# Patient Record
Sex: Male | Born: 1960 | Race: White | Hispanic: No | Smoking: Former smoker
Health system: Southern US, Community
[De-identification: ages and names within clinical notes are randomized; demographics above are authoritative.]

## PROBLEM LIST (undated history)

## (undated) DIAGNOSIS — K420 Umbilical hernia with obstruction, without gangrene: Secondary | ICD-10-CM

## (undated) DIAGNOSIS — K219 Gastro-esophageal reflux disease without esophagitis: Secondary | ICD-10-CM

## (undated) DIAGNOSIS — I509 Heart failure, unspecified: Secondary | ICD-10-CM

## (undated) DIAGNOSIS — K5790 Diverticulosis of intestine, part unspecified, without perforation or abscess without bleeding: Secondary | ICD-10-CM

## (undated) DIAGNOSIS — I10 Essential (primary) hypertension: Secondary | ICD-10-CM

## (undated) DIAGNOSIS — K259 Gastric ulcer, unspecified as acute or chronic, without hemorrhage or perforation: Secondary | ICD-10-CM

## (undated) DIAGNOSIS — I5022 Chronic systolic (congestive) heart failure: Secondary | ICD-10-CM

## (undated) DIAGNOSIS — Z8719 Personal history of other diseases of the digestive system: Secondary | ICD-10-CM

## (undated) HISTORY — PX: TONSILLECTOMY: SUR1361

## (undated) HISTORY — PX: BACK SURGERY: SHX140

## (undated) HISTORY — DX: Umbilical hernia with obstruction, without gangrene: K42.0

---

## 2000-05-30 HISTORY — PX: HERNIA REPAIR: SHX51

## 2011-07-07 DIAGNOSIS — K219 Gastro-esophageal reflux disease without esophagitis: Secondary | ICD-10-CM | POA: Insufficient documentation

## 2011-07-07 DIAGNOSIS — M5440 Lumbago with sciatica, unspecified side: Secondary | ICD-10-CM | POA: Insufficient documentation

## 2011-07-07 DIAGNOSIS — K449 Diaphragmatic hernia without obstruction or gangrene: Secondary | ICD-10-CM | POA: Insufficient documentation

## 2011-07-08 DIAGNOSIS — I10 Essential (primary) hypertension: Secondary | ICD-10-CM | POA: Insufficient documentation

## 2014-03-03 DIAGNOSIS — K5730 Diverticulosis of large intestine without perforation or abscess without bleeding: Secondary | ICD-10-CM | POA: Insufficient documentation

## 2014-03-03 DIAGNOSIS — F1010 Alcohol abuse, uncomplicated: Secondary | ICD-10-CM | POA: Insufficient documentation

## 2014-03-03 DIAGNOSIS — G4700 Insomnia, unspecified: Secondary | ICD-10-CM | POA: Insufficient documentation

## 2014-03-31 DIAGNOSIS — N401 Enlarged prostate with lower urinary tract symptoms: Secondary | ICD-10-CM | POA: Insufficient documentation

## 2014-03-31 DIAGNOSIS — N138 Other obstructive and reflux uropathy: Secondary | ICD-10-CM | POA: Insufficient documentation

## 2014-05-06 DIAGNOSIS — E782 Mixed hyperlipidemia: Secondary | ICD-10-CM | POA: Insufficient documentation

## 2015-01-02 ENCOUNTER — Emergency Department
Admission: EM | Admit: 2015-01-02 | Discharge: 2015-01-02 | Disposition: A | Discharge status: Home / Self Care | Payer: Worker's Compensation | Attending: Emergency Medicine | Admitting: Emergency Medicine

## 2015-01-02 DIAGNOSIS — Y9389 Activity, other specified: Secondary | ICD-10-CM | POA: Diagnosis not present

## 2015-01-02 DIAGNOSIS — Y9289 Other specified places as the place of occurrence of the external cause: Secondary | ICD-10-CM | POA: Diagnosis not present

## 2015-01-02 DIAGNOSIS — Y990 Civilian activity done for income or pay: Secondary | ICD-10-CM | POA: Diagnosis not present

## 2015-01-02 DIAGNOSIS — X58XXXA Exposure to other specified factors, initial encounter: Secondary | ICD-10-CM | POA: Insufficient documentation

## 2015-01-02 DIAGNOSIS — S61422A Laceration with foreign body of left hand, initial encounter: Secondary | ICD-10-CM | POA: Insufficient documentation

## 2015-01-02 DIAGNOSIS — S61412A Laceration without foreign body of left hand, initial encounter: Secondary | ICD-10-CM

## 2015-01-02 MED ORDER — CEPHALEXIN 500 MG PO CAPS: 500.0000 mg | ORAL_CAPSULE | Freq: Two times a day (BID) | ORAL | Status: AC

## 2015-01-02 MED ORDER — BACITRACIN 500 UNIT/GM EX OINT
1.0000 "application " | TOPICAL_OINTMENT | Freq: Two times a day (BID) | CUTANEOUS | Status: DC
  Administered 2015-01-02: 1 via TOPICAL

## 2015-01-02 MED ORDER — CEPHALEXIN 500 MG PO CAPS
500.0000 mg | ORAL_CAPSULE | Freq: Once | ORAL | Status: AC
  Administered 2015-01-02: 500 mg via ORAL

## 2015-01-02 MED ORDER — CEPHALEXIN 500 MG PO CAPS
ORAL_CAPSULE | ORAL | Status: AC
  Administered 2015-01-02: 500 mg via ORAL
  Filled 2015-01-02: qty 1

## 2015-01-02 MED ORDER — OXYCODONE-ACETAMINOPHEN 5-325 MG PO TABS
ORAL_TABLET | ORAL | Status: AC
  Administered 2015-01-02: 1 via ORAL
  Filled 2015-01-02: qty 1

## 2015-01-02 MED ORDER — OXYCODONE-ACETAMINOPHEN 5-325 MG PO TABS
1.0000 | ORAL_TABLET | Freq: Once | ORAL | Status: AC
  Administered 2015-01-02: 1 via ORAL

## 2015-01-02 MED ORDER — LIDOCAINE HCL (PF) 1 % IJ SOLN
5.0000 mL | Freq: Once | INTRAMUSCULAR | Status: AC
  Administered 2015-01-02: 5 mL via INTRADERMAL

## 2015-01-02 MED ORDER — LIDOCAINE HCL (PF) 1 % IJ SOLN
INTRAMUSCULAR | Status: AC
  Administered 2015-01-02: 5 mL via INTRADERMAL
  Filled 2015-01-02: qty 5

## 2015-01-02 MED ORDER — BACITRACIN ZINC 500 UNIT/GM EX OINT
TOPICAL_OINTMENT | CUTANEOUS | Status: AC
  Administered 2015-01-02: 1 via TOPICAL
  Filled 2015-01-02: qty 0.9

## 2015-01-02 NOTE — ED Provider Notes (Signed)
[facility] Emergency Department Provider Note  ____________________________________________  Time seen: 2:00 AM  I have reviewed the triage vital signs and the nursing notes.   HISTORY  Chief Complaint Extremity Laceration      HPI Luis Porter is a 54 y.o. male resents with history of accidental laceration to the dorsal aspect of the left hand while at work. Patient states last tetanus shot was approximately 5 years ago    Past medical history None There are no active problems to display for this patient.   Past surgical history None  No current outpatient prescriptions on file.  Allergies No known drug allergies No family history on file.  Social History History  Substance Use Topics  . Smoking status: Not on file  . Smokeless tobacco: Not on file  . Alcohol Use: Not on file    Review of Systems  Constitutional: Negative for fever. Eyes: Negative for visual changes. ENT: Negative for sore throat. Cardiovascular: Negative for chest pain. Respiratory: Negative for shortness of breath. Gastrointestinal: Negative for abdominal pain, vomiting and diarrhea. Genitourinary: Negative for dysuria. Musculoskeletal: Negative for back pain. Skin: Positive for left hand laceration. Neurological: Negative for headaches, focal weakness or numbness.   10-point ROS otherwise negative.  ____________________________________________   PHYSICAL EXAM:  VITAL SIGNS: ED Triage Vitals  Enc Vitals Group     BP 01/02/15 0126 131/93 mmHg     Pulse Rate 01/02/15 0126 83     Resp 01/02/15 0126 18     Temp 01/02/15 0126 98.2 F (36.8 C)     Temp Source 01/02/15 0126 Oral     SpO2 01/02/15 0126 98 %     Weight 01/02/15 0126 150 lb (68.04 kg)     Height 01/02/15 0126 5\' 6"  (1.676 m)     Head Cir --      Peak Flow --      Pain Score 01/02/15 0127 5     Pain Loc --      Pain Edu? --      Excl. in [place]? --     Constitutional: Alert and  oriented. Well appearing and in no distress. Eyes: Conjunctivae are normal. PERRL. Normal extraocular movements. ENT   Head: Normocephalic and atraumatic.   Nose: No congestion/rhinnorhea.   Mouth/Throat: Mucous membranes are moist.   Neck: No stridor. Hematological/Lymphatic/Immunilogical: No cervical lymphadenopathy. Cardiovascular: Normal rate, regular rhythm. Normal and symmetric distal pulses are present in all extremities. No murmurs, rubs, or gallops. Respiratory: Normal respiratory effort without tachypnea nor retractions. Breath sounds are clear and equal bilaterally. No wheezes/rales/rhonchi. Gastrointestinal: Soft and nontender. No distention. There is no CVA tenderness. Genitourinary: deferred Musculoskeletal: Nontender with normal range of motion in all extremities. No joint effusions.  No lower extremity tenderness nor edema. Neurologic:  Normal speech and language. No gross focal neurologic deficits are appreciated. Speech is normal.  Skin:  2.5 cm linear laceration to the dorsal aspect of the left hand with active bleeding Psychiatric: Mood and affect are normal. Speech and behavior are normal. Patient exhibits appropriate insight and judgment.   PROCEDURES  Procedure(s) performed: LACERATION REPAIR Performed by: Gregor Hams Authorized by: Gregor Hams Consent: Verbal consent obtained. Risks and benefits: risks, benefits and alternatives were discussed Consent given by: patient Patient identity confirmed: provided demographic data Prepped and Draped in normal sterile fashion Wound explored  Laceration Location: DORSAL ASPECT OF THE LEFT HAND   Laceration Length: 2.5 cm  No Foreign Bodies seen or palpated  Anesthesia: local infiltration  Local anesthetic: lidocaine 1 %   Anesthetic total: 40ml  Irrigation method: syringe Amount of cleaning: standard  Skin closure: 5-0 nylon   Number of sutures: 4  Technique: Simple interrupted    Patient tolerance: Patient tolerated the procedure well with no immediate complications.    ____________________________________________   INITIAL IMPRESSION / ASSESSMENT AND PLAN / ED COURSE  Pertinent labs & imaging results that were available during my care of the patient were reviewed by me and considered in my medical decision making (see chart for details).    ____________________________________________   FINAL CLINICAL IMPRESSION(S) / ED DIAGNOSES  Final diagnoses:  Hand laceration, left, initial encounter      Gregor Hams, MD 01/02/15 [phone]

## 2015-01-02 NOTE — ED Notes (Signed)
Workman's comp completed by following employer workman's comp profile, urine specimen placed in [facility] basket for pickup.

## 2015-01-02 NOTE — ED Notes (Signed)
Pt to ED from work c/o laceration to dorsal side of L hand. Pt presents with bandage to affected area. Pt is currently applying pressure to prevent further bleeding. Last tetanus was approximately 5 years ago.

## 2015-01-02 NOTE — Discharge Instructions (Signed)
Laceration Care, Adult A laceration is a cut that goes through all layers of the skin. The cut goes into the tissue beneath the skin. HOME CARE For stitches (sutures) or staples:  Keep the cut clean and dry.  If you have a bandage (dressing), change it at least once a day. Change the bandage if it gets wet or dirty, or as told by your doctor.  Wash the cut with soap and water 2 times a day. Rinse the cut with water. Pat it dry with a clean towel.  Put a thin layer of medicated cream on the cut as told by your doctor.  You may shower after the first 24 hours. Do not soak the cut in water until the stitches are removed.  Only take medicines as told by your doctor.  Have your stitches or staples removed as told by your doctor. For skin adhesive strips:  Keep the cut clean and dry.  Do not get the strips wet. You may take a bath, but be careful to keep the cut dry.  If the cut gets wet, pat it dry with a clean towel.  The strips will fall off on their own. Do not remove the strips that are still stuck to the cut. For wound glue:  You may shower or take baths. Do not soak or scrub the cut. Do not swim. Avoid heavy sweating until the glue falls off on its own. After a shower or bath, pat the cut dry with a clean towel.  Do not put medicine on your cut until the glue falls off.  If you have a bandage, do not put tape over the glue.  Avoid lots of sunlight or tanning lamps until the glue falls off. Put sunscreen on the cut for the first year to reduce your scar.  The glue will fall off on its own. Do not pick at the glue. You may need a tetanus shot if:  You cannot remember when you had your last tetanus shot.  You have never had a tetanus shot. If you need a tetanus shot and you choose not to have one, you may get tetanus. Sickness from tetanus can be serious. GET HELP RIGHT AWAY IF:   Your pain does not get better with medicine.  Your arm, hand, leg, or foot loses feeling  (numbness) or changes color.  Your cut is bleeding.  Your joint feels weak, or you cannot use your joint.  You have painful lumps on your body.  Your cut is red, puffy (swollen), or painful.  You have a red line on the skin near the cut.  You have yellowish-white fluid (pus) coming from the cut.  You have a fever.  You have a bad smell coming from the cut or bandage.  Your cut breaks open before or after stitches are removed.  You notice something coming out of the cut, such as wood or glass.  You cannot move a finger or toe. MAKE SURE YOU:   Understand these instructions.  Will watch your condition.  Will get help right away if you are not doing well or get worse. Document Released: 11/02/2007 Document Revised: 08/08/2011 Document Reviewed: 11/09/2010 [facility] Patient Information 2015 [place], [place]. This information is not intended to replace advice given to you by your health care provider. Make sure you discuss any questions you have with your health care provider. Please have sutures removed in 7 days

## 2015-01-02 NOTE — ED Notes (Signed)
Ice pack applied for 39mins then will drsg and apply bacitrican

## 2016-03-06 ENCOUNTER — Emergency Department
Admission: EM | Admit: 2016-03-06 | Discharge: 2016-03-06 | Disposition: A | Discharge status: Home / Self Care | Payer: Managed Care, Other (non HMO) | Attending: Emergency Medicine | Admitting: Emergency Medicine

## 2016-03-06 ENCOUNTER — Encounter: Payer: Self-pay | Admitting: Emergency Medicine

## 2016-03-06 DIAGNOSIS — I10 Essential (primary) hypertension: Secondary | ICD-10-CM | POA: Insufficient documentation

## 2016-03-06 DIAGNOSIS — M5431 Sciatica, right side: Secondary | ICD-10-CM

## 2016-03-06 DIAGNOSIS — M5441 Lumbago with sciatica, right side: Secondary | ICD-10-CM | POA: Insufficient documentation

## 2016-03-06 DIAGNOSIS — M79661 Pain in right lower leg: Secondary | ICD-10-CM | POA: Diagnosis present

## 2016-03-06 HISTORY — DX: Essential (primary) hypertension: I10

## 2016-03-06 MED ORDER — PREDNISONE 10 MG PO TABS: ORAL_TABLET | ORAL | 0 refills | Status: DC

## 2016-03-06 MED ORDER — HYDROCODONE-ACETAMINOPHEN 5-325 MG PO TABS: 1.0000 | ORAL_TABLET | ORAL | 0 refills | Status: DC | PRN

## 2016-03-06 NOTE — ED Provider Notes (Signed)
[facility] Emergency Department Provider Note  ____________________________________________   First MD Initiated Contact with Patient 03/06/16 [phone]     (approximate)  I have reviewed the triage vital signs and the nursing notes.   HISTORY  Chief Complaint Leg Pain   HPI Luis Porter is a 55 y.o. male comes in today with complaint of right leg pain. Patient states that he has pain shooting from his lower back into his right hip and then down his right leg. Patient continues to be ambulatory but states that the pain is getting worse. He states that he has experienced something like this before. He denies any urinary symptoms, incontinence of bowel or bladder, or saddle paresthesias. Currently patient is not taking any over-the-counter medication for his back pain. He rates his pain as a 10 over 10 at this time.   Past Medical History:  Diagnosis Date  . Hypertension     There are no active problems to display for this patient.   Past Surgical History:  Procedure Laterality Date  . HERNIA REPAIR      Prior to Admission medications   Medication Sig Start Date End Date Taking? Authorizing Provider  HYDROcodone-acetaminophen (NORCO/VICODIN) 5-325 MG tablet Take 1 tablet by mouth every 4 (four) hours as needed for moderate pain. 03/06/16   Johnn Hai, PA-C  predniSONE (DELTASONE) 10 MG tablet Take 6 tablets  today, on day 2 take 5 tablets, day 3 take 4 tablets, day 4 take 3 tablets, day 5 take  2 tablets and 1 tablet the last day 03/06/16   Johnn Hai, PA-C    Allergies Review of patient's allergies indicates no known allergies.  No family history on file.  Social History Social History  Substance Use Topics  . Smoking status: Not on file  . Smokeless tobacco: Not on file  . Alcohol use Not on file    Review of Systems Constitutional: No fever/chills Cardiovascular: Denies chest pain. Respiratory: Denies shortness of  breath. Gastrointestinal: No abdominal pain.  No nausea, no vomiting.   Genitourinary: Negative for dysuria. Musculoskeletal: Positive low back pain, right hip pain, right leg discomfort. Skin: Negative for rash. Neurological: Negative for headaches, focal weakness. Positive for radiculopathy right leg.  10-point ROS otherwise negative.  ____________________________________________   PHYSICAL EXAM:  VITAL SIGNS: ED Triage Vitals [03/06/16 0743]  Enc Vitals Group     BP (!) 166/85     Pulse Rate (!) 120     Resp 18     Temp 98 F (36.7 C)     Temp Source Oral     SpO2 100 %     Weight 150 lb (68 kg)     Height 5\' 6"  (1.676 m)     Head Circumference      Peak Flow      Pain Score 10     Pain Loc      Pain Edu?      Excl. in [place]?     Constitutional: Alert and oriented. Well appearing and in no acute distress. Eyes: Conjunctivae are normal. PERRL. EOMI. Head: Atraumatic. Nose: No congestion/rhinnorhea. Neck: No stridor.   Cardiovascular: Normal rate, regular rhythm. Grossly normal heart sounds.  Good peripheral circulation. Respiratory: Normal respiratory effort.  No retractions. Lungs CTAB. Gastrointestinal: Soft and nontender. No distention.  Musculoskeletal: On examination of the back there is no gross deformity. There is no tenderness  of the thorax or lumbosacral spine to palpation. There is no restriction  on range of motion. There is no muscle spasm seen. Straight leg raises were slightly decreased on the right leg secondary to patient's pain. Muscle strength is equal bilaterally. Neurologic:  Normal speech and language. No gross focal neurologic deficits are appreciated. No gait instability. 2+ bilaterally. Skin:  Skin is warm, dry and intact. No rash noted. Psychiatric: Mood and affect are normal. Speech and behavior are normal.  ____________________________________________   LABS (all labs ordered are listed, but only abnormal results are displayed)  Labs  Reviewed - No data to display   PROCEDURES  Procedure(s) performed: None  Procedures  Critical Care performed: No  ____________________________________________   INITIAL IMPRESSION / ASSESSMENT AND PLAN / ED COURSE  Pertinent labs & imaging results that were available during my care of the patient were reviewed by me and considered in my medical decision making (see chart for details).    Clinical Course  Patient was started on prednisone 60 mg for 6 day taper. Patient is follow-up with [facility] primary if any continued problems and further evaluation of his back.   ____________________________________________   FINAL CLINICAL IMPRESSION(S) / ED DIAGNOSES  Final diagnoses:  Sciatica of right side      NEW MEDICATIONS STARTED DURING THIS VISIT:  Discharge Medication List as of 03/06/2016  8:08 AM    START taking these medications   Details  predniSONE (DELTASONE) 10 MG tablet Take 6 tablets  today, on day 2 take 5 tablets, day 3 take 4 tablets, day 4 take 3 tablets, day 5 take  2 tablets and 1 tablet the last day, Print         Note:  This document was prepared using Dragon voice recognition software and may include unintentional dictation errors.    Johnn Hai, PA-C 03/06/16 Dawn Malinda, MD 03/10/16 [phone]

## 2016-03-06 NOTE — ED Notes (Signed)
NAD noted at time of D/C. Pt denies questions or concerns. Pt ambulatory to the lobby at this time. This RN discussed with patient no drinking, or driving while on the narcotic pain medication, pt states understanding at this time.

## 2016-03-06 NOTE — ED Triage Notes (Signed)
Pt presents to ED with reports of right leg pain that starts at the hip and shoots down his leg. Pt ambulated to triage without difficulty.

## 2016-03-06 NOTE — Discharge Instructions (Signed)
Begin taking prednisone daily as directed. You may also take Tylenol with this medication for pain. Follow-up with your primary care doctor or Dr. Rudene Christians if any continued problems.

## 2016-03-29 ENCOUNTER — Other Ambulatory Visit: Payer: Self-pay | Admitting: Orthopedic Surgery

## 2016-03-29 DIAGNOSIS — M5440 Lumbago with sciatica, unspecified side: Secondary | ICD-10-CM

## 2016-04-08 ENCOUNTER — Ambulatory Visit
Admission: RE | Admit: 2016-04-08 | Discharge: 2016-04-08 | Disposition: A | Discharge status: Home / Self Care | Payer: Managed Care, Other (non HMO) | Source: Ambulatory Visit | Attending: Orthopedic Surgery | Admitting: Orthopedic Surgery

## 2016-04-08 DIAGNOSIS — M5440 Lumbago with sciatica, unspecified side: Secondary | ICD-10-CM

## 2016-04-12 ENCOUNTER — Ambulatory Visit
Admission: RE | Admit: 2016-04-12 | Discharge: 2016-04-12 | Disposition: A | Discharge status: Home / Self Care | Payer: Managed Care, Other (non HMO) | Source: Ambulatory Visit | Attending: Orthopedic Surgery | Admitting: Orthopedic Surgery

## 2016-04-12 DIAGNOSIS — M5417 Radiculopathy, lumbosacral region: Secondary | ICD-10-CM | POA: Insufficient documentation

## 2016-04-12 DIAGNOSIS — M5440 Lumbago with sciatica, unspecified side: Secondary | ICD-10-CM | POA: Insufficient documentation

## 2016-04-12 DIAGNOSIS — M48061 Spinal stenosis, lumbar region without neurogenic claudication: Secondary | ICD-10-CM | POA: Diagnosis not present

## 2016-04-12 DIAGNOSIS — M5126 Other intervertebral disc displacement, lumbar region: Secondary | ICD-10-CM | POA: Diagnosis not present

## 2016-04-12 DIAGNOSIS — M2578 Osteophyte, vertebrae: Secondary | ICD-10-CM | POA: Diagnosis not present

## 2016-04-12 DIAGNOSIS — M5136 Other intervertebral disc degeneration, lumbar region: Secondary | ICD-10-CM | POA: Insufficient documentation

## 2016-04-12 IMAGING — MR MR LUMBAR SPINE W/O CM
5 series · 35 of 48 positions shown · non-contrast
Comparison: None.

CLINICAL DATA: 55-year-old male with pain for 2 months radiating to
right leg and great toe. Numbness medial foot. Initial encounter.

EXAM:
MRI LUMBAR SPINE WITHOUT CONTRAST
TECHNIQUE: Multiplanar, multisequence MR imaging of the lumbar spine was
performed. No intravenous contrast was administered.

[Series 2: T2 · sagittal · 4.0mm · 0.81mm/px · 6 of 17 slices shown (1 of 2)]
[im 1/17]
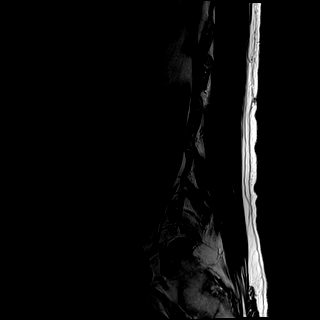
[im 4/17]
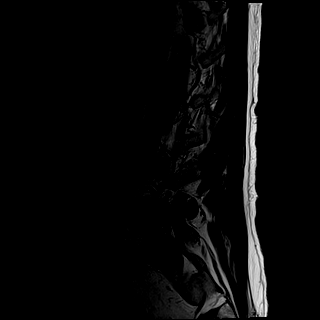
[im 7/17]
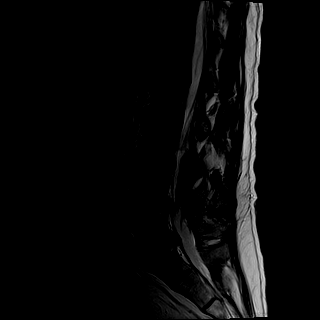
[im 10/17]
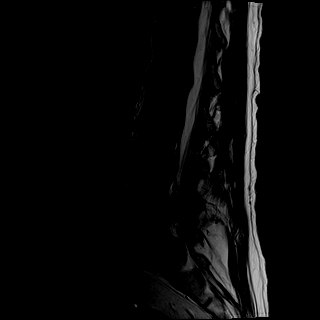
[im 13/17]
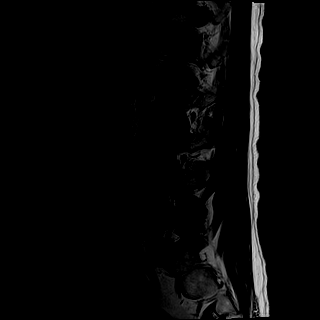
[im 17/17]
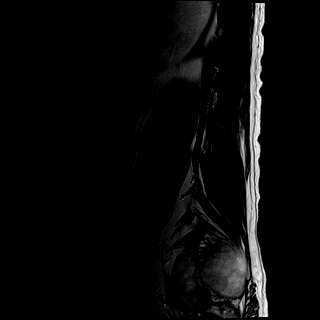

[Series 3: T1 · sagittal · 4.0mm · 0.81mm/px · 6 of 17 slices shown (1 of 2)]
[im 1/17]
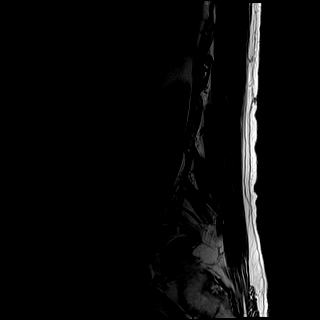
[im 4/17]
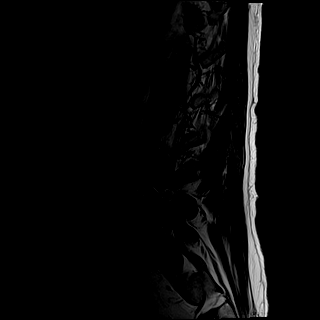
[im 7/17]
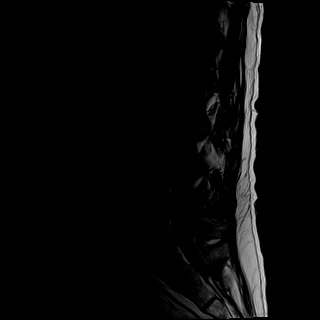
[im 10/17]
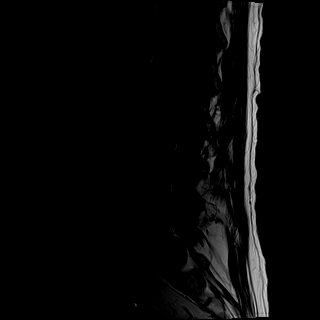
[im 13/17]
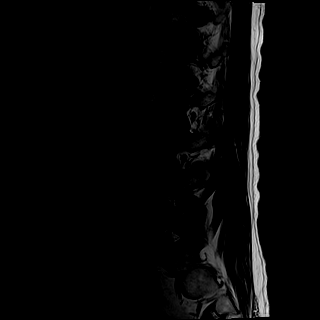
[im 17/17]
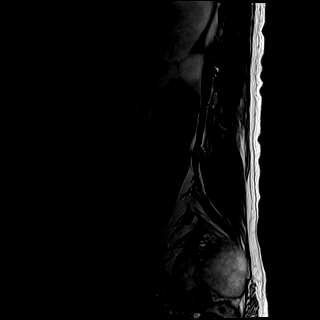

[Series 4: STIR · sagittal · 4.0mm · 1.02mm/px · 5 of 17 slices shown]
[im 1/17]
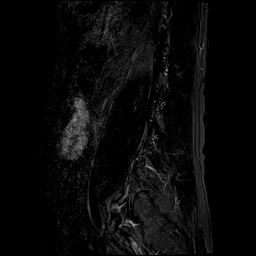
[im 4/17]
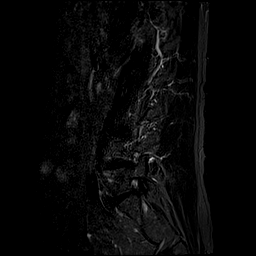
[im 7/17]
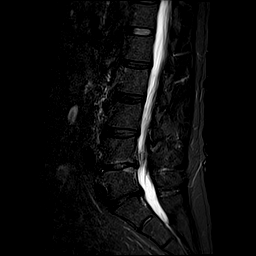
[im 10/17]
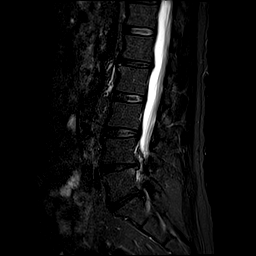
[im 13/17]
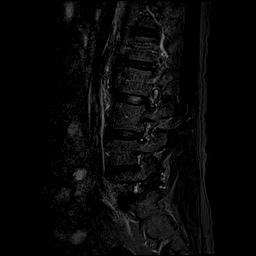

[Series 5: T2 · axial · 4.0mm · 0.78mm/px · z∈[-63,+149]mm · 9 of 39 slices shown (2 of 2)]
[im 1/39]
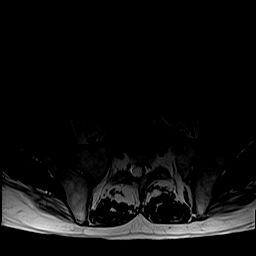
[im 6/39]
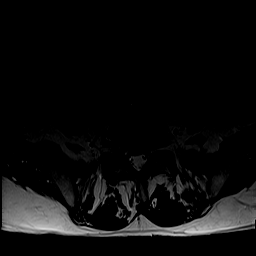
[im 11/39]
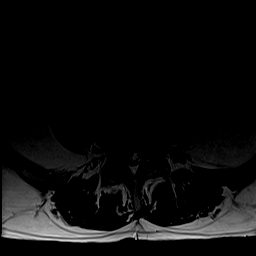
[im 17/39]
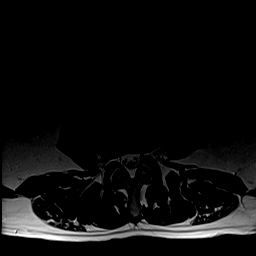
[im 20/39]
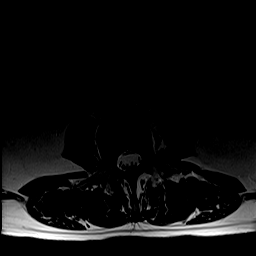
[im 22/39]
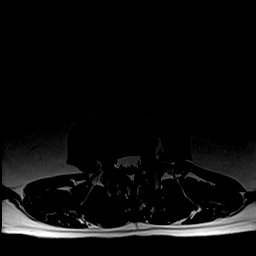
[im 28/39]
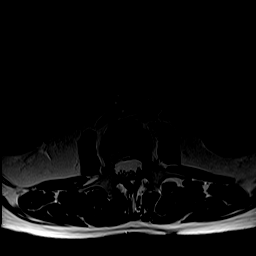
[im 33/39]
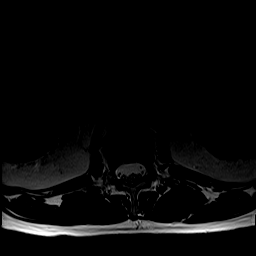
[im 39/39]
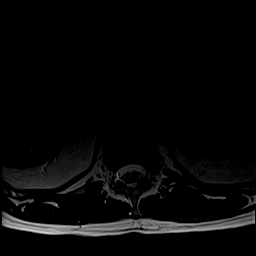

[Series 6: T1 · axial · 4.0mm · 0.39mm/px · z∈[-63,+149]mm · 9 of 39 slices shown (2 of 2)]
[im 1/39]
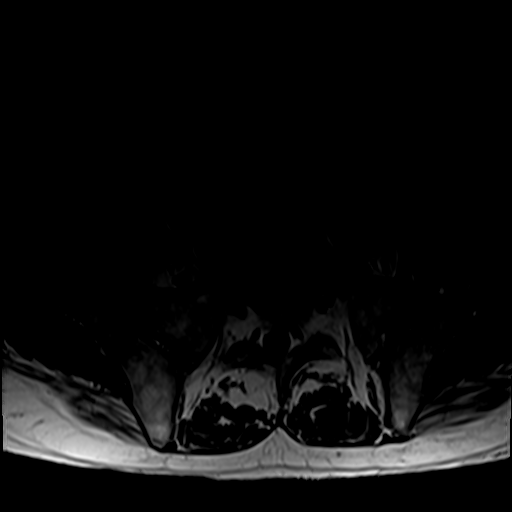
[im 6/39]
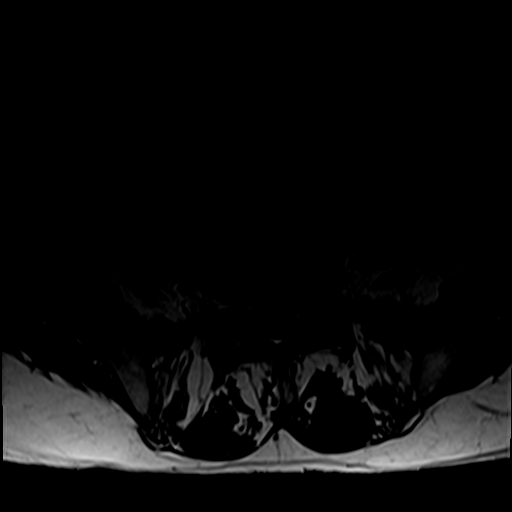
[im 11/39]
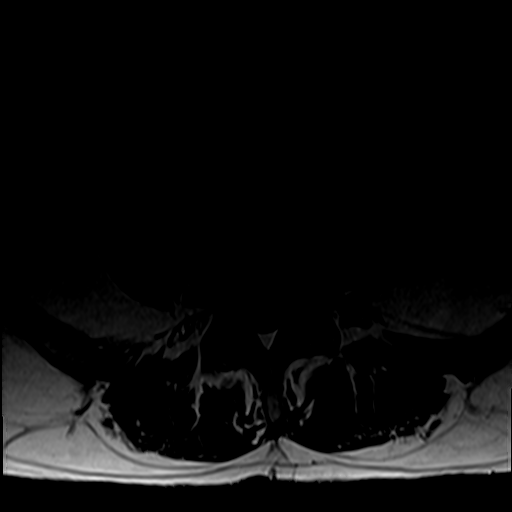
[im 17/39]
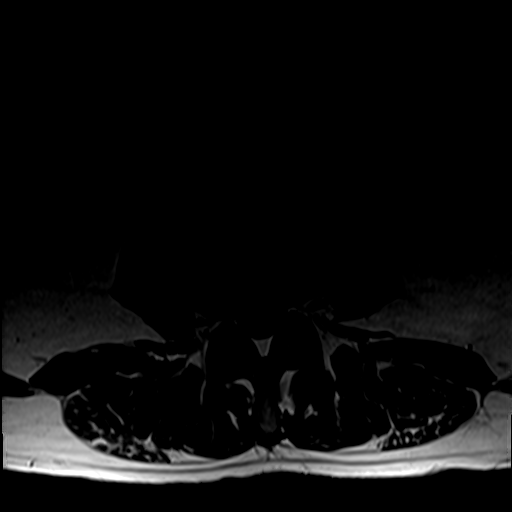
[im 20/39]
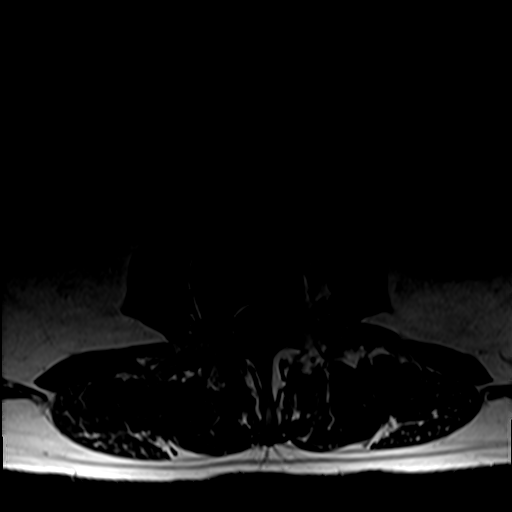
[im 22/39]
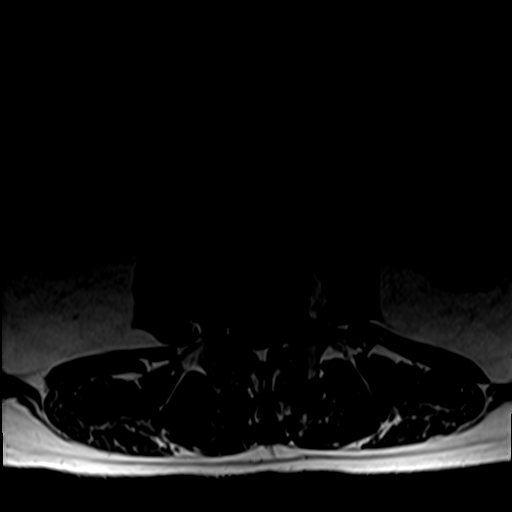
[im 28/39]
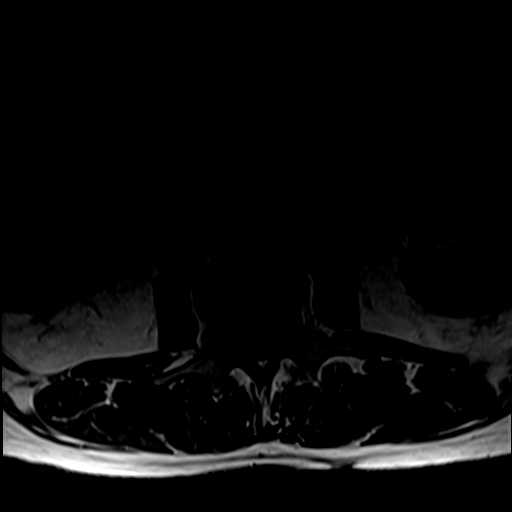
[im 33/39]
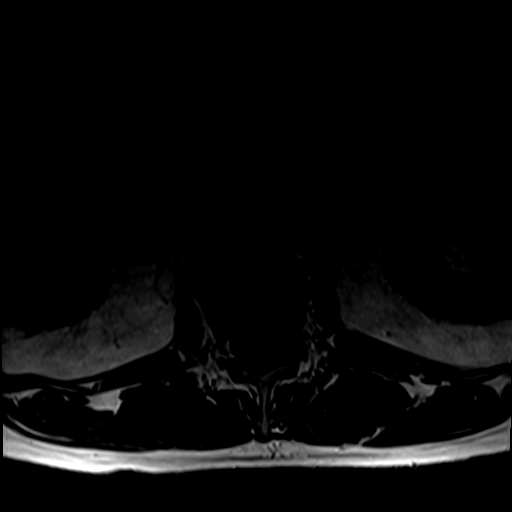
[im 39/39]
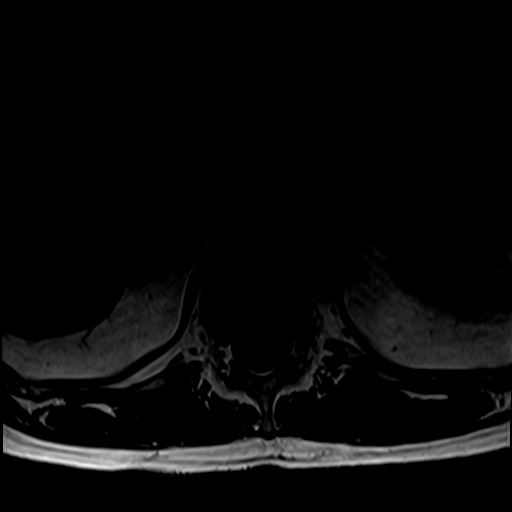

[35 of 48 positions shown; findings below may reference images not displayed]

FINDINGS: Segmentation: Last fully open disk space is labeled L5-S1. Present
examination incorporates from T12 through S3.

Alignment:  Minimal curvature.

Vertebrae:  Endplate reactive changes L4-5 and anterior superior L2.

Conus medullaris: Extends to the L1 level and appears normal.

Paraspinal and other soft tissues: No worrisome abnormality.

Disc levels:

T12-L1:  Negative.

L1-2:  Minimal bulge.  Mild facet degenerative changes.

L2-3: Right lateral shallow protrusion. Mild encroachment upon but
not compression of the exiting right L2 nerve root. Mild facet
degenerative changes.

L3-4:  Mild bulge.  Mild facet degenerative changes.

L4-5: Moderate facet degenerative changes. Disc degeneration with
endplate reactive changes. Dorsal epidural fat. Baseline mild spinal
stenosis. Superimposed large complex right paracentral extruded disc
fragment with predominately caudal extension. Minimal cephalad
extension. Compression of the right lateral aspect of the thecal sac
and contained nerve roots. This reaches the lower L5 pedicle level.

L5-S1: Disc degeneration with broad-based shallow protrusion and
osteophyte having greatest extension right paracentral position with
mild mass effect upon the upper right S1 nerve root and slight
flattening right ventral thecal sac. Minimal contact with the upper
left S1 nerve root. Facet degenerative changes greater on the right.
IMPRESSION: L4-5 baseline multifactorial mild spinal stenosis. Superimposed
large complex right paracentral extruded disc fragment with
predominately caudal extension. Minimal cephalad extension.
Compression of the right lateral aspect of the thecal sac and
contained nerve roots. This reaches the lower L5 pedicle level.

L5-S1 broad-based shallow protrusion and osteophyte having greatest
extension right paracentral position with mild mass effect upon the
upper right S1 nerve root and slight flattening right ventral thecal
sac. Minimal contact with the upper left S1 nerve root.

L2-3 right lateral shallow protrusion. Mild encroachment upon but
not compression of the exiting right L2 nerve root.

These results will be called to the ordering clinician or
representative by the Radiologist Assistant, and communication
documented in the PACS or zVision Dashboard.

## 2016-05-11 ENCOUNTER — Other Ambulatory Visit: Payer: Self-pay

## 2016-05-11 ENCOUNTER — Ambulatory Visit
Admission: RE | Admit: 2016-05-11 | Discharge: 2016-05-11 | Disposition: A | Discharge status: Home / Self Care | Payer: Managed Care, Other (non HMO) | Source: Ambulatory Visit | Attending: Neurological Surgery | Admitting: Neurological Surgery

## 2016-05-11 ENCOUNTER — Encounter
Admission: RE | Admit: 2016-05-11 | Discharge: 2016-05-11 | Disposition: A | Discharge status: Home / Self Care | Payer: Managed Care, Other (non HMO) | Source: Ambulatory Visit | Attending: Neurological Surgery | Admitting: Neurological Surgery

## 2016-05-11 DIAGNOSIS — I10 Essential (primary) hypertension: Secondary | ICD-10-CM

## 2016-05-11 HISTORY — DX: Diverticulosis of intestine, part unspecified, without perforation or abscess without bleeding: K57.90

## 2016-05-11 HISTORY — DX: Gastro-esophageal reflux disease without esophagitis: K21.9

## 2016-05-11 HISTORY — DX: Gastric ulcer, unspecified as acute or chronic, without hemorrhage or perforation: K25.9

## 2016-05-11 HISTORY — DX: Personal history of other diseases of the digestive system: Z87.19

## 2016-05-11 LAB — SURGICAL PCR SCREEN
MRSA, PCR: NEGATIVE
Staphylococcus aureus: NEGATIVE

## 2016-05-11 LAB — BASIC METABOLIC PANEL
Anion gap: 8 (ref 5–15)
BUN: 8 mg/dL (ref 6–20)
CO2: 28 mmol/L (ref 22–32)
Calcium: 9.3 mg/dL (ref 8.9–10.3)
Chloride: 102 mmol/L (ref 101–111)
Creatinine, Ser: 0.7 mg/dL (ref 0.61–1.24)
GFR calc Af Amer: >60 mL/min (ref >60)
GFR calc non Af Amer: >60 mL/min (ref >60)
Glucose, Bld: 90 mg/dL (ref 65–99)
Potassium: 3.2 mmol/L — ABNORMAL LOW (ref 3.5–5.1)
Sodium: 138 mmol/L (ref 135–145)

## 2016-05-11 LAB — URINALYSIS, ROUTINE W REFLEX MICROSCOPIC
Bacteria, UA: NONE SEEN
Bilirubin Urine: NEGATIVE
Glucose, UA: NEGATIVE mg/dL
Ketones, ur: NEGATIVE mg/dL
Leukocytes, UA: NEGATIVE
Nitrite: NEGATIVE
Protein, ur: NEGATIVE mg/dL
Specific Gravity, Urine: 1.015 (ref 1.005–1.030)
Squamous Epithelial / LPF: NONE SEEN
pH: 6 (ref 5.0–8.0)

## 2016-05-11 LAB — CBC
HCT: 47.3 % (ref 40.0–52.0)
Hemoglobin: 16.1 g/dL (ref 13.0–18.0)
MCH: 33 pg (ref 26.0–34.0)
MCHC: 34.1 g/dL (ref 32.0–36.0)
MCV: 96.6 fL (ref 80.0–100.0)
Platelets: 305 10*3/uL (ref 150–440)
RBC: 4.89 MIL/uL (ref 4.40–5.90)
RDW: 14 % (ref 11.5–14.5)
WBC: 12.8 10*3/uL — ABNORMAL HIGH (ref 3.8–10.6)

## 2016-05-11 LAB — PROTIME-INR
INR: 0.87
Prothrombin Time: 11.8 seconds (ref 11.4–15.2)

## 2016-05-11 LAB — APTT: aPTT: 25 seconds (ref 24–36)

## 2016-05-11 IMAGING — CR DG CHEST 2V
1 series · 2 of 2 positions shown · non-contrast
Comparison: None.

CLINICAL DATA: Preop for lumbar spine surgery in 1 week, former
smoking history

EXAM:
CHEST  2 VIEW

[Series 1: w chest pa · 0.14mm/px · 2 of 2 slices shown]
[im 1/2]
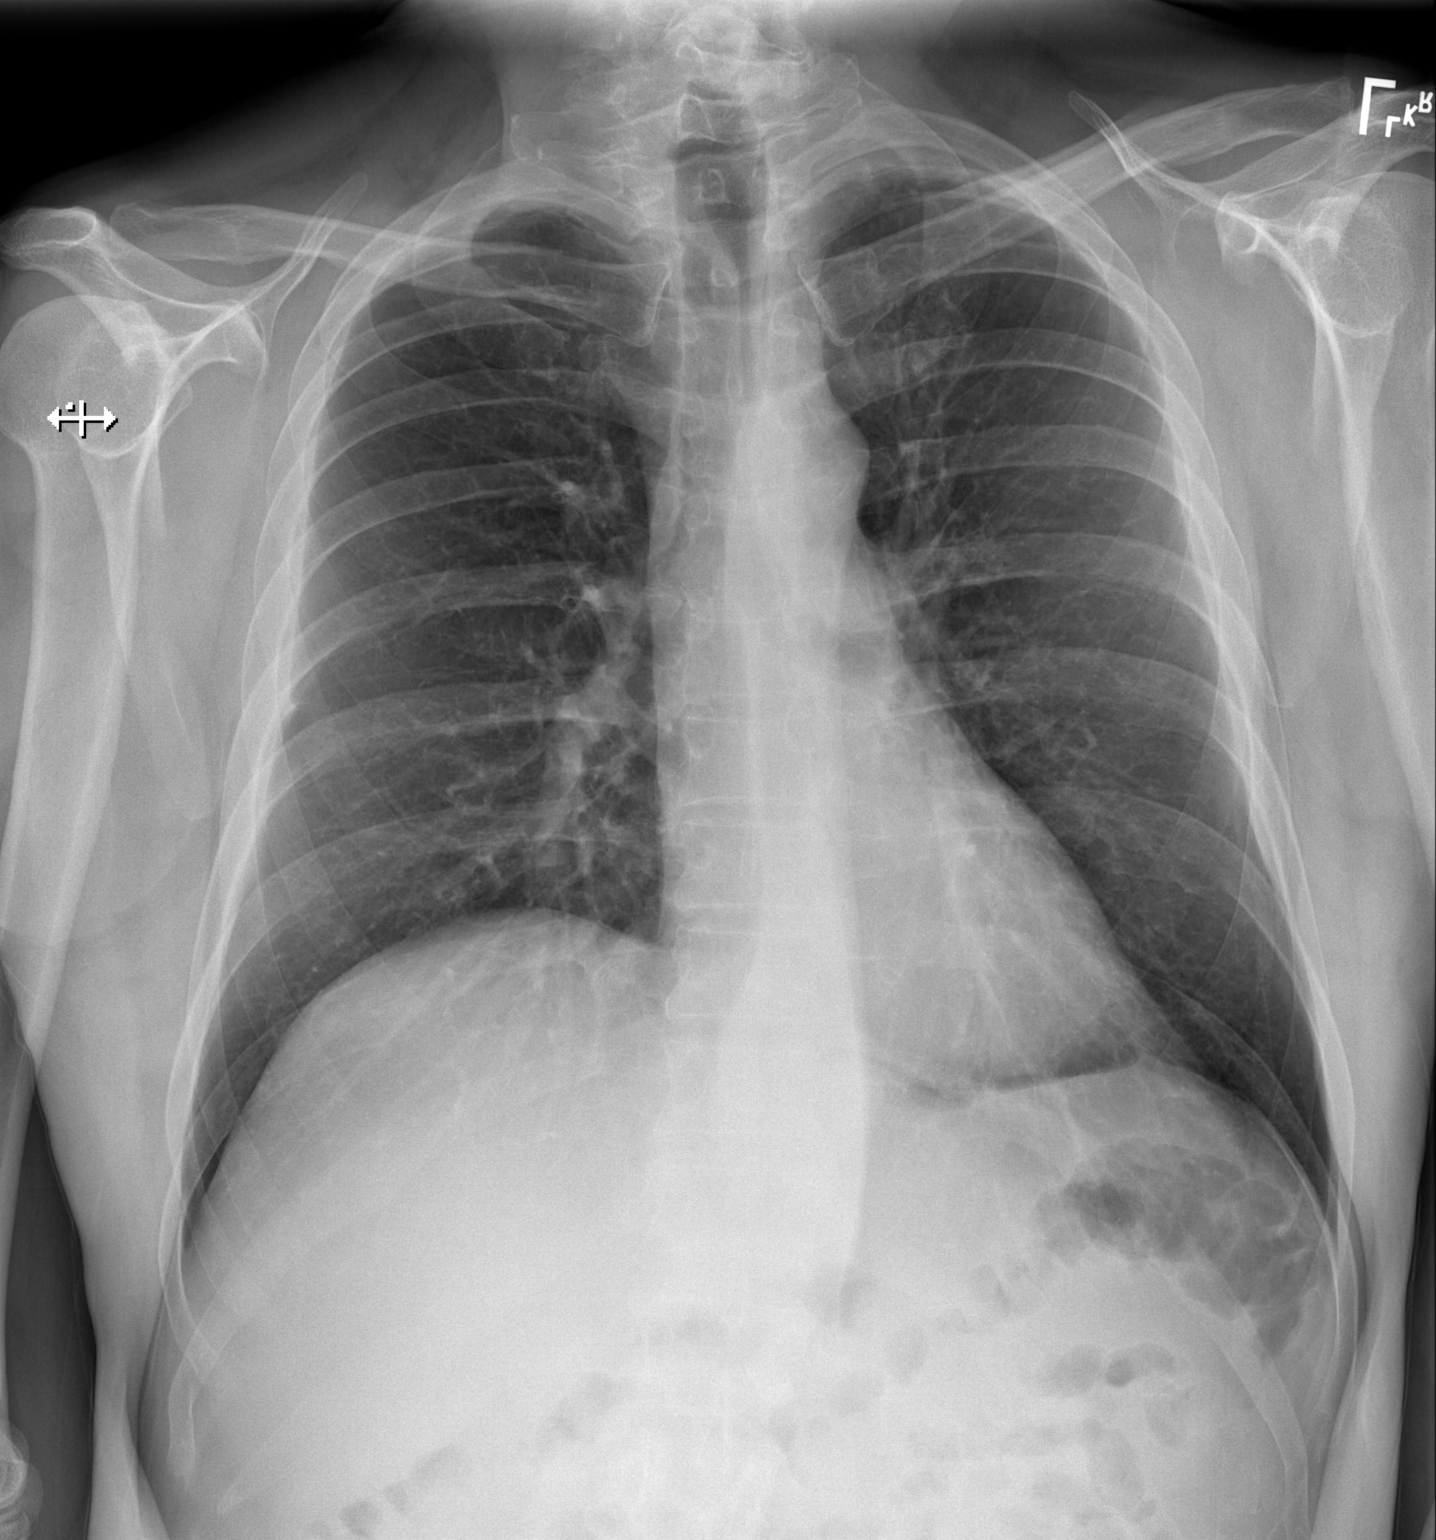
[im 2/2]
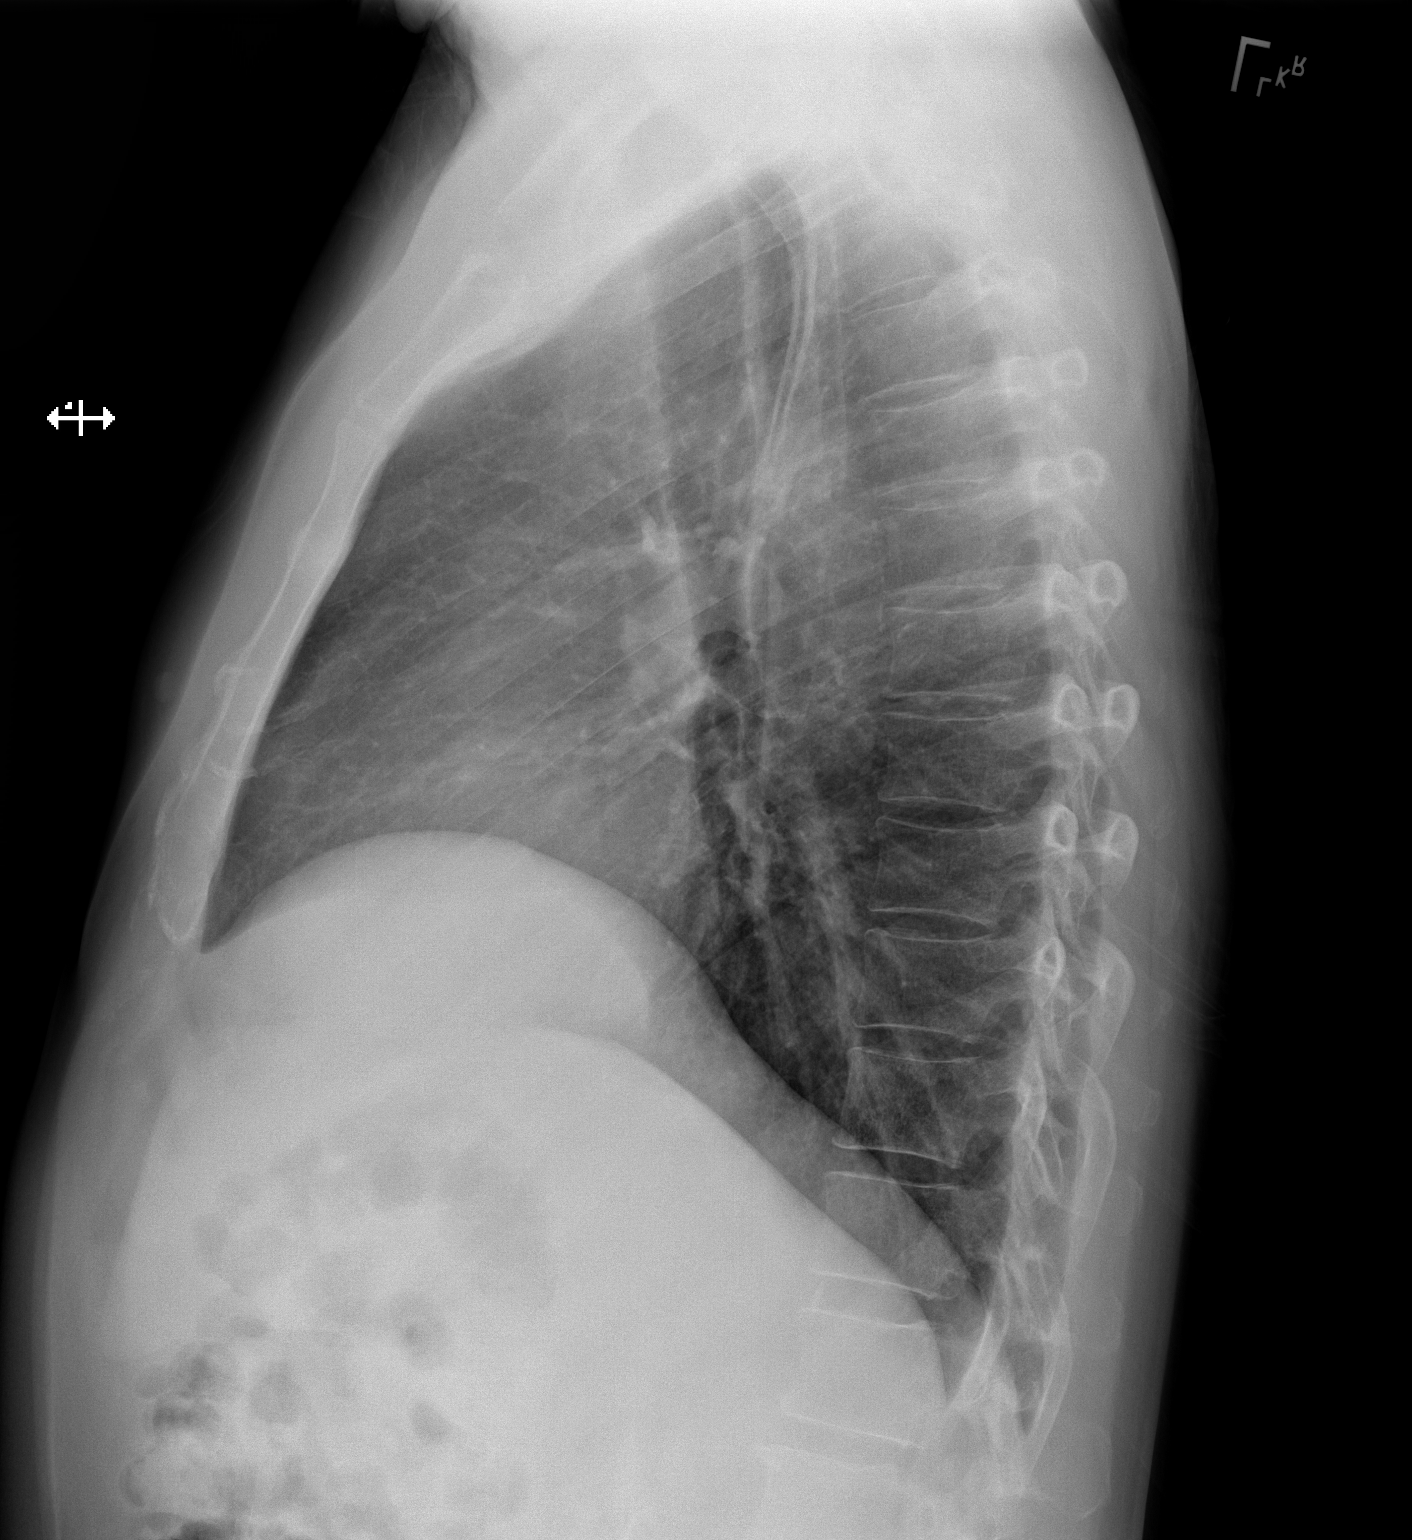

[2 of 2 positions shown; findings below may reference images not displayed]

FINDINGS: No active infiltrate or effusion is seen. There is mild
peribronchial thickening present. Mediastinal and hilar contours are
unremarkable. The heart is within normal limits in size. No bony
abnormality is seen.
IMPRESSION: No active cardiopulmonary disease.

## 2016-05-11 NOTE — Pre-Procedure Instructions (Signed)
Potassium results faxed and called to Dr. Dava Najjar office, left message with Genevie Cheshire

## 2016-05-11 NOTE — Patient Instructions (Signed)
  Your procedure is scheduled on: May 18, 2016 (Wednesday) Report to Same Day Surgery 2nd floor medical mall [facility] Entrance-take elevator on left to 2nd floor.  Check in with surgery information desk.) To find out your arrival time please call [phone] between 1PM - 3PM on  May 17, 2016 (Tuesday)  Remember: Instructions that are not followed completely may result in serious medical risk, up to and including death, or upon the discretion of your surgeon and anesthesiologist your surgery may need to be rescheduled.    _x___ 1. Do not eat food or drink liquids after midnight. No gum chewing or hard candies.     __x__ 2. No Alcohol for 24 hours before or after surgery.   __x__3. No Smoking for 24 prior to surgery.   ____  4. Bring all medications with you on the day of surgery if instructed.    __x__ 5. Notify your doctor if there is any change in your medical condition     (cold, fever, infections).     Do not wear jewelry, make-up, hairpins, clips or nail polish.  Do not wear lotions, powders, or perfumes. You may wear deodorant.  Do not shave 48 hours prior to surgery. Men may shave face and neck.  Do not bring valuables to the hospital.    [facility] is not responsible for any belongings or valuables.               Contacts, dentures or bridgework may not be worn into surgery.  Leave your suitcase in the car. After surgery it may be brought to your room.  For patients admitted to the hospital, discharge time is determined by your treatment team.   Patients discharged the day of surgery will not be allowed to drive home.  You will need someone to drive you home and stay with you the night of your procedure.    Please read over the following fact sheets that you were given:   [facility] Preparing for Surgery and or MRSA Information   _x___ Take these medicines the morning of surgery with A SIP OF WATER:    1.  Amlodipine-Benazepril  2.   Omeprazole  3.  4.  5.  6.  ____Fleets enema or Magnesium Citrate as directed.   _x___ Use CHG Soap or sage wipes as directed on instruction sheet   ____ Use inhalers on the day of surgery and bring to hospital day of surgery  ____ Stop metformin 2 days prior to surgery    ____ Take 1/2 of usual insulin dose the night before surgery and none on the morning of           surgery.   _x__ Stop Aspirin, Coumadin, Pllavix ,Eliquis, Effient, or Pradaxa (NO ASPIRIN)  x__ Stop Anti-inflammatories such as Advil, Aleve, Ibuprofen, Motrin, Naproxen,          Naprosyn, Goodies powders or aspirin products. Ok to take Tylenol.   ____ Stop supplements until after surgery.    ____ Bring C-Pap to the hospital.

## 2016-05-17 NOTE — Pre-Procedure Instructions (Signed)
Faxed request for H&P. 

## 2016-05-18 ENCOUNTER — Ambulatory Visit: Payer: Managed Care, Other (non HMO) | Admitting: Anesthesiology

## 2016-05-18 ENCOUNTER — Ambulatory Visit: Payer: Managed Care, Other (non HMO)

## 2016-05-18 ENCOUNTER — Encounter
Admission: RE | Disposition: A | Discharge status: Home Health | Payer: Self-pay | Source: Ambulatory Visit | Attending: Neurological Surgery

## 2016-05-18 ENCOUNTER — Encounter: Payer: Self-pay | Admitting: *Deleted

## 2016-05-18 ENCOUNTER — Observation Stay
Admission: RE | Admit: 2016-05-18 | Discharge: 2016-05-19 | Disposition: A | Discharge status: Home Health | Payer: Managed Care, Other (non HMO) | Source: Ambulatory Visit | Attending: Neurological Surgery | Admitting: Neurological Surgery

## 2016-05-18 DIAGNOSIS — M48061 Spinal stenosis, lumbar region without neurogenic claudication: Secondary | ICD-10-CM | POA: Diagnosis present

## 2016-05-18 DIAGNOSIS — N400 Enlarged prostate without lower urinary tract symptoms: Secondary | ICD-10-CM | POA: Insufficient documentation

## 2016-05-18 DIAGNOSIS — E785 Hyperlipidemia, unspecified: Secondary | ICD-10-CM | POA: Insufficient documentation

## 2016-05-18 DIAGNOSIS — M6281 Muscle weakness (generalized): Secondary | ICD-10-CM

## 2016-05-18 DIAGNOSIS — R2681 Unsteadiness on feet: Secondary | ICD-10-CM

## 2016-05-18 DIAGNOSIS — M5126 Other intervertebral disc displacement, lumbar region: Secondary | ICD-10-CM | POA: Diagnosis present

## 2016-05-18 DIAGNOSIS — Z419 Encounter for procedure for purposes other than remedying health state, unspecified: Secondary | ICD-10-CM

## 2016-05-18 HISTORY — PX: LUMBAR LAMINECTOMY/DECOMPRESSION MICRODISCECTOMY: SHX5026

## 2016-05-18 LAB — POCT I-STAT 4, (NA,K, GLUC, HGB,HCT)
Glucose, Bld: 86 mg/dL (ref 65–99)
HCT: 43 % (ref 39.0–52.0)
Hemoglobin: 14.6 g/dL (ref 13.0–17.0)
Potassium: 3.8 mmol/L (ref 3.5–5.1)
Sodium: 139 mmol/L (ref 135–145)

## 2016-05-18 LAB — CBC
HCT: 41.1 % (ref 40.0–52.0)
Hemoglobin: 14.5 g/dL (ref 13.0–18.0)
MCH: 33.8 pg (ref 26.0–34.0)
MCHC: 35.2 g/dL (ref 32.0–36.0)
MCV: 96.1 fL (ref 80.0–100.0)
Platelets: 286 10*3/uL (ref 150–440)
RBC: 4.28 MIL/uL — ABNORMAL LOW (ref 4.40–5.90)
RDW: 14.1 % (ref 11.5–14.5)
WBC: 13.8 10*3/uL — ABNORMAL HIGH (ref 3.8–10.6)

## 2016-05-18 LAB — CREATININE, SERUM
Creatinine, Ser: 0.7 mg/dL (ref 0.61–1.24)
GFR calc Af Amer: >60 mL/min (ref >60)
GFR calc non Af Amer: >60 mL/min (ref >60)

## 2016-05-18 IMAGING — XA DG C-ARM 61-120 MIN
1 series · 4 of 4 positions shown · non-contrast
Comparison: Preoperative MRI 04/12/2016

CLINICAL DATA: Spine surgery

EXAM:
LUMBAR SPINE - 2-3 VIEW; DG C-ARM 61-120 MIN

[Series 3: ortho standard · 4 of 9 frames shown]
[frame 2/9]
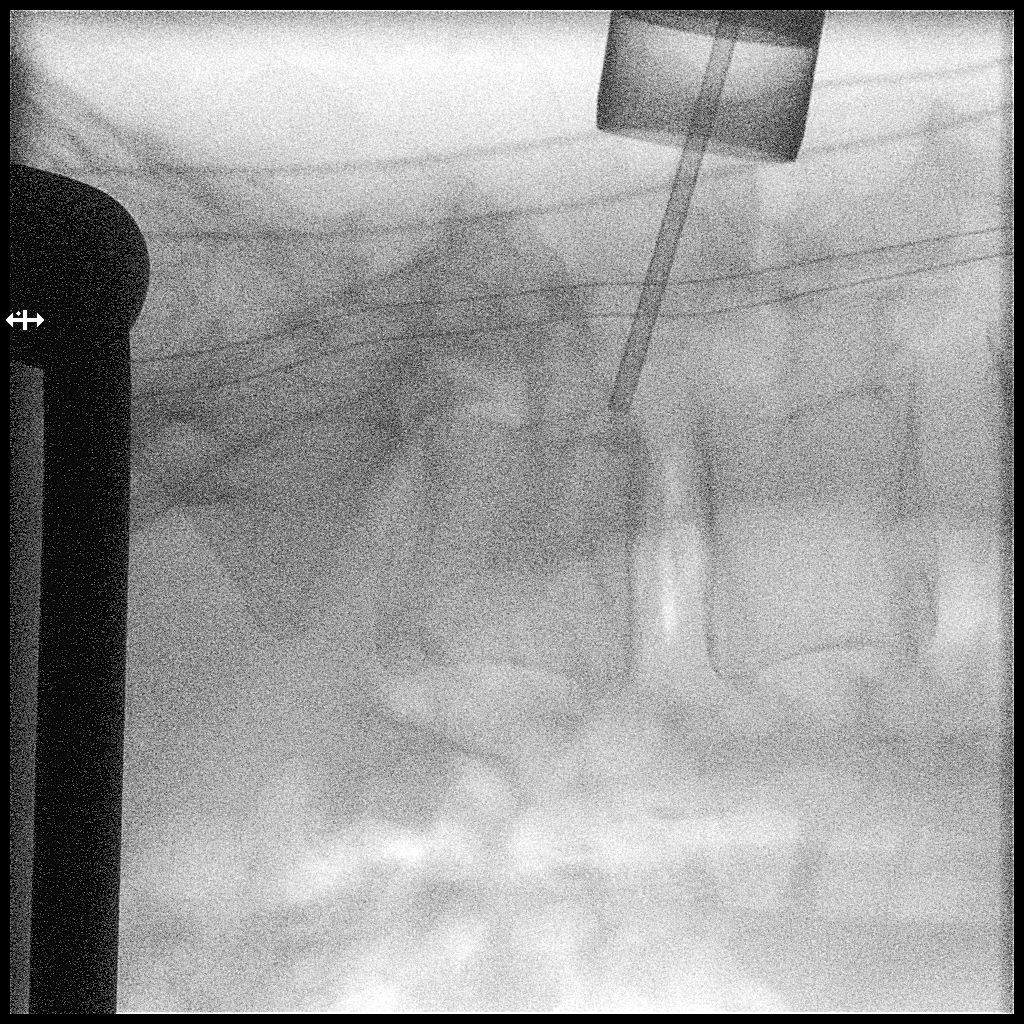
[frame 5/9]
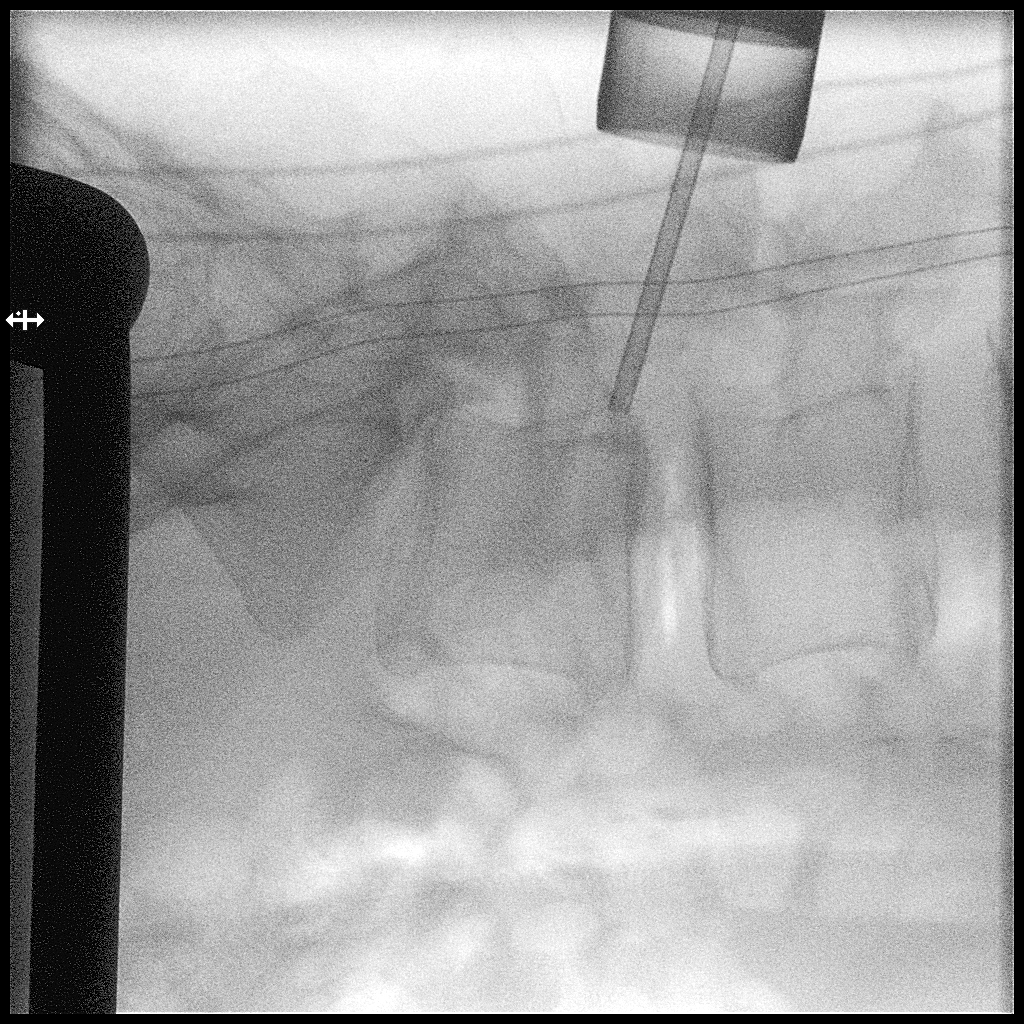
[frame 8/9]
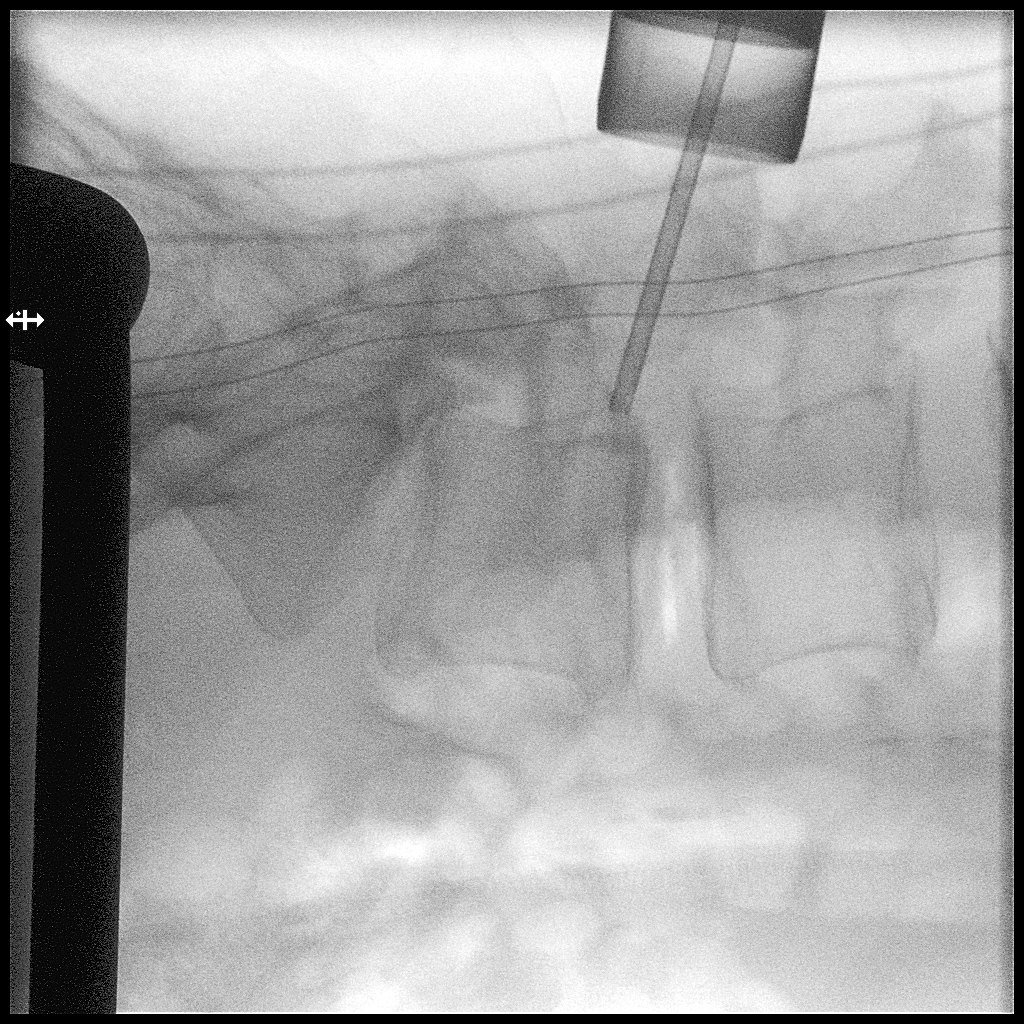
[frame 9/9]
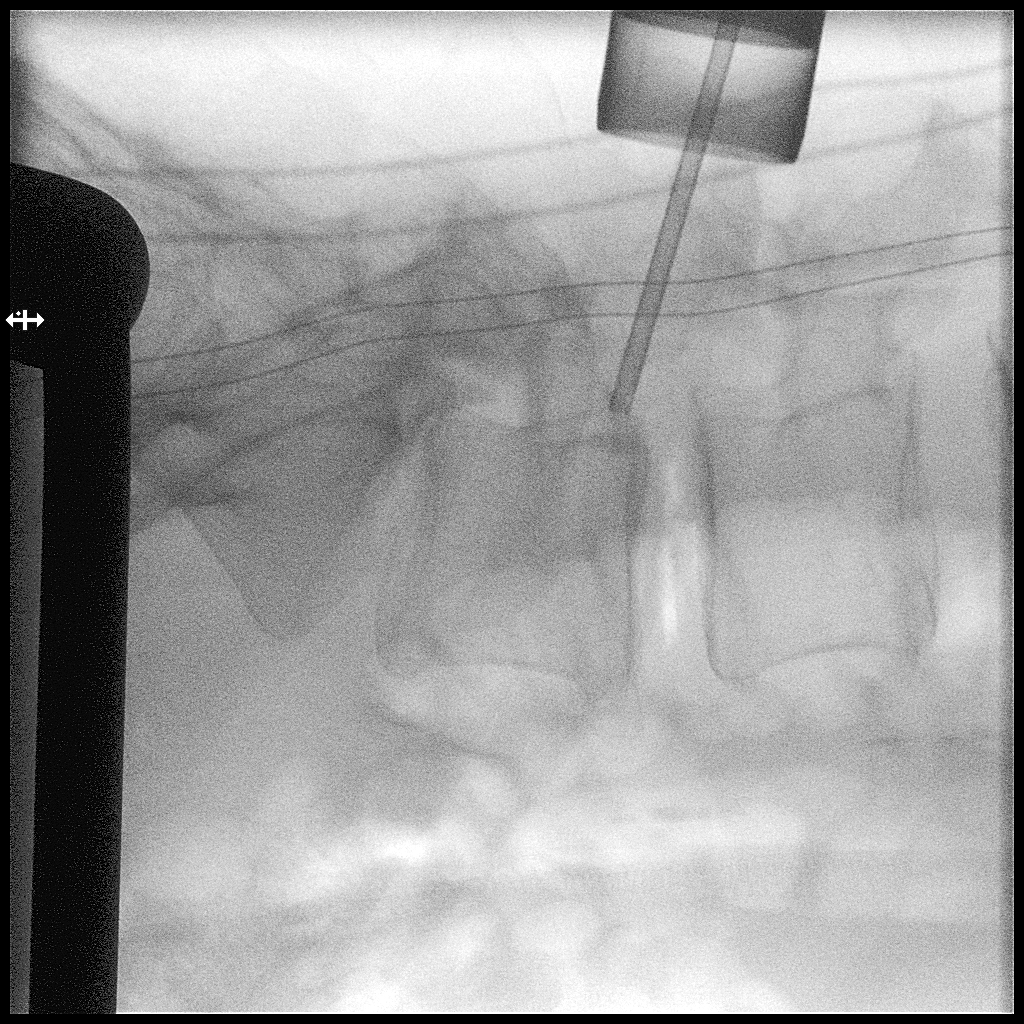

[4 of 4 positions shown; findings below may reference images not displayed]

FINDINGS: Intraoperative localization with probe tip overlapping at the
posterosuperior corner of the L5 vertebra.
IMPRESSION: Fluoroscopy for intraoperative localization.

## 2016-05-18 SURGERY — LUMBAR LAMINECTOMY/DECOMPRESSION MICRODISCECTOMY 1 LEVEL
Anesthesia: General | Site: Spine Lumbar | Laterality: Right | Wound class: Clean

## 2016-05-18 MED ORDER — MENTHOL 3 MG MT LOZG
1.0000 | LOZENGE | OROMUCOSAL | Status: DC | PRN
  Filled 2016-05-18: qty 9

## 2016-05-18 MED ORDER — POTASSIUM CHLORIDE IN NACL 20-0.9 MEQ/L-% IV SOLN
INTRAVENOUS | Status: DC
  Administered 2016-05-18 – 2016-05-19 (×2): via INTRAVENOUS
  Filled 2016-05-18 (×3): qty 1000

## 2016-05-18 MED ORDER — FENTANYL CITRATE (PF) 100 MCG/2ML IJ SOLN
INTRAMUSCULAR | Status: AC
  Administered 2016-05-18: 25 ug via INTRAVENOUS
  Filled 2016-05-18: qty 2

## 2016-05-18 MED ORDER — BUPIVACAINE HCL (PF) 0.5 % IJ SOLN
INTRAMUSCULAR | Status: AC
Start: 2016-05-18 — End: 2016-05-18
  Filled 2016-05-18: qty 30

## 2016-05-18 MED ORDER — OXYCODONE-ACETAMINOPHEN 5-325 MG PO TABS: 1.0000 | ORAL_TABLET | ORAL | Status: DC | PRN

## 2016-05-18 MED ORDER — DIAZEPAM 5 MG PO TABS
5.0000 mg | ORAL_TABLET | Freq: Four times a day (QID) | ORAL | Status: DC | PRN
  Administered 2016-05-19: 5 mg via ORAL
  Filled 2016-05-18: qty 1

## 2016-05-18 MED ORDER — THROMBIN 5000 UNITS EX SOLR
CUTANEOUS | Status: DC | PRN
  Administered 2016-05-18: 5000 [IU] via TOPICAL

## 2016-05-18 MED ORDER — DEXMEDETOMIDINE HCL IN NACL 200 MCG/50ML IV SOLN
INTRAVENOUS | Status: AC
  Filled 2016-05-18: qty 50

## 2016-05-18 MED ORDER — GELATIN ABSORBABLE 12-7 MM EX MISC
CUTANEOUS | Status: AC
  Filled 2016-05-18: qty 1

## 2016-05-18 MED ORDER — LIDOCAINE 2% (20 MG/ML) 5 ML SYRINGE
INTRAMUSCULAR | Status: AC
  Filled 2016-05-18: qty 5

## 2016-05-18 MED ORDER — PROPOFOL 10 MG/ML IV BOLUS
INTRAVENOUS | Status: AC
  Filled 2016-05-18: qty 20

## 2016-05-18 MED ORDER — CEFAZOLIN IN D5W 1 GM/50ML IV SOLN
1.0000 g | Freq: Once | INTRAVENOUS | Status: AC
  Administered 2016-05-18: 1 g via INTRAVENOUS

## 2016-05-18 MED ORDER — GELATIN ABSORBABLE 12-7 MM EX MISC
CUTANEOUS | Status: DC | PRN
  Administered 2016-05-18: 1

## 2016-05-18 MED ORDER — SEVOFLURANE IN SOLN
RESPIRATORY_TRACT | Status: AC
  Filled 2016-05-18: qty 250

## 2016-05-18 MED ORDER — THROMBIN 5000 UNITS EX SOLR
CUTANEOUS | Status: AC
  Filled 2016-05-18: qty 5000

## 2016-05-18 MED ORDER — ROCURONIUM BROMIDE 100 MG/10ML IV SOLN
INTRAVENOUS | Status: DC | PRN
  Administered 2016-05-18: 35 mg via INTRAVENOUS
  Administered 2016-05-18: 5 mg via INTRAVENOUS

## 2016-05-18 MED ORDER — SODIUM CHLORIDE 0.9 % IV SOLN
INTRAVENOUS | Status: DC | PRN
  Administered 2016-05-18: 50 ug via INTRAVENOUS

## 2016-05-18 MED ORDER — BUPIVACAINE LIPOSOME 1.3 % IJ SUSP
INTRAMUSCULAR | Status: AC
  Filled 2016-05-18: qty 20

## 2016-05-18 MED ORDER — SUCCINYLCHOLINE CHLORIDE 200 MG/10ML IV SOSY
PREFILLED_SYRINGE | INTRAVENOUS | Status: AC
  Filled 2016-05-18: qty 10

## 2016-05-18 MED ORDER — EPINEPHRINE PF 1 MG/ML IJ SOLN
INTRAMUSCULAR | Status: AC
  Filled 2016-05-18: qty 1

## 2016-05-18 MED ORDER — LACTATED RINGERS IV SOLN
INTRAVENOUS | Status: DC | PRN
  Administered 2016-05-18: 10:00:00 via INTRAVENOUS

## 2016-05-18 MED ORDER — SENNOSIDES-DOCUSATE SODIUM 8.6-50 MG PO TABS: 1.0000 | ORAL_TABLET | Freq: Every evening | ORAL | Status: DC | PRN

## 2016-05-18 MED ORDER — SUGAMMADEX SODIUM 200 MG/2ML IV SOLN
INTRAVENOUS | Status: DC | PRN
  Administered 2016-05-18: 140 mg via INTRAVENOUS

## 2016-05-18 MED ORDER — ROCURONIUM BROMIDE 10 MG/ML (PF) SYRINGE
PREFILLED_SYRINGE | INTRAVENOUS | Status: AC
  Filled 2016-05-18: qty 10

## 2016-05-18 MED ORDER — PHENOL 1.4 % MT LIQD
1.0000 | OROMUCOSAL | Status: DC | PRN
  Filled 2016-05-18: qty 177

## 2016-05-18 MED ORDER — SODIUM CHLORIDE 0.9 % IV SOLN: 250.0000 mL | INTRAVENOUS | Status: DC

## 2016-05-18 MED ORDER — SODIUM CHLORIDE 0.9% FLUSH
3.0000 mL | Freq: Two times a day (BID) | INTRAVENOUS | Status: DC
  Administered 2016-05-19: 3 mL via INTRAVENOUS

## 2016-05-18 MED ORDER — PROPOFOL 10 MG/ML IV BOLUS
INTRAVENOUS | Status: DC | PRN
  Administered 2016-05-18: 170 mg via INTRAVENOUS
  Administered 2016-05-18: 30 mg via INTRAVENOUS

## 2016-05-18 MED ORDER — BACITRACIN 50000 UNITS IM SOLR
INTRAMUSCULAR | Status: DC | PRN
  Administered 2016-05-18: 1000 mL

## 2016-05-18 MED ORDER — ONDANSETRON HCL 4 MG/2ML IJ SOLN
INTRAMUSCULAR | Status: AC
  Filled 2016-05-18: qty 2

## 2016-05-18 MED ORDER — DEXAMETHASONE SODIUM PHOSPHATE 10 MG/ML IJ SOLN
INTRAMUSCULAR | Status: DC | PRN
  Administered 2016-05-18: 5 mg via INTRAVENOUS

## 2016-05-18 MED ORDER — DEXAMETHASONE SODIUM PHOSPHATE 10 MG/ML IJ SOLN
INTRAMUSCULAR | Status: AC
  Filled 2016-05-18: qty 1

## 2016-05-18 MED ORDER — ACETAMINOPHEN 325 MG PO TABS: 650.0000 mg | ORAL_TABLET | ORAL | Status: DC | PRN

## 2016-05-18 MED ORDER — BUPIVACAINE-EPINEPHRINE 0.5% -1:200000 IJ SOLN
INTRAMUSCULAR | Status: DC | PRN
  Administered 2016-05-18: 10 mL

## 2016-05-18 MED ORDER — SODIUM CHLORIDE 0.9 % IJ SOLN
INTRAMUSCULAR | Status: AC
  Filled 2016-05-18: qty 10

## 2016-05-18 MED ORDER — FENTANYL CITRATE (PF) 100 MCG/2ML IJ SOLN
INTRAMUSCULAR | Status: DC | PRN
  Administered 2016-05-18 (×3): 50 ug via INTRAVENOUS
  Administered 2016-05-18: 100 ug via INTRAVENOUS

## 2016-05-18 MED ORDER — SODIUM CHLORIDE 0.9% FLUSH: 3.0000 mL | INTRAVENOUS | Status: DC | PRN

## 2016-05-18 MED ORDER — ONDANSETRON HCL 4 MG/2ML IJ SOLN: 4.0000 mg | INTRAMUSCULAR | Status: DC | PRN

## 2016-05-18 MED ORDER — LACTATED RINGERS IV SOLN
INTRAVENOUS | Status: DC
  Administered 2016-05-18 (×2): 50 mL/h via INTRAVENOUS

## 2016-05-18 MED ORDER — PHENYLEPHRINE 40 MCG/ML (10ML) SYRINGE FOR IV PUSH (FOR BLOOD PRESSURE SUPPORT)
PREFILLED_SYRINGE | INTRAVENOUS | Status: AC
  Filled 2016-05-18: qty 10

## 2016-05-18 MED ORDER — ONDANSETRON HCL 4 MG/2ML IJ SOLN
INTRAMUSCULAR | Status: DC | PRN
  Administered 2016-05-18: 4 mg via INTRAVENOUS

## 2016-05-18 MED ORDER — ONDANSETRON HCL 4 MG/2ML IJ SOLN: 4.0000 mg | Freq: Once | INTRAMUSCULAR | Status: DC | PRN

## 2016-05-18 MED ORDER — FENTANYL CITRATE (PF) 100 MCG/2ML IJ SOLN
25.0000 ug | INTRAMUSCULAR | Status: DC | PRN
  Administered 2016-05-18 (×4): 25 ug via INTRAVENOUS

## 2016-05-18 MED ORDER — SODIUM CHLORIDE 0.9 % IV SOLN
INTRAVENOUS | Status: DC | PRN
  Administered 2016-05-18: 75 ug/min via INTRAVENOUS

## 2016-05-18 MED ORDER — SUCCINYLCHOLINE CHLORIDE 20 MG/ML IJ SOLN
INTRAMUSCULAR | Status: DC | PRN
  Administered 2016-05-18: 100 mg via INTRAVENOUS

## 2016-05-18 MED ORDER — CEFAZOLIN IN D5W 1 GM/50ML IV SOLN
INTRAVENOUS | Status: AC
  Filled 2016-05-18: qty 50

## 2016-05-18 MED ORDER — MIDAZOLAM HCL 5 MG/5ML IJ SOLN
INTRAMUSCULAR | Status: DC | PRN
  Administered 2016-05-18: 2 mg via INTRAVENOUS

## 2016-05-18 MED ORDER — HYDROMORPHONE HCL 1 MG/ML IJ SOLN
0.5000 mg | INTRAMUSCULAR | Status: DC | PRN
  Administered 2016-05-18: 0.5 mg via INTRAVENOUS
  Administered 2016-05-18: 1 mg via INTRAVENOUS
  Administered 2016-05-18: 0.5 mg via INTRAVENOUS
  Filled 2016-05-18 (×2): qty 1

## 2016-05-18 MED ORDER — BACITRACIN 50000 UNITS IM SOLR
INTRAMUSCULAR | Status: AC
  Filled 2016-05-18: qty 1

## 2016-05-18 MED ORDER — FENTANYL CITRATE (PF) 250 MCG/5ML IJ SOLN
INTRAMUSCULAR | Status: AC
  Filled 2016-05-18: qty 5

## 2016-05-18 MED ORDER — PHENYLEPHRINE HCL 10 MG/ML IJ SOLN
INTRAMUSCULAR | Status: DC | PRN
  Administered 2016-05-18: 80 ug via INTRAVENOUS
  Administered 2016-05-18 (×2): 120 ug via INTRAVENOUS
  Administered 2016-05-18: 40 ug via INTRAVENOUS
  Administered 2016-05-18: 100 ug via INTRAVENOUS

## 2016-05-18 MED ORDER — SUGAMMADEX SODIUM 200 MG/2ML IV SOLN
INTRAVENOUS | Status: AC
  Filled 2016-05-18: qty 2

## 2016-05-18 MED ORDER — CEFAZOLIN IN D5W 1 GM/50ML IV SOLN
1.0000 g | Freq: Three times a day (TID) | INTRAVENOUS | Status: AC
  Administered 2016-05-19: 1 g via INTRAVENOUS
  Filled 2016-05-18 (×2): qty 50

## 2016-05-18 MED ORDER — LIDOCAINE HCL (CARDIAC) 20 MG/ML IV SOLN
INTRAVENOUS | Status: DC | PRN
  Administered 2016-05-18: 60 mg via INTRAVENOUS

## 2016-05-18 MED ORDER — MIDAZOLAM HCL 2 MG/2ML IJ SOLN
INTRAMUSCULAR | Status: AC
  Filled 2016-05-18: qty 2

## 2016-05-18 MED ORDER — DOCUSATE SODIUM 100 MG PO CAPS
100.0000 mg | ORAL_CAPSULE | Freq: Two times a day (BID) | ORAL | Status: DC
  Administered 2016-05-18 – 2016-05-19 (×2): 100 mg via ORAL
  Filled 2016-05-18 (×2): qty 1

## 2016-05-18 MED ORDER — HYDROCODONE-ACETAMINOPHEN 5-325 MG PO TABS
1.0000 | ORAL_TABLET | ORAL | Status: DC | PRN
  Administered 2016-05-18 (×2): 1 via ORAL
  Administered 2016-05-18 – 2016-05-19 (×3): 2 via ORAL
  Filled 2016-05-18 (×2): qty 2
  Filled 2016-05-18 (×2): qty 1
  Filled 2016-05-18: qty 2

## 2016-05-18 MED ORDER — HEPARIN SODIUM (PORCINE) 5000 UNIT/ML IJ SOLN
5000.0000 [IU] | Freq: Three times a day (TID) | INTRAMUSCULAR | Status: DC
  Administered 2016-05-18 – 2016-05-19 (×2): 5000 [IU] via SUBCUTANEOUS
  Filled 2016-05-18 (×2): qty 1

## 2016-05-18 MED ORDER — DEXMEDETOMIDINE HCL 200 MCG/2ML IV SOLN
INTRAVENOUS | Status: DC | PRN
  Administered 2016-05-18: 16 ug via INTRAVENOUS

## 2016-05-18 MED ORDER — ACETAMINOPHEN 650 MG RE SUPP: 650.0000 mg | RECTAL | Status: DC | PRN

## 2016-05-18 SURGICAL SUPPLY — 62 items
BAND RUBBER 3X1/6 TAN STRL (MISCELLANEOUS) ×2 IMPLANT
BLADE BOVIE TIP EXT 4 (BLADE) ×2 IMPLANT
BUR NEURO DRILL SOFT 3.0X3.8M (BURR) ×2 IMPLANT
CANISTER SUCT 1200ML W/VALVE (MISCELLANEOUS) ×2 IMPLANT
CHLORAPREP W/TINT 26ML (MISCELLANEOUS) ×4 IMPLANT
CNTNR SPEC 2.5X3XGRAD LEK (MISCELLANEOUS) ×1
CONT SPEC 4OZ STER OR WHT (MISCELLANEOUS) ×1
CONTAINER SPEC 2.5X3XGRAD LEK (MISCELLANEOUS) ×1 IMPLANT
COUNTER NEEDLE 20/40 LG (NEEDLE) ×2 IMPLANT
COVER LIGHT HANDLE STERIS (MISCELLANEOUS) ×4 IMPLANT
CUP MEDICINE 2OZ PLAST GRAD ST (MISCELLANEOUS) ×2 IMPLANT
DRAPE C-ARM XRAY 36X54 (DRAPES) ×4 IMPLANT
DRAPE LAPAROTOMY 100X77 ABD (DRAPES) ×2 IMPLANT
DRAPE MICROSCOPE LEICA (MISCELLANEOUS) ×2 IMPLANT
DRAPE POUCH INSTRU U-SHP 10X18 (DRAPES) ×2 IMPLANT
DRAPE SURG 17X11 SM STRL (DRAPES) ×2 IMPLANT
DRAPE TABLE BACK 80X90 (DRAPES) ×2 IMPLANT
DRESSING TELFA 4X3 1S ST N-ADH (GAUZE/BANDAGES/DRESSINGS) IMPLANT
DRSG TEGADERM 4X4.75 (GAUZE/BANDAGES/DRESSINGS) IMPLANT
DURASEAL APPLICATOR TIP (TIP) ×2 IMPLANT
DURASEAL SPINE SEALANT 3ML (MISCELLANEOUS) ×2 IMPLANT
ELECT CAUTERY BLADE TIP 2.5 (TIP) ×2
ELECT EZSTD 165MM 6.5IN (MISCELLANEOUS) ×2
ELECT REM PT RETURN 9FT ADLT (ELECTROSURGICAL) ×2
ELECTRODE CAUTERY BLDE TIP 2.5 (TIP) ×1 IMPLANT
ELECTRODE EZSTD 165MM 6.5IN (MISCELLANEOUS) ×1 IMPLANT
ELECTRODE REM PT RTRN 9FT ADLT (ELECTROSURGICAL) ×1 IMPLANT
GLOVE BIO SURGEON STRL SZ8 (GLOVE) ×4 IMPLANT
GLOVE BIOGEL PI IND STRL 8 (GLOVE) ×1 IMPLANT
GLOVE BIOGEL PI INDICATOR 8 (GLOVE) ×1
GOWN STRL REUS W/ TWL LRG LVL3 (GOWN DISPOSABLE) ×1 IMPLANT
GOWN STRL REUS W/ TWL XL LVL3 (GOWN DISPOSABLE) ×1 IMPLANT
GOWN STRL REUS W/TWL LRG LVL3 (GOWN DISPOSABLE) ×1
GOWN STRL REUS W/TWL XL LVL3 (GOWN DISPOSABLE) ×1
GRADUATE 1200CC STRL 31836 (MISCELLANEOUS) ×2 IMPLANT
GRAFT DURAGEN MATRIX 1WX1L (Tissue) ×2 IMPLANT
IV CATH ANGIO 12GX3 LT BLUE (NEEDLE) ×2 IMPLANT
KIT RM TURNOVER STRD PROC AR (KITS) ×2 IMPLANT
KIT WILSON FRAME (KITS) ×2 IMPLANT
MARKER SKIN DUAL TIP RULER LAB (MISCELLANEOUS) ×6 IMPLANT
NDL SAFETY ECLIPSE 18X1.5 (NEEDLE) ×1 IMPLANT
NEEDLE HYPO 18GX1.5 SHARP (NEEDLE) ×1
NEEDLE HYPO 22GX1.5 SAFETY (NEEDLE) ×2 IMPLANT
NS IRRIG 1000ML POUR BTL (IV SOLUTION) ×2 IMPLANT
PACK LAMINECTOMY NEURO (CUSTOM PROCEDURE TRAY) ×2 IMPLANT
PAD ARMBOARD 7.5X6 YLW CONV (MISCELLANEOUS) ×2 IMPLANT
PATTIES SURGICAL .5 X.5 (GAUZE/BANDAGES/DRESSINGS) ×2 IMPLANT
SPOGE SURGIFLO 8M (HEMOSTASIS) ×1
SPONGE SURGIFLO 8M (HEMOSTASIS) ×1 IMPLANT
STAPLER SKIN PROX 35W (STAPLE) IMPLANT
STRIP CLOSURE SKIN 1/2X4 (GAUZE/BANDAGES/DRESSINGS) IMPLANT
SUT NURALON 4 0 TR CR/8 (SUTURE) IMPLANT
SUT VIC AB 0 CT1 18XCR BRD 8 (SUTURE) ×2 IMPLANT
SUT VIC AB 0 CT1 8-18 (SUTURE) ×2
SUT VIC AB 3-0 SH 8-18 (SUTURE) ×4 IMPLANT
SYR 20CC LL (SYRINGE) ×2 IMPLANT
SYR 30ML LL (SYRINGE) ×4 IMPLANT
SYRINGE 10CC LL (SYRINGE) ×4 IMPLANT
TOWEL OR 17X26 4PK STRL BLUE (TOWEL DISPOSABLE) ×4 IMPLANT
TRAY FOLEY CATH SILVER 16FR LF (SET/KITS/TRAYS/PACK) ×2 IMPLANT
TRAY FOLEY W/METER SILVER 16FR (SET/KITS/TRAYS/PACK) IMPLANT
TUBING CONNECTING 10 (TUBING) ×2 IMPLANT

## 2016-05-18 NOTE — NC FL2 (Signed)
[place] LEVEL OF CARE SCREENING TOOL     IDENTIFICATION  Patient Name: Pryce Gair Birthdate: 1961/02/16 Sex: male Admission Date (Current Location): 05/18/2016  [place] and [place] Number:  Engineering geologist and Address:  [facility], [address] [place], [place] [address]      Provider Number: [id number]  Attending Physician Name and Address:  Blanche East, MD  Relative Name and Phone Number:       Current Level of Care: Hospital Recommended Level of Care: [place] Prior Approval Number:    Date Approved/Denied:   PASRR Number:  ([id number])  Discharge Plan: SNF    Current Diagnoses: Patient Active Problem List   Diagnosis Date Noted  . Herniated lumbar intervertebral disc 05/18/2016    Orientation RESPIRATION BLADDER Height & Weight     Self, Time, Situation, Place  Normal Continent Weight: 154 lb (69.9 kg) Height:  5\' 6"  (167.6 cm)  BEHAVIORAL SYMPTOMS/MOOD NEUROLOGICAL BOWEL NUTRITION STATUS   (none)  (none) Continent Diet (Regular Diet )  AMBULATORY STATUS COMMUNICATION OF NEEDS Skin   Extensive Assist Verbally Surgical wounds (Incision: Back )                       Personal Care Assistance Level of Assistance  Bathing, Feeding, Dressing Bathing Assistance: Limited assistance Feeding assistance: Independent Dressing Assistance: Limited assistance     Functional Limitations Info  Sight, Hearing, Speech Sight Info: Adequate Hearing Info: Adequate Speech Info: Adequate    SPECIAL CARE FACTORS FREQUENCY  PT (By licensed PT), OT (By licensed OT)     PT Frequency:  (5) OT Frequency:  (5)            Contractures      Additional Factors Info  Code Status, Allergies Code Status Info:  (Full Code. ) Allergies Info:  (Nsaids)           Current Medications (05/18/2016):  This is the current hospital active medication list Current Facility-Administered  Medications  Medication Dose Route Frequency Provider Last Rate Last Dose  . 0.9 %  sodium chloride infusion  250 mL Intravenous Continuous Muhammad Abd-El-Barr, MD      . 0.9 % NaCl with KCl 20 mEq/ L  infusion   Intravenous Continuous Blanche East, MD 75 mL/hr at 05/18/16 1431    . acetaminophen (TYLENOL) tablet 650 mg  650 mg Oral Q4H PRN Blanche East, MD       Or  . acetaminophen (TYLENOL) suppository 650 mg  650 mg Rectal Q4H PRN Blanche East, MD      . ceFAZolin (ANCEF) IVPB 1 g/50 mL premix  1 g Intravenous Q8H Muhammad Abd-El-Barr, MD      . diazepam (VALIUM) tablet 5 mg  5 mg Oral Q6H PRN Blanche East, MD      . docusate sodium (COLACE) capsule 100 mg  100 mg Oral BID Blanche East, MD      . heparin injection 5,000 Units  5,000 Units Subcutaneous Q8H Muhammad Abd-El-Barr, MD      . HYDROcodone-acetaminophen (NORCO/VICODIN) 5-325 MG per tablet 1-2 tablet  1-2 tablet Oral Q4H PRN Blanche East, MD   1 tablet at 05/18/16 1515  . HYDROmorphone (DILAUDID) injection 0.5-1 mg  0.5-1 mg Intravenous Q2H PRN Blanche East, MD      . menthol-cetylpyridinium (CEPACOL) lozenge 3 mg  1 lozenge Oral PRN Blanche East, MD       Or  . phenol (CHLORASEPTIC)  mouth spray 1 spray  1 spray Mouth/Throat PRN Blanche East, MD      . ondansetron (ZOFRAN) injection 4 mg  4 mg Intravenous Q4H PRN Blanche East, MD      . oxyCODONE-acetaminophen (PERCOCET/ROXICET) 5-325 MG per tablet 1-2 tablet  1-2 tablet Oral Q4H PRN Blanche East, MD      . senna-docusate (Senokot-S) tablet 1 tablet  1 tablet Oral QHS PRN Blanche East, MD      . sodium chloride flush (NS) 0.9 % injection 3 mL  3 mL Intravenous Q12H Muhammad Abd-El-Barr, MD      . sodium chloride flush (NS) 0.9 % injection 3 mL  3 mL Intravenous PRN Blanche East, MD         Discharge Medications: Please see discharge summary for a list of discharge  medications.  Relevant Imaging Results:  Relevant Lab Results:   Additional Information  (SSN: [id number])  Shubham Thackston, Veronia Beets, LCSW

## 2016-05-18 NOTE — Brief Op Note (Signed)
05/18/2016  12:33 PM  PATIENT:  Luis Porter  55 y.o. male  PRE-OPERATIVE DIAGNOSIS:  LUMBAR STENOSIS  POST-OPERATIVE DIAGNOSIS:  LUMBAR STENOSIS  PROCEDURE:  Procedure(s): right L4-5 microdiscectomy (Right)  SURGEON:  Surgeon(s) and Role:    * Blanche East, MD - Primary  PHYSICIAN ASSISTANT:  Liliane Bade PA-C  ANESTHESIA:   general  EBL:  Total I/O In: 1400 [I.V.:1400] Out: 50 [Blood:50]  BLOOD ADMINISTERED:none  DRAINS: none   LOCAL MEDICATIONS USED:  BUPIVICAINE   SPECIMEN:  No Specimen  DISPOSITION OF SPECIMEN:  N/A  COUNTS:  YES  TOURNIQUET:  * No tourniquets in log *  DICTATION: .Dragon Dictation  PLAN OF CARE: Admit for overnight observation  PATIENT DISPOSITION:  PACU - hemodynamically stable.   Delay start of Pharmacological VTE agent (>24hrs) due to surgical blood loss or risk of bleeding: no

## 2016-05-18 NOTE — Op Note (Signed)
INDICATIONS: Patient is a 55 year old male with history of right lower extremity pain and numbness and subjective weakness.  He tried physical therapy with this did not help much. MRI showed a large herniated disc L4-L5 on the right.  We've discussed with him possible interventions including watchful waiting and surgery. I discussed with him the risks of surgery, which include, but are not limited to bleeding, infection, CSF leak, need for further surgery and neurological injury. He elected to pursue surgery. OPERATIVE CASE: I met the patient in the preoperative area and confirm his identity and the procedure. I marked the right side of his back.  Patient was rolled into the operative theater. General anesthesia was administered and the endotracheal tube was placed. Patient was flipped on Wilson frame. Pressure points were properly padded. C-arm was brought into place through one incision. Patient was prepped and draped in a sterile fashion. A timeout was done through the patient and the procedure. Local anesthesia was infiltrated for skin. A #10 blade was used to make an incision. Bovie cautery was used through this incision. An incision to the right of the spinous process was done with the Bovie cautery. The smallest Depuy Spotlight tube was placed.  C-arm was used to confirm the level. Subsequent dilators were used. A 21 mm tube was used and connected to the bed.  The microscope was brought into the field. The muscle over the spinous process and lamina was dissected. The power drill was used to make a small laminotomy on the right. The ligamentum flavum was encountered. An upgoing currette was used to get through the ligamentum.  Dura was seen.  Upgoing currettes and Kerrison was used to dissect lateral to the theca.  A large disc herniation was felt.  #15 blade was made to make an annulotomy.  A large disc fragment was removed.  Further work was done to take out more disc.  During the dissection, a small  amount of CSF was encountered but no obvious durotomy was found.  The theca was found to be unhindered by any disc.  Antibiotic irrigation was used.  Surgiseal and Duragen was used over the theca.  Attention was placed to closure.  0 Vicryls were used to close the fascia.  Another layer of 0 vicryls were used.  3-0 vicryls were used to close the subcuticular tissue in an inverted fashion.  A sterile dressing was placed.  The patient was flipped supine on a regular OR table.  A foley catheter was placed.  COMPLICATIONS:  None  EBL:  25 cc  I was present for the entire case.

## 2016-05-18 NOTE — Anesthesia Procedure Notes (Signed)
Procedure Name: Intubation Date/Time: 05/18/2016 10:15 AM Performed by: Dionne Bucy Pre-anesthesia Checklist: Patient identified, Patient being monitored, Timeout performed, Emergency Drugs available and Suction available Patient Re-evaluated:Patient Re-evaluated prior to inductionOxygen Delivery Method: Circle system utilized Preoxygenation: Pre-oxygenation with 100% oxygen Intubation Type: IV induction Ventilation: Mask ventilation without difficulty Laryngoscope Size: Mac and 4 Grade View: Grade II Tube type: Oral Tube size: 7.5 mm Number of attempts: 1 Airway Equipment and Method: Stylet Placement Confirmation: ETT inserted through vocal cords under direct vision,  positive ETCO2 and breath sounds checked- equal and bilateral Secured at: 21 cm Tube secured with: Tape Dental Injury: Teeth and Oropharynx as per pre-operative assessment

## 2016-05-18 NOTE — H&P (Signed)
Referring Physician:  Juliane Lack, PA [address] [place], [place] [address]  Primary Physician:  Ambrose Pancoast, MD  Chief Complaint: New onset right lower extremity weakness and back pain for 2 months.  History of Present Illness: Luis Porter is a 55 y.o. male who presents with the chief complaint of right lower extremity weakness and back pain for 2 months.  Patient has a history of right sided lower extremity radiculopathy which has been managed with physical ex exercise and therapy.  However 2 months ago he started to notice that he had new right-sided weakness and problems with walking.  He also had pain going down his posterior lateral thigh into the medial side of his foot.  He tried some physical therapy but this was not helpful.  No bowel or bladder problems.  This weakness in his foot has made him leave his job.   The symptoms are causing a significant impact on the patient's life.   Review of Systems:  A 10 point review of systems is negative, except for the pertinent positives and negatives detailed in the HPI.  Past Medical History:     Past Medical History:  Diagnosis Date  . Alcohol abuse, daily use 03/03/2014  . Benign non-nodular prostatic hyperplasia with lower urinary tract symptoms 03/31/2014  . Diverticulosis of large intestine without hemorrhage 03/03/2014  . Essential hypertension 07/08/2011  . Gastritis 08.05.2011  . GERD (gastroesophageal reflux disease)   . Hiatal hernia with gastroesophageal reflux 07/07/2011  . Hyperlipidemia, unspecified 05/06/2014   ASCVD 10 year risk: 10.2%: Consider Statin therapy.   . Insomnia 03/03/2014  . Lateral epicondylitis of right elbow 01/03/2013  . Lumbago with sciatica 07/07/2011  . Perforated diverticulitis 10/25/2013  . Tobacco use    Quit 1997  . Umbilical hernia [id number]  . Umbilical hernia [id number]    Past Surgical History:      Past Surgical History:  Procedure Laterality  Date  . HERNIA REPAIR     umbilical hernia x 2 with mesh  . SURVEILLANCE COLONOSCOPY N/A 04/08/2014   Procedure: SURVEILLANCE COLONOSCOPY;  Surgeon: Lula Olszewski, MD;  Location:  [facility];  Service: Gastroenterology;  Laterality: N/A;    Problem List:    Patient Active Problem List  Diagnosis  . Hiatal hernia with gastroesophageal reflux  . Lumbago with sciatica  . Essential hypertension  . Alcohol abuse, daily use  . Insomnia  . Diverticulosis of large intestine without hemorrhage  . Benign non-nodular prostatic hyperplasia with lower urinary tract symptoms  . Hyperlipidemia, unspecified    Allergies:    Allergies as of 04/28/2016  . (No Known Allergies)    Medications: Encounter Medications        Outpatient Encounter Prescriptions as of 04/28/2016  Medication Sig Dispense Refill  . amLODIPine-benazepril (LOTREL) 10-20 mg capsule Take 1 capsule by mouth once daily. 90 capsule 3  . gabapentin (NEURONTIN) 300 MG capsule Take 1 capsule (300 mg total) by mouth 2 (two) times daily. (Patient not taking: Reported on 04/07/2016 ) 60 capsule 1  . HYDROcodone-acetaminophen (NORCO) 5-325 mg tablet Take by mouth.    Marland Kitchen omeprazole (PRILOSEC) 40 MG DR capsule Take 1 capsule (40 mg total) by mouth 2 (two) times daily. 180 capsule 3  . oxyCODONE (ROXICODONE) 5 MG immediate release tablet Take 1 tablet (5 mg total) by mouth every 4 (four) hours as needed for Pain for up to 40 doses. 40 tablet 0  . sildenafil (REVATIO) 20  mg tablet May use 1-5 tabs daily 1/2 hr before intercourse 20 tablet 3  . traMADol (ULTRAM) 50 mg tablet Take 1 tablet (50 mg total) by mouth every 6 (six) hours as needed for Pain for up to 60 doses. 40 tablet 1  . traZODone (DESYREL) 50 MG tablet TAKE 1 TABLET (50 MG TOTAL) BY MOUTH NIGHTLY. 30 tablet 4  . zolpidem (AMBIEN) 5 MG tablet Take 1 tablet (5 mg total) by mouth nightly as needed for Sleep. 30 tablet 3   No  facility-administered encounter medications on file as of 04/28/2016.       Social History:        Social History  Substance Use Topics  . Smoking status: Former Smoker    Packs/day: 2.00    Years: 10.00    Types: Cigarettes    Quit date: 10/24/1988  . Smokeless tobacco: Never Used     Comment: patient quit about 20 years ago  . Alcohol use 6.0 oz/week     10 Cans of beer per week     Family Medical History:       Family History  Problem Relation Age of Onset  . Alcohol abuse Mother   . High blood pressre (Hypertension) Mother   . Cancer Mother 22    ovarian cancer  . Cancer Father     ESOPHAGUS  . High blood pressre (Hypertension) Father   . Gout Father   . High blood pressre (Hypertension) Brother   . Colon cancer Neg Hx     Physical Examination:    Vitals:   04/28/16 1431  BP: 132/89  Pulse: (!) 122  Resp: 18  Temp: 36.9 C (98.4 F)  TempSrc: Oral  SpO2: 98%  Weight: 69.4 kg (153 lb)  Height: 167.6 cm (5\' 6" )  PainSc:   5  PainLoc: Leg    General:             Patient is well developed, well nourished, calm, collected, and in no apparent distress.  Psychiatric:        Patient is non-anxious.  Head:                 Pupils equal, round, and reactive to light.  ENT:                  Oral mucosa appears well hydrated.  Neck:                 Supple.  Full range of motion.  Respiratory:       Patient is breathing without any difficulty.  Extremities:        No edema.  Vascular:           Palpable pulses.  Skin:                  On exposed skin, there are no abnormal skin lesions.  NEUROLOGICAL:  General: In no acute distress.   Awake, alert, oriented to person, place, and time.  Pupils equal round and reactive to light.  Facial tone is symmetric.  Tongue protrusion is midline.  There is no pronator drift.    Strength: Side Biceps Triceps Deltoid Interossei Grip Wrist Ext. Wrist Flex.  R 5 5 5 5 5 5 5    L 5 5 5 5 5 5 5    Side Iliopsoas Quads Hamstring PF DF EHL  R 5 5 5 5 5 3   L 5 5 5 5 5  5  Reflexes are 2+ and symmetric at the biceps, triceps, brachioradialis, patella and achilles.   Bilateral upper and lower extremity sensation is intact to light touch and pin prick.  Clonus is not present.  Toes are down-going.  Gait is normal.  No difficulty with tandem gait.  Hoffman's is absent.  Imaging: MRI from April 12, 2016 shows a large right sided herniated disc at L4-L5 causing compression of the traversing L5 roots.  There is also a moderate right sided L5-S1 disc herniation as well.  I have personally reviewed the images and agree with the above interpretation.  Assessment and Plan: Luis Porter is a pleasant 55 y.o. male with right-sided lower extremity pain and weakness.  I have discussed the condition with the patient, including showing the radiographs and discussing treatment options in layman's terms.  Thus, I have recommended the following:   1)  Given the patient's weakness and failure of conservative therapy, I think he would be a good candidate for a right L4-5 microdiscectomy.  I believe that his L5-S1 disc herniation is chronic and since he was able to manage with it, don't feel that it needs to be dealt with at this time.  I spoke at length with the patient about the risks of surgery, which include, but are not limited to infection, bleeding, need for further surgery, CSF leak, neurological injury.  He would like to proceed with surgery.  We have planned this to be on December 20 at [facility].  Thank you for involving me in the care of this patient. I will keep you apprised of the patient's progr Referring Physician:  Juliane Lack, [place] [address] [place], [place] [address]  Primary Physician:  Ambrose Pancoast, MD  Chief Complaint: New onset right lower extremity weakness and back pain for 2 months.  History of Present Illness: Luis  Porter is a 55 y.o. male who presents with the chief complaint of right lower extremity weakness and back pain for 2 months.  Patient has a history of right sided lower extremity radiculopathy which has been managed with physical ex exercise and therapy.  However 2 months ago he started to notice that he had new right-sided weakness and problems with walking.  He also had pain going down his posterior lateral thigh into the medial side of his foot.  He tried some physical therapy but this was not helpful.  No bowel or bladder problems.  This weakness in his foot has made him leave his job.   The symptoms are causing a significant impact on the patient's life.   Review of Systems:  A 10 point review of systems is negative, except for the pertinent positives and negatives detailed in the HPI.  Past Medical History:     Past Medical History:  Diagnosis Date  . Alcohol abuse, daily use 03/03/2014  . Benign non-nodular prostatic hyperplasia with lower urinary tract symptoms 03/31/2014  . Diverticulosis of large intestine without hemorrhage 03/03/2014  . Essential hypertension 07/08/2011  . Gastritis 08.05.2011  . GERD (gastroesophageal reflux disease)   . Hiatal hernia with gastroesophageal reflux 07/07/2011  . Hyperlipidemia, unspecified 05/06/2014   ASCVD 10 year risk: 10.2%: Consider Statin therapy.   . Insomnia 03/03/2014  . Lateral epicondylitis of right elbow 01/03/2013  . Lumbago with sciatica 07/07/2011  . Perforated diverticulitis 10/25/2013  . Tobacco use    Quit 1997  . Umbilical hernia [id number]  . Umbilical hernia [id number]    Past Surgical History:  Past Surgical History:  Procedure Laterality Date  . HERNIA REPAIR     umbilical hernia x 2 with mesh  . SURVEILLANCE COLONOSCOPY N/A 04/08/2014   Procedure: SURVEILLANCE COLONOSCOPY;  Surgeon: Lula Olszewski, MD;  Location: [facility];  Service: Gastroenterology;  Laterality: N/A;    Problem  List:    Patient Active Problem List  Diagnosis  . Hiatal hernia with gastroesophageal reflux  . Lumbago with sciatica  . Essential hypertension  . Alcohol abuse, daily use  . Insomnia  . Diverticulosis of large intestine without hemorrhage  . Benign non-nodular prostatic hyperplasia with lower urinary tract symptoms  . Hyperlipidemia, unspecified    Allergies:    Allergies as of 04/28/2016  . (No Known Allergies)    Medications: Encounter Medications        Outpatient Encounter Prescriptions as of 04/28/2016  Medication Sig Dispense Refill  . amLODIPine-benazepril (LOTREL) 10-20 mg capsule Take 1 capsule by mouth once daily. 90 capsule 3  . gabapentin (NEURONTIN) 300 MG capsule Take 1 capsule (300 mg total) by mouth 2 (two) times daily. (Patient not taking: Reported on 04/07/2016 ) 60 capsule 1  . HYDROcodone-acetaminophen (NORCO) 5-325 mg tablet Take by mouth.    Marland Kitchen omeprazole (PRILOSEC) 40 MG DR capsule Take 1 capsule (40 mg total) by mouth 2 (two) times daily. 180 capsule 3  . oxyCODONE (ROXICODONE) 5 MG immediate release tablet Take 1 tablet (5 mg total) by mouth every 4 (four) hours as needed for Pain for up to 40 doses. 40 tablet 0  . sildenafil (REVATIO) 20 mg tablet May use 1-5 tabs daily 1/2 hr before intercourse 20 tablet 3  . traMADol (ULTRAM) 50 mg tablet Take 1 tablet (50 mg total) by mouth every 6 (six) hours as needed for Pain for up to 60 doses. 40 tablet 1  . traZODone (DESYREL) 50 MG tablet TAKE 1 TABLET (50 MG TOTAL) BY MOUTH NIGHTLY. 30 tablet 4  . zolpidem (AMBIEN) 5 MG tablet Take 1 tablet (5 mg total) by mouth nightly as needed for Sleep. 30 tablet 3   No facility-administered encounter medications on file as of 04/28/2016.       Social History:        Social History  Substance Use Topics  . Smoking status: Former Smoker    Packs/day: 2.00    Years: 10.00    Types: Cigarettes    Quit date: 10/24/1988  . Smokeless tobacco: Never  Used     Comment: patient quit about 20 years ago  . Alcohol use 6.0 oz/week     10 Cans of beer per week     Family Medical History:       Family History  Problem Relation Age of Onset  . Alcohol abuse Mother   . High blood pressre (Hypertension) Mother   . Cancer Mother 75    ovarian cancer  . Cancer Father     ESOPHAGUS  . High blood pressre (Hypertension) Father   . Gout Father   . High blood pressre (Hypertension) Brother   . Colon cancer Neg Hx     Physical Examination:    Vitals:   04/28/16 1431  BP: 132/89  Pulse: (!) 122  Resp: 18  Temp: 36.9 C (98.4 F)  TempSrc: Oral  SpO2: 98%  Weight: 69.4 kg (153 lb)  Height: 167.6 cm (5\' 6" )  PainSc:   5  PainLoc: Leg    General:  Patient is well developed, well nourished, calm, collected, and in no apparent distress.  Psychiatric:        Patient is non-anxious.  Head:                 Pupils equal, round, and reactive to light.  ENT:                  Oral mucosa appears well hydrated.  Neck:                 Supple.  Full range of motion.  Respiratory:       Patient is breathing without any difficulty.  Extremities:        No edema.  Vascular:           Palpable pulses.  Skin:                  On exposed skin, there are no abnormal skin lesions.  NEUROLOGICAL:  General: In no acute distress.   Awake, alert, oriented to person, place, and time.  Pupils equal round and reactive to light.  Facial tone is symmetric.  Tongue protrusion is midline.  There is no pronator drift.    Strength: Side Biceps Triceps Deltoid Interossei Grip Wrist Ext. Wrist Flex.  R 5 5 5 5 5 5 5   L 5 5 5 5 5 5 5    Side Iliopsoas Quads Hamstring PF DF EHL  R 5 5 5 5 5 3   L 5 5 5 5 5 5    Reflexes are 2+ and symmetric at the biceps, triceps, brachioradialis, patella and achilles.   Bilateral upper and lower extremity sensation is intact to light touch and pin prick.  Clonus is not  present.  Toes are down-going.  Gait is normal.  No difficulty with tandem gait.  Hoffman's is absent.  Imaging: MRI from April 12, 2016 shows a large right sided herniated disc at L4-L5 causing compression of the traversing L5 roots.  There is also a moderate right sided L5-S1 disc herniation as well.  I have personally reviewed the images and agree with the above interpretation.  Assessment and Plan: Luis Porter is a pleasant 55 y.o. male with right-sided lower extremity pain and weakness.  I have discussed the condition with the patient, including showing the radiographs and discussing treatment options in layman's terms.  Thus, I have recommended the following:   1)  Given the patient's weakness and failure of conservative therapy, I think he would be a good candidate for a right L4-5 microdiscectomy.  I believe that his L5-S1 disc herniation is chronic and since he was able to manage with it, don't feel that it needs to be dealt with at this time.  I spoke at length with the patient about the risks of surgery, which include, but are not limited to infection, bleeding, need for further surgery, CSF leak, neurological injury.  He would like to proceed with surgery.  We have planned this to be on December 20 at [facility].  Thank you for involving me in the care of this patient. I will keep you apprised of the patient's progress.   This note was partially dictated using voice recognition software, so please excuse any errors that were not corrected.   Felipa Furnace, MD      Electronically signed by Nolon Nations Abd-El-Barr, MD at 04/28/2016 4:26 PM    ess.   This note was partially dictated using voice recognition software, so  please excuse any errors that were not corrected.   Felipa Furnace, MD      Electronically signed by Nolon Nations Abd-El-Barr, MD at 04/28/2016 4:26 PM

## 2016-05-18 NOTE — Transfer of Care (Signed)
Immediate Anesthesia Transfer of Care Note  Patient: Luis Porter  Procedure(s) Performed: Procedure(s): right L4-5 microdiscectomy (Right)  Patient Location: PACU  Anesthesia Type:General  Level of Consciousness: awake, alert  and patient cooperative  Airway & Oxygen Therapy: Patient Spontanous Breathing and Patient connected to face mask oxygen  Post-op Assessment: Report given to RN and Post -op Vital signs reviewed and stable  Post vital signs: Reviewed and stable  Last Vitals:  Vitals:   05/18/16 0827 05/18/16 1257  BP: 124/76 (!) 136/111  Pulse: 94 (!) 102  Resp: 15 14  Temp: (!) 35.9 C 36.5 C    Last Pain:  Vitals:   05/18/16 0834  TempSrc:   PainSc: 5          Complications: No apparent anesthesia complications

## 2016-05-18 NOTE — Anesthesia Preprocedure Evaluation (Addendum)
Anesthesia Evaluation  Patient identified by MRN, date of birth, ID band Patient awake    Reviewed: Allergy & Precautions, NPO status , Patient's Chart, lab work & pertinent test results  Airway Mallampati: II       Dental  (+) Teeth Intact   Pulmonary neg pulmonary ROS, former smoker,    breath sounds clear to auscultation       Cardiovascular Exercise Tolerance: Good hypertension, Pt. on medications  Rhythm:Regular     Neuro/Psych negative neurological ROS  negative psych ROS   GI/Hepatic Neg liver ROS, hiatal hernia, GERD  Medicated,  Endo/Other  negative endocrine ROS  Renal/GU negative Renal ROS     Musculoskeletal negative musculoskeletal ROS (+)   Abdominal Normal abdominal exam  (+)   Peds negative pediatric ROS (+)  Hematology negative hematology ROS (+)   Anesthesia Other Findings   Reproductive/Obstetrics negative OB ROS                            Anesthesia Physical Anesthesia Plan  ASA: II  Anesthesia Plan: General   Post-op Pain Management:    Induction: Intravenous  Airway Management Planned: Oral ETT  Additional Equipment:   Intra-op Plan:   Post-operative Plan:   Informed Consent: I have reviewed the patients History and Physical, chart, labs and discussed the procedure including the risks, benefits and alternatives for the proposed anesthesia with the patient or authorized representative who has indicated his/her understanding and acceptance.     Plan Discussed with: CRNA  Anesthesia Plan Comments:         Anesthesia Quick Evaluation

## 2016-05-18 NOTE — Interval H&P Note (Signed)
History and Physical Interval Note:  Patient with continued right lower extremity radiculopathy.  Plan for right L4-5 microdiscectomy   05/18/2016 9:28 AM  Luis Porter  has presented today for surgery, with the diagnosis of LUMBAR STENOSIS  The various methods of treatment have been discussed with the patient and family. After consideration of risks, benefits and other options for treatment, the patient has consented to  Procedure(s): LUMBAR LAMINECTOMY/DECOMPRESSION MICRODISCECTOMY 1 LEVEL (Right) as a surgical intervention .  The patient's history has been reviewed, patient examined, no change in status, stable for surgery.  I have reviewed the patient's chart and labs.  Questions were answered to the patient's satisfaction.     Luis Porter

## 2016-05-18 NOTE — Anesthesia Postprocedure Evaluation (Signed)
Anesthesia Post Note  Patient: Luis Porter  Procedure(s) Performed: Procedure(s) (LRB): right L4-5 microdiscectomy (Right)  Patient location during evaluation: PACU Anesthesia Type: General Level of consciousness: awake Pain management: pain level controlled Vital Signs Assessment: post-procedure vital signs reviewed and stable Respiratory status: spontaneous breathing Cardiovascular status: stable Anesthetic complications: no     Last Vitals:  Vitals:   05/18/16 1341 05/18/16 1343  BP: 105/66 105/66  Pulse: 84 82  Resp: 16 (!) 22  Temp: 37.4 C     Last Pain:  Vitals:   05/18/16 1341  TempSrc:   PainSc: 2                  VAN STAVEREN,Auna Mikkelsen

## 2016-05-19 ENCOUNTER — Encounter: Payer: Self-pay | Admitting: Neurological Surgery

## 2016-05-19 DIAGNOSIS — M48061 Spinal stenosis, lumbar region without neurogenic claudication: Secondary | ICD-10-CM | POA: Diagnosis not present

## 2016-05-19 MED ORDER — HYDROCODONE-ACETAMINOPHEN 5-325 MG PO TABS: 1.0000 | ORAL_TABLET | ORAL | 0 refills | Status: DC | PRN

## 2016-05-19 MED ORDER — PANTOPRAZOLE SODIUM 40 MG PO TBEC
40.0000 mg | DELAYED_RELEASE_TABLET | Freq: Two times a day (BID) | ORAL | Status: DC
  Administered 2016-05-19: 40 mg via ORAL
  Filled 2016-05-19: qty 1

## 2016-05-19 MED ORDER — DIAZEPAM 5 MG PO TABS: 5.0000 mg | ORAL_TABLET | Freq: Four times a day (QID) | ORAL | 0 refills | Status: DC | PRN

## 2016-05-19 MED ORDER — AMLODIPINE BESYLATE 10 MG PO TABS
10.0000 mg | ORAL_TABLET | Freq: Every day | ORAL | Status: DC
  Administered 2016-05-19: 10 mg via ORAL
  Filled 2016-05-19: qty 1

## 2016-05-19 NOTE — Discharge Summary (Signed)
Patient underwent a right L4-5 microdiscectomy on 05/18/2016 for right lower extremity weakness. There was an incidental durotomy at the time of surgery, and as such, patient was kept flat overnight.  Postoperatively, patient no longer complained of right lower extremity radiculopathy.  Examination: 5/5 in bilateral lower extremities, except slight 4+ weakness in right EHL. Incision:  C/d/i  Disposition:  Home with home health.  Admit Date: 05/18/2016 Discharge Date: 05/19/2016 Admitting Physician: Provider Not In System  Discharge Physician: No att. providers found  Primary Care Provider: [place]  Admission Diagnoses:  LUMBAR STENOSIS  Discharge Diagnoses:  @HPROBLRESOLVED @  Consult Orders: CONSULT TO CARE MANAGEMENT CONSULT TO SOCIAL WORK    Surgeries Performed: Procedure(s): right L4-5 microdiscectomy  Brief History of Present Illness: Patient underwent a right L4-5 microdiscectomy on 05/18/2016 for right lower extremity weakness. There was an incidental durotomy at the time of surgery, and as such, patient was kept flat overnight.  Postoperatively, patient no longer complained of right lower extremity radiculopathy.   Hospital Course: Uncomplicated  Discharge Exam:  5/5 5/5 in bilateral lower extremities, except slight 4+ weakness in right EHL. Incision:  C/d/i Discharge Disposition: Home with homehealth Allergies: Allergies  Allergen Reactions  . Nsaids Other (See Comments)    Due to history of ulcers    Patient Instructions:  Discharge Medication List as of 05/19/2016  1:43 PM    START taking these medications   Details  diazepam (VALIUM) 5 MG tablet Take 1 tablet (5 mg total) by mouth every 6 (six) hours as needed for muscle spasms., Starting Thu 05/19/2016, Print      CONTINUE these medications which have CHANGED   Details  HYDROcodone-acetaminophen (NORCO/VICODIN) 5-325 MG tablet Take 1-2 tablets by mouth every 4 (four) hours as  needed (mild pain)., Starting Thu 05/19/2016, Print      CONTINUE these medications which have NOT CHANGED   Details  amLODipine-benazepril (LOTREL) 10-20 MG capsule Take 1 capsule by mouth daily., Starting Tue 03/08/2016, Historical Med    Multiple Vitamin (MULTIVITAMIN WITH MINERALS) TABS tablet Take 1 tablet by mouth daily., Historical Med    omeprazole (PRILOSEC) 40 MG capsule Take 40 mg by mouth 2 (two) times daily., Starting Fri 03/18/2016, Historical Med    sildenafil (REVATIO) 20 MG tablet Take 20-100 mg by mouth as directed. Take 1-5 tablets (20mg -100mg ) daily as needed 30 minutes prior to sexual intercourse., Starting Thu 10/15/2015, Historical Med    traZODone (DESYREL) 50 MG tablet Take 50 mg by mouth at bedtime as needed for sleep., Starting Thu 04/07/2016, Historical Med      STOP taking these medications     oxyCODONE (OXY IR/ROXICODONE) 5 MG immediate release tablet      predniSONE (DELTASONE) 10 MG tablet         Code Status:  Full Code  Activity:  activity as tolerated  Diet: @DIETSUPPORDS @  d  Results Pending at Discharge:  none  Follow-up Tests Recommended:  none  No future appointments.   Time Spent:  15 minutes  Blanche East, MD 05/19/2016

## 2016-05-19 NOTE — Progress Notes (Signed)
Clinical Social Worker (CSW) received SNF consult. PT is recommending home health. RN case manager aware of above. Please reconsult if future social work needs arise. CSW signing off.   Cathey Fredenburg, LCSW [phone] 

## 2016-05-19 NOTE — Care Management Note (Signed)
Case Management Note  Patient Details  Name: Luis Porter MRN: [id number] Date of Birth: 1960/10/26  Subjective/Objective:  Met with patient at bedside. He is ready for discharge. He refuses home health or a walker. Independent with adls. Lives alone.                  Action/Plan: No needs identified.   Expected Discharge Date:  05/19/16               Expected Discharge Plan:  Home/Self Care  In-House Referral:     Discharge planning Services     Post Acute Care Choice:  Home Health Choice offered to:  Patient  DME Arranged:    DME Agency:     HH Arranged:  Patient Refused [place] Agency:     Status of Service:  Completed, signed off  If discussed at [employer], dates discussed:    Additional Comments:  Jolly Mango, RN 05/19/2016, 1:05 PM

## 2016-05-19 NOTE — Evaluation (Addendum)
Occupational Therapy Evaluation Patient Details Name: Luis Porter MRN: [id number] DOB: [birth year] Today's Date: 05/19/2016    History of Present Illness Pt. is a 55 y.o. male who was admitted to [facility] for an L4-5 Microdiscectomy.   Clinical Impression   Pt. Is a 55 y.o. Male who was admitted to [facility] for an L4-5 Microdiscectomy. Pt. Was provided with a visual handout, and education about body mechanics with ADLs, and IADLs following back surgery. Pt. was provided with education about A/E use for LE ADLs. Pt. Reports having a LH sponge. Pt. Plans to return home alone upon discharge. Pt. Reports having things in place for meals. No further OT services are warranted at this time. Pt. Is in agreement.    Follow Up Recommendations  No OT follow up    Equipment Recommendations       Recommendations for Other Services       Precautions / Restrictions Restrictions Weight Bearing Restrictions: Yes                                                     ADL                                  Functional Mobility for ADLs: Indpendent       General ADL Comments: Pt. is aware of ADL functioning using proper body mechanics following back surgery. Pt. education was provided about A/E use for LE ADLs. A visual handout was provided.     Vision     Perception     Praxis      Pertinent Vitals/Pain Pain Assessment: 0-10 Pain Score: 4  Pain Intervention(s): Monitored during session     Hand Dominance Right   Extremity/Trunk Assessment Upper Extremity Assessment Upper Extremity Assessment: Overall WFL for tasks assessed           Communication     Cognition Arousal/Alertness: Awake/alert Behavior During Therapy: WFL for tasks assessed/performed Overall Cognitive Status: Within Functional Limits for tasks assessed                     General Comments       Exercises       Shoulder Instructions      Home Living  Family/patient expects to be discharged to:: Private residence Living Arrangements: Alone Available Help at Discharge: Family Type of Home: House Home Access: Stairs to enter Technical brewer of Steps: 5 Entrance Stairs-Rails: Right;Left Home Layout: One level     Bathroom Shower/Tub: Tub/shower unit;Curtain Shower/tub characteristics: Sales executive: Yes   Home Equipment: None          Prior Functioning/Environment Level of Independence: Independent        Comments: Pt. was independent with ADLs/ IADLs, and driving. Pt. 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        OT Problem List:     OT Treatment/Interventions:      OT Goals(Current goals can be found in the care plan section) Acute Rehab OT Goals Patient Stated Goal: To return home OT Goal Formulation: With patient Potential to Achieve Goals: Good  OT Frequency:     Barriers to D/C:            Co-evaluation  End of Session    Activity Tolerance: Patient tolerated treatment well Patient left: in chair;with call bell/phone within reach;with chair alarm set   Time: [phone] OT Time Calculation (min): 18 min Charges:  OT General Charges $OT Visit: 1 Procedure OT Evaluation $OT Eval Low Complexity: 1 Procedure G-Codes: OT G-codes **NOT FOR INPATIENT CLASS** Functional Assessment Tool Used: clinical judgement based on pt. current functional status. Functional Limitation: Self care Self Care Current Status [phone]): At least 1 percent but less than 20 percent impaired, limited or restricted Self Care Goal Status [id number]): At least 1 percent but less than 20 percent impaired, limited or restricted  Harrel Carina, MS,OTR/L 05/19/2016, 10:28 AM

## 2016-05-19 NOTE — Evaluation (Signed)
Physical Therapy Evaluation Patient Details Name: Luis Porter MRN: [id number] DOB: [birth year] Today's Date: 05/19/2016   History of Present Illness  Pt. is a 55 y.o. male who was admitted to [facility] for an L4-5 Microdiscectomy.  Clinical Impression  Pt presents with deficits in strength, gait, balance, and activity tolerance.  Pt able to amb 200' without AD with SBA with some mild instability including occasional drifting L/R with scissoring 1-2x to correct.  Pt refused use of RW at this time and instability is [place] enough that overall fall risk appears low.  Pt educated on back precautions and proper log roll technique with pt able to demonstrate proper sequencing after education without assistance.  Pt will benefit from PT services to address above deficits for decreased caregiver assistance upon discharge.      Follow Up Recommendations Home health PT    Equipment Recommendations  None recommended by PT (Pt refuses use of AD at this time)    Recommendations for Other Services       Precautions / Restrictions Precautions Precautions: Back Precaution Comments: Standard back precautions Restrictions Weight Bearing Restrictions: No      Mobility  Bed Mobility Overal bed mobility: Independent             General bed mobility comments: Min verbal cues for proper log roll technique with good carry over and pt able to demonstrate proper technique after education provided.  Transfers Overall transfer level: Independent                  Ambulation/Gait Ambulation/Gait assistance: Supervision Ambulation Distance (Feet): 200 Feet Assistive device: None Gait Pattern/deviations: Step-through pattern   Gait velocity interpretation: Below normal speed for age/gender General Gait Details: Min instablity during amb secondary to RLE weakness with some mild drifting L/R but pt able to self-correct without LOB.   Pt refused offer for RW trial.   Stairs Stairs: Yes Stairs  assistance: Supervision Stair Management: Step to pattern;One rail Left;One rail Right Number of Stairs: 3 General stair comments: Min verbal cues for proper sequencing secondary to RLE weakness  Wheelchair Mobility    Modified Rankin (Stroke Patients Only)       Balance Overall balance assessment: Needs assistance Sitting-balance support: No upper extremity supported Sitting balance-Leahy Scale: Normal     Standing balance support: No upper extremity supported Standing balance-Leahy Scale: Good   Single Leg Stance - Right Leg: 1 Single Leg Stance - Left Leg: 10                         Pertinent Vitals/Pain Pain Assessment: 0-10 Pain Score: 2  Pain Descriptors / Indicators: Constant Pain Intervention(s): Monitored during session    Home Living Family/patient expects to be discharged to:: Private residence Living Arrangements: Alone Available Help at Discharge:  (Pt reports no family in the area but could get intermittent help if needed) Type of Home: House Home Access: Stairs to enter Entrance Stairs-Rails: Psychiatric nurse of Steps: 5 Home Layout: One level Home Equipment: None      Prior Function Level of Independence: Independent         Comments: Pt was independent with Amb limited community distances without AD, Ind with ADLs/ IADLs, and driving. 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     Hand Dominance   Dominant Hand: Right    Extremity/Trunk Assessment   Upper Extremity Assessment Upper Extremity Assessment: Overall WFL for tasks assessed  Lower Extremity Assessment Lower Extremity Assessment: RLE deficits/detail RLE Deficits / Details: General weakness to RLE vs LLE but unable to fully test secondary to MMT eliciting back pain. RLE: Unable to fully assess due to pain RLE Sensation:  (Light touch and proprioception grossly intact to BLEs)       Communication   Communication: No  difficulties  Cognition Arousal/Alertness: Awake/alert Behavior During Therapy: WFL for tasks assessed/performed Overall Cognitive Status: Within Functional Limits for tasks assessed                      General Comments      Exercises Total Joint Exercises Ankle Circles/Pumps: Strengthening;Both;10 reps Long Arc Quad: Strengthening;Both;Other reps (comment) (2x10) Knee Flexion: Strengthening;Both;Other reps (comment) (2x10) Other Exercises Other Exercises: Standing heel raises 2 x 10 Other Exercises: Standing mini squats x 10   Assessment/Plan    PT Assessment Patient needs continued PT services  PT Problem List Decreased strength;Decreased balance          PT Treatment Interventions Gait training;Stair training;Functional mobility training;Therapeutic activities;Therapeutic exercise;Balance training;Neuromuscular re-education;Patient/family education    PT Goals (Current goals can be found in the Care Plan section)  Acute Rehab PT Goals Patient Stated Goal: To get back to work PT Goal Formulation: With patient Time For Goal Achievement: 06/01/16 Potential to Achieve Goals: Good    Frequency Min 2X/week   Barriers to discharge        Co-evaluation               End of Session Equipment Utilized During Treatment: Gait belt Activity Tolerance: Patient tolerated treatment well Patient left: in chair;with call bell/phone within reach      Functional Assessment Tool Used: Clinical reasoning Functional Limitation: Mobility: Walking and moving around Mobility: Walking and Moving Around Current Status [phone]): At least 1 percent but less than 20 percent impaired, limited or restricted Mobility: Walking and Moving Around Goal Status [phone]): 0 percent impaired, limited or restricted    Time: [id number] PT Time Calculation (min) (ACUTE ONLY): 32 min   Charges:   PT Evaluation $PT Eval Low Complexity: 1 Procedure PT Treatments $Therapeutic Exercise:  8-22 mins   PT G Codes:   PT G-Codes **NOT FOR INPATIENT CLASS** Functional Assessment Tool Used: Clinical reasoning Functional Limitation: Mobility: Walking and moving around Mobility: Walking and Moving Around Current Status [id number]): At least 1 percent but less than 20 percent impaired, limited or restricted Mobility: Walking and Moving Around Goal Status [phone]): 0 percent impaired, limited or restricted    D. Scott Travis Mastel PT, DPT 05/19/16, 12:30 PM

## 2016-05-19 NOTE — Progress Notes (Signed)
Pt being discharged home today. PIV removed. Discharge instructions reviewed with pt, all questions answered. He was given a handout on home care following his procedure. He was given his prescriptions to have filled at pharmacy. His follow up has been scheduled by office. He is leaving with all his belongings, he will transport himself home.

## 2016-10-07 ENCOUNTER — Ambulatory Visit: Payer: Self-pay | Admitting: Nurse Practitioner

## 2016-10-07 ENCOUNTER — Encounter: Payer: Self-pay | Admitting: Nurse Practitioner

## 2016-10-07 ENCOUNTER — Ambulatory Visit (INDEPENDENT_AMBULATORY_CARE_PROVIDER_SITE_OTHER): Payer: Commercial Managed Care - PPO | Admitting: Nurse Practitioner

## 2016-10-07 VITALS — BP 119/76 | HR 87 | Temp 98.4°F | Resp 16 | Ht 65.0 in | Wt 161.0 lb

## 2016-10-07 DIAGNOSIS — I10 Essential (primary) hypertension: Secondary | ICD-10-CM

## 2016-10-07 DIAGNOSIS — Z7689 Persons encountering health services in other specified circumstances: Secondary | ICD-10-CM | POA: Diagnosis not present

## 2016-10-07 DIAGNOSIS — L237 Allergic contact dermatitis due to plants, except food: Secondary | ICD-10-CM

## 2016-10-07 MED ORDER — METHYLPREDNISOLONE ACETATE 40 MG/ML IJ SUSP
40.0000 mg | Freq: Once | INTRAMUSCULAR | Status: AC
  Administered 2016-10-07: 40 mg via INTRAMUSCULAR

## 2016-10-07 MED ORDER — HYDRALAZINE HCL 10 MG PO TABS: ORAL_TABLET | ORAL | 0 refills | Status: DC

## 2016-10-07 MED ORDER — PREDNISONE 10 MG PO TABS
ORAL_TABLET | ORAL | 0 refills | Status: DC
Start: 2016-10-07 — End: 2016-11-08

## 2016-10-07 NOTE — Progress Notes (Signed)
Subjective:    Patient ID: Luis Porter, male    DOB: 1960-12-26, 56 y.o.   MRN: [id number]  Luis Porter is a 56 y.o. male presenting on 10/07/2016 for New Patient (Initial Visit) (Patient here today to establish care. Patient transferring care from [facility].) and Rash (Patient C/O of rash all over his body, pt concerned it may be from poison oak. )   HPI  Rash: Contact Dermatitis r/t Poison Oak ID'd poison oak leaf Thursday.  Contact and exposure occurred Wednesday and rash started yesterday on his left arm/elbow.  There are new rashes appearing around eye, penis, legs, ankles, arms.  The lesionsare in various stages of healing and are itching.  Out exploring new property and was [place] and had poison oak exposure (Didn't see it).  He has had poison ivy exposures before and used prednisone.    He has used calamine lotion and cortisone ointment with moderate relief of itching.  Hypertension Usually 120s/70-80 He has taken amlodipine-benazepril 10-20 mg once daily and tolerates it well without side effects.  Over the last couple of years he has noticed an increase at night to 130s - 140/80-90.  Pt denies headache, lightheadedness, dizziness, changes in vision, chest tightness/pressure, palpitations, sudden loss of speech or loss of consiousness.   Past Medical History:  Diagnosis Date  . Diverticulosis   . Gastric ulcer   . GERD (gastroesophageal reflux disease)   . History of hiatal hernia   . Hypertension    Past Surgical History:  Procedure Laterality Date  . HERNIA REPAIR  9326   Umbilical Hernia Repair  . LUMBAR LAMINECTOMY/DECOMPRESSION MICRODISCECTOMY Right 05/18/2016   Procedure: right L4-5 microdiscectomy;  Surgeon: Blanche East, MD;  Location: [facility] ORS;  Service: Neurosurgery;  Laterality: Right;  . TONSILLECTOMY     Social History   Social History  . Marital status: Divorced    Spouse name: N/A  . Number of children: N/A    . Years of education: N/A   Occupational History  . Not on file.   Social History Main Topics  . Smoking status: Former Smoker    Packs/day: 2.00    Types: Cigarettes    Quit date: 05/31/1995  . Smokeless tobacco: Never Used  . Alcohol use Yes     Comment: 2 cans of beer daily  . Drug use: No  . Sexual activity: Not on file   Other Topics Concern  . Not on file   Social History Narrative  . No narrative on file   Family History  Problem Relation Age of Onset  . Hypertension Mother   . Hypertension Father    Current Outpatient Prescriptions on File Prior to Visit  Medication Sig  . amLODipine-benazepril (LOTREL) 10-20 MG capsule Take 1 capsule by mouth daily.   No current facility-administered medications on file prior to visit.     Review of Systems  Constitutional: Negative.   HENT: Negative.   Eyes: Negative.   Respiratory: Negative.   Cardiovascular: Negative.   Gastrointestinal: Negative.   Endocrine: Negative.   Genitourinary: Negative.   Musculoskeletal: Negative.   Neurological: Negative.   Hematological: Negative.   Psychiatric/Behavioral: Negative.    Per HPI unless specifically indicated above      Objective:    BP 119/76 (BP Location: Right Arm, Patient Position: Sitting, Cuff Size: Large)   Pulse 87   Temp 98.4 F (36.9 C) (Oral)   Resp 16   Ht 5\' 5"  (1.651 m)  Wt 161 lb (73 kg)   SpO2 96%   BMI 26.79 kg/m   Wt Readings from Last 3 Encounters:  10/07/16 161 lb (73 kg)  05/18/16 154 lb (69.9 kg)  05/11/16 155 lb (70.3 kg)    Physical Exam  Constitutional: He is oriented to person, place, and time. He appears well-developed and well-nourished. No distress.  HENT:  Head: Normocephalic and atraumatic.  Eyes: Pupils are equal, round, and reactive to light.  Neck: Normal range of motion. Neck supple. No JVD present. No tracheal deviation present.  Cardiovascular: Normal rate, regular rhythm, normal heart sounds and intact distal  pulses.   Pulmonary/Chest: Effort normal and breath sounds normal.  Abdominal: Soft. Bowel sounds are normal. He exhibits no distension and no mass. There is no tenderness.  Musculoskeletal: Normal range of motion.  Lymphadenopathy:    He has no cervical adenopathy.  Neurological: He is alert and oriented to person, place, and time.  Skin: Skin is warm and dry. Rash noted.  Pustular rash at various stages with some lesions open and scabbing (rash over arms, ankle, eye, penis)  Psychiatric: He has a normal mood and affect. His behavior is normal. Judgment and thought content normal.   Results for orders placed or performed during the hospital encounter of 05/18/16  CBC  Result Value Ref Range   WBC 13.8 (H) 3.8 - 10.6 K/uL   RBC 4.28 (L) 4.40 - 5.90 MIL/uL   Hemoglobin 14.5 13.0 - 18.0 g/dL   HCT 41.1 40.0 - 52.0 %   MCV 96.1 80.0 - 100.0 fL   MCH 33.8 26.0 - 34.0 pg   MCHC 35.2 32.0 - 36.0 g/dL   RDW 14.1 11.5 - 14.5 %   Platelets 286 150 - 440 K/uL  Creatinine, serum  Result Value Ref Range   Creatinine, Ser 0.70 0.61 - 1.24 mg/dL   GFR calc non Af Amer >60 >60 mL/min   GFR calc Af Amer >60 >60 mL/min  I-STAT 4, (NA,K, GLUC, HGB,HCT)  Result Value Ref Range   Sodium 139 135 - 145 mmol/L   Potassium 3.8 3.5 - 5.1 mmol/L   Glucose, Bld 86 65 - 99 mg/dL   HCT 43.0 39.0 - 52.0 %   Hemoglobin 14.6 13.0 - 17.0 g/dL      Assessment & Plan:   Problem List Items Addressed This Visit      Cardiovascular and Mediastinum   Essential hypertension - Primary Stable on amlodipine-benazepril 10-20 mg once daily.  He notes increasing BP and poor control in evenings.  Plan: 1. START hydralazine 10 mg once daily in evenings as needed for BP >140/90   2. Continue amlodipine-benazepril 10-20 mg one daily.    Relevant Medications   hydrALAZINE (APRESOLINE) 10 MG tablet    Other Visit Diagnoses    Poison oak dermatitis     Pt with known poison oak exposure.  Active rash with new  lesions appearing each day since exposure.  Now involving eye and genitals  Plan: 1. Administer methylprednisolone acetate 40 mg IM in clinic today. 2. Start 7 day prednisone taper. 3. Continue calamine and hydrocortisone cream. 4. Call clinic if not resolving or returns in 1-2 weeks.   Relevant Medications   predniSONE (DELTASONE) 10 MG tablet   methylPREDNISolone acetate (DEPO-MEDROL) injection 40 mg (Completed)      Meds ordered this encounter  Medications  . pantoprazole (PROTONIX) 40 MG tablet    Sig: Take 40 mg by mouth 2 (two) times  daily.  . traMADol (ULTRAM) 50 MG tablet    Sig: Take 50 mg by mouth every 6 (six) hours as needed.  . zolpidem (AMBIEN) 5 MG tablet    Sig: Take 5 mg by mouth at bedtime as needed for sleep.  . traZODone (DESYREL) 50 MG tablet    Sig: Take 50 mg by mouth at bedtime.      Follow up plan: Return in about 1 month (around 11/07/2016) for (2-4 weeks) annual physical.  Cassell Smiles, DNP, AGPCNP-BC Adult Gerontology Primary Care Nurse Practitioner [facility] 10/07/2016, 8:23 AM

## 2016-10-07 NOTE — Patient Instructions (Addendum)
Luis Porter, Thank you for coming in to clinic today.  1. You do have poision oak dermatitis (skin rash). - Take prednisone taper 10 mg tablets Day 1 (Today): Take 6 pills at one time Day 2: Take 5 pills Day 3: Take 4 pills Day 5: Take 3 pills Day 6: Take 2 pills Day 7: Take 1 pill, then stop. - You received an injection of solumedrol today in clinic.  This is also a steroid. - continue your calamine and cortisone cream as needed.  2. For your BP, Check your bp in evenings.  Take one tablet hydralazine as needed for BP >140 / 90 for either value.   Please schedule a follow-up appointment with Cassell Smiles, AGNP to Return in about 1 month (around 11/07/2016) for (2-4 weeks) annual physical.  If you have any other questions or concerns, please feel free to call the clinic or send a message through [facility]. You may also schedule an earlier appointment if necessary.  Cassell Smiles, DNP, AGNP-BC Adult Gerontology Nurse Practitioner [facility] [place], [place] Dermatitis Poison oak dermatitis is inflammation of the skin that is caused by contact with the allergens on the leaves of the poison oak (toxicodendron) plant. The skin reaction often includes redness, swelling, blisters, and extreme itching. What are the causes? This condition is caused by a specific chemical (urushiol) that is found in the sap of the poison oak plant. This chemical is sticky and it can be easily spread to people, animals, and objects. You can get poison oak dermatitis by:  Having direct contact with a poison oak plant.  Touching animals, other people, or objects that have come in contact with poison oak and have the chemical on them. What increases the risk? This condition is more likely to develop in people who:  Are outdoors often.  Go outdoors without wearing protective clothing, such as closed shoes, long pants, and a long-sleeved shirt. What are the signs or symptoms? Symptoms of  this condition include:  Redness of the skin.  A rash that may develop blisters.  Extreme itching.  Swelling. This may occur if the reaction is more severe. Symptoms usually last for 1-2 weeks. However, the first time you develop this condition, symptoms may last 3-4 weeks. How is this diagnosed? This condition may be diagnosed based on your symptoms and a physical exam. Your health care provider may also ask you about any recent outdoor activity. How is this treated? Treatment for this condition will vary depending on how severe it is. Treatment may include:  Hydrocortisone creams or calamine lotions to relieve itching.  Oatmeal baths to soothe the skin.  Over-the-counter antihistamine tablets.  Oral steroid medicine for more severe outbreaks. Follow these instructions at home:  Take or apply over-the-counter and prescription medicines only as told by your health care provider.  Wash exposed skin as soon as possible with soap and cold water.  Use hydrocortisone creams or calamine lotion as needed to soothe the skin and relieve itching.  Take oatmeal baths as needed. Use colloidal oatmeal. You can get this at your local pharmacy or grocery store. Follow the instructions on the packaging.  Do not scratch or rub your skin.  While you have the rash, wash clothes right after you wear them. How is this prevented?  Learn to identify the poison oak plant and avoid contact with the plant. This plant can be recognized by the number of leaves. Generally, poison oak has three leaves with  flowering branches on a single stem. The leaves are often a bit fuzzy and have a toothlike edge.  If you have been exposed to poison oak, thoroughly wash with soap and water right away. You have about 30 minutes to remove the plant resin before it will cause the rash. Be sure to wash under your fingernails because any plant resin there will continue to spread the rash.  When hiking or camping, wear  clothes that will help you avoid exposure on the skin. This includes long pants, a long-sleeved shirt, tall socks, and hiking boots. You can also apply preventive lotion to your skin to help limit exposure.  If you suspect that your clothes or outdoor gear came in contact with poison oak, rinse them off outside with a garden hose before bringing them inside your house. Contact a health care provider if:  You have open sores in the rash area.  You have more redness, swelling, or pain in the affected area.  You have redness that spreads beyond the rash area.  You have fluid, blood, or pus coming from the affected area.  You have a fever.  You have a rash over a large area of your body.  You have a rash on your eyes, mouth, or genitals.  Your rash does not improve after a few days. Get help right away if:  Your face swells or your eyes swell shut.  You have trouble breathing.  You have trouble swallowing. This information is not intended to replace advice given to you by your health care provider. Make sure you discuss any questions you have with your health care provider. Document Released: 11/20/2002 Document Revised: 10/22/2015 Document Reviewed: 10/22/2014 Elsevier Interactive Patient Education  2017 [employer].

## 2016-10-10 NOTE — Progress Notes (Signed)
I have reviewed this encounter including the documentation in this note and/or discussed this patient with the provider, Cassell Smiles, AGPCNP-BC. I am certifying that I agree with the content of this note as supervising physician.  Nobie Putnam, [facility] 10/10/2016, 5:43 PM

## 2016-11-08 ENCOUNTER — Encounter: Payer: Self-pay | Admitting: Nurse Practitioner

## 2016-11-08 ENCOUNTER — Ambulatory Visit (INDEPENDENT_AMBULATORY_CARE_PROVIDER_SITE_OTHER): Payer: Commercial Managed Care - PPO | Admitting: Nurse Practitioner

## 2016-11-08 VITALS — BP 137/80 | HR 81 | Temp 98.5°F | Ht 66.0 in | Wt 161.6 lb

## 2016-11-08 DIAGNOSIS — Z0000 Encounter for general adult medical examination without abnormal findings: Secondary | ICD-10-CM | POA: Diagnosis not present

## 2016-11-08 DIAGNOSIS — I10 Essential (primary) hypertension: Secondary | ICD-10-CM

## 2016-11-08 DIAGNOSIS — K420 Umbilical hernia with obstruction, without gangrene: Secondary | ICD-10-CM | POA: Insufficient documentation

## 2016-11-08 DIAGNOSIS — Z1211 Encounter for screening for malignant neoplasm of colon: Secondary | ICD-10-CM

## 2016-11-08 DIAGNOSIS — K429 Umbilical hernia without obstruction or gangrene: Secondary | ICD-10-CM

## 2016-11-08 HISTORY — DX: Umbilical hernia with obstruction, without gangrene: K42.0

## 2016-11-08 MED ORDER — AMLODIPINE BESY-BENAZEPRIL HCL 10-20 MG PO CAPS: 1.0000 | ORAL_CAPSULE | Freq: Every day | ORAL | 1 refills | Status: DC
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
## 2016-11-08 NOTE — Patient Instructions (Addendum)
Luis Porter, Thank you for coming in to clinic today.  1. For your colonoscopy: - They will call you with an appointment.  2. Annual exam is normal without new findings. - You will be due for FASTING BLOOD WORK (no food or drink after midnight before, only water or coffee without cream/sugar on the morning of)  For Lab Results, once available within 2-3 days of blood draw, you can can log in to MyChart online to view your results and a brief explanation. Also, we can discuss results at next follow-up visit.   Please schedule a follow-up appointment with Cassell Smiles, AGNP to Return in about 2 months (around 01/08/2017) for BP.  If you have any other questions or concerns, please feel free to call the clinic or send a message through [facility]. You may also schedule an earlier appointment if necessary.  Cassell Smiles, DNP, AGNP-BC Adult Gerontology Nurse Practitioner [place]

## 2016-11-08 NOTE — Progress Notes (Signed)
I have reviewed this encounter including the documentation in this note and/or discussed this patient with the provider, Cassell Smiles, AGPCNP-BC. I am certifying that I agree with the content of this note as supervising physician.  Nobie Putnam, [facility] 11/08/2016, 5:18 PM

## 2016-11-14 ENCOUNTER — Other Ambulatory Visit: Payer: Commercial Managed Care - PPO

## 2016-11-14 LAB — CBC WITH DIFFERENTIAL/PLATELET
Basophils Absolute: 64 cells/uL (ref 0–200)
Basophils Relative: 1 %
Eosinophils Absolute: 128 cells/uL (ref 15–500)
Eosinophils Relative: 2 %
HCT: 47.3 % (ref 38.5–50.0)
Hemoglobin: 16.1 g/dL (ref 13.2–17.1)
Lymphocytes Relative: 34 %
Lymphs Abs: 2176 cells/uL (ref 850–3900)
MCH: 32.5 pg (ref 27.0–33.0)
MCHC: 34 g/dL (ref 32.0–36.0)
MCV: 95.6 fL (ref 80.0–100.0)
MPV: 9.1 fL (ref 7.5–12.5)
Monocytes Absolute: 576 cells/uL (ref 200–950)
Monocytes Relative: 9 %
Neutro Abs: 3456 cells/uL (ref 1500–7800)
Neutrophils Relative %: 54 %
Platelets: 288 10*3/uL (ref 140–400)
RBC: 4.95 MIL/uL (ref 4.20–5.80)
RDW: 14.4 % (ref 11.0–15.0)
WBC: 6.4 10*3/uL (ref 3.8–10.8)

## 2016-11-15 LAB — LIPID PANEL
Cholesterol: 201 mg/dL — ABNORMAL HIGH (ref <200)
HDL: 60 mg/dL (ref >40)
LDL Cholesterol: 101 mg/dL — ABNORMAL HIGH (ref <100)
Total CHOL/HDL Ratio: 3.4 Ratio (ref <5.0)
Triglycerides: 201 mg/dL — ABNORMAL HIGH (ref <150)
VLDL: 40 mg/dL — ABNORMAL HIGH (ref <30)

## 2016-11-15 LAB — COMPREHENSIVE METABOLIC PANEL
ALT: 22 U/L (ref 9–46)
AST: 23 U/L (ref 10–35)
Albumin: 4.2 g/dL (ref 3.6–5.1)
Alkaline Phosphatase: 63 U/L (ref 40–115)
BUN: 7 mg/dL (ref 7–25)
CO2: 23 mmol/L (ref 20–31)
Calcium: 8.8 mg/dL (ref 8.6–10.3)
Chloride: 102 mmol/L (ref 98–110)
Creat: 0.61 mg/dL — ABNORMAL LOW (ref 0.70–1.33)
Glucose, Bld: 89 mg/dL (ref 65–99)
Potassium: 3.7 mmol/L (ref 3.5–5.3)
Sodium: 139 mmol/L (ref 135–146)
Total Bilirubin: 0.5 mg/dL (ref 0.2–1.2)
Total Protein: 7 g/dL (ref 6.1–8.1)

## 2016-11-15 LAB — HIV ANTIBODY (ROUTINE TESTING W REFLEX): HIV 1&2 Ab, 4th Generation: NONREACTIVE

## 2016-11-15 LAB — HEMOGLOBIN A1C
Hgb A1c MFr Bld: 5.2 % (ref <5.7)
Mean Plasma Glucose: 103 mg/dL

## 2016-11-15 LAB — HEPATITIS C ANTIBODY: HCV Ab: NEGATIVE

## 2016-11-15 LAB — TSH: TSH: 1.56 mIU/L (ref 0.40–4.50)

## 2016-11-21 ENCOUNTER — Other Ambulatory Visit: Payer: Self-pay

## 2016-11-21 ENCOUNTER — Telehealth: Payer: Self-pay

## 2016-11-21 DIAGNOSIS — K449 Diaphragmatic hernia without obstruction or gangrene: Secondary | ICD-10-CM

## 2016-11-21 DIAGNOSIS — Z1211 Encounter for screening for malignant neoplasm of colon: Secondary | ICD-10-CM

## 2016-11-21 DIAGNOSIS — Z86010 Personal history of colonic polyps: Secondary | ICD-10-CM

## 2016-11-21 DIAGNOSIS — K219 Gastro-esophageal reflux disease without esophagitis: Secondary | ICD-10-CM

## 2016-11-21 NOTE — Telephone Encounter (Signed)
Gastroenterology Pre-Procedure Review  Request Date: 12/30/16 Requesting Physician: Dr. Vicente Males  PATIENT REVIEW QUESTIONS: The patient responded to the following health history questions as indicated:    1. Are you having any GI issues? no 2. Do you have a personal history of Polyps? yes (5 yrs ago) 3. Do you have a family history of Colon Cancer or Polyps? no 4. Diabetes Mellitus? no 5. Joint replacements in the past 12 months?no 6. Major health problems in the past 3 months?yes (Pt had back surgery 05/21/16 Dr. Ledon Snare) 7. Any artificial heart valves, MVP, or defibrillator?no    MEDICATIONS & ALLERGIES:    Patient reports the following regarding taking any anticoagulation/antiplatelet therapy:   Plavix, Coumadin, Eliquis, Xarelto, Lovenox, Pradaxa, Brilinta, or Effient? no Aspirin? no  Patient confirms/reports the following medications:  Current Outpatient Prescriptions  Medication Sig Dispense Refill  . amLODipine-benazepril (LOTREL) 10-20 MG capsule Take 1 capsule by mouth daily. 90 capsule 1  . hydrALAZINE (APRESOLINE) 10 MG tablet Take one tablet in the evening as needed BP >140/90 30 tablet 0  . pantoprazole (PROTONIX) 40 MG tablet Take 40 mg by mouth 2 (two) times daily.    . traZODone (DESYREL) 50 MG tablet Take 50 mg by mouth at bedtime as needed for sleep.      No current facility-administered medications for this visit.     Patient confirms/reports the following allergies:  Allergies  Allergen Reactions  . Nsaids Other (See Comments)    Due to history of ulcers Due to history of ulcers    No orders of the defined types were placed in this encounter.   AUTHORIZATION INFORMATION Primary Insurance: 1D#: Group #:  Secondary Insurance: 1D#: Group #:  SCHEDULE INFORMATION: Date: 12/30/16 Time: Location:[facility]

## 2016-12-30 ENCOUNTER — Encounter: Payer: Self-pay | Admitting: *Deleted

## 2016-12-30 ENCOUNTER — Encounter
Admission: RE | Disposition: A | Discharge status: Home / Self Care | Payer: Self-pay | Source: Ambulatory Visit | Attending: Gastroenterology

## 2016-12-30 ENCOUNTER — Ambulatory Visit
Admission: RE | Admit: 2016-12-30 | Discharge: 2016-12-30 | Disposition: A | Discharge status: Home / Self Care | Payer: Commercial Managed Care - PPO | Source: Ambulatory Visit | Attending: Gastroenterology | Admitting: Gastroenterology

## 2016-12-30 ENCOUNTER — Ambulatory Visit: Payer: Commercial Managed Care - PPO | Admitting: Anesthesiology

## 2016-12-30 DIAGNOSIS — K210 Gastro-esophageal reflux disease with esophagitis: Secondary | ICD-10-CM | POA: Diagnosis not present

## 2016-12-30 DIAGNOSIS — Z86010 Personal history of colonic polyps: Secondary | ICD-10-CM | POA: Insufficient documentation

## 2016-12-30 DIAGNOSIS — K317 Polyp of stomach and duodenum: Secondary | ICD-10-CM | POA: Insufficient documentation

## 2016-12-30 DIAGNOSIS — D124 Benign neoplasm of descending colon: Secondary | ICD-10-CM

## 2016-12-30 DIAGNOSIS — D120 Benign neoplasm of cecum: Secondary | ICD-10-CM | POA: Diagnosis not present

## 2016-12-30 DIAGNOSIS — K219 Gastro-esophageal reflux disease without esophagitis: Secondary | ICD-10-CM | POA: Diagnosis not present

## 2016-12-30 DIAGNOSIS — K5730 Diverticulosis of large intestine without perforation or abscess without bleeding: Secondary | ICD-10-CM | POA: Diagnosis not present

## 2016-12-30 DIAGNOSIS — K449 Diaphragmatic hernia without obstruction or gangrene: Secondary | ICD-10-CM | POA: Diagnosis not present

## 2016-12-30 DIAGNOSIS — D125 Benign neoplasm of sigmoid colon: Secondary | ICD-10-CM | POA: Diagnosis not present

## 2016-12-30 DIAGNOSIS — D123 Benign neoplasm of transverse colon: Secondary | ICD-10-CM

## 2016-12-30 DIAGNOSIS — I10 Essential (primary) hypertension: Secondary | ICD-10-CM | POA: Diagnosis not present

## 2016-12-30 DIAGNOSIS — Z1211 Encounter for screening for malignant neoplasm of colon: Secondary | ICD-10-CM | POA: Diagnosis present

## 2016-12-30 DIAGNOSIS — Z87891 Personal history of nicotine dependence: Secondary | ICD-10-CM | POA: Insufficient documentation

## 2016-12-30 DIAGNOSIS — Z8719 Personal history of other diseases of the digestive system: Secondary | ICD-10-CM | POA: Diagnosis not present

## 2016-12-30 DIAGNOSIS — Z79899 Other long term (current) drug therapy: Secondary | ICD-10-CM | POA: Insufficient documentation

## 2016-12-30 HISTORY — PX: COLONOSCOPY WITH PROPOFOL: SHX5780

## 2016-12-30 HISTORY — PX: ESOPHAGOGASTRODUODENOSCOPY (EGD) WITH PROPOFOL: SHX5813

## 2016-12-30 SURGERY — COLONOSCOPY WITH PROPOFOL
Anesthesia: General

## 2016-12-30 MED ORDER — MIDAZOLAM HCL 2 MG/2ML IJ SOLN
INTRAMUSCULAR | Status: DC | PRN
  Administered 2016-12-30: 2 mg via INTRAVENOUS

## 2016-12-30 MED ORDER — GLYCOPYRROLATE 0.2 MG/ML IJ SOLN
INTRAMUSCULAR | Status: AC
  Filled 2016-12-30: qty 1

## 2016-12-30 MED ORDER — PROPOFOL 500 MG/50ML IV EMUL
INTRAVENOUS | Status: AC
  Filled 2016-12-30: qty 50

## 2016-12-30 MED ORDER — GLYCOPYRROLATE 0.2 MG/ML IJ SOLN
INTRAMUSCULAR | Status: DC | PRN
  Administered 2016-12-30: 0.2 mg via INTRAVENOUS

## 2016-12-30 MED ORDER — LIDOCAINE HCL (CARDIAC) 20 MG/ML IV SOLN
INTRAVENOUS | Status: DC | PRN
  Administered 2016-12-30: 40 mg via INTRAVENOUS
  Administered 2016-12-30: 60 mg via INTRAVENOUS

## 2016-12-30 MED ORDER — MIDAZOLAM HCL 2 MG/2ML IJ SOLN
INTRAMUSCULAR | Status: AC
  Filled 2016-12-30: qty 2

## 2016-12-30 MED ORDER — PROPOFOL 10 MG/ML IV BOLUS
INTRAVENOUS | Status: DC | PRN
  Administered 2016-12-30: 100 mg via INTRAVENOUS

## 2016-12-30 MED ORDER — LIDOCAINE HCL (PF) 2 % IJ SOLN
INTRAMUSCULAR | Status: AC
  Filled 2016-12-30: qty 2

## 2016-12-30 MED ORDER — SODIUM CHLORIDE 0.9 % IV SOLN
INTRAVENOUS | Status: DC
  Administered 2016-12-30: 08:00:00 via INTRAVENOUS

## 2016-12-30 MED ORDER — PROPOFOL 500 MG/50ML IV EMUL
INTRAVENOUS | Status: DC | PRN
  Administered 2016-12-30: 160 ug/kg/min via INTRAVENOUS

## 2016-12-30 NOTE — H&P (Signed)
[name] MD [address], [place], [place] [address] Phone: [phone] Fax : [phone]  Primary Care Physician:  Mikey College, NP Primary Gastroenterologist:  Dr. [name]   Pre-Procedure History & Physical: HPI:  Luis Porter is a 56 y.o. male is here for an endoscopy and colonoscopy.   Past Medical History:  Diagnosis Date  . Diverticulosis   . Gastric ulcer   . GERD (gastroesophageal reflux disease)   . History of hiatal hernia   . Hypertension     Past Surgical History:  Procedure Laterality Date  . HERNIA REPAIR  5035   Umbilical Hernia Repair  . LUMBAR LAMINECTOMY/DECOMPRESSION MICRODISCECTOMY Right 05/18/2016   Procedure: right L4-5 microdiscectomy;  Surgeon: Blanche East, MD;  Location: [facility] ORS;  Service: Neurosurgery;  Laterality: Right;  . TONSILLECTOMY      Prior to Admission medications   Medication Sig Start Date End Date Taking? Authorizing Provider  amLODipine-benazepril (LOTREL) 10-20 MG capsule Take 1 capsule by mouth daily. 11/08/16  Yes Mikey College, NP  pantoprazole (PROTONIX) 40 MG tablet Take 40 mg by mouth 2 (two) times daily.   Yes [provider]  traZODone (DESYREL) 50 MG tablet Take 50 mg by mouth at bedtime as needed for sleep.    Yes [provider]  hydrALAZINE (APRESOLINE) 10 MG tablet Take one tablet in the evening as needed BP >140/90 Patient not taking: Reported on 12/30/2016 10/07/16   Mikey College, NP    Allergies as of 11/21/2016 - Review Complete 11/08/2016  Allergen Reaction Noted  . Nsaids Other (See Comments) 05/09/2016    Family History  Problem Relation Age of Onset  . Hypertension Mother   . Cancer Mother        Ovarian  . Hypertension Father   . Cancer Father        tongue  . Hypertension Brother   . Prostate cancer Neg Hx   . Breast cancer Neg Hx   . Colon cancer Neg Hx   . Heart attack Neg Hx   . Stroke Neg Hx     Social History   Social  History  . Marital status: Divorced    Spouse name: N/A  . Number of children: N/A  . Years of education: N/A   Occupational History  . Not on file.   Social History Main Topics  . Smoking status: Former Smoker    Packs/day: 2.00    Years: 25.00    Types: Cigarettes    Quit date: 05/31/1987  . Smokeless tobacco: Never Used  . Alcohol use Yes     Comment: 2 cans of beer daily  . Drug use: No  . Sexual activity: Yes     Comment: parners birth control   Other Topics Concern  . Not on file   Social History Narrative  . No narrative on file    Review of Systems: See HPI, otherwise negative ROS  Physical Exam: BP (!) 155/95   Pulse 97   Temp 98.5 F (36.9 C) (Tympanic)   Resp 18   Ht 5\' 6"  (1.676 m)   Wt 160 lb (72.6 kg)   SpO2 98%   BMI 25.82 kg/m  General:   Alert,  pleasant and cooperative in NAD Head:  Normocephalic and atraumatic. Neck:  Supple; no masses or thyromegaly. Lungs:  Clear throughout to auscultation.    Heart:  Regular rate and rhythm. Abdomen:  Soft, nontender and nondistended. Normal bowel sounds, without guarding, and  without rebound.   Neurologic:  Alert and  oriented x4;  grossly normal neurologically.  Impression/Plan: Luis Porter is here for an endoscopy and colonoscopy to be performed for surveillance due to prior history of colon polyps, GERD  Risks, benefits, limitations, and alternatives regarding  endoscopy and colonoscopy have been reviewed with the patient.  Questions have been answered.  All parties agreeable.   [name], MD  12/30/2016, 8:01 AM

## 2016-12-30 NOTE — Op Note (Signed)
[facility] Gastroenterology Patient Name: Luis Porter Procedure Date: 12/30/2016 8:09 AM MRN: [id number] Account #: [id number] Date of Birth: 11/01/1960 Admit Type: Outpatient Age: 56 Room: [facility] ENDO ROOM 4 Gender: Male Note Status: Finalized Procedure:            Upper GI endoscopy Indications:          Follow-up of gastro-esophageal reflux disease Providers:            [name] MD, MD Referring MD:         No Local Md, MD (Referring MD) Medicines:            Monitored Anesthesia Care Complications:        No immediate complications. Procedure:            Pre-Anesthesia Assessment:                       - Prior to the procedure, a History and Physical was                        performed, and patient medications, allergies and                        sensitivities were reviewed. The patient's tolerance of                        previous anesthesia was reviewed.                       - The risks and benefits of the procedure and the                        sedation options and risks were discussed with the                        patient. All questions were answered and informed                        consent was obtained.                       - ASA Grade Assessment: III - A patient with severe                        systemic disease.                       After obtaining informed consent, the endoscope was                        passed under direct vision. Throughout the procedure,                        the patient's blood pressure, pulse, and oxygen                        saturations were monitored continuously. The Endoscope                        was introduced through the mouth, and advanced to the  third part of duodenum. The upper GI endoscopy was                        accomplished with ease. The patient tolerated the                        procedure well. Findings:      The examined duodenum was normal.      A 5 cm hiatal  hernia was present.      A single 6 mm sessile polyp with no bleeding and no stigmata of recent       bleeding was found on the greater curvature of the stomach. The polyp       was removed with a cold biopsy forceps. Resection and retrieval were       complete.      The Z-line was irregular and was found 36 cm from the incisors. Biopsies       were taken with a cold forceps for histology.      The exam was otherwise without abnormality. Impression:           - Normal examined duodenum.                       - 5 cm hiatal hernia.                       - A single gastric polyp. Resected and retrieved.                       - Z-line irregular, 36 cm from the incisors. Biopsied.                       - The examination was otherwise normal. Recommendation:       - Await pathology results. Procedure Code(s):    --- Professional ---                       [phone], Esophagogastroduodenoscopy, flexible, transoral;                        with biopsy, single or multiple Diagnosis Code(s):    --- Professional ---                       K44.9, Diaphragmatic hernia without obstruction or                        gangrene                       K31.7, Polyp of stomach and duodenum                       K22.8, Other specified diseases of esophagus                       K21.9, Gastro-esophageal reflux disease without                        esophagitis CPT copyright 2016 American Medical Association. All rights reserved. The codes documented in this report are preliminary and upon coder review may  be revised to meet current compliance requirements. [name], MD [name] MD, MD  12/30/2016 8:22:30 AM This report has been signed electronically. Number of Addenda: 0 Note Initiated On: 12/30/2016 8:09 AM      [facility]

## 2016-12-30 NOTE — Anesthesia Postprocedure Evaluation (Signed)
Anesthesia Post Note  Patient: Luis Porter  Procedure(s) Performed: Procedure(s) (LRB): COLONOSCOPY WITH PROPOFOL (N/A) ESOPHAGOGASTRODUODENOSCOPY (EGD) WITH PROPOFOL (N/A)  Patient location during evaluation: Endoscopy Anesthesia Type: General Level of consciousness: awake and alert Pain management: pain level controlled Vital Signs Assessment: post-procedure vital signs reviewed and stable Respiratory status: spontaneous breathing, nonlabored ventilation, respiratory function stable and patient connected to nasal cannula oxygen Cardiovascular status: blood pressure returned to baseline and stable Postop Assessment: no signs of nausea or vomiting Anesthetic complications: no     Last Vitals:  Vitals:   12/30/16 0910 12/30/16 0920  BP: 107/83 118/65  Pulse: 76 71  Resp: 14 (!) 23  Temp:      Last Pain:  Vitals:   12/30/16 0851  TempSrc: Tympanic                 Gicela Schwarting S

## 2016-12-30 NOTE — Anesthesia Procedure Notes (Signed)
Date/Time: 12/30/2016 8:10 AM Performed by: Doreen Salvage Pre-anesthesia Checklist: Patient identified, Emergency Drugs available, Suction available and Patient being monitored Patient Re-evaluated:Patient Re-evaluated prior to induction Oxygen Delivery Method: Nasal cannula Induction Type: IV induction Dental Injury: Teeth and Oropharynx as per pre-operative assessment  Comments: Nasal cannula with etCO2 monitoring

## 2016-12-30 NOTE — Op Note (Signed)
[facility] Gastroenterology Patient Name: Luis Porter Procedure Date: 12/30/2016 8:08 AM MRN: [id number] Account #: [id number] Date of Birth: [birth year] Admit Type: Outpatient Age: 56 Room: [facility] ENDO ROOM 4 Gender: Male Note Status: Finalized Procedure:            Colonoscopy Indications:          High risk colon cancer surveillance: Personal history                        of colonic polyps Providers:            [name] MD, MD Referring MD:         No Local Md, MD (Referring MD) Medicines:            Monitored Anesthesia Care Complications:        No immediate complications. Procedure:            Pre-Anesthesia Assessment:                       - Prior to the procedure, a History and Physical was                        performed, and patient medications, allergies and                        sensitivities were reviewed. The patient's tolerance of                        previous anesthesia was reviewed.                       - The risks and benefits of the procedure and the                        sedation options and risks were discussed with the                        patient. All questions were answered and informed                        consent was obtained.                       - ASA Grade Assessment: II - A patient with mild                        systemic disease.                       After obtaining informed consent, the colonoscope was                        passed under direct vision. Throughout the procedure,                        the patient's blood pressure, pulse, and oxygen                        saturations were monitored continuously. The  Colonoscope was introduced through the anus and                        advanced to the the cecum, identified by the                        appendiceal orifice, IC valve and transillumination.                        The colonoscopy was performed with ease. The patient          tolerated the procedure well. The quality of the bowel                        preparation was good. Findings:      Multiple small-mouthed diverticula were found in the entire colon.      Three sessile polyps were found in the descending colon, transverse       colon and cecum. The polyps were 3 to 6 mm in size. These polyps were       removed with a cold snare. Resection and retrieval were complete.       Descending colon polyp was not retrieved.      Three sessile polyps were found in the sigmoid colon. The polyps were 3       to 5 mm in size. These polyps were removed with a cold snare. Resection       and retrieval were complete.      The exam was otherwise without abnormality. Impression:           - Diverticulosis in the entire examined colon.                       - Three 3 to 6 mm polyps in the descending colon, in                        the transverse colon and in the cecum, removed with a                        cold snare. Resected and retrieved.                       - Three 3 to 5 mm polyps in the sigmoid colon, removed                        with a cold snare. Resected and retrieved.                       - The examination was otherwise normal. Recommendation:       - Discharge patient to home (with escort).                       - Resume previous diet.                       - Continue present medications.                       - Await pathology results.                       -  Repeat colonoscopy in 3 years for surveillance. Procedure Code(s):    --- Professional ---                       [phone], Colonoscopy, flexible; with removal of tumor(s),                        polyp(s), or other lesion(s) by snare technique Diagnosis Code(s):    --- Professional ---                       Z86.010, Personal history of colonic polyps                       D12.4, Benign neoplasm of descending colon                       D12.3, Benign neoplasm of transverse colon (hepatic                         flexure or splenic flexure)                       D12.0, Benign neoplasm of cecum                       D12.5, Benign neoplasm of sigmoid colon                       K57.30, Diverticulosis of large intestine without                        perforation or abscess without bleeding CPT copyright 2016 American Medical Association. All rights reserved. The codes documented in this report are preliminary and upon coder review may  be revised to meet current compliance requirements. [name], MD [name] MD, MD 12/30/2016 8:44:28 AM This report has been signed electronically. Number of Addenda: 0 Note Initiated On: 12/30/2016 8:08 AM Scope Withdrawal Time: 0 hours 15 minutes 1 second  Total Procedure Duration: 0 hours 18 minutes 36 seconds       [facility]

## 2016-12-30 NOTE — Transfer of Care (Signed)
Immediate Anesthesia Transfer of Care Note  Patient: Luis Porter  Procedure(s) Performed: Procedure(s): COLONOSCOPY WITH PROPOFOL (N/A) ESOPHAGOGASTRODUODENOSCOPY (EGD) WITH PROPOFOL (N/A)  Patient Location: PACU and Endoscopy Unit  Anesthesia Type:General  Level of Consciousness: sedated  Airway & Oxygen Therapy: Patient Spontanous Breathing and Patient connected to nasal cannula oxygen  Post-op Assessment: Report given to RN and Post -op Vital signs reviewed and stable  Post vital signs: Reviewed and stable  Last Vitals:  Vitals:   12/30/16 0717 12/30/16 0851  BP: (!) 155/95 (!) 129/92  Pulse: 97 84  Resp: 18 18  Temp: 30.0 C     Complications: No apparent anesthesia complications

## 2016-12-30 NOTE — Anesthesia Preprocedure Evaluation (Signed)
Anesthesia Evaluation  Patient identified by MRN, date of birth, ID band Patient awake    Reviewed: Allergy & Precautions, NPO status , Patient's Chart, lab work & pertinent test results, reviewed documented beta blocker date and time   Airway Mallampati: II  TM Distance: >3 FB     Dental  (+) Chipped   Pulmonary former smoker,           Cardiovascular hypertension, Pt. on medications      Neuro/Psych    GI/Hepatic hiatal hernia, PUD, GERD  ,  Endo/Other    Renal/GU      Musculoskeletal   Abdominal   Peds  Hematology   Anesthesia Other Findings   Reproductive/Obstetrics                             Anesthesia Physical Anesthesia Plan  ASA: II  Anesthesia Plan: General   Post-op Pain Management:    Induction: Intravenous  PONV Risk Score and Plan:   Airway Management Planned:   Additional Equipment:   Intra-op Plan:   Post-operative Plan:   Informed Consent: I have reviewed the patients History and Physical, chart, labs and discussed the procedure including the risks, benefits and alternatives for the proposed anesthesia with the patient or authorized representative who has indicated his/her understanding and acceptance.     Plan Discussed with: CRNA  Anesthesia Plan Comments:         Anesthesia Quick Evaluation

## 2016-12-30 NOTE — Anesthesia Post-op Follow-up Note (Cosign Needed)
Anesthesia QCDR form completed.        

## 2017-01-02 ENCOUNTER — Encounter: Payer: Self-pay | Admitting: Gastroenterology

## 2017-01-02 LAB — SURGICAL PATHOLOGY

## 2017-01-05 ENCOUNTER — Encounter: Payer: Self-pay | Admitting: Gastroenterology

## 2017-01-06 ENCOUNTER — Encounter: Payer: Self-pay | Admitting: Nurse Practitioner

## 2017-01-06 DIAGNOSIS — Z86010 Personal history of colonic polyps: Secondary | ICD-10-CM | POA: Insufficient documentation

## 2017-01-12 ENCOUNTER — Ambulatory Visit: Payer: Commercial Managed Care - PPO | Admitting: Nurse Practitioner

## 2017-01-17 ENCOUNTER — Encounter: Payer: Self-pay | Admitting: Nurse Practitioner

## 2017-01-17 ENCOUNTER — Ambulatory Visit (INDEPENDENT_AMBULATORY_CARE_PROVIDER_SITE_OTHER): Payer: Commercial Managed Care - PPO | Admitting: Nurse Practitioner

## 2017-01-17 VITALS — BP 134/72 | HR 90 | Temp 98.7°F | Ht 66.0 in | Wt 165.8 lb

## 2017-01-17 DIAGNOSIS — G4700 Insomnia, unspecified: Secondary | ICD-10-CM | POA: Diagnosis not present

## 2017-01-17 DIAGNOSIS — K219 Gastro-esophageal reflux disease without esophagitis: Secondary | ICD-10-CM | POA: Diagnosis not present

## 2017-01-17 DIAGNOSIS — K449 Diaphragmatic hernia without obstruction or gangrene: Secondary | ICD-10-CM

## 2017-01-17 DIAGNOSIS — I10 Essential (primary) hypertension: Secondary | ICD-10-CM

## 2017-01-17 MED ORDER — AMLODIPINE BESY-BENAZEPRIL HCL 10-40 MG PO CAPS: 1.0000 | ORAL_CAPSULE | Freq: Every day | ORAL | 6 refills | Status: DC

## 2017-01-17 NOTE — Progress Notes (Signed)
Subjective:    Patient ID: Luis Porter, male    DOB: Jul 22, 1960, 56 y.o.   MRN: [id number]  Luis Porter is a 56 y.o. male presenting on 01/17/2017 for Hypertension   HPI  Hypertension - He is not checking BP at home or outside of clinic.    - Current medications: amlodipine-benazepril 10-20 mg once daily, tolerating well without side effects - Pt denies headache, lightheadedness, dizziness, changes in vision, chest tightness/pressure, palpitations, leg swelling, sudden loss of speech or loss of consciousness. - He  reports an exercise routine that includes situps/pushups for 5 minutes HR elevation, 3 days per week.  Active at work 15 mins 2-3 times per day w/ HR elevation. Otherwise sedentary. - His diet is low in salt, moderate in fat, and moderate in carbohydrates.  GERD Esophageal inflammation on EGD w/ stomach polyp negative for cancer.  EGD performed 12/30/2016.  Pt is taking Protonix 40 mg bid and tolerating well w/o breakthrough acid reflux.  Insomnia Uses trazodone about 2x per month for sleep onset.  Has no significant complaints at this time.  Social History  Substance Use Topics  . Smoking status: Former Smoker    Packs/day: 2.00    Years: 25.00    Types: Cigarettes    Quit date: 05/31/1987  . Smokeless tobacco: Never Used  . Alcohol use Yes     Comment: 2 cans of beer daily    Review of Systems Per HPI unless specifically indicated above     Objective:    BP 134/72 (BP Location: Right Arm, Patient Position: Sitting, Cuff Size: Normal)   Pulse 90   Temp 98.7 F (37.1 C) (Oral)   Ht 5\' 6"  (1.676 m)   Wt 165 lb 12.8 oz (75.2 kg)   BMI 26.76 kg/m    Wt Readings from Last 3 Encounters:  01/17/17 165 lb 12.8 oz (75.2 kg)  12/30/16 160 lb (72.6 kg)  11/08/16 161 lb 9.6 oz (73.3 kg)    Physical Exam  General - healthy, well-appearing, NAD HEENT - Normocephalic, atraumatic Heart - RRR, no murmurs heard Lungs - Clear throughout all lobes, no wheezing,  crackles, or rhonchi. Normal work of breathing. Extremeties - non-tender, no edema, cap refill < 2 seconds, peripheral pulses intact +2 bilaterally Skin - warm, dry Neuro - awake, alert, oriented x3, normal gait Psych - Normal mood and affect, normal behavior   Results for orders placed or performed during the hospital encounter of 12/30/16  Surgical pathology  Result Value Ref Range   SURGICAL PATHOLOGY      Surgical Pathology CASE: [id number] PATIENT: Gwyndolyn Saxon Bazar Surgical Pathology Report     SPECIMEN SUBMITTED: A. Stomach polyp, removal; cbx B. GEJ; cbx C. Colon polyp, cecum; cold snare D. Colon polyp, transverse; cold snare E. Colon polyp x3, sigmoid; cold snare  CLINICAL HISTORY: None provided  PRE-OPERATIVE DIAGNOSIS: History of polyps, GERD and hiatal hernia  POST-OPERATIVE DIAGNOSIS: Gastric polyp, irregular GEJ, colon polyp, diverticulosis     DIAGNOSIS: A. STOMACH POLYP; COLD BIOPSY: - HYPERPLASTIC GASTRIC POLYP. - NEGATIVE FOR H. PYLORI, DYSPLASIA, AND MALIGNANCY.  B. GEJ; COLD BIOPSY: - REFLUX GASTROESOPHAGITIS. - NEGATIVE FOR DYSPLASIA AND MALIGNANCY.  C. COLON POLYP, CECUM; COLD SNARE: - TUBULAR ADENOMA. - NEGATIVE FOR HIGH GRADE DYSPLASIA AND MALIGNANCY.  D. COLON POLYP, TRANSVERSE; COLD SNARE: - TUBULAR ADENOMA. - NEGATIVE FOR HIGH GRADE DYSPLASIA AND MALIGNANCY.  E. COLON POLYP 3, SIGMOID; COLD SNARE: - TUBULAR ADENOMA (4 F RAGMENTS). - NEGATIVE FOR HIGH GRADE  DYSPLASIA AND MALIGNANCY.   GROSS DESCRIPTION:  A. Labeled: C BX/removal gastric polyp  Tissue fragment(s): 2  Size: 0.1 and 0.4 cm  Description: tan fragments  Entirely submitted in 1 cassette(s).   B. Labeled: C BX GEJ  Tissue fragment(s): 2  Size: 0.1 and 0.2 cm  Description: tan fragments  Entirely submitted in one cassette(s).  C. Labeled: C snare cecal polyp  Tissue fragment(s): multiple  Size: aggregate, 0.4 x 0.2 x 0.1 cm  Description: tan to  green fragments of tissue/material  Entirely submitted in 1 cassette(s).  D. Labeled: C snare transverse colon polyp  Tissue fragment(s): multiple  Size: aggregate, 0.3 x 0.2 x 0.1 cm  Description: tan-green fragments of tissue/material  Entirely submitted in 1 cassette(s).  E. Labeled: C snare sigmoid colon polyp 2  Tissue fragment(s): multiple  Size: aggregate, 1.0 x 0.2 x 0.1 cm  Description: pink fragments and green fecal material  Entirely sub mitted in one cassette(s).    Final Diagnosis performed by Quay Burow, MD.  Electronically signed 01/02/2017 9:06:47AM    The electronic signature indicates that the named Attending Pathologist has evaluated the specimen  Technical component performed at [facility], [address] [place], [place] [address] Lab: [phone] Dir: Keymari Sato. Evette Doffing, MD  Professional component performed at [facility], [facility], [place], [place], [place] [address] Lab: [phone] Dir: Dellia Nims. Reuel Derby, MD        Assessment & Plan:   Problem List Items Addressed This Visit      Cardiovascular and Mediastinum   Essential hypertension    Controlled.  Pt taking only amlodipine-benazepril 10-20 mg once daily.  He  is not checking BP at home.  Has not taken hydralazine any since last visit. - No complications noted w/ last labs at annual physical.  Plan: 1. Stop hydralazine.  Start checking BP at home and notify if > 130/80. 2. Continue amlodipine-benazepril 10-20 mg one daily. 3. Follow up 6 months.      Relevant Medications   amLODipine-benazepril (LOTREL) 10-40 MG capsule     Digestive   Hiatal hernia with gastroesophageal reflux - Primary    Esophagitis present.  Currently well controlled w/ Protonix 40 mg bid.  Plan: 1. Continue protonix.  Consider reducing dose at next visit. 2. Maintain regular follow up w/ GI if indicated. 3. Follow up in 6 months.        Other   Insomnia    Stable and  well controlled.  Takes Trazodone 2 x per month.  Plan: 1. Continue trazodone as needed.  Promote good sleep hygiene. 2. Follow up as needed.         Meds ordered this encounter  Medications  . amLODipine-benazepril (LOTREL) 10-40 MG capsule    Sig: Take 1 capsule by mouth daily.    Dispense:  30 capsule    Refill:  6    Order Specific Question:   Supervising Provider    Answer:   Olin Hauser [[id number]]      Follow up plan: Return in about 6 months (around 07/20/2017) for blood pressure.    Cassell Smiles, DNP, AGPCNP-BC Adult Gerontology Primary Care Nurse Practitioner [facility] 01/19/2017, 1:32 PM

## 2017-01-17 NOTE — Patient Instructions (Addendum)
Bill, Thank you for coming in to clinic today.  1. CHECK BP 1-2 x per week for next 2-4 weeks then send a message or call clinic w/ these readings. GOAL is less than 130/80 and higher than 100/60. - Change amlodipine-benazepril to 10-40 mg once daily.  Please schedule a follow-up appointment with Cassell Smiles, AGNP. Return in about 6 months (around 07/20/2017) for blood pressure.  If you have any other questions or concerns, please feel free to call the clinic or send a message through [place]. You may also schedule an earlier appointment if necessary.  You will receive a survey after today's visit either digitally by e-mail or paper by [employer]. Your experiences and feedback matter to [place].  Please respond so we know how we are doing as we provide care for you.   Cassell Smiles, DNP, AGNP-BC Adult Gerontology Nurse Practitioner [place]

## 2017-01-19 NOTE — Assessment & Plan Note (Signed)
Stable and well controlled.  Takes Trazodone 2 x per month.  Plan: 1. Continue trazodone as needed.  Promote good sleep hygiene. 2. Follow up as needed.

## 2017-01-19 NOTE — Assessment & Plan Note (Addendum)
Controlled.  Pt taking only amlodipine-benazepril 10-20 mg once daily.  He is not checking BP at home.  Has not taken hydralazine any since last visit. - No complications noted w/ last labs at annual physical.  Plan: 1. Stop hydralazine.  Start checking BP at home and notify if > 130/80. 2. Continue amlodipine-benazepril 10-20 mg one daily. 3. Follow up 6 months.

## 2017-01-19 NOTE — Progress Notes (Signed)
I have reviewed this encounter including the documentation in this note and/or discussed this patient with the provider, Cassell Smiles, AGPCNP-BC. I am certifying that I agree with the content of this note as supervising physician.  Nobie Putnam, [facility] 01/19/2017, 6:06 PM

## 2017-01-19 NOTE — Assessment & Plan Note (Addendum)
Esophagitis present.  Currently well controlled w/ Protonix 40 mg bid.  Plan: 1. Continue protonix.  Consider reducing dose at next visit. 2. Maintain regular follow up w/ GI if indicated. 3. Follow up in 6 months.

## 2017-03-22 ENCOUNTER — Encounter: Payer: Self-pay | Admitting: Nurse Practitioner

## 2017-04-04 ENCOUNTER — Encounter: Payer: Self-pay | Admitting: Nurse Practitioner

## 2017-04-22 ENCOUNTER — Other Ambulatory Visit: Payer: Self-pay | Admitting: Nurse Practitioner

## 2017-06-21 ENCOUNTER — Other Ambulatory Visit: Payer: Self-pay | Admitting: Nurse Practitioner

## 2017-06-21 DIAGNOSIS — I10 Essential (primary) hypertension: Secondary | ICD-10-CM

## 2017-06-26 ENCOUNTER — Other Ambulatory Visit: Payer: Self-pay

## 2017-06-26 MED ORDER — OMEPRAZOLE 40 MG PO CPDR: 40.0000 mg | DELAYED_RELEASE_CAPSULE | Freq: Two times a day (BID) | ORAL | 0 refills | Status: DC

## 2017-06-27 ENCOUNTER — Other Ambulatory Visit: Payer: Self-pay

## 2017-06-27 MED ORDER — OMEPRAZOLE 40 MG PO CPDR: 40.0000 mg | DELAYED_RELEASE_CAPSULE | Freq: Two times a day (BID) | ORAL | 1 refills | Status: DC

## 2017-08-02 ENCOUNTER — Other Ambulatory Visit: Payer: Self-pay

## 2017-08-02 MED ORDER — OMEPRAZOLE 40 MG PO CPDR: 40.0000 mg | DELAYED_RELEASE_CAPSULE | Freq: Two times a day (BID) | ORAL | 2 refills | Status: DC

## 2017-09-11 ENCOUNTER — Other Ambulatory Visit: Payer: Self-pay | Admitting: Family Medicine

## 2017-09-11 DIAGNOSIS — I10 Essential (primary) hypertension: Secondary | ICD-10-CM

## 2017-09-15 ENCOUNTER — Other Ambulatory Visit: Payer: Self-pay | Admitting: Nurse Practitioner

## 2017-09-15 DIAGNOSIS — I10 Essential (primary) hypertension: Secondary | ICD-10-CM

## 2017-10-05 ENCOUNTER — Ambulatory Visit (INDEPENDENT_AMBULATORY_CARE_PROVIDER_SITE_OTHER): Payer: Commercial Managed Care - PPO | Admitting: Nurse Practitioner

## 2017-10-05 ENCOUNTER — Other Ambulatory Visit: Payer: Self-pay

## 2017-10-05 ENCOUNTER — Encounter: Payer: Self-pay | Admitting: Nurse Practitioner

## 2017-10-05 VITALS — BP 113/60 | HR 88 | Temp 98.4°F | Ht 66.0 in | Wt 156.2 lb

## 2017-10-05 DIAGNOSIS — L989 Disorder of the skin and subcutaneous tissue, unspecified: Secondary | ICD-10-CM

## 2017-10-05 MED ORDER — CLOTRIMAZOLE 1 % EX CREA: 1.0000 "application " | TOPICAL_CREAM | Freq: Two times a day (BID) | CUTANEOUS | 0 refills | Status: AC

## 2017-10-05 NOTE — Progress Notes (Signed)
Subjective:    Patient ID: Luis Porter, male    DOB: Jul 12, 1960, 56 y.o.   MRN: [id number]  Luis Porter is a 57 y.o. male presenting on 10/05/2017 for skin fungal (on the top of the right foot x 104mths. No itching or pain, but pt reports that his right foot is numb, due to nerve damage.)  HPI Skin Lesion Pt notes red skin lesion on dorsal foot x 6 months with no noticeable change over that time.  No change in size, color, or texture.  He notes no pain, but also notes he has nerve damage and has difficulty with sensation of feet. He has not applied anything other than lotion to his feet during this period of time.   Social History   Tobacco Use  . Smoking status: Former Smoker    Packs/day: 2.00    Years: 25.00    Pack years: 50.00    Types: Cigarettes    Last attempt to quit: 05/31/1987    Years since quitting: 30.3  . Smokeless tobacco: Never Used  Substance Use Topics  . Alcohol use: Yes    Comment: 2 cans of beer daily  . Drug use: No    Review of Systems Per HPI unless specifically indicated above     Objective:    BP 113/60 (BP Location: Right Arm, Patient Position: Sitting, Cuff Size: Normal)   Pulse 88   Temp 98.4 F (36.9 C) (Oral)   Ht 5\' 6"  (1.676 m)   Wt 156 lb 3.2 oz (70.9 kg)   BMI 25.21 kg/m   Wt Readings from Last 3 Encounters:  10/05/17 156 lb 3.2 oz (70.9 kg)  01/17/17 165 lb 12.8 oz (75.2 kg)  12/30/16 160 lb (72.6 kg)    Physical Exam  Constitutional: He is oriented to person, place, and time. He appears well-developed and well-nourished. No distress.  HENT:  Head: Normocephalic and atraumatic.  Cardiovascular: Normal rate and regular rhythm.  Neurological: He is alert and oriented to person, place, and time.  Skin: Skin is warm and dry.  Lesion as pictured on dorsal surface of Right foot at level of tarsal for 2nd digit measuring 10 mm x 6 mm   Psychiatric: He has a normal mood and affect. His behavior is normal.  Vitals reviewed.           Assessment & Plan:   Problem List Items Addressed This Visit    None    Visit Diagnoses    Skin lesion of foot    -  Primary   Relevant Medications   clotrimazole ([employer] CLOTRIMAZOLE) 1 % cream   Other Relevant Orders   Ambulatory referral to Dermatology    Skin lesion of foot consistent with possible basal cell carcinoma vs fungal infection x last 6 months.  Less suspicious of fungal infection given no worsening or spread.   Plan: 1.  START clotrimazole 1% cream twice daily x 14 days. 2. Referral dermatology for evaluation and removal as needed. 3. 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 4. Followup as needed.  Meds ordered this encounter  Medications  . clotrimazole ([employer] CLOTRIMAZOLE) 1 % cream    Sig: Apply 1 application topically 2 (two) times daily for 14 days.    Dispense:  30 g    Refill:  0    Order Specific Question:   Supervising Provider  Answer:   Olin Hauser [[id number]]    Follow up plan: Return if symptoms worsen or fail to improve.  Cassell Smiles, DNP, AGPCNP-BC Adult Gerontology Primary Care Nurse Practitioner [facility] 10/05/2017, 10:19 AM

## 2017-10-05 NOTE — Patient Instructions (Addendum)
Petr Mounsey,   Thank you for coming in to clinic today.  1. Try two week course of Lotrimin or clotrimazole ointment for antifungal.  Place on skin twice daily for 14 days.  2. You likely have a basal cell carcinoma or actinic keratosis.  I am sending a referral to dermatology with Dr. Aubery Lapping.  DERMATOLOGY [facility] - Dr. Ree Edman   [address] [place], [place] [address] Phone: [phone]  Please schedule a follow-up appointment with Cassell Smiles, AGNP. Return if symptoms worsen or fail to improve.  If you have any other questions or concerns, please feel free to call the clinic or send a message through [place]. You may also schedule an earlier appointment if necessary.  You will receive a survey after today's visit either digitally by e-mail or paper by [employer]. Your experiences and feedback matter to [place].  Please respond so we know how we are doing as we provide care for you.   Cassell Smiles, DNP, AGNP-BC Adult Gerontology Nurse Practitioner [place]

## 2017-10-07 ENCOUNTER — Other Ambulatory Visit: Payer: Self-pay | Admitting: Nurse Practitioner

## 2017-10-07 DIAGNOSIS — I10 Essential (primary) hypertension: Secondary | ICD-10-CM

## 2017-10-11 ENCOUNTER — Encounter: Payer: Self-pay | Admitting: Nurse Practitioner

## 2018-03-31 ENCOUNTER — Other Ambulatory Visit: Payer: Self-pay | Admitting: Nurse Practitioner

## 2018-03-31 DIAGNOSIS — I10 Essential (primary) hypertension: Secondary | ICD-10-CM

## 2018-05-14 ENCOUNTER — Other Ambulatory Visit: Payer: Self-pay

## 2018-05-15 ENCOUNTER — Other Ambulatory Visit: Payer: Self-pay

## 2018-05-22 ENCOUNTER — Other Ambulatory Visit: Payer: Self-pay | Admitting: Nurse Practitioner

## 2018-05-22 DIAGNOSIS — I10 Essential (primary) hypertension: Secondary | ICD-10-CM

## 2018-05-25 ENCOUNTER — Other Ambulatory Visit: Payer: Self-pay

## 2018-05-30 DIAGNOSIS — E119 Type 2 diabetes mellitus without complications: Secondary | ICD-10-CM | POA: Diagnosis not present

## 2018-06-04 ENCOUNTER — Other Ambulatory Visit: Payer: Self-pay

## 2018-06-04 ENCOUNTER — Ambulatory Visit (INDEPENDENT_AMBULATORY_CARE_PROVIDER_SITE_OTHER): Payer: Commercial Managed Care - PPO | Admitting: Nurse Practitioner

## 2018-06-04 ENCOUNTER — Encounter: Payer: Self-pay | Admitting: Nurse Practitioner

## 2018-06-04 VITALS — BP 125/80 | HR 114 | Temp 98.3°F | Resp 17 | Ht 66.0 in | Wt 167.0 lb

## 2018-06-04 DIAGNOSIS — E782 Mixed hyperlipidemia: Secondary | ICD-10-CM | POA: Diagnosis not present

## 2018-06-04 DIAGNOSIS — K219 Gastro-esophageal reflux disease without esophagitis: Secondary | ICD-10-CM

## 2018-06-04 DIAGNOSIS — L989 Disorder of the skin and subcutaneous tissue, unspecified: Secondary | ICD-10-CM

## 2018-06-04 DIAGNOSIS — K449 Diaphragmatic hernia without obstruction or gangrene: Secondary | ICD-10-CM

## 2018-06-04 DIAGNOSIS — I10 Essential (primary) hypertension: Secondary | ICD-10-CM | POA: Diagnosis not present

## 2018-06-04 DIAGNOSIS — K429 Umbilical hernia without obstruction or gangrene: Secondary | ICD-10-CM

## 2018-06-04 MED ORDER — AMLODIPINE BESY-BENAZEPRIL HCL 10-40 MG PO CAPS: ORAL_CAPSULE | ORAL | 4 refills | Status: DC

## 2018-06-04 MED ORDER — OMEPRAZOLE 40 MG PO CPDR
40.0000 mg | DELAYED_RELEASE_CAPSULE | Freq: Two times a day (BID) | ORAL | 4 refills | Status: DC
Start: 2018-06-04 — End: 2019-06-07

## 2018-06-04 NOTE — Patient Instructions (Addendum)
Luis Porter,   Thank you for coming in to clinic today.  1. DERMATOLOGY [facility]  [address] [place], [place] [address] Phone: [phone]  2. GENERAL SURGERY [facility] [address] [place],  [place]  [address] Phone: [phone]   3. Labs today.  Results to MyChart in about 2 days.  4. Continue all current meds without changes.  Please schedule a follow-up appointment with Cassell Smiles, AGNP. Return in about 1 year (around 06/05/2019).  If you have any other questions or concerns, please feel free to call the clinic or send a message through [place]. You may also schedule an earlier appointment if necessary.  You will receive a survey after today's visit either digitally by e-mail or paper by [employer]. Your experiences and feedback matter to [place].  Please respond so we know how we are doing as we provide care for you.   Cassell Smiles, DNP, AGNP-BC Adult Gerontology Nurse Practitioner [place]

## 2018-06-04 NOTE — Progress Notes (Signed)
Subjective:    Patient ID: Luis Porter, male    DOB: 1961/02/20, 58 y.o.   MRN: [id number]  Luis Porter is a 58 y.o. male presenting on 06/04/2018 for Hypertension   HPI Hypertension - He is checking BP at home or outside of clinic.  Readings rarely > 130 and is usually at work.  Otherwise is less than 130/80 - Current medications: amlodipine-benazepril 10-40 mg once daily, tolerating well without side effects - He is not currently symptomatic. - Pt denies headache, lightheadedness, dizziness, changes in vision, chest tightness/pressure, palpitations, leg swelling, sudden loss of speech or loss of consciousness. - He  reports an exercise routine that includes walking 5-6 miles at work, 5 days per week. - His diet is moderate in salt, moderate in fat, and moderate in carbohydrates.   Hyperlipidemia Mild elevations last labs, more significant triglyceride elevation.  Patient is not currently on medications. Did not get last labs drawn.  Is fasting today.   GERD Patient is taking omeprazole.  Is controlling heartburn with omeprazole 40 mg bid.  with hiatal hernia. And history of esophageal tear  Umbilical hernia Continues getting larger, never has been painful, red, hard. - would be repeat repair as he has had it repaired in past (likely > 10 years ago).  Hand skin lesion Precancerous or Cancerous skin lesion on foot started similarly to this.  Requests dermatology repeat visit.  Last visit < 1 year ago.  Skin lesion now with more tenderness, raised/rough and flaky.  Social History   Tobacco Use  . Smoking status: Former Smoker    Packs/day: 2.00    Years: 25.00    Pack years: 50.00    Types: Cigarettes    Last attempt to quit: 05/31/1987    Years since quitting: 31.0  . Smokeless tobacco: Never Used  Substance Use Topics  . Alcohol use: Yes    Comment: 2 cans of beer daily  . Drug use: No    Review of Systems Per HPI unless specifically indicated above       Objective:    BP 125/80 (BP Location: Right Arm, Patient Position: Sitting, Cuff Size: Normal)   Pulse (!) 114   Temp 98.3 F (36.8 C) (Oral)   Resp 17   Ht 5\' 6"  (1.676 m)   Wt 167 lb (75.8 kg)   SpO2 98%   BMI 26.95 kg/m   Wt Readings from Last 3 Encounters:  06/04/18 167 lb (75.8 kg)  10/05/17 156 lb 3.2 oz (70.9 kg)  01/17/17 165 lb 12.8 oz (75.2 kg)    Physical Exam Vitals signs reviewed.  Constitutional:      General: He is awake. He is not in acute distress.    Appearance: He is well-developed.  HENT:     Head: Normocephalic and atraumatic.  Neck:     Musculoskeletal: Normal range of motion and neck supple.     Vascular: No carotid bruit.  Cardiovascular:     Rate and Rhythm: Normal rate and regular rhythm.     Pulses:          Radial pulses are 2+ on the right side and 2+ on the left side.       Posterior tibial pulses are 2+ on the right side and 2+ on the left side.     Heart sounds: Normal heart sounds, S1 normal and S2 normal.  Pulmonary:     Effort: Pulmonary effort is normal. No respiratory distress.     Breath  sounds: Normal breath sounds and air entry.  Abdominal:     Hernia: A hernia is present. Hernia is present in the umbilical area (not reducible causes mild tenderness with pressure).  Musculoskeletal:     Right lower leg: No edema.     Left lower leg: No edema.  Skin:    General: Skin is warm and dry.     Capillary Refill: Capillary refill takes less than 2 seconds.     Comments: Left hand skin lesion with raised borders, redness, flaking.  Neurological:     Mental Status: He is alert and oriented to person, place, and time.  Psychiatric:        Attention and Perception: Attention normal.        Mood and Affect: Mood and affect normal.        Behavior: Behavior normal. Behavior is cooperative.         Assessment & Plan:   Problem List Items Addressed This Visit      Cardiovascular and Mediastinum   Essential hypertension Controlled  hypertension.  BP goal < 130/80.  Pt is working on lifestyle modifications to eat low salt diet, stay active.  Taking medications tolerating well without side effects. No currently identified complications.  Plan: 1. Continue taking amlodipine-benazepril daily without changes. 2. Obtain labs today  3. Encouraged heart healthy diet and increasing exercise to 30 minutes most days of the week. 4. Check BP 1-2 x per week at home, keep log, and bring to clinic at next appointment. 5. Follow up 12 months and for annual physical in about 6 months is recommended.   Relevant Medications   amLODipine-benazepril (LOTREL) 10-40 MG capsule   Other Relevant Orders   COMPLETE METABOLIC PANEL WITH GFR     Respiratory   Hiatal hernia with gastroesophageal reflux - Primary Currently well controlled on omeprazole 40 mg once daily. This is high dose for chronic use.  Patient has had esophageal erosion and perforation in past. Continues to have hiatal hernia and does not wish to currently reduce medication  Plan: 1. Continue omeprazole 40 mg once daily. Side effects discussed. Pt wants to continue med.  Agree with this for risk-benefit analysis would be worse to have repeat esophageal tear. 2. Avoid diet triggers. Reviewed need to seek care if globus sensation, difficulty swallowing, s/sx of GI bleed. 3. Follow up as needed and in 1 year.    Relevant Medications   omeprazole (PRILOSEC) 40 MG capsule   Other Relevant Orders   CBC with Differential/Platelet     Other   Hyperlipidemia, mixed Status unknown.  Recheck labs.  Continue without medications.  Followup prn after labs and 1 year.   Relevant Medications   amLODipine-benazepril (LOTREL) 10-40 MG capsule   Other Relevant Orders   COMPLETE METABOLIC PANEL WITH GFR   Lipid panel   Umbilical hernia Recurrent, previously repaired.  Not currently with complication or incarceration.  Hernia is not currently reducible and has tenderness with moderately  firm palpation.  Plan: 1. Referral general surgery for non-emergent consult.  Patient unsure he wants repair due to recovery process. - Reviewed signs and symptoms of emergency and when to seek urgent repair. - Encouraged patient to continue with consult to discuss concerns about recovery.  Lack of reducibility is slightly more concerning for possible future complication if not repaired. 2. Follow-up prn.   Relevant Orders   Ambulatory referral to General Surgery    Other Visit Diagnoses    Skin lesion  Cannot exclude skin cancer.  Patient with previous precancerous/cancerous lesion on foot with similar symptoms.  Patient requests repeat referral to dermatology.  Placed.  Also encouraged patient to call as he is likely existing patient.   Relevant Orders   Ambulatory referral to Dermatology      Meds ordered this encounter  Medications  . amLODipine-benazepril (LOTREL) 10-40 MG capsule    Sig: TAKE 1 CAPSULE BY MOUTH EVERY DAY    Dispense:  90 capsule    Refill:  4    Order Specific Question:   Supervising Provider    Answer:   Olin Hauser [[id number]]  . omeprazole (PRILOSEC) 40 MG capsule    Sig: Take 1 capsule (40 mg total) by mouth 2 (two) times daily.    Dispense:  180 capsule    Refill:  4    Order Specific Question:   Supervising Provider    Answer:   Olin Hauser [[id number]]    Follow up plan: Return in about 1 year (around 06/05/2019).  Cassell Smiles, DNP, AGPCNP-BC Adult Gerontology Primary Care Nurse Practitioner [facility] 06/04/2018, 9:24 AM

## 2018-06-05 LAB — COMPLETE METABOLIC PANEL WITH GFR
AG Ratio: 1.3 (calc) (ref 1.0–2.5)
ALT: 29 U/L (ref 9–46)
AST: 40 U/L — ABNORMAL HIGH (ref 10–35)
Albumin: 4.4 g/dL (ref 3.6–5.1)
Alkaline phosphatase (APISO): 74 U/L (ref 40–115)
BUN: 8 mg/dL (ref 7–25)
CO2: 22 mmol/L (ref 20–32)
Calcium: 9.5 mg/dL (ref 8.6–10.3)
Chloride: 100 mmol/L (ref 98–110)
Creat: 0.71 mg/dL (ref 0.70–1.33)
GFR, Est African American: 122 mL/min/{1.73_m2} (ref >=60)
GFR, Est Non African American: 105 mL/min/{1.73_m2} (ref >=60)
Globulin: 3.5 g/dL (calc) (ref 1.9–3.7)
Glucose, Bld: 90 mg/dL (ref 65–99)
Potassium: 4 mmol/L (ref 3.5–5.3)
Sodium: 136 mmol/L (ref 135–146)
Total Bilirubin: 0.5 mg/dL (ref 0.2–1.2)
Total Protein: 7.9 g/dL (ref 6.1–8.1)

## 2018-06-05 LAB — CBC WITH DIFFERENTIAL/PLATELET
Absolute Monocytes: 1061 cells/uL — ABNORMAL HIGH (ref 200–950)
Basophils Absolute: 189 cells/uL (ref 0–200)
Basophils Relative: 1.8 %
Eosinophils Absolute: 105 cells/uL (ref 15–500)
Eosinophils Relative: 1 %
HCT: 47.2 % (ref 38.5–50.0)
Hemoglobin: 16.8 g/dL (ref 13.2–17.1)
Lymphs Abs: 2027 cells/uL (ref 850–3900)
MCH: 33 pg (ref 27.0–33.0)
MCHC: 35.6 g/dL (ref 32.0–36.0)
MCV: 92.7 fL (ref 80.0–100.0)
MPV: 9.2 fL (ref 7.5–12.5)
Monocytes Relative: 10.1 %
Neutro Abs: 7119 cells/uL (ref 1500–7800)
Neutrophils Relative %: 67.8 %
Platelets: 357 10*3/uL (ref 140–400)
RBC: 5.09 10*6/uL (ref 4.20–5.80)
RDW: 13.3 % (ref 11.0–15.0)
Total Lymphocyte: 19.3 %
WBC: 10.5 10*3/uL (ref 3.8–10.8)

## 2018-06-05 LAB — LIPID PANEL
Cholesterol: 230 mg/dL — ABNORMAL HIGH (ref <200)
HDL: 69 mg/dL (ref >40)
LDL Cholesterol (Calc): 131 mg/dL (calc) — ABNORMAL HIGH
Non-HDL Cholesterol (Calc): 161 mg/dL (calc) — ABNORMAL HIGH (ref <130)
Total CHOL/HDL Ratio: 3.3 (calc) (ref <5.0)
Triglycerides: 165 mg/dL — ABNORMAL HIGH (ref <150)

## 2018-06-14 ENCOUNTER — Other Ambulatory Visit: Payer: Self-pay

## 2018-06-14 ENCOUNTER — Ambulatory Visit (INDEPENDENT_AMBULATORY_CARE_PROVIDER_SITE_OTHER): Payer: Commercial Managed Care - PPO | Admitting: Surgery

## 2018-06-14 ENCOUNTER — Encounter: Payer: Self-pay | Admitting: Surgery

## 2018-06-14 VITALS — BP 150/80 | HR 88 | Temp 97.9°F | Resp 12 | Ht 66.0 in | Wt 167.0 lb

## 2018-06-14 DIAGNOSIS — K420 Umbilical hernia with obstruction, without gangrene: Secondary | ICD-10-CM | POA: Diagnosis not present

## 2018-06-14 NOTE — Patient Instructions (Signed)
You have requested for your Umbilical Hernia be repaired. This has been scheduled with  Dr.Jason Rosana Hoes at [facility] will need to arrange to be off work for 1-2 weeks but will have to have a lifting restriction of no more than 15 lbs for 6 weeks following your surgery.   Umbilical Hernia, Adult A hernia is a bulge of tissue that pushes through an opening between muscles. An umbilical hernia happens in the abdomen, near the belly button (umbilicus). The hernia may contain tissues from the small intestine, large intestine, or fatty tissue covering the intestines (omentum). Umbilical hernias in adults tend to get worse over time, and they require surgical treatment. There are several types of umbilical hernias. You may have:  A hernia located just above or below the umbilicus (indirect hernia). This is the most common type of umbilical hernia in adults.  A hernia that forms through an opening formed by the umbilicus (direct hernia).  A hernia that comes and goes (reducible hernia). A reducible hernia may be visible only when you strain, lift something heavy, or cough. This type of hernia can be pushed back into the abdomen (reduced).  A hernia that traps abdominal tissue inside the hernia (incarcerated hernia). This type of hernia cannot be reduced.  A hernia that cuts off blood flow to the tissues inside the hernia (strangulated hernia). The tissues can start to die if this happens. This type of hernia requires emergency treatment.  What are the causes? An umbilical hernia happens when tissue inside the abdomen presses on a weak area of the abdominal muscles. What increases the risk? You may have a greater risk of this condition if you:  Are obese.  Have had several pregnancies.  Have a buildup of fluid inside your abdomen (ascites).  Have had surgery that weakens the abdominal muscles.  What are the signs or symptoms? The main symptom of this condition is a painless  bulge at or near the belly button. A reducible hernia may be visible only when you strain, lift something heavy, or cough. Other symptoms may include:  Dull pain.  A feeling of pressure.  Symptoms of a strangulated hernia may include:  Pain that gets increasingly worse.  Nausea and vomiting.  Pain when pressing on the hernia.  Skin over the hernia becoming red or purple.  Constipation.  Blood in the stool.  How is this diagnosed? This condition may be diagnosed based on:  A physical exam. You may be asked to cough or strain while standing. These actions increase the pressure inside your abdomen and force the hernia through the opening in your muscles. Your health care provider may try to reduce the hernia by pressing on it.  Your symptoms and medical history.  How is this treated? Surgery is the only treatment for an umbilical hernia. Surgery for a strangulated hernia is done as soon as possible. If you have a small hernia that is not incarcerated, you may need to lose weight before having surgery. Follow these instructions at home:  Lose weight, if told by your health care provider.  Do not try to push the hernia back in.  Watch your hernia for any changes in color or size. Tell your health care provider if any changes occur.  You may need to avoid activities that increase pressure on your hernia.  Do not lift anything that is heavier than 10 lb (4.5 kg) until your health care provider says that this is safe.  Take over-the-counter and prescription medicines only as told by your health care provider.  Keep all follow-up visits as told by your health care provider. This is important. Contact a health care provider if:  Your hernia gets larger.  Your hernia becomes painful. Get help right away if:  You develop sudden, severe pain near the area of your hernia.  You have pain as well as nausea or vomiting.  You have pain and the skin over your hernia changes  color.  You develop a fever. This information is not intended to replace advice given to you by your health care provider. Make sure you discuss any questions you have with your health care provider. Document Released: 10/16/2015 Document Revised: 01/17/2016 Document Reviewed: 10/16/2015 Elsevier Interactive Patient Education  [employer].

## 2018-06-14 NOTE — Progress Notes (Signed)
Surgical Clinic History and Physical  Referring provider:  Mikey College, NP [place], [place] [address]  HISTORY OF PRESENT ILLNESS (HPI):  58 y.o. male presents for evaluation of his recurrent and increasingly painful umbilical hernia. Patient reports he previously underwent open repair of his umbilical hernia with mesh in [place], [place] 30 years ago, 5 years after which his hernia recurred following some heavy lifting. Over the past 5 years or so, it has become increasingly painful and more difficult to reduce, having more recently become non-reducible. Patient denies any history of constipation, urinary straining, or frequent coughing, states that he is very active and frequently walks or runs several miles each week. His most recent colonoscopy was 12/2016. While he previously smoked up to 2 packs per day, he says he quit smoking in 1989 and denies any CP or SOB.  PAST MEDICAL HISTORY (PMH):  Past Medical History:  Diagnosis Date  . Diverticulosis   . Gastric ulcer   . GERD (gastroesophageal reflux disease)   . History of hiatal hernia   . Hypertension     PAST SURGICAL HISTORY ([place]):  Past Surgical History:  Procedure Laterality Date  . COLONOSCOPY WITH PROPOFOL N/A 12/30/2016   Procedure: COLONOSCOPY WITH PROPOFOL;  Surgeon: [name], MD;  Location: [facility] ENDOSCOPY;  Service: Endoscopy;  Laterality: N/A;  . ESOPHAGOGASTRODUODENOSCOPY (EGD) WITH PROPOFOL N/A 12/30/2016   Procedure: ESOPHAGOGASTRODUODENOSCOPY (EGD) WITH PROPOFOL;  Surgeon: [name], MD;  Location: [facility] ENDOSCOPY;  Service: Endoscopy;  Laterality: N/A;  . HERNIA REPAIR  [id number]   Umbilical Hernia Repair  . LUMBAR LAMINECTOMY/DECOMPRESSION MICRODISCECTOMY Right 05/18/2016   Procedure: right L4-5 microdiscectomy;  Surgeon: Blanche East, MD;  Location: [facility] ORS;  Service: Neurosurgery;  Laterality: Right;  . TONSILLECTOMY      MEDICATIONS:  Prior to Admission medications   Medication Sig Start  Date End Date Taking? Authorizing Provider  amLODipine-benazepril (LOTREL) 10-40 MG capsule TAKE 1 CAPSULE BY MOUTH EVERY DAY 06/04/18  Yes Mikey College, NP  omeprazole (PRILOSEC) 40 MG capsule Take 1 capsule (40 mg total) by mouth 2 (two) times daily. 06/04/18  Yes Mikey College, NP    ALLERGIES:  Allergies  Allergen Reactions  . Nsaids Other (See Comments)    Due to history of ulcers Due to history of ulcers    SOCIAL HISTORY:  Social History   Socioeconomic History  . Marital status: Divorced    Spouse name: Not on file  . Number of children: Not on file  . Years of education: Not on file  . Highest education level: Not on file  Occupational History  . Not on file  Social Needs  . Financial resource strain: Not on file  . Food insecurity:    Worry: Not on file    Inability: Not on file  . Transportation needs:    Medical: Not on file    Non-medical: Not on file  Tobacco Use  . Smoking status: Former Smoker    Packs/day: 2.00    Years: 25.00    Pack years: 50.00    Types: Cigarettes    Last attempt to quit: 05/31/1987    Years since quitting: 31.0  . Smokeless tobacco: Never Used  Substance and Sexual Activity  . Alcohol use: Yes    Comment: 2 cans of beer daily  . Drug use: No  . Sexual activity: Yes    Comment: parners birth control  Lifestyle  . Physical activity:    Days per  week: Not on file    Minutes per session: Not on file  . Stress: Not on file  Relationships  . Social connections:    Talks on phone: Not on file    Gets together: Not on file    Attends religious service: Not on file    Active member of club or organization: Not on file    Attends meetings of clubs or organizations: Not on file    Relationship status: Not on file  . Intimate partner violence:    Fear of current or ex partner: Not on file    Emotionally abused: Not on file    Physically abused: Not on file    Forced sexual activity: Not on file  Other Topics Concern   . Not on file  Social History Narrative  . Not on file    The patient currently resides (home / rehab facility / nursing home): Home The patient normally is (ambulatory / bedbound): Ambulatory  FAMILY HISTORY:  Family History  Problem Relation Age of Onset  . Hypertension Mother   . Cancer Mother        Ovarian  . Hypertension Father   . Cancer Father        tongue  . Hypertension Brother   . Prostate cancer Neg Hx   . Breast cancer Neg Hx   . Colon cancer Neg Hx   . Heart attack Neg Hx   . Stroke Neg Hx     Otherwise negative/non-contributory.  REVIEW OF SYSTEMS:  Constitutional: denies any other weight loss, fever, chills, or sweats  Eyes: denies any other vision changes, history of eye injury  ENT: denies sore throat, hearing problems  Respiratory: denies shortness of breath, wheezing  Cardiovascular: denies chest pain, palpitations  Gastrointestinal: abdominal pain, N/V, and bowel function as per HPI Musculoskeletal: denies any other joint pains or cramps  Skin: Denies any other rashes or skin discolorations Neurological: denies any other headache, dizziness, weakness  Psychiatric: Denies any other depression, anxiety   All other review of systems were otherwise negative   VITAL SIGNS:  BP (!) 150/80   Pulse 88   Temp 97.9 F (36.6 C) (Skin)   Resp 12   Ht 5\' 6"  (1.676 m)   Wt 167 lb (75.8 kg)   SpO2 95%   BMI 26.95 kg/m   PHYSICAL EXAM:  Constitutional:  -- Normal body habitus  -- Awake, alert, and oriented x3  Eyes:  -- Pupils equally round and reactive to light  -- No scleral icterus  Ear, nose, throat:  -- No jugular venous distension -- No nasal drainage, bleeding Pulmonary:  -- No crackles  -- Equal breath sounds bilaterally -- Breathing non-labored at rest Cardiovascular:  -- S1, S2 present  -- No pericardial rubs  Gastrointestinal:  -- Abdomen soft and non-distended with focal peri-umbilical tenderness to palpation only with  unsuccessful attempted reduction of non-reducible umbilical hernia, no guarding/rebound tenderness -- No other abdominal masses appreciated, pulsatile or otherwise  Musculoskeletal and Integumentary:  -- Wounds or skin discoloration: None appreciated -- Extremities: B/L UE and LE FROM, hands and feet warm, no edema  Neurologic:  -- Motor function: Intact and symmetric -- Sensation: Intact and symmetric  Labs:  CBC Latest Ref Rng & Units 06/04/2018 11/14/2016 05/18/2016  WBC 3.8 - 10.8 Thousand/uL 10.5 6.4 13.8(H)  Hemoglobin 13.2 - 17.1 g/dL 16.8 16.1 14.5  Hematocrit 38.5 - 50.0 % 47.2 47.3 41.1  Platelets 140 - 400 Thousand/uL 357 288  286   CMP Latest Ref Rng & Units 06/04/2018 11/14/2016 05/18/2016  Glucose 65 - 99 mg/dL 90 89 86  BUN 7 - 25 mg/dL 8 7 -  Creatinine 0.70 - 1.33 mg/dL 0.71 0.61(L) 0.70  Sodium 135 - 146 mmol/L 136 139 139  Potassium 3.5 - 5.3 mmol/L 4.0 3.7 3.8  Chloride 98 - 110 mmol/L 100 102 -  CO2 20 - 32 mmol/L 22 23 -  Calcium 8.6 - 10.3 mg/dL 9.5 8.8 -  Total Protein 6.1 - 8.1 g/dL 7.9 7.0 -  Total Bilirubin 0.2 - 1.2 mg/dL 0.5 0.5 -  Alkaline Phos 40 - 115 U/L - 63 -  AST 10 - 35 U/L 40(H) 23 -  ALT 9 - 46 U/L 29 22 -   Imaging studies: No recent pertinent imaging studies available for review at this time  Assessment/Plan: 58 y.o. male with increasingly symptomatic recurrent non-reducible/incarcerated umbilical hernia, complicated by pertinent comorbidities including HTN, hiatal hernia with well-controlled GERD and history of PUD, former tobacco abuse (smoking).    - maintain hydration with high fiber heart healthy diet to reduce/minimize constipation +/- daily stool softener as needed             - all risks, benefits, and alternatives to laparoscopic repair of increasingly symptomatic recurrent non-reducible non-obstructing umbilical hernia with mesh were discussed with the patient, all of his questions were answered to patient's expressed satisfaction,  patient expresses he wishes to proceed, and informed consent was obtained.             - will plan for laparoscopic repair of patient's increasingly symptomatic (painful) recurrent non-reducible non-obstructing umbilical hernia with mesh per patient request pending anesthesia and OR availability             - anticipate return to clinic 2 weeks after above planned surgery             - instructed to call if any questions or concerns  All of the above recommendations were discussed with the patient, and all of patient's questions were answered to his expressed satisfaction.  Thank you for the opportunity to participate in this patient's care.  -- Marilynne Drivers Rosana Hoes, MD, [place]: [facility] - Partnering for exceptional care. Office: [phone]

## 2018-06-14 NOTE — H&P (View-Only) (Signed)
Surgical Clinic History and Physical  Referring provider:  Mikey College, NP [place], [place] [address]  HISTORY OF PRESENT ILLNESS (HPI):  58 y.o. male presents for evaluation of his recurrent and increasingly painful umbilical hernia. Patient reports he previously underwent open repair of his umbilical hernia with mesh in [place], [place] 30 years ago, 5 years after which his hernia recurred following some heavy lifting. Over the past 5 years or so, it has become increasingly painful and more difficult to reduce, having more recently become non-reducible. Patient denies any history of constipation, urinary straining, or frequent coughing, states that he is very active and frequently walks or runs several miles each week. His most recent colonoscopy was 12/2016. While he previously smoked up to 2 packs per day, he says he quit smoking in 1989 and denies any CP or SOB.  PAST MEDICAL HISTORY (PMH):  Past Medical History:  Diagnosis Date  . Diverticulosis   . Gastric ulcer   . GERD (gastroesophageal reflux disease)   . History of hiatal hernia   . Hypertension     PAST SURGICAL HISTORY ([place]):  Past Surgical History:  Procedure Laterality Date  . COLONOSCOPY WITH PROPOFOL N/A 12/30/2016   Procedure: COLONOSCOPY WITH PROPOFOL;  Surgeon: [name], MD;  Location: [facility] ENDOSCOPY;  Service: Endoscopy;  Laterality: N/A;  . ESOPHAGOGASTRODUODENOSCOPY (EGD) WITH PROPOFOL N/A 12/30/2016   Procedure: ESOPHAGOGASTRODUODENOSCOPY (EGD) WITH PROPOFOL;  Surgeon: [name], MD;  Location: [facility] ENDOSCOPY;  Service: Endoscopy;  Laterality: N/A;  . HERNIA REPAIR  [id number]   Umbilical Hernia Repair  . LUMBAR LAMINECTOMY/DECOMPRESSION MICRODISCECTOMY Right 05/18/2016   Procedure: right L4-5 microdiscectomy;  Surgeon: Blanche East, MD;  Location: [facility] ORS;  Service: Neurosurgery;  Laterality: Right;  . TONSILLECTOMY      MEDICATIONS:  Prior to Admission medications   Medication Sig Start  Date End Date Taking? Authorizing Provider  amLODipine-benazepril (LOTREL) 10-40 MG capsule TAKE 1 CAPSULE BY MOUTH EVERY DAY 06/04/18  Yes Mikey College, NP  omeprazole (PRILOSEC) 40 MG capsule Take 1 capsule (40 mg total) by mouth 2 (two) times daily. 06/04/18  Yes Mikey College, NP    ALLERGIES:  Allergies  Allergen Reactions  . Nsaids Other (See Comments)    Due to history of ulcers Due to history of ulcers    SOCIAL HISTORY:  Social History   Socioeconomic History  . Marital status: Divorced    Spouse name: Not on file  . Number of children: Not on file  . Years of education: Not on file  . Highest education level: Not on file  Occupational History  . Not on file  Social Needs  . Financial resource strain: Not on file  . Food insecurity:    Worry: Not on file    Inability: Not on file  . Transportation needs:    Medical: Not on file    Non-medical: Not on file  Tobacco Use  . Smoking status: Former Smoker    Packs/day: 2.00    Years: 25.00    Pack years: 50.00    Types: Cigarettes    Last attempt to quit: 05/31/1987    Years since quitting: 31.0  . Smokeless tobacco: Never Used  Substance and Sexual Activity  . Alcohol use: Yes    Comment: 2 cans of beer daily  . Drug use: No  . Sexual activity: Yes    Comment: parners birth control  Lifestyle  . Physical activity:    Days per  week: Not on file    Minutes per session: Not on file  . Stress: Not on file  Relationships  . Social connections:    Talks on phone: Not on file    Gets together: Not on file    Attends religious service: Not on file    Active member of club or organization: Not on file    Attends meetings of clubs or organizations: Not on file    Relationship status: Not on file  . Intimate partner violence:    Fear of current or ex partner: Not on file    Emotionally abused: Not on file    Physically abused: Not on file    Forced sexual activity: Not on file  Other Topics Concern   . Not on file  Social History Narrative  . Not on file    The patient currently resides (home / rehab facility / nursing home): Home The patient normally is (ambulatory / bedbound): Ambulatory  FAMILY HISTORY:  Family History  Problem Relation Age of Onset  . Hypertension Mother   . Cancer Mother        Ovarian  . Hypertension Father   . Cancer Father        tongue  . Hypertension Brother   . Prostate cancer Neg Hx   . Breast cancer Neg Hx   . Colon cancer Neg Hx   . Heart attack Neg Hx   . Stroke Neg Hx     Otherwise negative/non-contributory.  REVIEW OF SYSTEMS:  Constitutional: denies any other weight loss, fever, chills, or sweats  Eyes: denies any other vision changes, history of eye injury  ENT: denies sore throat, hearing problems  Respiratory: denies shortness of breath, wheezing  Cardiovascular: denies chest pain, palpitations  Gastrointestinal: abdominal pain, N/V, and bowel function as per HPI Musculoskeletal: denies any other joint pains or cramps  Skin: Denies any other rashes or skin discolorations Neurological: denies any other headache, dizziness, weakness  Psychiatric: Denies any other depression, anxiety   All other review of systems were otherwise negative   VITAL SIGNS:  BP (!) 150/80   Pulse 88   Temp 97.9 F (36.6 C) (Skin)   Resp 12   Ht 5\' 6"  (1.676 m)   Wt 167 lb (75.8 kg)   SpO2 95%   BMI 26.95 kg/m   PHYSICAL EXAM:  Constitutional:  -- Normal body habitus  -- Awake, alert, and oriented x3  Eyes:  -- Pupils equally round and reactive to light  -- No scleral icterus  Ear, nose, throat:  -- No jugular venous distension -- No nasal drainage, bleeding Pulmonary:  -- No crackles  -- Equal breath sounds bilaterally -- Breathing non-labored at rest Cardiovascular:  -- S1, S2 present  -- No pericardial rubs  Gastrointestinal:  -- Abdomen soft and non-distended with focal peri-umbilical tenderness to palpation only with  unsuccessful attempted reduction of non-reducible umbilical hernia, no guarding/rebound tenderness -- No other abdominal masses appreciated, pulsatile or otherwise  Musculoskeletal and Integumentary:  -- Wounds or skin discoloration: None appreciated -- Extremities: B/L UE and LE FROM, hands and feet warm, no edema  Neurologic:  -- Motor function: Intact and symmetric -- Sensation: Intact and symmetric  Labs:  CBC Latest Ref Rng & Units 06/04/2018 11/14/2016 05/18/2016  WBC 3.8 - 10.8 Thousand/uL 10.5 6.4 13.8(H)  Hemoglobin 13.2 - 17.1 g/dL 16.8 16.1 14.5  Hematocrit 38.5 - 50.0 % 47.2 47.3 41.1  Platelets 140 - 400 Thousand/uL 357 288  286   CMP Latest Ref Rng & Units 06/04/2018 11/14/2016 05/18/2016  Glucose 65 - 99 mg/dL 90 89 86  BUN 7 - 25 mg/dL 8 7 -  Creatinine 0.70 - 1.33 mg/dL 0.71 0.61(L) 0.70  Sodium 135 - 146 mmol/L 136 139 139  Potassium 3.5 - 5.3 mmol/L 4.0 3.7 3.8  Chloride 98 - 110 mmol/L 100 102 -  CO2 20 - 32 mmol/L 22 23 -  Calcium 8.6 - 10.3 mg/dL 9.5 8.8 -  Total Protein 6.1 - 8.1 g/dL 7.9 7.0 -  Total Bilirubin 0.2 - 1.2 mg/dL 0.5 0.5 -  Alkaline Phos 40 - 115 U/L - 63 -  AST 10 - 35 U/L 40(H) 23 -  ALT 9 - 46 U/L 29 22 -   Imaging studies: No recent pertinent imaging studies available for review at this time  Assessment/Plan: 58 y.o. male with increasingly symptomatic recurrent non-reducible/incarcerated umbilical hernia, complicated by pertinent comorbidities including HTN, hiatal hernia with well-controlled GERD and history of PUD, former tobacco abuse (smoking).    - maintain hydration with high fiber heart healthy diet to reduce/minimize constipation +/- daily stool softener as needed             - all risks, benefits, and alternatives to laparoscopic repair of increasingly symptomatic recurrent non-reducible non-obstructing umbilical hernia with mesh were discussed with the patient, all of his questions were answered to patient's expressed satisfaction,  patient expresses he wishes to proceed, and informed consent was obtained.             - will plan for laparoscopic repair of patient's increasingly symptomatic (painful) recurrent non-reducible non-obstructing umbilical hernia with mesh per patient request pending anesthesia and OR availability             - anticipate return to clinic 2 weeks after above planned surgery             - instructed to call if any questions or concerns  All of the above recommendations were discussed with the patient, and all of patient's questions were answered to his expressed satisfaction.  Thank you for the opportunity to participate in this patient's care.  -- Marilynne Drivers Rosana Hoes, MD, [place]: [facility] - Partnering for exceptional care. Office: [phone]

## 2018-06-18 ENCOUNTER — Encounter: Payer: Self-pay | Admitting: *Deleted

## 2018-06-18 NOTE — Progress Notes (Signed)
Patient's surgery has been scheduled for 06-29-18 at [facility] with Dr. Rosana Hoes.  The patient is aware he will be contacted by the [facility] to complete a phone interview sometime in the near future.  The patient is aware to call the office should he have further questions.

## 2018-06-22 ENCOUNTER — Inpatient Hospital Stay: Admission: RE | Admit: 2018-06-22 | Payer: Self-pay | Source: Ambulatory Visit

## 2018-06-25 ENCOUNTER — Other Ambulatory Visit: Payer: Self-pay

## 2018-06-25 ENCOUNTER — Encounter
Admission: RE | Admit: 2018-06-25 | Discharge: 2018-06-25 | Disposition: A | Discharge status: Home / Self Care | Payer: Commercial Managed Care - PPO | Source: Ambulatory Visit | Attending: Surgery | Admitting: Surgery

## 2018-06-25 NOTE — Patient Instructions (Signed)
Your procedure is scheduled on: 06-29-18 FRIDAY Report to Same Day Surgery 2nd floor medical mall [facility] Entrance-take elevator on left to 2nd floor.  Check in with surgery information desk.) To find out your arrival time please call [phone] between 1PM - 3PM on 06-28-18 THURSDAY  Remember: Instructions that are not followed completely may result in serious medical risk, up to and including death, or upon the discretion of your surgeon and anesthesiologist your surgery may need to be rescheduled.    _x___ 1. Do not eat food after midnight the night before your procedure. NO GUM OR CANDY AFTER MIDNIGHT.  You may drink clear liquids up to 2 hours before you are scheduled to arrive at the hospital for your procedure.  Do not drink clear liquids within 2 hours of your scheduled arrival to the hospital.  Clear liquids include  --Water or Apple juice without pulp  --Clear carbohydrate beverage such as ClearFast or Gatorade  --Black Coffee or Clear Tea (No milk, no creamers, do not add anything to  the coffee or Tea   ____Ensure clear carbohydrate drink on the way to the hospital for bariatric patients  ____Ensure clear carbohydrate drink 3 hours before surgery for Dr Dwyane Luo patients if physician instructed.    __x__ 2. No Alcohol for 24 hours before or after surgery.   __x__3. No Smoking or e-cigarettes for 24 prior to surgery.  Do not use any chewable tobacco products for at least 6 hour prior to surgery   ____  4. Bring all medications with you on the day of surgery if instructed.    __x__ 5. Notify your doctor if there is any change in your medical condition     (cold, fever, infections).    x___6. On the morning of surgery brush your teeth with toothpaste and water.  You may rinse your mouth with mouth wash if you wish.  Do not swallow any toothpaste or mouthwash.   Do not wear jewelry, make-up, hairpins, clips or nail polish.  Do not wear lotions, powders, or perfumes.  You may wear deodorant.  Do not shave 48 hours prior to surgery. Men may shave face and neck.  Do not bring valuables to the hospital.    [facility] is not responsible for any belongings or valuables.               Contacts, dentures or bridgework may not be worn into surgery.  Leave your suitcase in the car. After surgery it may be brought to your room.  For patients admitted to the hospital, discharge time is determined by your treatment team.  _  Patients discharged the day of surgery will not be allowed to drive home.  You will need someone to drive you home and stay with you the night of your procedure.    Please read over the following fact sheets that you were given:   [facility] Preparing for Surgery   _x___ TAKE THE FOLLOWING MEDICATION THE MORNING OF SURGERY WITH A SMALL SIP OF WATER. These include:  1. PRILOSEC (OMEPRAZOLE)  2.  3.  4.  5.  6.  ____Fleets enema or Magnesium Citrate as directed.   _x___ Use CHG Soap or sage wipes as directed on instruction sheet   ____ Use inhalers on the day of surgery and bring to hospital day of surgery  ____ Stop Metformin and Janumet 2 days prior to surgery.    ____ Take 1/2 of usual insulin dose the night  before surgery and none on the morning surgery.   ____ Follow recommendations from Cardiologist, Pulmonologist or PCP regarding stopping Aspirin, Coumadin, Plavix ,Eliquis, Effient, or Pradaxa, and Pletal.  X____Stop Anti-inflammatories such as Advil, Aleve, Ibuprofen, Motrin, Naproxen, Naprosyn, Goodies powders or aspirin products NOW-OK to take Tylenol    ____ Stop supplements until after surgery.     ____ Bring C-Pap to the hospital.

## 2018-06-28 ENCOUNTER — Other Ambulatory Visit: Payer: Self-pay

## 2018-06-28 ENCOUNTER — Encounter
Admission: RE | Admit: 2018-06-28 | Discharge: 2018-06-28 | Disposition: A | Discharge status: Home / Self Care | Payer: Commercial Managed Care - PPO | Source: Ambulatory Visit | Attending: Surgery | Admitting: Surgery

## 2018-06-28 DIAGNOSIS — I10 Essential (primary) hypertension: Secondary | ICD-10-CM

## 2018-06-28 DIAGNOSIS — Z01810 Encounter for preprocedural cardiovascular examination: Secondary | ICD-10-CM

## 2018-06-28 DIAGNOSIS — L280 Lichen simplex chronicus: Secondary | ICD-10-CM | POA: Diagnosis not present

## 2018-06-28 DIAGNOSIS — Z8711 Personal history of peptic ulcer disease: Secondary | ICD-10-CM | POA: Diagnosis not present

## 2018-06-28 DIAGNOSIS — Z886 Allergy status to analgesic agent status: Secondary | ICD-10-CM | POA: Diagnosis not present

## 2018-06-28 DIAGNOSIS — Z79899 Other long term (current) drug therapy: Secondary | ICD-10-CM | POA: Diagnosis not present

## 2018-06-28 DIAGNOSIS — K219 Gastro-esophageal reflux disease without esophagitis: Secondary | ICD-10-CM | POA: Diagnosis not present

## 2018-06-28 DIAGNOSIS — Z87891 Personal history of nicotine dependence: Secondary | ICD-10-CM | POA: Diagnosis not present

## 2018-06-28 DIAGNOSIS — K429 Umbilical hernia without obstruction or gangrene: Secondary | ICD-10-CM | POA: Diagnosis present

## 2018-06-28 DIAGNOSIS — K420 Umbilical hernia with obstruction, without gangrene: Secondary | ICD-10-CM | POA: Diagnosis not present

## 2018-06-28 MED ORDER — CEFAZOLIN SODIUM-DEXTROSE 2-4 GM/100ML-% IV SOLN
2.0000 g | INTRAVENOUS | Status: AC
  Administered 2018-06-29: 2 g via INTRAVENOUS

## 2018-06-29 ENCOUNTER — Encounter: Payer: Self-pay | Admitting: *Deleted

## 2018-06-29 ENCOUNTER — Other Ambulatory Visit: Payer: Self-pay

## 2018-06-29 ENCOUNTER — Ambulatory Visit
Admission: RE | Admit: 2018-06-29 | Discharge: 2018-06-29 | Disposition: A | Discharge status: Home / Self Care | Payer: Commercial Managed Care - PPO | Attending: Surgery | Admitting: Surgery

## 2018-06-29 ENCOUNTER — Ambulatory Visit: Payer: Commercial Managed Care - PPO | Admitting: Certified Registered"

## 2018-06-29 ENCOUNTER — Encounter
Admission: RE | Disposition: A | Discharge status: Home / Self Care | Payer: Self-pay | Source: Home / Self Care | Attending: Surgery

## 2018-06-29 DIAGNOSIS — Z886 Allergy status to analgesic agent status: Secondary | ICD-10-CM | POA: Insufficient documentation

## 2018-06-29 DIAGNOSIS — I10 Essential (primary) hypertension: Secondary | ICD-10-CM | POA: Diagnosis not present

## 2018-06-29 DIAGNOSIS — K429 Umbilical hernia without obstruction or gangrene: Secondary | ICD-10-CM | POA: Diagnosis not present

## 2018-06-29 DIAGNOSIS — K219 Gastro-esophageal reflux disease without esophagitis: Secondary | ICD-10-CM | POA: Diagnosis not present

## 2018-06-29 DIAGNOSIS — K420 Umbilical hernia with obstruction, without gangrene: Secondary | ICD-10-CM

## 2018-06-29 DIAGNOSIS — Z79899 Other long term (current) drug therapy: Secondary | ICD-10-CM | POA: Insufficient documentation

## 2018-06-29 DIAGNOSIS — Z8711 Personal history of peptic ulcer disease: Secondary | ICD-10-CM | POA: Insufficient documentation

## 2018-06-29 DIAGNOSIS — Z87891 Personal history of nicotine dependence: Secondary | ICD-10-CM | POA: Insufficient documentation

## 2018-06-29 HISTORY — PX: UMBILICAL HERNIA REPAIR: SHX196

## 2018-06-29 SURGERY — REPAIR, HERNIA, UMBILICAL, LAPAROSCOPIC
Anesthesia: General

## 2018-06-29 MED ORDER — FENTANYL CITRATE (PF) 250 MCG/5ML IJ SOLN
INTRAMUSCULAR | Status: AC
  Filled 2018-06-29: qty 5

## 2018-06-29 MED ORDER — LIDOCAINE HCL (PF) 2 % IJ SOLN
INTRAMUSCULAR | Status: AC
  Filled 2018-06-29: qty 10

## 2018-06-29 MED ORDER — ACETAMINOPHEN 500 MG PO TABS
1000.0000 mg | ORAL_TABLET | ORAL | Status: AC
  Administered 2018-06-29: 1000 mg via ORAL

## 2018-06-29 MED ORDER — PROPOFOL 10 MG/ML IV BOLUS
INTRAVENOUS | Status: DC | PRN
  Administered 2018-06-29: 130 mg via INTRAVENOUS
  Administered 2018-06-29: 40 mg via INTRAVENOUS

## 2018-06-29 MED ORDER — PROPOFOL 10 MG/ML IV BOLUS
INTRAVENOUS | Status: AC
  Filled 2018-06-29: qty 20

## 2018-06-29 MED ORDER — FENTANYL CITRATE (PF) 100 MCG/2ML IJ SOLN
25.0000 ug | INTRAMUSCULAR | Status: DC | PRN
  Administered 2018-06-29: 25 ug via INTRAVENOUS
  Administered 2018-06-29 (×2): 50 ug via INTRAVENOUS
  Administered 2018-06-29: 25 ug via INTRAVENOUS

## 2018-06-29 MED ORDER — ROCURONIUM BROMIDE 100 MG/10ML IV SOLN
INTRAVENOUS | Status: DC | PRN
  Administered 2018-06-29: 10 mg via INTRAVENOUS
  Administered 2018-06-29: 50 mg via INTRAVENOUS

## 2018-06-29 MED ORDER — ONDANSETRON HCL 4 MG/2ML IJ SOLN
INTRAMUSCULAR | Status: AC
  Filled 2018-06-29: qty 2

## 2018-06-29 MED ORDER — ROCURONIUM BROMIDE 50 MG/5ML IV SOLN
INTRAVENOUS | Status: AC
  Filled 2018-06-29: qty 1

## 2018-06-29 MED ORDER — LIDOCAINE HCL (CARDIAC) PF 100 MG/5ML IV SOSY
PREFILLED_SYRINGE | INTRAVENOUS | Status: DC | PRN
  Administered 2018-06-29: 20 mg via INTRAVENOUS
  Administered 2018-06-29: 80 mg via INTRAVENOUS

## 2018-06-29 MED ORDER — ONDANSETRON HCL 4 MG/2ML IJ SOLN
INTRAMUSCULAR | Status: DC | PRN
  Administered 2018-06-29: 4 mg via INTRAVENOUS

## 2018-06-29 MED ORDER — OXYCODONE-ACETAMINOPHEN 5-325 MG PO TABS: 1.0000 | ORAL_TABLET | ORAL | 0 refills | Status: DC | PRN

## 2018-06-29 MED ORDER — ACETAMINOPHEN 500 MG PO TABS
ORAL_TABLET | ORAL | Status: AC
  Filled 2018-06-29: qty 2

## 2018-06-29 MED ORDER — SUGAMMADEX SODIUM 200 MG/2ML IV SOLN
INTRAVENOUS | Status: DC | PRN
  Administered 2018-06-29: 200 mg via INTRAVENOUS

## 2018-06-29 MED ORDER — MIDAZOLAM HCL 2 MG/2ML IJ SOLN
INTRAMUSCULAR | Status: DC | PRN
  Administered 2018-06-29: 2 mg via INTRAVENOUS

## 2018-06-29 MED ORDER — MEPERIDINE HCL 50 MG/ML IJ SOLN: 6.2500 mg | INTRAMUSCULAR | Status: DC | PRN

## 2018-06-29 MED ORDER — DEXAMETHASONE SODIUM PHOSPHATE 10 MG/ML IJ SOLN
INTRAMUSCULAR | Status: DC | PRN
  Administered 2018-06-29: 10 mg via INTRAVENOUS

## 2018-06-29 MED ORDER — LIDOCAINE HCL 1 % IJ SOLN
INTRAMUSCULAR | Status: DC | PRN
  Administered 2018-06-29: 20 mL

## 2018-06-29 MED ORDER — CHLORHEXIDINE GLUCONATE CLOTH 2 % EX PADS: 6.0000 | MEDICATED_PAD | Freq: Once | CUTANEOUS | Status: DC

## 2018-06-29 MED ORDER — GABAPENTIN 300 MG PO CAPS
300.0000 mg | ORAL_CAPSULE | ORAL | Status: AC
  Administered 2018-06-29: 300 mg via ORAL

## 2018-06-29 MED ORDER — BUPIVACAINE HCL (PF) 0.5 % IJ SOLN
INTRAMUSCULAR | Status: AC
  Filled 2018-06-29: qty 30

## 2018-06-29 MED ORDER — OXYCODONE HCL 5 MG/5ML PO SOLN: 5.0000 mg | Freq: Once | ORAL | Status: DC | PRN

## 2018-06-29 MED ORDER — OXYCODONE HCL 5 MG PO TABS: 5.0000 mg | ORAL_TABLET | Freq: Once | ORAL | Status: DC | PRN

## 2018-06-29 MED ORDER — DEXMEDETOMIDINE HCL IN NACL 200 MCG/50ML IV SOLN
INTRAVENOUS | Status: AC
  Filled 2018-06-29: qty 50

## 2018-06-29 MED ORDER — FENTANYL CITRATE (PF) 100 MCG/2ML IJ SOLN
INTRAMUSCULAR | Status: AC
  Administered 2018-06-29: 25 ug via INTRAVENOUS
  Filled 2018-06-29: qty 2

## 2018-06-29 MED ORDER — GABAPENTIN 300 MG PO CAPS
ORAL_CAPSULE | ORAL | Status: AC
  Filled 2018-06-29: qty 1

## 2018-06-29 MED ORDER — LACTATED RINGERS IV SOLN
INTRAVENOUS | Status: DC
  Administered 2018-06-29 (×2): via INTRAVENOUS

## 2018-06-29 MED ORDER — DEXAMETHASONE SODIUM PHOSPHATE 10 MG/ML IJ SOLN
INTRAMUSCULAR | Status: AC
  Filled 2018-06-29: qty 1

## 2018-06-29 MED ORDER — ALBUTEROL SULFATE HFA 108 (90 BASE) MCG/ACT IN AERS
INHALATION_SPRAY | RESPIRATORY_TRACT | Status: DC | PRN
  Administered 2018-06-29: 3 via RESPIRATORY_TRACT

## 2018-06-29 MED ORDER — SEVOFLURANE IN SOLN
RESPIRATORY_TRACT | Status: AC
  Filled 2018-06-29: qty 250

## 2018-06-29 MED ORDER — LIDOCAINE HCL (PF) 1 % IJ SOLN
INTRAMUSCULAR | Status: AC
  Filled 2018-06-29: qty 30

## 2018-06-29 MED ORDER — FENTANYL CITRATE (PF) 100 MCG/2ML IJ SOLN
INTRAMUSCULAR | Status: AC
  Administered 2018-06-29: 50 ug via INTRAVENOUS
  Filled 2018-06-29: qty 2

## 2018-06-29 MED ORDER — ESMOLOL HCL 100 MG/10ML IV SOLN
INTRAVENOUS | Status: DC | PRN
  Administered 2018-06-29: 30 mg via INTRAVENOUS
  Administered 2018-06-29: 10 mg via INTRAVENOUS

## 2018-06-29 MED ORDER — PROMETHAZINE HCL 25 MG/ML IJ SOLN: 6.2500 mg | INTRAMUSCULAR | Status: DC | PRN

## 2018-06-29 MED ORDER — FENTANYL CITRATE (PF) 100 MCG/2ML IJ SOLN
INTRAMUSCULAR | Status: DC | PRN
  Administered 2018-06-29 (×2): 50 ug via INTRAVENOUS
  Administered 2018-06-29: 25 ug via INTRAVENOUS
  Administered 2018-06-29 (×2): 50 ug via INTRAVENOUS
  Administered 2018-06-29: 25 ug via INTRAVENOUS

## 2018-06-29 MED ORDER — MIDAZOLAM HCL 2 MG/2ML IJ SOLN
INTRAMUSCULAR | Status: AC
  Filled 2018-06-29: qty 2

## 2018-06-29 SURGICAL SUPPLY — 41 items
BLADE SURG SZ11 CARB STEEL (BLADE) ×2 IMPLANT
CANISTER SUCT 1200ML W/VALVE (MISCELLANEOUS) ×2 IMPLANT
CHLORAPREP W/TINT 26ML (MISCELLANEOUS) ×2 IMPLANT
COVER WAND RF STERILE (DRAPES) ×2 IMPLANT
DERMABOND ADVANCED (GAUZE/BANDAGES/DRESSINGS) ×1
DERMABOND ADVANCED .7 DNX12 (GAUZE/BANDAGES/DRESSINGS) ×1 IMPLANT
DEVICE SECURE STRAP 25 ABSORB (INSTRUMENTS) ×4 IMPLANT
ELECT REM PT RETURN 9FT ADLT (ELECTROSURGICAL) ×2
ELECTRODE REM PT RTRN 9FT ADLT (ELECTROSURGICAL) ×1 IMPLANT
ETHIBOND 2 0 GREEN CT 2 30IN (SUTURE) IMPLANT
GLOVE BIO SURGEON STRL SZ7 (GLOVE) ×2 IMPLANT
GLOVE INDICATOR 7.5 STRL GRN (GLOVE) ×2 IMPLANT
GOWN STRL REUS W/ TWL LRG LVL3 (GOWN DISPOSABLE) ×1 IMPLANT
GOWN STRL REUS W/ TWL XL LVL3 (GOWN DISPOSABLE) ×1 IMPLANT
GOWN STRL REUS W/TWL LRG LVL3 (GOWN DISPOSABLE) ×1
GOWN STRL REUS W/TWL XL LVL3 (GOWN DISPOSABLE) ×1
GRASPER SUT TROCAR 14GX15 (MISCELLANEOUS) ×2 IMPLANT
IRRIGATION STRYKERFLOW (MISCELLANEOUS) ×1 IMPLANT
IRRIGATOR STRYKERFLOW (MISCELLANEOUS) ×2
IV NS 1000ML (IV SOLUTION) ×1
IV NS 1000ML BAXH (IV SOLUTION) ×1 IMPLANT
KIT TURNOVER KIT A (KITS) ×2 IMPLANT
LABEL OR SOLS (LABEL) ×2 IMPLANT
MESH VENTRALIGHT ST 4X6IN (Mesh General) ×2 IMPLANT
NEEDLE HYPO 22GX1.5 SAFETY (NEEDLE) ×2 IMPLANT
NEEDLE VERESS 14GA 120MM (NEEDLE) ×2 IMPLANT
PACK LAP CHOLECYSTECTOMY (MISCELLANEOUS) ×2 IMPLANT
SCISSORS METZENBAUM CVD 33 (INSTRUMENTS) IMPLANT
SET TUBE SMOKE EVAC HIGH FLOW (TUBING) ×2 IMPLANT
SHEARS HARMONIC ACE PLUS 36CM (ENDOMECHANICALS) IMPLANT
SLEEVE ENDOPATH XCEL 5M (ENDOMECHANICALS) ×2 IMPLANT
SUT MNCRL 4-0 (SUTURE) ×1
SUT MNCRL 4-0 27XMFL (SUTURE) ×1
SUT VIC AB 3-0 SH 27 (SUTURE) ×1
SUT VIC AB 3-0 SH 27X BRD (SUTURE) ×1 IMPLANT
SUT VICRYL 0 AB UR-6 (SUTURE) ×2 IMPLANT
SUTURE MNCRL 4-0 27XMF (SUTURE) ×1 IMPLANT
SYR 10ML LL (SYRINGE) ×4 IMPLANT
TRAY FOLEY MTR SLVR 16FR STAT (SET/KITS/TRAYS/PACK) IMPLANT
TROCAR XCEL 12X100 BLDLESS (ENDOMECHANICALS) ×2 IMPLANT
TROCAR XCEL NON-BLD 5MMX100MML (ENDOMECHANICALS) ×6 IMPLANT

## 2018-06-29 NOTE — Transfer of Care (Signed)
Immediate Anesthesia Transfer of Care Note  Patient: Luis Porter  Procedure(s) Performed: LAPAROSCOPIC REPAIR OF RECURRENT UMBILICAL HERNIA WITH MESH (N/A )  Patient Location: PACU  Anesthesia Type:General  Level of Consciousness: awake, alert  and oriented  Airway & Oxygen Therapy: Patient connected to face mask oxygen  Post-op Assessment: Post -op Vital signs reviewed and stable  Post vital signs: stable  Last Vitals:  Vitals Value Taken Time  BP 137/66 06/29/2018  3:39 PM  Temp 37.3 C 06/29/2018  3:38 PM  Pulse 107 06/29/2018  3:39 PM  Resp 17 06/29/2018  3:39 PM  SpO2 100 % 06/29/2018  3:39 PM    Last Pain:  Vitals:   06/29/18 1132  TempSrc: Oral  PainSc:          Complications: No apparent anesthesia complications

## 2018-06-29 NOTE — Anesthesia Postprocedure Evaluation (Signed)
Anesthesia Post Note  Patient: Luis Porter  Procedure(s) Performed: LAPAROSCOPIC REPAIR OF RECURRENT UMBILICAL HERNIA WITH MESH (N/A )  Patient location during evaluation: PACU Anesthesia Type: General Level of consciousness: awake and alert Pain management: pain level controlled Vital Signs Assessment: post-procedure vital signs reviewed and stable Respiratory status: spontaneous breathing, nonlabored ventilation, respiratory function stable and patient connected to nasal cannula oxygen Cardiovascular status: blood pressure returned to baseline and stable Postop Assessment: no apparent nausea or vomiting Anesthetic complications: no     Last Vitals:  Vitals:   06/29/18 1618 06/29/18 1626  BP: (!) 141/92 (!) 146/77  Pulse: 98 99  Resp: (!) 22 18  Temp:  37.2 C  SpO2: 93% 94%    Last Pain:  Vitals:   06/29/18 1626  TempSrc: Temporal  PainSc: 7                  Broadus John K Piscitello

## 2018-06-29 NOTE — Discharge Instructions (Signed)
In addition to included general post-operative instructions for Laparoscopic Repair of Recurrent Umbilical Hernia with mesh,  Diet: Resume home heart healthy diet.   Activity: No heavy lifting >15 - 20 pounds (children, pets, laundry, garbage) or strenuous activity until follow-up, but light activity and walking are encouraged. Do not drive or drink alcohol if taking narcotic pain medications.  Wound care: 2 days after surgery (Sunday afternoon, 2/2), you may shower/get incision wet with soapy water and pat dry (do not rub incisions), but no baths or submerging incision underwater until follow-up.   Medications: Resume all home medications. For mild to moderate pain: acetaminophen (Tylenol) or ibuprofen/naproxen (if no kidney disease). Combining Tylenol with alcohol can substantially increase your risk of causing liver disease. Narcotic pain medications, if prescribed, can be used for severe pain, though may cause nausea, constipation, and drowsiness. Do not combine Tylenol and Percocet (or similar) within a 6 hour period as Percocet (and similar) contain(s) Tylenol. If you do not need the narcotic pain medication, you do not need to fill the prescription.  Call office [phone]) at any time if any questions, worsening pain, fevers/chills, bleeding, drainage from incision site, or other concerns.  AMBULATORY SURGERY  DISCHARGE INSTRUCTIONS   1) The drugs that you were given will stay in your system until tomorrow so for the next 24 hours you should not:  A) Drive an automobile B) Make any legal decisions C) Drink any alcoholic beverage   2) You may resume regular meals tomorrow.  Today it is better to start with liquids and gradually work up to solid foods.  You may eat anything you prefer, but it is better to start with liquids, then soup and crackers, and gradually work up to solid foods.   3) Please notify your doctor immediately if you have any unusual bleeding, trouble breathing,  redness and pain at the surgery site, drainage, fever, or pain not relieved by medication.    4) Additional Instructions:        Please contact your physician with any problems or Same Day Surgery at [phone], Monday through Friday 6 am to 4 pm, or [place] at [facility] number at [phone].

## 2018-06-29 NOTE — Op Note (Signed)
SURGICAL OPERATIVE REPORT   DATE OF PROCEDURE: 06/29/2018  ATTENDING Surgeon(s): Vickie Epley, MD  ASSISTANT(S): Floyce Stakes, RNFA   ANESTHESIA: GETA  PRE-OPERATIVE DIAGNOSIS: Reducible recurrent 2 cm x 2 cm umbilical hernia (FIE-33'I: K42.9)   POST-OPERATIVE DIAGNOSIS: Incarcerated recurrent 2 cm x 2 cm umbilical hernia (RJJ-88'C: K42.9)   PROCEDURE(S): (cpt: 16606) 1.) Laparoscopic repair of umbilical hernia with 4 cm x 6 cm Bard Ventralight ST single-side coated mesh   INTRAOPERATIVE FINDINGS: Incarcerated recurrent 2 cm x 2 cm umbilical hernia containing omentum and moderate peritoneal hernia sac at the time of laparoscopic surgery (not reduced by insufflation)   INTRAVENOUS FLUIDS: 1000 mL crystalloid    ESTIMATED BLOOD LOSS: Minimal (<20 mL)   URINE OUTPUT: No Foley   SPECIMENS: No Specimen   IMPLANTS: Bard 4 cm x 6 cm Ventralight ST coated mesh   DRAINS: none   COMPLICATIONS: None apparent   CONDITION AT END OF PROCEDURE: Hemodynamically stable and extubated   DISPOSITION OF PATIENT: PACU   INDICATIONS FOR PROCEDURE:  Patient is a 58 y.o. male who recently presented to outpatient surgical office for evaluation and management of his increasingly painful recurrent umbilical, which was repaired initially 30 years ago and recurred 25 years ago. More recently, it has become more difficult to reduce and increasingly painful. All risks, benefits, and alternatives to above procedure were discussed with the patient, all of patient's questions were answered to his expressed satisfaction, and informed consent was obtained and documented.   DETAILS OF PROCEDURE: Patient was brought to the operating suite and appropriately identified. General anesthesia was administered along with appropriate pre-operative antibiotics, and endotracheal intubation was performed by anesthetist. In supine position, operative site was prepped and draped in the usual sterile fashion, and  following a brief time out, local anesthetic was injected subcutaneously over Palmer's point 3 cm inferior to the subcostal margin along the mid-clavicular line, at which point a 5 mm incision was made using a #11 blade scalpel. Fascia was elevated, and a Verress needle was inserted, and its proper position was confirmed using aspiration and saline meniscus test.   Upon insufflation of the abdominal cavity with carbon dioxide to a well-tolerated pressure of 12-15 mmHg, a 5 mm LUQ port followed by a 5 mm 30-degree laparoscope were inserted and used to inspect the abdominal cavity and its contents with no injuries from insertion of the first trochar noted and confirmation of pre-operative diagnosis. Two additional trocars were inserted along the Left side of the abdomen, the middle one being a 12 mm port (through which to insert the hernia repair mesh) and the lower one a second 5 mm port. The hernia contents were then ultimately laparoscopically reduced without resection of incarcerated omentum or hernia sac. Spinal needle was then used to percutaneously identify superior, inferior, and two lateral sites which were each marked for placement of lateral anchoring sutures with at least 2 - 3 cm mesh extension beyond the the margins of the hernia defect. Appropriately sized mesh was selected, the inward-facing coated side of the mesh was marked to designate 4 quadrants, and a 0-0 Ethibond braided non-absorbable suture was secured at least 1 cm from each of 4 corners to serve as trans-fascial anchoring sutures. The mesh was wet briefly, rolled, and inserted through the 12 mm port into the abdominal cavity, where it was unrolled, re-oriented, and kept with coated side down.   PMI laparoscopic fascial closure device was advanced through 2 mm incisions made at each of  4 markings made to signify the hernia repair mesh edges (at least 1 cm away from Ethibond sutures placed at least 1 cm from the edge of the mesh) and used  to externalize the anchoring sutures, which were each tied at one time after all had been externalized and upon confirmation of enough tension on the mesh in a trampoline-like fashion.  laparoscopic tacking device was then used to circumferentially secure the edges of the mesh. One additional 5 mm Right-sided port was inserted under direct laparoscopic visualization to facilitate circumferential tacking. Hemostasis and secure placement of hernia repair mesh were confirmed, and intra-peritoneal cavity was inspected with no additional findings.   PMI laparoscopic fascial closure device was then used to re-approximate fascia at the 12 mm port site. All ports were then removed under direct visualization, and the abdominal cavity was desuflated. All port sites were irrigated/cleaned, additional local anesthetic was injected at each incision, 3-0 Vicryl was used to re-approximate dermis at 12 mm port site, and subcuticular 4-0 Monocryl suture was used to re-approximate skin. Skin was then cleaned, dried, and sterile skin glue was applied. Patient was then safely able to be extubated, awakened, and transferred to PACU for post-operative monitoring and care.   I was present for all aspects of the above procedure, and no operative complications were apparent.

## 2018-06-29 NOTE — Anesthesia Procedure Notes (Signed)
Procedure Name: Intubation Date/Time: 06/29/2018 1:55 PM Performed by: Jackalyn Lombard, RN Pre-anesthesia Checklist: Patient identified, Emergency Drugs available, Suction available and Patient being monitored Patient Re-evaluated:Patient Re-evaluated prior to induction Oxygen Delivery Method: Circle system utilized Preoxygenation: Pre-oxygenation with 100% oxygen Induction Type: IV induction Ventilation: Two handed mask ventilation required and Oral airway inserted - appropriate to patient size Laryngoscope Size: Sabra Heck and 2 Grade View: Grade I Tube type: Oral Tube size: 7.5 mm Number of attempts: 1 Airway Equipment and Method: Stylet Placement Confirmation: ETT inserted through vocal cords under direct vision,  positive ETCO2 and breath sounds checked- equal and bilateral Secured at: 22 (teeth) cm Tube secured with: Tape Dental Injury: Teeth and Oropharynx as per pre-operative assessment

## 2018-06-29 NOTE — Anesthesia Post-op Follow-up Note (Signed)
Anesthesia QCDR form completed.        

## 2018-06-29 NOTE — Anesthesia Preprocedure Evaluation (Signed)
Anesthesia Evaluation  Patient identified by MRN, date of birth, ID band Patient awake    Reviewed: Allergy & Precautions, NPO status , Patient's Chart, lab work & pertinent test results  History of Anesthesia Complications Negative for: history of anesthetic complications  Airway Mallampati: II  TM Distance: >3 FB Neck ROM: Full    Dental no notable dental hx.    Pulmonary neg sleep apnea, neg COPD, former smoker,    breath sounds clear to auscultation- rhonchi (-) wheezing      Cardiovascular Exercise Tolerance: Good hypertension, Pt. on medications (-) CAD, (-) Past MI, (-) Cardiac Stents and (-) CABG  Rhythm:Regular Rate:Normal - Systolic murmurs and - Diastolic murmurs    Neuro/Psych neg Seizures negative neurological ROS  negative psych ROS   GI/Hepatic Neg liver ROS, hiatal hernia, PUD, GERD  ,  Endo/Other  negative endocrine ROSneg diabetes  Renal/GU negative Renal ROS     Musculoskeletal negative musculoskeletal ROS (+)   Abdominal (+) - obese,   Peds  Hematology negative hematology ROS (+)   Anesthesia Other Findings Past Medical History: No date: Diverticulosis No date: Gastric ulcer No date: GERD (gastroesophageal reflux disease) No date: History of hiatal hernia No date: Hypertension   Reproductive/Obstetrics                             Anesthesia Physical Anesthesia Plan  ASA: II  Anesthesia Plan: General   Post-op Pain Management:    Induction: Intravenous  PONV Risk Score and Plan: 1 and Ondansetron and Midazolam  Airway Management Planned: Oral ETT  Additional Equipment:   Intra-op Plan:   Post-operative Plan: Extubation in OR  Informed Consent: I have reviewed the patients History and Physical, chart, labs and discussed the procedure including the risks, benefits and alternatives for the proposed anesthesia with the patient or authorized representative  who has indicated his/her understanding and acceptance.     Dental advisory given  Plan Discussed with: CRNA and Anesthesiologist  Anesthesia Plan Comments:         Anesthesia Quick Evaluation

## 2018-06-29 NOTE — Interval H&P Note (Signed)
History and Physical Interval Note:  06/29/2018 1:28 PM  Luis Porter  has presented today for surgery, with the diagnosis of UMBILICAL HERNIA  The various methods of treatment have been discussed with the patient and family. After consideration of risks, benefits and other options for treatment, the patient has consented to  Procedure(s): [place] (N/A) as a surgical intervention .  The patient's history has been reviewed, patient examined, no change in status, stable for surgery.  I have reviewed the patient's chart and labs.  Questions were answered to the patient's satisfaction.     Vickie Epley

## 2018-06-30 DIAGNOSIS — E119 Type 2 diabetes mellitus without complications: Secondary | ICD-10-CM | POA: Diagnosis not present

## 2018-07-02 ENCOUNTER — Encounter: Payer: Self-pay | Admitting: Surgery

## 2018-07-12 ENCOUNTER — Encounter: Payer: Self-pay | Admitting: Surgery

## 2018-07-12 ENCOUNTER — Other Ambulatory Visit: Payer: Self-pay

## 2018-07-12 ENCOUNTER — Ambulatory Visit (INDEPENDENT_AMBULATORY_CARE_PROVIDER_SITE_OTHER): Payer: Commercial Managed Care - PPO | Admitting: Surgery

## 2018-07-12 VITALS — BP 150/77 | HR 118 | Temp 98.1°F | Resp 16 | Ht 66.0 in | Wt 166.4 lb

## 2018-07-12 DIAGNOSIS — K420 Umbilical hernia with obstruction, without gangrene: Secondary | ICD-10-CM

## 2018-07-12 DIAGNOSIS — Z4889 Encounter for other specified surgical aftercare: Secondary | ICD-10-CM

## 2018-07-12 NOTE — Progress Notes (Signed)
Surgical Clinic Progress/Follow-up Note   HPI:  58 y.o. Male presents to clinic for post-op follow-up 2 weeks s/p laparoscopic repair of his incarcerated recurrent and increasingly symptomatic umbilical hernia with mesh Rosana Hoes, 06/29/2018). Patient reports complete resolution of pre-operative pain following 3 - 4 days of peri-operative abdominal pain, since which he's discontinued any pain medications and has been tolerating regular diet with +flatus and normal BM's, denies N/V, fever/chills, CP, or SOB.  Review of Systems:  Constitutional: denies fever/chills  Respiratory: denies shortness of breath, wheezing  Cardiovascular: denies chest pain, palpitations  Gastrointestinal: abdominal pain, N/V, and bowel function as per interval history Skin: Denies any other rashes or skin discolorations except post-surgical wounds as per interval history  Vital Signs:  BP (!) 150/77   Pulse (!) 118   Temp 98.1 F (36.7 C) (Temporal)   Resp 16   Ht 5\' 6"  (1.676 m)   Wt 166 lb 6.4 oz (75.5 kg)   SpO2 95%   BMI 26.86 kg/m    Physical Exam:  Constitutional:  -- Normal body habitus  -- Awake, alert, and oriented x3  Pulmonary:  -- No crackles -- Equal breath sounds bilaterally -- Breathing non-labored at rest Cardiovascular:  -- S1, S2 present  -- No pericardial rubs  Gastrointestinal:  -- Soft and non-distended, non-tender to palpation, no guarding/rebound tenderness -- Post-surgical incisions all well-approximated without any peri-incisional erythema or drainage -- small firm non-tender to palpation supra-umbilical subcutaneous fluid without surrounding erythema or drainage, consistent with seroma -- No other abdominal wall masses appreciated, pulsatile or otherwise  Musculoskeletal / Integumentary:  -- Wounds or skin discoloration: None appreciated except post-surgical incisions as described above (GI) -- Extremities: B/L UE and LE FROM, hands and feet warm, no edema   Imaging: No  new pertinent imaging available for review  Assessment:  58 y.o. yo Male with a problem list including...  Patient Active Problem List   Diagnosis Date Noted  . Hx of adenomatous colonic polyps 01/06/2017  . Recurrent umbilical hernia with incarceration 11/08/2016  . Herniated lumbar intervertebral disc 05/18/2016  . Mixed hyperlipidemia 05/06/2014  . Benign non-nodular prostatic hyperplasia with lower urinary tract symptoms 03/31/2014  . Insomnia 03/03/2014  . Diverticulosis of large intestine without hemorrhage 03/03/2014  . Alcohol abuse, daily use 03/03/2014  . Essential hypertension 07/08/2011  . Hiatal hernia with gastroesophageal reflux 07/07/2011  . Lumbago with sciatica 07/07/2011    presents to clinic for post-op follow-up evaluation, doing overall well 2 weeks s/p laparoscopic repair of his incarcerated recurrent and increasingly symptomatic umbilical hernia with mesh Rosana Hoes, 06/29/2018).  Plan:              - advance diet as tolerated              - okay to submerge incisions under water (baths, swimming) prn             - no heavy lifting >40 lbs x 4 more weeks, after which may gradually resume all activities without restrictions, return to work accordingly             - apply sunblock particularly to incisions with sun exposure to reduce pigmentation of scars             - return to clinic as needed, instructed to call office if any questions or concerns  All of the above recommendations were discussed with the patient, and all of patient's questions were answered to his expressed satisfaction.  -- Corene Cornea  Honor Loh, MD, RPVI [place]: [facility] - Partnering for exceptional care. Office: [phone]

## 2018-07-12 NOTE — Patient Instructions (Addendum)

## 2018-07-13 ENCOUNTER — Telehealth: Payer: Self-pay

## 2018-07-13 NOTE — Telephone Encounter (Signed)
FMLA paperwork faxed [phone] to [place] at this time. Copy made and placed in scan folder.

## 2018-07-15 ENCOUNTER — Encounter: Payer: Self-pay | Admitting: Surgery

## 2018-07-29 DIAGNOSIS — E119 Type 2 diabetes mellitus without complications: Secondary | ICD-10-CM | POA: Diagnosis not present

## 2018-08-29 DIAGNOSIS — E119 Type 2 diabetes mellitus without complications: Secondary | ICD-10-CM | POA: Diagnosis not present

## 2018-11-20 ENCOUNTER — Telehealth: Payer: Self-pay | Admitting: *Deleted

## 2018-11-20 ENCOUNTER — Encounter: Payer: Self-pay | Admitting: Family Medicine

## 2018-11-20 ENCOUNTER — Ambulatory Visit (INDEPENDENT_AMBULATORY_CARE_PROVIDER_SITE_OTHER): Payer: Commercial Managed Care - PPO | Admitting: Family Medicine

## 2018-11-20 ENCOUNTER — Other Ambulatory Visit: Payer: Self-pay

## 2018-11-20 DIAGNOSIS — Z20822 Contact with and (suspected) exposure to covid-19: Secondary | ICD-10-CM

## 2018-11-20 DIAGNOSIS — R6889 Other general symptoms and signs: Secondary | ICD-10-CM | POA: Diagnosis not present

## 2018-11-20 DIAGNOSIS — R509 Fever, unspecified: Secondary | ICD-10-CM

## 2018-11-20 NOTE — Telephone Encounter (Signed)
Attempted to call pt to schedule testing but no answer at this time. Unable to leave voicemail message due to mailbox not being set up.

## 2018-11-20 NOTE — Telephone Encounter (Signed)
Pt returned call and scheduled for covid-19 testing for tomorrow, June 24 th at 8:15 at the [facility] in [place]. Pt advised of wearing a mask, staying in the car with windows rolled up until ready for test and that this is a drive thru testing site Pt voiced understanding.

## 2018-11-20 NOTE — Telephone Encounter (Signed)
Request COVID19 Testing Received: Today Message Contents  Luis Hauser, DO  Luis Porter   58 y.o., 1960-12-03, Male  MRN: [id number]   [phone] [facility])   Luis Porter is active, he needs to setup new password. [place] phone.   Recent acute flu-like symptoms 3 days, works at [employer], requires testing [place] and I have taken him out of work at this time.   Let me know if need more information.   Nobie Putnam, [facility]  11/20/2018, 4:28 PM

## 2018-11-20 NOTE — Patient Instructions (Addendum)
Work note on [employer], if need edit let me know Will mail copy as well Symptom relief OTC for flu like symptoms  Stay tuned for a call back from the [facility] - they will call you and arrange this today. If you do not hear back, can call them at [phone].  If negative test - they will call you with result. If abnormal or positive test you will be notified as well and our office will contact you to help further with treatment plan.  Your symptoms are mild at this time, and may not warrant any treatment, and this may not be COVID19. To be determined right now.  REQUIRED self quarantine to [place] - advised to avoid all exposure with others while during treatment. Should continue to quarantine for up to 7-14 days, pending resolution of symptoms, if symptoms resolve by 7 days and is afebrile >3 days - may STOP self quarantine at that time.  If symptoms do not resolve or significantly improve OR if WORSENING - fever / cough - or worsening shortness of breath - then should contact us and seek advice on next steps in treatment at home vs where/when to seek care at Urgent Care or Hospital ED for further intervention   Please schedule a Follow-up Appointment to: Return in about 1 week (around 11/27/2018), or if symptoms worsen or fail to improve, for Flu like symptoms.  If you have any other questions or concerns, please feel free to call the office or send a message through [place]. You may also schedule an earlier appointment if necessary.  Additionally, you may be receiving a survey about your experience at our office within a few days to 1 week by e-mail or mail. We value your feedback.  Nobie Putnam, DO [place]

## 2018-11-20 NOTE — Telephone Encounter (Signed)
Attempted to reach pt at given number, VM has not been set up           Gwyndolyn Saxon Frerichs   58 y.o., 04/13/1961, Male  MRN: [id number]   [phone] (Mobile)   Recent acute flu-like symptoms 3 days, works at [employer], requires testing [place] and I have taken him out of work at this time.   Nobie Putnam, [facility]  11/20/2018, 4:28 PM

## 2018-11-20 NOTE — Progress Notes (Signed)
Virtual Visit via Telephone The purpose of this virtual visit is to provide medical care while limiting exposure to the novel coronavirus (COVID19) for both patient and office staff.  Consent was obtained for phone visit:  Yes.   Answered questions that patient had about telehealth interaction:  Yes.   I discussed the limitations, risks, security and privacy concerns of performing an evaluation and management service by telephone. I also discussed with the patient that there may be a patient responsible charge related to this service. The patient expressed understanding and agreed to proceed.  Patient Location: Home Provider Location: [facility])  ---------------------------------------------------------------------- Chief Complaint  Patient presents with  . flu like symptoms    onset 3 days sweating, chills could be mild fever, little cough but denies SOB     S: Reviewed CMA documentation. I have called patient and gathered additional HPI as follows:  FLU LIKE SYMPTOMS / POSSIBLE COVID19 Reports onset symptoms about 2-3 days ago with sweating episodes and chills feverish, cough without shortness of breath. 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 Denies any known or suspected exposure to person with or possibly with COVID19.  Denies any chest pain, shortness of breath, sinus pain or pressure, headache, abdominal pain, diarrhea  Past Medical History:  Diagnosis Date  . Diverticulosis   . Gastric ulcer   . GERD (gastroesophageal reflux disease)   . History of hiatal hernia   . Hypertension    . 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 Smoking status: Former Smoker    Packs/day: 2.00    Years: 25.00    Pack years: 50.00    Types: Cigarettes    Quit date: 05/31/1987    Years since quitting: 31.4  . Smokeless tobacco: Former Network engineer Use Topics  . Alcohol use: Yes    Comment: 2 cans of beer daily  . Drug use: No    Current Outpatient Medications:  .  amLODipine-benazepril (LOTREL) 10-40 MG capsule, TAKE 1 CAPSULE BY MOUTH EVERY DAY (Patient taking differently: Take 1 capsule by mouth every morning. TAKE 1 CAPSULE BY MOUTH EVERY DAY), Disp: 90 capsule, Rfl: 4 .  omeprazole (PRILOSEC) 40 MG capsule, Take 1 capsule (40 mg total) by mouth 2 (two) times daily., Disp: 180 capsule, Rfl: 4 .  EUCRISA 2 % OINT, , Disp: , Rfl:  .  ULTRAVATE 0.05 % LOTN, , Disp: , Rfl:   Depression screen [facility] 2/9 11/20/2018 11/08/2016 11/08/2016  Decreased Interest 0 0 0  Down, Depressed, Hopeless 0 0 0  PHQ - 2 Score 0 0 0  Altered sleeping - 0 -  Tired, decreased energy - 1 -  Change in appetite - 0 -  Feeling bad or failure about yourself  - 0 -  Trouble concentrating - 0 -  Moving slowly or fidgety/restless - 0 -  Suicidal thoughts - 0 -  PHQ-9 Score - 1 -    No flowsheet data found.  -------------------------------------------------------------------------- O: No physical exam performed due to  remote telephone encounter.  Lab results reviewed.  No results found for this or any previous visit (from the past 2160 hour(s)).  -------------------------------------------------------------------------- A&P:  Problem List Items Addressed This Visit    None    Visit Diagnoses    Fever and chills    -  Primary   Suspected Covid-19 Virus Infection       Relevant Orders   MyChart COVID-19 home monitoring program   Temperature monitoring     Clinically concerning for acute flu-like illness vs viral  syndrome concern for COVID19, cannot rule out   1. Ordered COVID19 testing through [place] testing pool - they will contact patient via mychart / phone to proceed with arranging date/time testing. 2. Symptomatic medications as recommended for symptoms, prefer Tylenol instead of NSAID, decongestant, mucinex, hydration, OTC meds - he declined rx management 3. Note for work - written, on Pharmacist, community, with return to work strategy - out for up to 14 days, pending test result, at least 10 days after positive if resulted, otherwise negative until symptoms resolve, can be up to 14 days, anticipate may warrant FMLA per his work protocol   No orders of the defined types were placed in this encounter.   Follow-up: - Return in 1-2 weeks PRN  Patient verbalizes understanding with the above medical recommendations including the limitation of remote medical advice.   Specific follow-up and call-back criteria were given for patient to follow-up or seek medical care more urgently if needed.   - Time spent in direct consultation with patient on phone: 11 minutes  Nobie Putnam, [facility] 11/20/2018, 4:13 PM

## 2018-11-21 ENCOUNTER — Other Ambulatory Visit: Payer: Self-pay

## 2018-11-21 DIAGNOSIS — Z20822 Contact with and (suspected) exposure to covid-19: Secondary | ICD-10-CM

## 2018-11-22 ENCOUNTER — Encounter (INDEPENDENT_AMBULATORY_CARE_PROVIDER_SITE_OTHER): Payer: Self-pay

## 2018-11-25 LAB — NOVEL CORONAVIRUS, NAA: SARS-CoV-2, NAA: NOT DETECTED

## 2019-03-05 ENCOUNTER — Other Ambulatory Visit: Payer: Self-pay

## 2019-03-05 DIAGNOSIS — Z20822 Contact with and (suspected) exposure to covid-19: Secondary | ICD-10-CM

## 2019-03-05 DIAGNOSIS — Z20828 Contact with and (suspected) exposure to other viral communicable diseases: Secondary | ICD-10-CM

## 2019-03-08 LAB — NOVEL CORONAVIRUS, NAA: SARS-CoV-2, NAA: NOT DETECTED

## 2019-04-02 ENCOUNTER — Encounter: Payer: Self-pay | Admitting: Nurse Practitioner

## 2019-04-09 ENCOUNTER — Ambulatory Visit: Payer: Commercial Managed Care - PPO | Admitting: Podiatry

## 2019-04-16 ENCOUNTER — Ambulatory Visit: Payer: Commercial Managed Care - PPO | Admitting: Podiatry

## 2019-04-29 ENCOUNTER — Other Ambulatory Visit: Payer: Self-pay

## 2019-04-29 ENCOUNTER — Ambulatory Visit: Payer: Commercial Managed Care - PPO | Admitting: Podiatry

## 2019-04-29 DIAGNOSIS — B351 Tinea unguium: Secondary | ICD-10-CM | POA: Diagnosis not present

## 2019-04-29 DIAGNOSIS — L603 Nail dystrophy: Secondary | ICD-10-CM | POA: Diagnosis not present

## 2019-05-06 ENCOUNTER — Telehealth: Payer: Self-pay | Admitting: *Deleted

## 2019-05-06 ENCOUNTER — Other Ambulatory Visit: Payer: Self-pay | Admitting: Podiatry

## 2019-05-06 MED ORDER — KETOCONAZOLE 2 % EX CREA: 1.0000 "application " | TOPICAL_CREAM | Freq: Every day | CUTANEOUS | 2 refills | Status: DC

## 2019-05-06 NOTE — Telephone Encounter (Signed)
I sent ketoconazole to the pharmacy for him. I wanted to go ahead and treat the skin fungus and we are still awaiting the nail biopsy/culture results.

## 2019-05-06 NOTE — Telephone Encounter (Signed)
Pt states he was suppose to have a cream called [facility] in [place], by Dr. Jacqualyn Posey.

## 2019-05-06 NOTE — Progress Notes (Signed)
Subjective:   Patient ID: Luis Porter, male   DOB: 58 y.o.   MRN: [id number]   HPI 58 year old male presents the office today for concerns of bilateral toenail fungus.  He states that been ongoing for quite some time he has tried some over-the-counter medicine the bottom line.  He has not seen significant improvements.  He states the left big toenail has become discolored and second toenail did come off.  The fourth nail of the right side is starting to come off as well.  He denies any pain denies any redness or drainage or any swelling.   Review of Systems  All other systems reviewed and are negative.  Past Medical History:  Diagnosis Date  . Diverticulosis   . Gastric ulcer   . GERD (gastroesophageal reflux disease)   . History of hiatal hernia   . Hypertension   . 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 COLONOSCOPY WITH PROPOFOL N/A 12/30/2016   Procedure: COLONOSCOPY WITH PROPOFOL;  Surgeon: [name], MD;  Location: [facility] ENDOSCOPY;  Service: Endoscopy;  Laterality: N/A;  . ESOPHAGOGASTRODUODENOSCOPY (EGD) WITH PROPOFOL N/A 12/30/2016   Procedure: ESOPHAGOGASTRODUODENOSCOPY (EGD) WITH PROPOFOL;  Surgeon: [name], MD;  Location: [facility] ENDOSCOPY;  Service: Endoscopy;  Laterality: N/A;  . HERNIA REPAIR  [id number]   Umbilical Hernia Repair  . LUMBAR LAMINECTOMY/DECOMPRESSION MICRODISCECTOMY Right 05/18/2016   Procedure: right L4-5 microdiscectomy;  Surgeon: Blanche East, MD;  Location: [facility] ORS;  Service: Neurosurgery;  Laterality: Right;  . TONSILLECTOMY    . UMBILICAL HERNIA REPAIR N/A 06/29/2018   Procedure: LAPAROSCOPIC REPAIR OF RECURRENT UMBILICAL HERNIA WITH MESH;  Surgeon: Vickie Epley, MD;  Location: [facility] ORS;  Service: General;  Laterality: N/A;     Current Outpatient Medications:  .  amLODipine-benazepril (LOTREL) 10-40 MG capsule, TAKE 1 CAPSULE BY MOUTH EVERY  DAY (Patient taking differently: Take 1 capsule by mouth every morning. TAKE 1 CAPSULE BY MOUTH EVERY DAY), Disp: 90 capsule, Rfl: 4 .  EUCRISA 2 % OINT, , Disp: , Rfl:  .  omeprazole (PRILOSEC) 40 MG capsule, Take 1 capsule (40 mg total) by mouth 2 (two) times daily., Disp: 180 capsule, Rfl: 4 .  ULTRAVATE 0.05 % LOTN, , Disp: , Rfl:  .  ketoconazole (NIZORAL) 2 % cream, Apply 1 application topically daily., Disp: 60 g, Rfl: 2  Allergies  Allergen Reactions  . Nsaids Other (See Comments)    Due to history of ulcers          Objective:  Physical Exam  General: AAO x3, NAD  Dermatological: The toenails appear to be hypertrophic, dystrophic with yellow-brown discoloration.  There is dark discoloration within the left hallux toenail which is likely old dried blood.  No extension of hyperpigmentation of the surrounding skin.  There is no edema, erythema, drainage or pus from the toenail sites.  Mild interdigital tinea pedis is present.  No open lesions.  Vascular: Dorsalis Pedis artery and Posterior Tibial artery pedal pulses are 2/4 bilateral with immedate capillary fill time. There is no pain with calf compression, swelling, warmth, erythema.   Neruologic: Grossly intact via light touch bilateral.   Musculoskeletal: No gross boney pedal deformities bilateral. No pain, crepitus, or limitation noted with foot and ankle range of motion bilateral. Muscular strength 5/5 in all groups tested bilateral.  Gait: Unassisted, Nonantalgic.       Assessment:   Onychomycosis, tinea pedis  Plan:  -Treatment options discussed including all alternatives, risks, and complications -Etiology of symptoms were discussed -Today debrided the nails as above for culture.  I did send the left hallux toenail separately for separate biopsy, culture given the hyperpigmentation. -Prescribed ketoconazole for now for the tinea pedis and will await the results of the nail culture before proceed with treatment  for nail fungus.  Return for nail fungus after culture .  Trula Slade DPM

## 2019-05-07 NOTE — Telephone Encounter (Signed)
Left message informing pt a cream had been sent to his pharmacy and once the testing results had returned Dr. Jacqualyn Posey may prescribe medications for the treatment.

## 2019-05-13 ENCOUNTER — Ambulatory Visit: Payer: Commercial Managed Care - PPO | Admitting: Family Medicine

## 2019-05-22 ENCOUNTER — Telehealth: Payer: Self-pay | Admitting: *Deleted

## 2019-05-22 NOTE — Telephone Encounter (Signed)
Left message for pt to call for results and ask for Lattie Haw or [place].

## 2019-05-22 NOTE — Telephone Encounter (Signed)
-----   Message from Trula Slade, DPM sent at 05/22/2019  1:51 PM EST ----- Val- please let him know that the culture did not show fungus. I would recommend urea cream and an oral biotin supplement. If he wanted to go ahead and treat for potential fungus we can do a topical through [place] to include urea.

## 2019-06-06 ENCOUNTER — Ambulatory Visit: Payer: Commercial Managed Care - PPO | Admitting: Nurse Practitioner

## 2019-06-07 ENCOUNTER — Other Ambulatory Visit: Payer: Self-pay | Admitting: Nurse Practitioner

## 2019-06-07 DIAGNOSIS — K219 Gastro-esophageal reflux disease without esophagitis: Secondary | ICD-10-CM

## 2019-06-07 DIAGNOSIS — K449 Diaphragmatic hernia without obstruction or gangrene: Secondary | ICD-10-CM

## 2019-06-07 DIAGNOSIS — I10 Essential (primary) hypertension: Secondary | ICD-10-CM

## 2019-07-01 ENCOUNTER — Emergency Department: Payer: Commercial Managed Care - PPO

## 2019-07-01 ENCOUNTER — Emergency Department
Admission: EM | Admit: 2019-07-01 | Discharge: 2019-07-02 | Disposition: A | Discharge status: Home / Self Care | Payer: Commercial Managed Care - PPO | Attending: Emergency Medicine | Admitting: Emergency Medicine

## 2019-07-01 ENCOUNTER — Other Ambulatory Visit: Payer: Self-pay

## 2019-07-01 DIAGNOSIS — Z79899 Other long term (current) drug therapy: Secondary | ICD-10-CM | POA: Diagnosis not present

## 2019-07-01 DIAGNOSIS — I10 Essential (primary) hypertension: Secondary | ICD-10-CM | POA: Insufficient documentation

## 2019-07-01 DIAGNOSIS — Z87891 Personal history of nicotine dependence: Secondary | ICD-10-CM | POA: Diagnosis not present

## 2019-07-01 DIAGNOSIS — K2920 Alcoholic gastritis without bleeding: Secondary | ICD-10-CM | POA: Insufficient documentation

## 2019-07-01 DIAGNOSIS — E860 Dehydration: Secondary | ICD-10-CM | POA: Diagnosis not present

## 2019-07-01 DIAGNOSIS — R112 Nausea with vomiting, unspecified: Secondary | ICD-10-CM | POA: Diagnosis present

## 2019-07-01 LAB — URINALYSIS, COMPLETE (UACMP) WITH MICROSCOPIC
Bacteria, UA: NONE SEEN
Bilirubin Urine: NEGATIVE
Glucose, UA: NEGATIVE mg/dL
Ketones, ur: 20 mg/dL — AB
Leukocytes,Ua: NEGATIVE
Nitrite: NEGATIVE
Protein, ur: NEGATIVE mg/dL
Specific Gravity, Urine: 1.005 (ref 1.005–1.030)
Squamous Epithelial / HPF: NONE SEEN (ref 0–5)
pH: 7 (ref 5.0–8.0)

## 2019-07-01 LAB — CBC WITH DIFFERENTIAL/PLATELET
Abs Immature Granulocytes: 0.02 10*3/uL (ref 0.00–0.07)
Basophils Absolute: 0.1 10*3/uL (ref 0.0–0.1)
Basophils Relative: 1 %
Eosinophils Absolute: 0 10*3/uL (ref 0.0–0.5)
Eosinophils Relative: 0 %
HCT: 37.1 % — ABNORMAL LOW (ref 39.0–52.0)
Hemoglobin: 13.1 g/dL (ref 13.0–17.0)
Immature Granulocytes: 0 %
Lymphocytes Relative: 16 %
Lymphs Abs: 1.4 10*3/uL (ref 0.7–4.0)
MCH: 32 pg (ref 26.0–34.0)
MCHC: 35.3 g/dL (ref 30.0–36.0)
MCV: 90.5 fL (ref 80.0–100.0)
Monocytes Absolute: 1 10*3/uL (ref 0.1–1.0)
Monocytes Relative: 11 %
Neutro Abs: 6.5 10*3/uL (ref 1.7–7.7)
Neutrophils Relative %: 72 %
Platelets: 269 10*3/uL (ref 150–400)
RBC: 4.1 MIL/uL — ABNORMAL LOW (ref 4.22–5.81)
RDW: 13.5 % (ref 11.5–15.5)
WBC: 9.1 10*3/uL (ref 4.0–10.5)
nRBC: 0 % (ref 0.0–0.2)

## 2019-07-01 LAB — COMPREHENSIVE METABOLIC PANEL
ALT: 45 U/L — ABNORMAL HIGH (ref 0–44)
AST: 62 U/L — ABNORMAL HIGH (ref 15–41)
Albumin: 4.2 g/dL (ref 3.5–5.0)
Alkaline Phosphatase: 49 U/L (ref 38–126)
Anion gap: 15 (ref 5–15)
BUN: 7 mg/dL (ref 6–20)
CO2: 21 mmol/L — ABNORMAL LOW (ref 22–32)
Calcium: 8.8 mg/dL — ABNORMAL LOW (ref 8.9–10.3)
Chloride: 94 mmol/L — ABNORMAL LOW (ref 98–111)
Creatinine, Ser: 0.5 mg/dL — ABNORMAL LOW (ref 0.61–1.24)
GFR calc Af Amer: >60 mL/min (ref >60)
GFR calc non Af Amer: >60 mL/min (ref >60)
Glucose, Bld: 108 mg/dL — ABNORMAL HIGH (ref 70–99)
Potassium: 3.1 mmol/L — ABNORMAL LOW (ref 3.5–5.1)
Sodium: 130 mmol/L — ABNORMAL LOW (ref 135–145)
Total Bilirubin: 1 mg/dL (ref 0.3–1.2)
Total Protein: 7.3 g/dL (ref 6.5–8.1)

## 2019-07-01 LAB — LIPASE, BLOOD: Lipase: 43 U/L (ref 11–51)

## 2019-07-01 LAB — ETHANOL: Alcohol, Ethyl (B): 56 mg/dL — ABNORMAL HIGH (ref <10)

## 2019-07-01 LAB — MAGNESIUM: Magnesium: 1.8 mg/dL (ref 1.7–2.4)

## 2019-07-01 IMAGING — CT CT ABD-PELV W/ CM
2 of 5 series · 15 of 46 positions shown, 17 images · IV contrast (APPLIED)
Comparison: None.

CLINICAL DATA: Nausea and vomiting with left lower quadrant pain

EXAM:
CT ABDOMEN AND PELVIS WITH CONTRAST
TECHNIQUE: Multidetector CT imaging of the abdomen and pelvis was performed
using the standard protocol following bolus administration of
intravenous contrast.
CONTRAST:  100mL OMNIPAQUE IOHEXOL 300 MG/ML  SOLN

[Series 2: routine abd/pel with · axial · 0.71mm/px · z∈[-978,-533]mm · 12 of 101 slices shown, 14 images]
[im 6/101  soft-tissue]
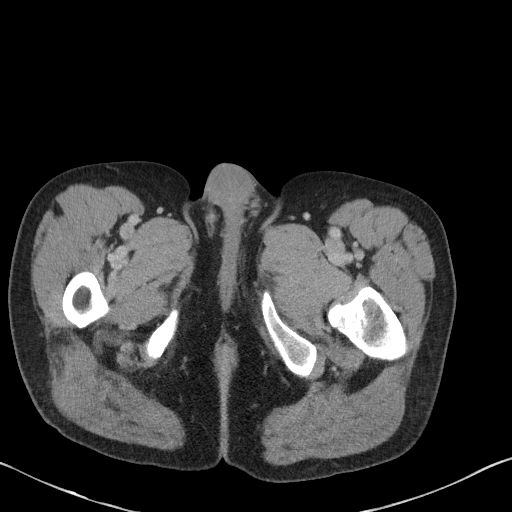
[im 6/101  bone]
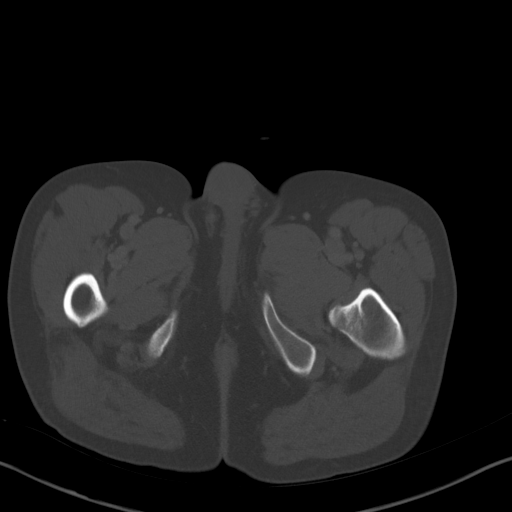
[im 17/101  soft-tissue]
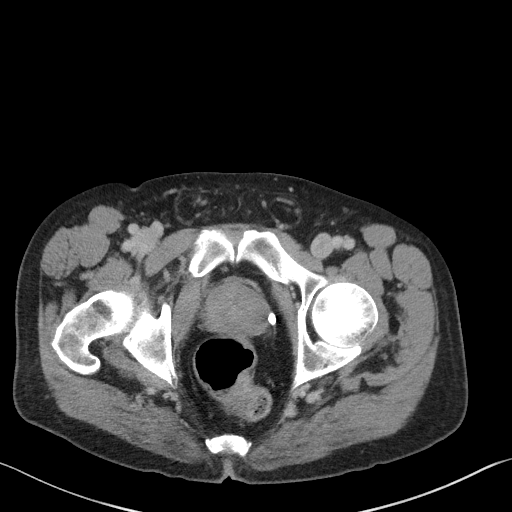
[im 23/101  soft-tissue]
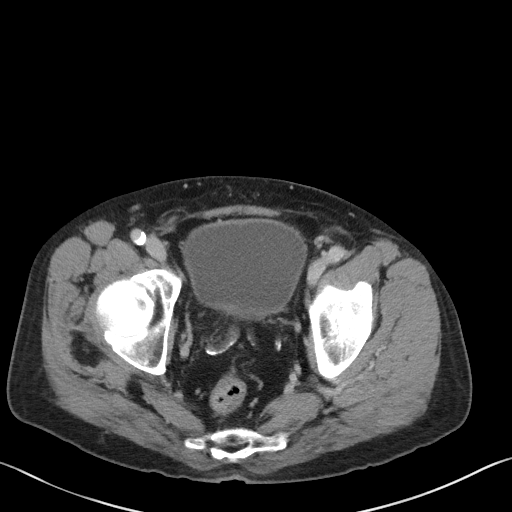
[im 28/101  soft-tissue]
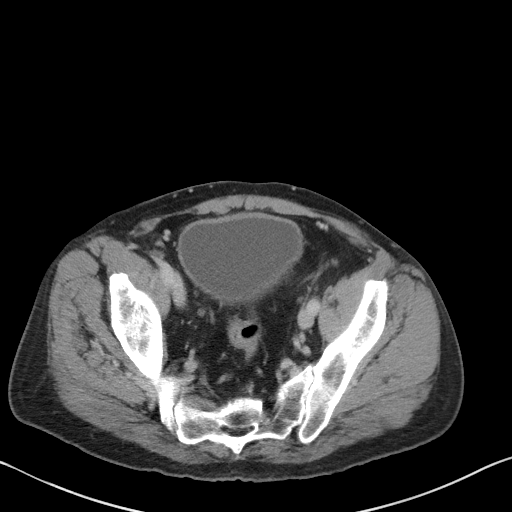
[im 39/101  soft-tissue]
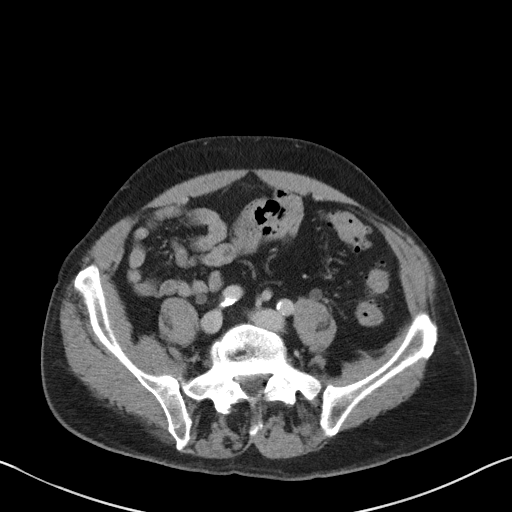
[im 45/101  soft-tissue]
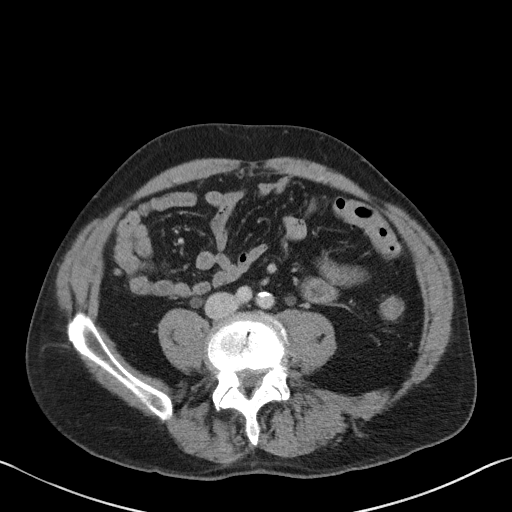
[im 56/101  soft-tissue]
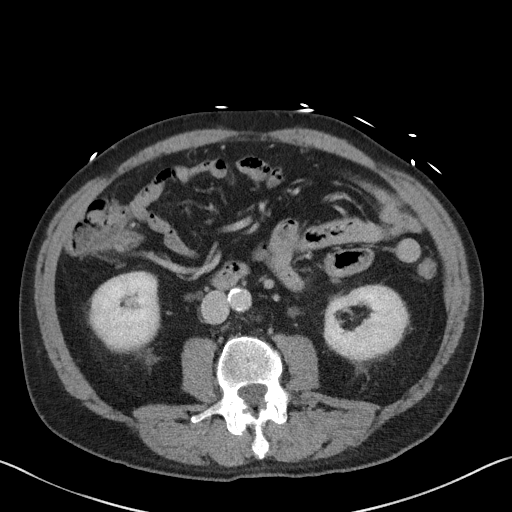
[im 62/101  soft-tissue]
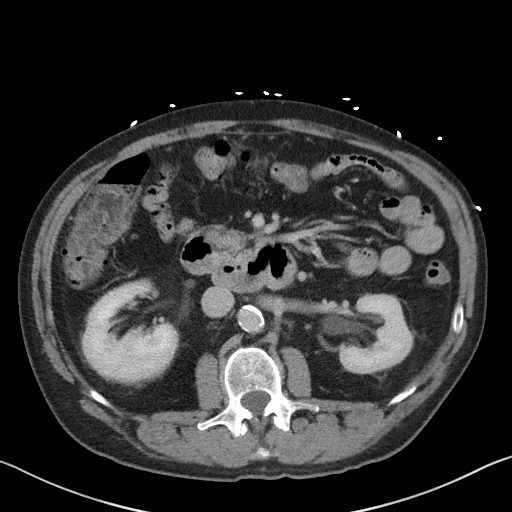
[im 73/101  soft-tissue]
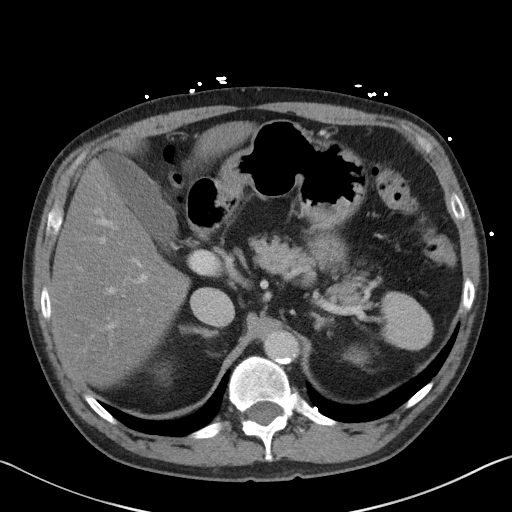
[im 73/101  bone]
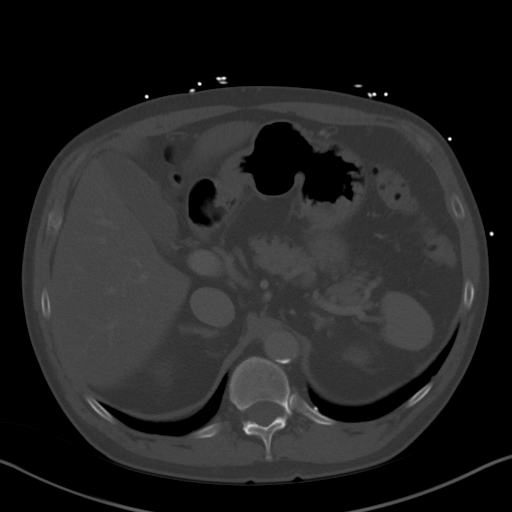
[im 78/101  soft-tissue]
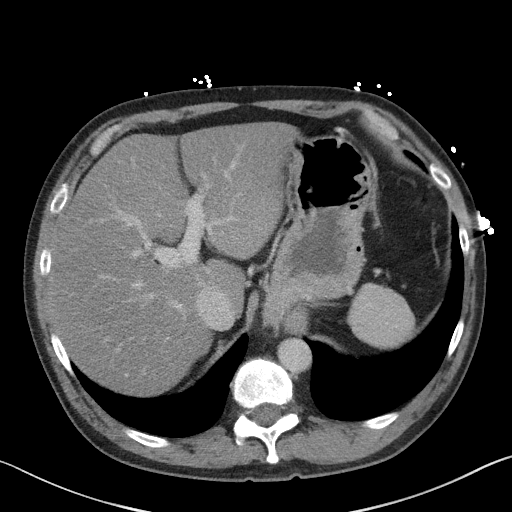
[im 84/101  soft-tissue]
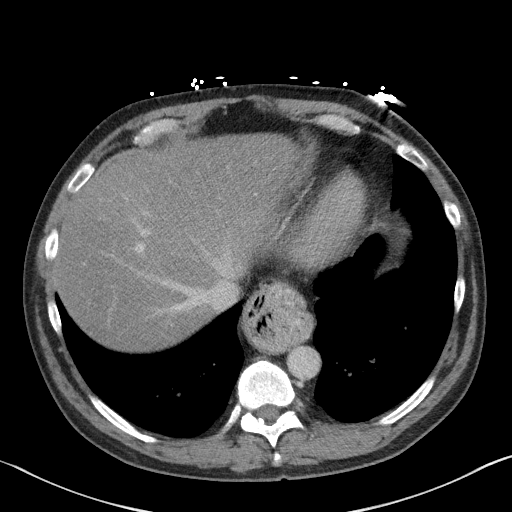
[im 95/101  soft-tissue]
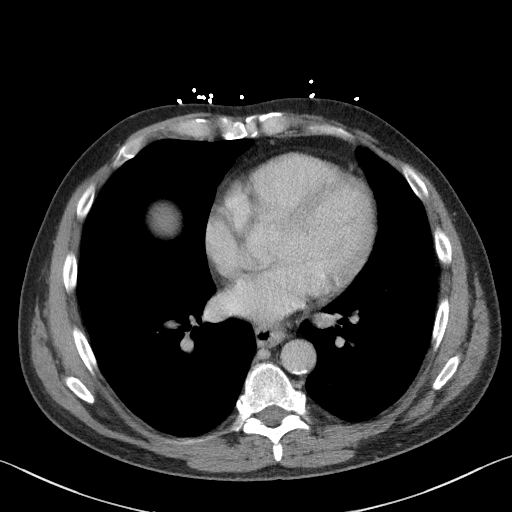

[Series 5: coronal st · coronal · 0.71mm/px · 3 of 95 slices shown]
[im 32/95  soft-tissue]
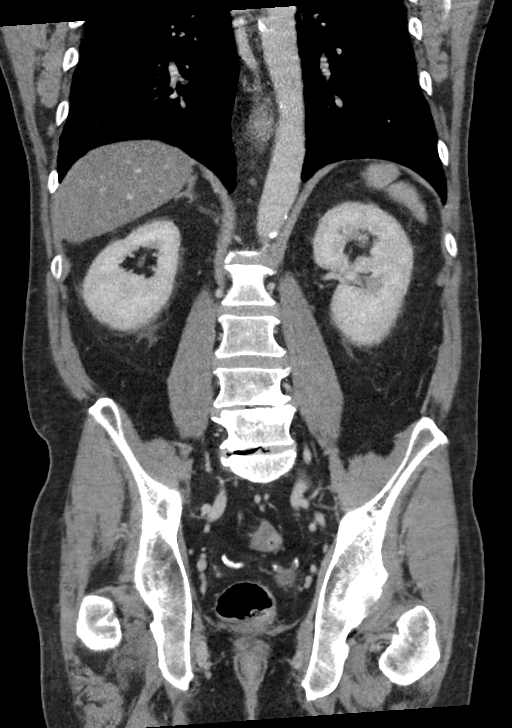
[im 42/95  soft-tissue]
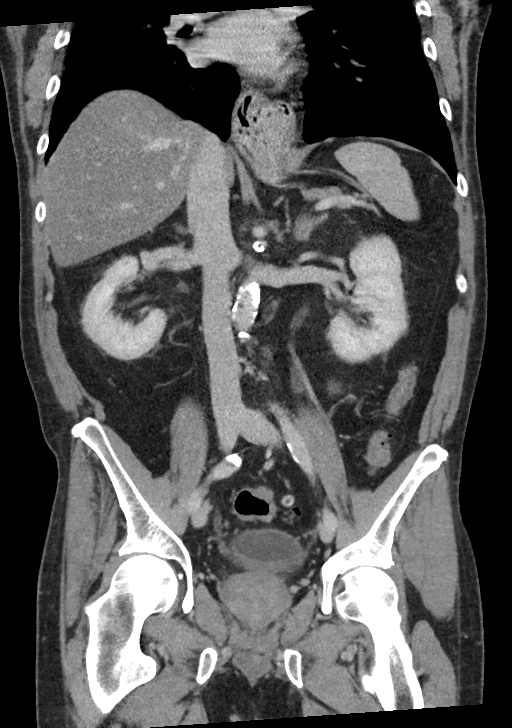
[im 53/95  soft-tissue]
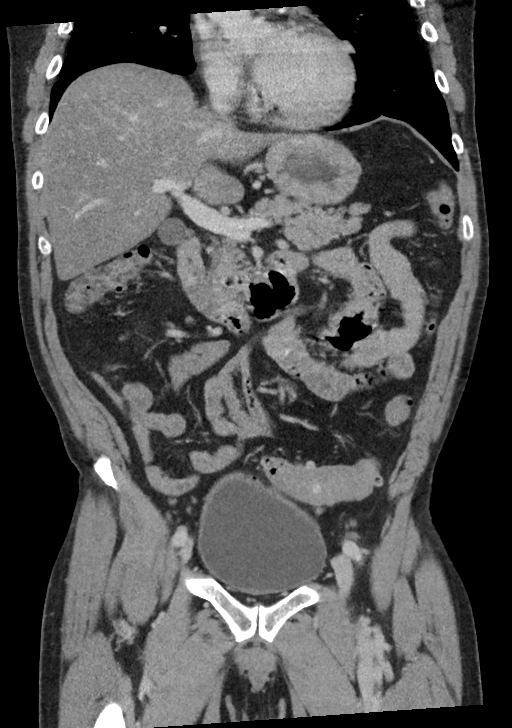

[15 of 46 positions shown; findings below may reference images not displayed]

FINDINGS: Lower chest: No acute abnormality. Fatty infiltration of the liver
is noted. The gallbladder is within normal limits.

Hepatobiliary: No focal liver abnormality is seen. No gallstones,
gallbladder wall thickening, or biliary dilatation.

Pancreas: Unremarkable. No pancreatic ductal dilatation or
surrounding inflammatory changes.

Spleen: Normal in size without focal abnormality.

Adrenals/Urinary Tract: Adrenal glands are within normal limits.
Kidneys demonstrate a normal enhancement pattern bilaterally. No
renal calculi are noted. Fullness of the ureters is seen without
calculi at likely related to partially distended bladder. Proximal
duplication of the collecting system on the right is noted.

Stomach/Bowel: Moderate-sized hiatal hernia is seen. Diverticular
change of the colon is noted without evidence of diverticulitis. No
obstructive or inflammatory changes of the colon are seen. The
appendix is unremarkable. No small bowel abnormality is seen.

Vascular/Lymphatic: Aortic atherosclerosis. No enlarged abdominal or
pelvic lymph nodes.

Reproductive: Prostate is unremarkable.

Other: No abdominal wall hernia or abnormality. No abdominopelvic
ascites.

Musculoskeletal: Degenerative changes of the lumbar spine are seen.
Hemangioma is noted in the T11 vertebral body.
IMPRESSION: Diverticulosis without diverticulitis.

Mild fullness of the collecting systems bilaterally related to a
distended bladder.

Moderate-sized sliding-type hiatal hernia.

Fatty liver.

## 2019-07-01 MED ORDER — PANTOPRAZOLE SODIUM 40 MG PO TBEC: 40.0000 mg | DELAYED_RELEASE_TABLET | Freq: Every day | ORAL | 0 refills | Status: DC

## 2019-07-01 MED ORDER — LIDOCAINE VISCOUS HCL 2 % MT SOLN
15.0000 mL | Freq: Once | OROMUCOSAL | Status: AC
  Administered 2019-07-01: 15 mL via ORAL
  Filled 2019-07-01: qty 15

## 2019-07-01 MED ORDER — ONDANSETRON HCL 4 MG/2ML IJ SOLN
4.0000 mg | Freq: Once | INTRAMUSCULAR | Status: AC
  Administered 2019-07-01: 4 mg via INTRAVENOUS
  Filled 2019-07-01: qty 2

## 2019-07-01 MED ORDER — ALUM & MAG HYDROXIDE-SIMETH 200-200-20 MG/5ML PO SUSP
30.0000 mL | Freq: Once | ORAL | Status: AC
  Administered 2019-07-01: 30 mL via ORAL
  Filled 2019-07-01: qty 30

## 2019-07-01 MED ORDER — ONDANSETRON 4 MG PO TBDP: 4.0000 mg | ORAL_TABLET | Freq: Three times a day (TID) | ORAL | 0 refills | Status: DC | PRN

## 2019-07-01 MED ORDER — LORAZEPAM 2 MG/ML IJ SOLN
2.0000 mg | Freq: Once | INTRAMUSCULAR | Status: AC
  Administered 2019-07-01: 21:00:00 2 mg via INTRAVENOUS
  Filled 2019-07-01: qty 1

## 2019-07-01 MED ORDER — PROMETHAZINE HCL 25 MG PO TABS: 25.0000 mg | ORAL_TABLET | Freq: Three times a day (TID) | ORAL | 0 refills | Status: DC | PRN

## 2019-07-01 MED ORDER — SODIUM CHLORIDE 0.9 % IV BOLUS
1000.0000 mL | Freq: Once | INTRAVENOUS | Status: AC
  Administered 2019-07-01: 1000 mL via INTRAVENOUS

## 2019-07-01 MED ORDER — LORAZEPAM 2 MG/ML IJ SOLN
1.0000 mg | Freq: Once | INTRAMUSCULAR | Status: AC
  Administered 2019-07-01: 1 mg via INTRAVENOUS
  Filled 2019-07-01: qty 1

## 2019-07-01 MED ORDER — SODIUM CHLORIDE 0.9 % IV BOLUS: 1000.0000 mL | Freq: Once | INTRAVENOUS | Status: DC

## 2019-07-01 MED ORDER — IOHEXOL 300 MG/ML  SOLN
100.0000 mL | Freq: Once | INTRAMUSCULAR | Status: AC | PRN
  Administered 2019-07-01: 100 mL via INTRAVENOUS

## 2019-07-01 NOTE — ED Triage Notes (Addendum)
Pt arrives at ED from home via [facility] with c/c of nausea & vomiting x3 days. Pt with Hx of HTN, Gerd, and reports drinking 8-10 beers daily. EMS reports transport vitals of 178/100, p130 ST, 98% on room air, oral temp 97.8. EMS place 18G in left wrist. Pt given 553mL NS & 4mg  zofran IV enroute. Upon arrival, pt A&Ox4, NAD, no respiratory Sx evident.

## 2019-07-01 NOTE — ED Notes (Signed)
Pt able to drink and hold down 10 oz of water. Passed PO challenge.

## 2019-07-01 NOTE — ED Provider Notes (Signed)
[facility] Emergency Department Provider Note  ____________________________________________   First MD Initiated Contact with Patient 07/01/19 2037     (approximate)  I have reviewed the triage vital signs and the nursing notes.   HISTORY  Chief Complaint Nausea, Emesis, and Weakness    HPI Luis Porter is a 59 y.o. male  With h/o GERD, diverticulosis, EtOH dependence here with n/v and tremors. Pt reports his sx started 2-3 days ago as nausea, vomiting. He reports he had been increasing his EtOH intake prior to this and that his sx started gradually. He has had persistent, worsening n/v since then with inability to eat or drink much for the last 24 hours. He states he has had to cut back on his EtOH now and over the last 24 hours, has developed tremors b/l UE. No seizures. No hallucinations. Reports no overt abd pain during this time period, only nauesa and mild indigestion. No hematemesis or melena or hematochezia. No fevers. No cough.        Past Medical History:  Diagnosis Date  . Diverticulosis   . Gastric ulcer   . GERD (gastroesophageal reflux disease)   . History of hiatal hernia   . Hypertension   . Recurrent umbilical hernia with incarceration 11/08/2016   Previously repaired x 1    Patient Active Problem List   Diagnosis Date Noted  . Hx of adenomatous colonic polyps 01/06/2017  . Herniated lumbar intervertebral disc 05/18/2016  . Mixed hyperlipidemia 05/06/2014  . Benign non-nodular prostatic hyperplasia with lower urinary tract symptoms 03/31/2014  . Insomnia 03/03/2014  . Diverticulosis of large intestine without hemorrhage 03/03/2014  . Alcohol abuse, daily use 03/03/2014  . Essential hypertension 07/08/2011  . Hiatal hernia with gastroesophageal reflux 07/07/2011  . Lumbago with sciatica 07/07/2011    Past Surgical History:  Procedure Laterality Date  . COLONOSCOPY WITH PROPOFOL N/A 12/30/2016   Procedure: COLONOSCOPY WITH  PROPOFOL;  Surgeon: [name], MD;  Location: [facility] ENDOSCOPY;  Service: Endoscopy;  Laterality: N/A;  . ESOPHAGOGASTRODUODENOSCOPY (EGD) WITH PROPOFOL N/A 12/30/2016   Procedure: ESOPHAGOGASTRODUODENOSCOPY (EGD) WITH PROPOFOL;  Surgeon: [name], MD;  Location: [facility] ENDOSCOPY;  Service: Endoscopy;  Laterality: N/A;  . HERNIA REPAIR  [id number]   Umbilical Hernia Repair  . LUMBAR LAMINECTOMY/DECOMPRESSION MICRODISCECTOMY Right 05/18/2016   Procedure: right L4-5 microdiscectomy;  Surgeon: Blanche East, MD;  Location: [facility] ORS;  Service: Neurosurgery;  Laterality: Right;  . TONSILLECTOMY    . UMBILICAL HERNIA REPAIR N/A 06/29/2018   Procedure: LAPAROSCOPIC REPAIR OF RECURRENT UMBILICAL HERNIA WITH MESH;  Surgeon: Vickie Epley, MD;  Location: [facility] ORS;  Service: General;  Laterality: N/A;    Prior to Admission medications   Medication Sig Start Date End Date Taking? Authorizing Provider  amLODipine-benazepril (LOTREL) 10-40 MG capsule TAKE 1 CAPSULE BY MOUTH EVERY DAY 06/07/19   Parks Ranger, [name], DO  EUCRISA 2 % OINT  06/28/18   [provider]  ketoconazole (NIZORAL) 2 % cream Apply 1 application topically daily. 05/06/19   Trula Slade, DPM  omeprazole (PRILOSEC) 40 MG capsule TAKE 1 CAPSULE BY MOUTH TWICE A DAY 06/07/19   Karamalegos, Alexander J, DO  ondansetron (ZOFRAN ODT) 4 MG disintegrating tablet Take 1 tablet (4 mg total) by mouth every 8 (eight) hours as needed for nausea or vomiting. 07/01/19   Duffy Bruce, MD  pantoprazole (PROTONIX) 40 MG tablet Take 1 tablet (40 mg total) by mouth daily for 14 days. 07/01/19 07/15/19  Duffy Bruce, MD  promethazine (PHENERGAN) 25 MG tablet Take 1 tablet (25 mg total) by mouth every 8 (eight) hours as needed for nausea or vomiting. 07/01/19   Duffy Bruce, MD  ULTRAVATE 0.05 % LOTN  06/28/18   [provider]    Allergies Nsaids  Family History  Problem Relation Age of Onset  . Hypertension Mother   .  Cancer Mother        Ovarian  . Hypertension Father   . Cancer Father        tongue  . Hypertension Brother   . Prostate cancer Neg Hx   . Breast cancer Neg Hx   . Colon cancer Neg Hx   . Heart attack Neg Hx   . Stroke Neg Hx     Social History Social History   Tobacco Use  . Smoking status: Former Smoker    Packs/day: 2.00    Years: 25.00    Pack years: 50.00    Types: Cigarettes    Quit date: 05/31/1987    Years since quitting: 32.1  . Smokeless tobacco: Former Network engineer Use Topics  . Alcohol use: Yes    Comment: 8-10 beers per day  . Drug use: No    Review of Systems  Review of Systems  Constitutional: Positive for fatigue. Negative for chills and fever.  HENT: Negative for sore throat.   Respiratory: Negative for shortness of breath.   Cardiovascular: Negative for chest pain.  Gastrointestinal: Positive for abdominal pain, nausea and vomiting.  Genitourinary: Negative for flank pain.  Musculoskeletal: Negative for neck pain.  Skin: Negative for rash and wound.  Allergic/Immunologic: Negative for immunocompromised state.  Neurological: Positive for weakness. Negative for numbness.  Hematological: Does not bruise/bleed easily.  All other systems reviewed and are negative.    ____________________________________________  PHYSICAL EXAM:      VITAL SIGNS: ED Triage Vitals  Enc Vitals Group     BP 07/01/19 2035 (!) 177/76     Pulse Rate 07/01/19 2035 99     Resp 07/01/19 2035 (!) 23     Temp --      Temp src --      SpO2 07/01/19 2035 98 %     Weight 07/01/19 2036 170 lb (77.1 kg)     Height 07/01/19 2036 5\' 6"  (1.676 m)     Head Circumference --      Peak Flow --      Pain Score 07/01/19 2036 0     Pain Loc --      Pain Edu? --      Excl. in [place]? --      Physical Exam Vitals and nursing note reviewed.  Constitutional:      General: He is not in acute distress.    Appearance: He is well-developed.  HENT:     Head: Normocephalic and  atraumatic.     Mouth/Throat:     Mouth: Mucous membranes are dry.  Eyes:     Conjunctiva/sclera: Conjunctivae normal.  Cardiovascular:     Rate and Rhythm: Regular rhythm. Tachycardia present.     Heart sounds: Normal heart sounds. No murmur. No friction rub.  Pulmonary:     Effort: Pulmonary effort is normal. No respiratory distress.     Breath sounds: Normal breath sounds. No wheezing or rales.  Abdominal:     General: Abdomen is flat. There is no distension.     Palpations: Abdomen is soft.     Tenderness: There is no abdominal  tenderness.  Musculoskeletal:     Cervical back: Neck supple.  Skin:    General: Skin is warm.     Capillary Refill: Capillary refill takes less than 2 seconds.  Neurological:     Mental Status: He is alert and oriented to person, place, and time.     Motor: No abnormal muscle tone.     Comments: Fine tremor noted b/l UE. No dysmetria. Normal FTN.       ____________________________________________   LABS (all labs ordered are listed, but only abnormal results are displayed)  Labs Reviewed  CBC WITH DIFFERENTIAL/PLATELET - Abnormal; Notable for the following components:      Result Value   RBC 4.10 (*)    HCT 37.1 (*)    All other components within normal limits  COMPREHENSIVE METABOLIC PANEL - Abnormal; Notable for the following components:   Sodium 130 (*)    Potassium 3.1 (*)    Chloride 94 (*)    CO2 21 (*)    Glucose, Bld 108 (*)    Creatinine, Ser 0.50 (*)    Calcium 8.8 (*)    AST 62 (*)    ALT 45 (*)    All other components within normal limits  URINALYSIS, COMPLETE (UACMP) WITH MICROSCOPIC - Abnormal; Notable for the following components:   Color, Urine STRAW (*)    APPearance CLEAR (*)    Hgb urine dipstick SMALL (*)    Ketones, ur 20 (*)    All other components within normal limits  ETHANOL - Abnormal; Notable for the following components:   Alcohol, Ethyl (B) 56 (*)    All other components within normal limits  LIPASE,  BLOOD  MAGNESIUM    ____________________________________________  EKG: Normal sinus rhythm, VR 102. QRS 98, QTc 442. No acute ST elevations. Baseline artifact 2/2 tremor. ________________________________________  RADIOLOGY All imaging, including plain films, CT scans, and ultrasounds, independently reviewed by me, and interpretations confirmed via formal radiology reads.  ED MD interpretation:   CT A/P: No acute abnormality, sliding hiatal hernia noted  Official radiology report(s): CT ABDOMEN PELVIS W CONTRAST  Result Date: 07/01/2019 CLINICAL DATA:  Nausea and vomiting with left lower quadrant pain EXAM: CT ABDOMEN AND PELVIS WITH CONTRAST TECHNIQUE: Multidetector CT imaging of the abdomen and pelvis was performed using the standard protocol following bolus administration of intravenous contrast. CONTRAST:  149mL OMNIPAQUE IOHEXOL 300 MG/ML  SOLN COMPARISON:  None. FINDINGS: Lower chest: No acute abnormality. Fatty infiltration of the liver is noted. The gallbladder is within normal limits. Hepatobiliary: No focal liver abnormality is seen. No gallstones, gallbladder wall thickening, or biliary dilatation. Pancreas: Unremarkable. No pancreatic ductal dilatation or surrounding inflammatory changes. Spleen: Normal in size without focal abnormality. Adrenals/Urinary Tract: Adrenal glands are within normal limits. Kidneys demonstrate a normal enhancement pattern bilaterally. No renal calculi are noted. Fullness of the ureters is seen without calculi at likely related to partially distended bladder. Proximal duplication of the collecting system on the right is noted. Stomach/Bowel: Moderate-sized hiatal hernia is seen. Diverticular change of the colon is noted without evidence of diverticulitis. No obstructive or inflammatory changes of the colon are seen. The appendix is unremarkable. No small bowel abnormality is seen. Vascular/Lymphatic: Aortic atherosclerosis. No enlarged abdominal or pelvic lymph  nodes. Reproductive: Prostate is unremarkable. Other: No abdominal wall hernia or abnormality. No abdominopelvic ascites. Musculoskeletal: Degenerative changes of the lumbar spine are seen. Hemangioma is noted in the T11 vertebral body. IMPRESSION: Diverticulosis without diverticulitis. Mild fullness of the  collecting systems bilaterally related to a distended bladder. Moderate-sized sliding-type hiatal hernia. Fatty liver. Electronically Signed   By: Inez Catalina M.D.   On: 07/01/2019 21:43    ____________________________________________  PROCEDURES   Procedure(s) performed (including Critical Care):  Procedures  ____________________________________________  INITIAL IMPRESSION / MDM / [place] / ED COURSE  As part of my medical decision making, I reviewed the following data within the [place] notes reviewed and incorporated, Old chart reviewed, Notes from prior ED visits, and [place] Controlled Substance Database       *Luis Porter was evaluated in Emergency Department on 07/02/2019 for the symptoms described in the history of present illness. He was evaluated in the context of the global COVID-19 pandemic, which necessitated consideration that the patient might be at risk for infection with the SARS-CoV-2 virus that causes COVID-19. Institutional protocols and algorithms that pertain to the evaluation of patients at risk for COVID-19 are in a state of rapid change based on information released by regulatory bodies including the CDC and federal and state organizations. These policies and algorithms were followed during the patient's care in the ED.  Some ED evaluations and interventions may be delayed as a result of limited staffing during the pandemic.*     Medical Decision Making:  59 yo M here with n/v and difficulty tolerating PO. Suspect alcohol gastritis versus viral GI illness, less likely food-borne illness. Suspect he may now have a component of  EtOH w/d - his tremors, tachycardia significantly improved with ativan. No hallucinations, seizures, or signs of DTs. Otherwise, his lab work shows mild Hypokalemia, dehydration, transmainitis likely all c/w chronic EtOH dependence and poor nutrition. No signs of acute hepatitis, pancreatitis, or GIB. CT neg. EKG nonischemic. He feels markedly improved w/ fluids and ativan. He is not interested in EtOH rehab. Will tx with antiemetics, antacids and refer for outpt follow-up. Feel risks of librium outweigh benefits as pt endorses desire to continue active drinking.  ____________________________________________  FINAL CLINICAL IMPRESSION(S) / ED DIAGNOSES  Final diagnoses:  Acute alcoholic gastritis without hemorrhage  Dehydration  Non-intractable vomiting with nausea, unspecified vomiting type     MEDICATIONS GIVEN DURING THIS VISIT:  Medications  sodium chloride 0.9 % bolus 1,000 mL (has no administration in time range)  sodium chloride 0.9 % bolus 1,000 mL (0 mLs Intravenous Stopped 07/01/19 2155)  LORazepam (ATIVAN) injection 2 mg (2 mg Intravenous Given 07/01/19 2122)  ondansetron (ZOFRAN) injection 4 mg (4 mg Intravenous Given 07/01/19 2146)  sodium chloride 0.9 % bolus 1,000 mL (0 mLs Intravenous Stopped 07/02/19 0111)  iohexol (OMNIPAQUE) 300 MG/ML solution 100 mL (100 mLs Intravenous Contrast Given 07/01/19 2133)  LORazepam (ATIVAN) injection 1 mg (1 mg Intravenous Given 07/01/19 2336)  alum & mag hydroxide-simeth (MAALOX/MYLANTA) 200-200-20 MG/5ML suspension 30 mL (30 mLs Oral Given 07/01/19 2332)    And  lidocaine (XYLOCAINE) 2 % viscous mouth solution 15 mL (15 mLs Oral Given 07/01/19 2332)     ED Discharge Orders         Ordered    ondansetron (ZOFRAN ODT) 4 MG disintegrating tablet  Every 8 hours PRN     07/01/19 2348    promethazine (PHENERGAN) 25 MG tablet  Every 8 hours PRN     07/01/19 2348    pantoprazole (PROTONIX) 40 MG tablet  Daily     07/01/19 2348           Note:   This document was prepared using  [employer] forces training and education officer and may include unintentional dictation errors.   Duffy Bruce, MD 07/02/19 [phone]

## 2019-07-01 NOTE — Discharge Instructions (Addendum)
Take your nausea medications regularly for the next 2-3 days. Start with the Zofran and take the Phenergan if needed, though this can make you tired.  Decrease/limit the amount of alcohol you take in to help your stomach.  Start the prescribed antacid medicine. You can use over-the-counter Maalox/antacid or Tums as well.  Eat a bland, non-spicy diet for the next several days.

## 2019-07-04 ENCOUNTER — Ambulatory Visit (INDEPENDENT_AMBULATORY_CARE_PROVIDER_SITE_OTHER): Payer: Commercial Managed Care - PPO | Admitting: Family Medicine

## 2019-07-04 ENCOUNTER — Encounter: Payer: Self-pay | Admitting: Family Medicine

## 2019-07-04 ENCOUNTER — Other Ambulatory Visit: Payer: Self-pay

## 2019-07-04 DIAGNOSIS — R110 Nausea: Secondary | ICD-10-CM

## 2019-07-04 DIAGNOSIS — F1010 Alcohol abuse, uncomplicated: Secondary | ICD-10-CM

## 2019-07-04 DIAGNOSIS — K5730 Diverticulosis of large intestine without perforation or abscess without bleeding: Secondary | ICD-10-CM | POA: Diagnosis not present

## 2019-07-04 DIAGNOSIS — G250 Essential tremor: Secondary | ICD-10-CM | POA: Diagnosis not present

## 2019-07-04 DIAGNOSIS — K2920 Alcoholic gastritis without bleeding: Secondary | ICD-10-CM | POA: Diagnosis not present

## 2019-07-04 MED ORDER — ONDANSETRON 4 MG PO TBDP: 4.0000 mg | ORAL_TABLET | Freq: Three times a day (TID) | ORAL | 1 refills | Status: DC | PRN

## 2019-07-04 NOTE — Patient Instructions (Addendum)
Thank you for coming to the office today.  I dont think you had diverticulitis, based on CT it was not shown.  Likely from alcohol or viral GI symptoms causing nausea, vomiting dehydration  Continue Omeprazole 40mg  twice a day.  Stop Phenergan when done.  CONTINUE Zofran dissolving tab when needed for nausea, re ordered.  Tremors likely essential tremors - please look into this diagnosis and consider it. In future we can consider medicines or referral to neurology specialist  For alcohol try to cut back and wean down, try not to quit cold [place] until you are mentally and physically ready.  We can help with medicines to reduce alcohol cravings and symptoms of withdrawal when you are ready. But right now goal is to feel better first and get nutrition back on track.   Vitamins - B1 (Thiamine) and Folic Acid, and 123456 - daily dosing - can do a Multivitamin with most of these as well.  If worsening symptoms despite medicine, uncontrolled tremors, fever, chills sweats, confusion, hallucinations, seizure - emergency call 911, may need hospital detox inpatient  PSYCHIATRY / [place]  Self referral for substance program if need  [facility] in [place], [facility]  Address: [address], [place], [place] [address]  Phone: [phone]  [employer], available walk-in 9am-4pm M-F [address], [place] [address] Hours: [place] (M-F, walk in available) [phone]  Alcoholics Anonymous (AA)  [place] [address] ([place]) Web Site: [employer] Answering Service: [phone]      Please schedule a Follow-up Appointment to: Return if symptoms worsen or fail to improve, for alcohol / gastritis / nausea vomiting.  If you have any other questions or concerns, please feel free to call the office or send a message through [place]. You may also schedule an earlier appointment if necessary.  Additionally,  you may be receiving a survey about your experience at our office within a few days to 1 week by e-mail or mail. We value your feedback.  Nobie Putnam, DO [place]

## 2019-07-04 NOTE — Progress Notes (Signed)
Virtual Visit via Telephone The purpose of this virtual visit is to provide medical care while limiting exposure to the novel coronavirus (COVID19) for both patient and office staff.  Consent was obtained for phone visit:  Yes.   Answered questions that patient had about telehealth interaction:  Yes.   I discussed the limitations, risks, security and privacy concerns of performing an evaluation and management service by telephone. I also discussed with the patient that there may be a patient responsible charge related to this service. The patient expressed understanding and agreed to proceed.  Patient Location: Home Provider Location: [facility])  ---------------------------------------------------------------------- Chief Complaint  Patient presents with  . Hospitalization Follow-up    Acute alcoholic gastritis without hemorrhage, with vomiting x 3 days ago. Patient has noticed improvment with the medication that was given.    S: Reviewed CMA documentation. I have called patient and gathered additional HPI as follows:  ED FOLLOW-UP VISIT  Hospital/Location: [facility] Date of ED Visit: 07/01/19  Reason for Presenting to ED: nausea vomiting dehydration Primary (+Secondary) Diagnosis: Acute alcoholic gastritis, nausea vomiting dehydration  FOLLOW-UP  - ED provider note and record have been reviewed - Patient presents today about 3 days after recent ED visit. Brief summary of recent course, patient describes course from, 06/26/19 to 07/01/19 he had persistent vomiting and couldn't keep down any food/fluids He passed out on 07/01/19 and went to hospital ED, dx with dehydration and diverticulitis, also gastritis thought may be alcoholic triggered - had some tremors and tachycardia, given Lorazepam Currentlying having loose stools, he has history of diverticulitis in past, but they said only diverticulosis on imaging now Treated with Zofran and Promethazine for nausea Was on  Omeprazole 40mg  twice a day, then they switched him to Pantoprazole 40mg  daily He had negative COVID19 test prior. Now back to regular diet solids, drinking plenty of water and low calorie gatorade propel - tolerating well without nausea vomiting Fam history of Alcohol problem He has not quit alcohol. Not interested in program or rehab. He asks about meds to treat it in future.   Additional topic today - Essential tremors, he admits chronic problem since he was a teenager, has never been treated, but has some shaking or tremor episodes that bother him especially if he is active trying to complete a task.  Denies any high risk travel to areas of current concern for COVID19. Denies any known or suspected exposure to person with or possibly with COVID19.  Denies any fevers, chills, sweats, body ache, cough, shortness of breath, sinus pain or pressure, headache, abdominal pain, diarrhea  Past Medical History:  Diagnosis Date  . Diverticulosis   . Gastric ulcer   . GERD (gastroesophageal reflux disease)   . History of hiatal hernia   . Hypertension   . 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 Smoking status: Former Smoker    Packs/day: 2.00    Years: 25.00    Pack years: 50.00    Types: Cigarettes    Quit date: 05/31/1987    Years since quitting: 32.1  . Smokeless tobacco: Former Network engineer Use Topics  . Alcohol use: Yes    Comment: 8-10 beers per day  . Drug use: No    Current Outpatient Medications:  .  amLODipine-benazepril (LOTREL) 10-40 MG capsule, TAKE 1 CAPSULE BY MOUTH EVERY DAY, Disp: 90 capsule, Rfl: 1 .  EUCRISA 2 %  OINT, , Disp: , Rfl:  .  ketoconazole (NIZORAL) 2 % cream, Apply 1 application topically daily., Disp: 60 g, Rfl: 2 .  omeprazole (PRILOSEC) 40 MG capsule, TAKE 1 CAPSULE BY MOUTH TWICE A DAY, Disp: 180 capsule, Rfl: 1 .  ondansetron (ZOFRAN ODT) 4 MG disintegrating tablet,  Take 1 tablet (4 mg total) by mouth every 8 (eight) hours as needed for nausea or vomiting., Disp: 30 tablet, Rfl: 1 .  pantoprazole (PROTONIX) 40 MG tablet, Take 1 tablet (40 mg total) by mouth daily for 14 days., Disp: 14 tablet, Rfl: 0 .  ULTRAVATE 0.05 % LOTN, , Disp: , Rfl:   Depression screen [facility] 2/9 11/20/2018 11/08/2016 11/08/2016  Decreased Interest 0 0 0  Down, Depressed, Hopeless 0 0 0  PHQ - 2 Score 0 0 0  Altered sleeping - 0 -  Tired, decreased energy - 1 -  Change in appetite - 0 -  Feeling bad or failure about yourself  - 0 -  Trouble concentrating - 0 -  Moving slowly or fidgety/restless - 0 -  Suicidal thoughts - 0 -  PHQ-9 Score - 1 -    No flowsheet data found.  -------------------------------------------------------------------------- O: No physical exam performed due to remote telephone encounter.  Lab results reviewed.  I have personally reviewed the radiology report from 07/01/19 CT Abdomen.  CT ABDOMEN PELVIS W CONTRASTPerformed 07/01/2019 Final result  Study Result CLINICAL DATA: Nausea and vomiting with left lower quadrant pain  EXAM: CT ABDOMEN AND PELVIS WITH CONTRAST  TECHNIQUE: Multidetector CT imaging of the abdomen and pelvis was performed using the standard protocol following bolus administration of intravenous contrast.  CONTRAST: 137mL OMNIPAQUE IOHEXOL 300 MG/ML SOLN  COMPARISON: None.  FINDINGS: Lower chest: No acute abnormality. Fatty infiltration of the liver is noted. The gallbladder is within normal limits.  Hepatobiliary: No focal liver abnormality is seen. No gallstones, gallbladder wall thickening, or biliary dilatation.  Pancreas: Unremarkable. No pancreatic ductal dilatation or surrounding inflammatory changes.  Spleen: Normal in size without focal abnormality.  Adrenals/Urinary Tract: Adrenal glands are within normal limits. Kidneys demonstrate a normal enhancement pattern bilaterally. No renal calculi are noted.  Fullness of the ureters is seen without calculi at likely related to partially distended bladder. Proximal duplication of the collecting system on the right is noted.  Stomach/Bowel: Moderate-sized hiatal hernia is seen. Diverticular change of the colon is noted without evidence of diverticulitis. No obstructive or inflammatory changes of the colon are seen. The appendix is unremarkable. No small bowel abnormality is seen.  Vascular/Lymphatic: Aortic atherosclerosis. No enlarged abdominal or pelvic lymph nodes.  Reproductive: Prostate is unremarkable.  Other: No abdominal wall hernia or abnormality. No abdominopelvic ascites.  Musculoskeletal: Degenerative changes of the lumbar spine are seen. Hemangioma is noted in the T11 vertebral body.  IMPRESSION: Diverticulosis without diverticulitis.  Mild fullness of the collecting systems bilaterally related to a distended bladder.  Moderate-sized sliding-type hiatal hernia.  Fatty liver.   Electronically Signed By: Inez Catalina M.D. On: 07/01/2019 21:43    Recent Results (from the past 2160 hour(s))  CBC with Differential     Status: Abnormal   Collection Time: 07/01/19  8:38 PM  Result Value Ref Range   WBC 9.1 4.0 - 10.5 K/uL   RBC 4.10 (L) 4.22 - 5.81 MIL/uL   Hemoglobin 13.1 13.0 - 17.0 g/dL   HCT 37.1 (L) 39.0 - 52.0 %   MCV 90.5 80.0 - 100.0 fL   MCH 32.0 26.0 - 34.0  pg   MCHC 35.3 30.0 - 36.0 g/dL   RDW 13.5 11.5 - 15.5 %   Platelets 269 150 - 400 K/uL   nRBC 0.0 0.0 - 0.2 %   Neutrophils Relative % 72 %   Neutro Abs 6.5 1.7 - 7.7 K/uL   Lymphocytes Relative 16 %   Lymphs Abs 1.4 0.7 - 4.0 K/uL   Monocytes Relative 11 %   Monocytes Absolute 1.0 0.1 - 1.0 K/uL   Eosinophils Relative 0 %   Eosinophils Absolute 0.0 0.0 - 0.5 K/uL   Basophils Relative 1 %   Basophils Absolute 0.1 0.0 - 0.1 K/uL   Immature Granulocytes 0 %   Abs Immature Granulocytes 0.02 0.00 - 0.07 K/uL    Comment: Performed at [facility], [address] [place], [place] [address]  Comprehensive metabolic panel     Status: Abnormal   Collection Time: 07/01/19  8:38 PM  Result Value Ref Range   Sodium 130 (L) 135 - 145 mmol/L   Potassium 3.1 (L) 3.5 - 5.1 mmol/L   Chloride 94 (L) 98 - 111 mmol/L   CO2 21 (L) 22 - 32 mmol/L   Glucose, Bld 108 (H) 70 - 99 mg/dL   BUN 7 6 - 20 mg/dL   Creatinine, Ser 0.50 (L) 0.61 - 1.24 mg/dL   Calcium 8.8 (L) 8.9 - 10.3 mg/dL   Total Protein 7.3 6.5 - 8.1 g/dL   Albumin 4.2 3.5 - 5.0 g/dL   AST 62 (H) 15 - 41 U/L   ALT 45 (H) 0 - 44 U/L   Alkaline Phosphatase 49 38 - 126 U/L   Total Bilirubin 1.0 0.3 - 1.2 mg/dL   GFR calc non Af Amer >60 >60 mL/min   GFR calc Af Amer >60 >60 mL/min   Anion gap 15 5 - 15    Comment: Performed at [facility], [address] [place], [place] [address]  Lipase, blood     Status: None   Collection Time: 07/01/19  8:38 PM  Result Value Ref Range   Lipase 43 11 - 51 U/L    Comment: Performed at [facility], [address], [place], [place] [address]  Magnesium     Status: None   Collection Time: 07/01/19  8:38 PM  Result Value Ref Range   Magnesium 1.8 1.7 - 2.4 mg/dL    Comment: Performed at [facility], [address] [place], [place] [address]  Ethanol     Status: Abnormal   Collection Time: 07/01/19  8:38 PM  Result Value Ref Range   Alcohol, Ethyl (B) 56 (H) <10 mg/dL    Comment: (NOTE) Lowest detectable limit for serum alcohol is 10 mg/dL. For medical purposes only. Performed at [facility], [address] [place], [place] [address]   Urinalysis, Complete w Microscopic     Status: Abnormal   Collection Time: 07/01/19  8:59 PM  Result Value Ref Range   Color, Urine STRAW (A) YELLOW   APPearance CLEAR (A) CLEAR   Specific Gravity, Urine 1.005 1.005 - 1.030   pH 7.0 5.0 - 8.0   Glucose, UA NEGATIVE NEGATIVE mg/dL   Hgb urine dipstick SMALL (A) NEGATIVE   Bilirubin Urine  NEGATIVE NEGATIVE   Ketones, ur 20 (A) NEGATIVE mg/dL   Protein, ur NEGATIVE NEGATIVE mg/dL   Nitrite NEGATIVE NEGATIVE   Leukocytes,Ua NEGATIVE NEGATIVE   RBC / HPF 6-10 0 - 5 RBC/hpf   WBC, UA  0-5 0 - 5 WBC/hpf   Bacteria, UA NONE SEEN NONE SEEN   Squamous Epithelial / LPF NONE SEEN 0 - 5    Comment: Performed at [facility], [address] [place], [place] [address]    -------------------------------------------------------------------------- A&P:  Problem List Items Addressed This Visit    Essential tremor - Primary   Diverticulosis of large intestine without hemorrhage   Alcohol abuse, daily use    Other Visit Diagnoses    Acute alcoholic gastritis without hemorrhage       Nausea       Relevant Medications   ondansetron (ZOFRAN ODT) 4 MG disintegrating tablet     Clinically with constellation of issues seems related from nausea vomiting dehydration, likely triggered by alcohol gastritis or could possibly have been viral GI illness.  Now significantly improved, tolerating PO, no nausea vomiting  On anti emetic still, phenergan / zofran - advised him to DC Phenergan and refilled zofran ODT PRN now  Reduce alcohol to avoid gastritis, may not stop cold [place] now to avoid withdrawal, but advised once he is feeling better clinically from this acute flare, would require separate apt to discuss full details of alcohol cessation management outpatient treatment and in meantime, offered resources but he declined for treatment programs etc, printed via [facility]S  Switch protonix back to omeprazole 40mg  BID  Future may consider GI consultation if indicated, based on alcohol effect on his liver and gastritis / also diverticulosis  #Essential tremor - advised this is a chronic long term problem it sounds by his history for >30-40 years, advised him to follow up on this in future when clinically improved, I offered referral to Neurologist, would avoid new rx now given only  recovering still from acute illness  Meds ordered this encounter  Medications  . ondansetron (ZOFRAN ODT) 4 MG disintegrating tablet    Sig: Take 1 tablet (4 mg total) by mouth every 8 (eight) hours as needed for nausea or vomiting.    Dispense:  30 tablet    Refill:  1    Follow-up: - Return in 4-6 weeks as needed alcohol / gastritis / nausea vomiting  Patient verbalizes understanding with the above medical recommendations including the limitation of remote medical advice.  Specific follow-up and call-back criteria were given for patient to follow-up or seek medical care more urgently if needed.   - Time spent in direct consultation with patient on phone: 18 minutes  Nobie Putnam, [facility] 07/04/2019, 10:36 AM

## 2019-09-19 ENCOUNTER — Ambulatory Visit: Payer: Self-pay | Admitting: *Deleted

## 2019-09-19 ENCOUNTER — Inpatient Hospital Stay
Admission: EM | Admit: 2019-09-19 | Discharge: 2019-09-21 | DRG: 379 | Disposition: A | Discharge status: Home / Self Care | Payer: Commercial Managed Care - PPO | Attending: Internal Medicine | Admitting: Internal Medicine

## 2019-09-19 ENCOUNTER — Encounter: Payer: Self-pay | Admitting: Emergency Medicine

## 2019-09-19 ENCOUNTER — Other Ambulatory Visit: Payer: Self-pay

## 2019-09-19 ENCOUNTER — Emergency Department: Payer: Commercial Managed Care - PPO

## 2019-09-19 DIAGNOSIS — F1010 Alcohol abuse, uncomplicated: Secondary | ICD-10-CM | POA: Diagnosis present

## 2019-09-19 DIAGNOSIS — Z87891 Personal history of nicotine dependence: Secondary | ICD-10-CM

## 2019-09-19 DIAGNOSIS — K5733 Diverticulitis of large intestine without perforation or abscess with bleeding: Principal | ICD-10-CM | POA: Diagnosis present

## 2019-09-19 DIAGNOSIS — I10 Essential (primary) hypertension: Secondary | ICD-10-CM | POA: Diagnosis not present

## 2019-09-19 DIAGNOSIS — K219 Gastro-esophageal reflux disease without esophagitis: Secondary | ICD-10-CM | POA: Diagnosis present

## 2019-09-19 DIAGNOSIS — Z8711 Personal history of peptic ulcer disease: Secondary | ICD-10-CM

## 2019-09-19 DIAGNOSIS — K5792 Diverticulitis of intestine, part unspecified, without perforation or abscess without bleeding: Secondary | ICD-10-CM | POA: Diagnosis present

## 2019-09-19 DIAGNOSIS — Z20822 Contact with and (suspected) exposure to covid-19: Secondary | ICD-10-CM | POA: Diagnosis present

## 2019-09-19 DIAGNOSIS — K921 Melena: Secondary | ICD-10-CM | POA: Diagnosis not present

## 2019-09-19 DIAGNOSIS — G250 Essential tremor: Secondary | ICD-10-CM | POA: Diagnosis present

## 2019-09-19 LAB — URINALYSIS, COMPLETE (UACMP) WITH MICROSCOPIC
Bacteria, UA: NONE SEEN
Bilirubin Urine: NEGATIVE
Glucose, UA: NEGATIVE mg/dL
Hgb urine dipstick: NEGATIVE
Ketones, ur: 5 mg/dL — AB
Leukocytes,Ua: NEGATIVE
Nitrite: NEGATIVE
Protein, ur: NEGATIVE mg/dL
Specific Gravity, Urine: 1.023 (ref 1.005–1.030)
Squamous Epithelial / HPF: NONE SEEN (ref 0–5)
WBC, UA: NONE SEEN WBC/hpf (ref 0–5)
pH: 6 (ref 5.0–8.0)

## 2019-09-19 LAB — CBC
HCT: 37.8 % — ABNORMAL LOW (ref 39.0–52.0)
HCT: 38.2 % — ABNORMAL LOW (ref 39.0–52.0)
HCT: 33 % — ABNORMAL LOW (ref 39.0–52.0)
HCT: 31.7 % — ABNORMAL LOW (ref 39.0–52.0)
Hemoglobin: 13.5 g/dL (ref 13.0–17.0)
Hemoglobin: 13 g/dL (ref 13.0–17.0)
Hemoglobin: 11.6 g/dL — ABNORMAL LOW (ref 13.0–17.0)
Hemoglobin: 10.8 g/dL — ABNORMAL LOW (ref 13.0–17.0)
MCH: 32.8 pg (ref 26.0–34.0)
MCH: 32.3 pg (ref 26.0–34.0)
MCH: 32.3 pg (ref 26.0–34.0)
MCH: 32.7 pg (ref 26.0–34.0)
MCHC: 35.7 g/dL (ref 30.0–36.0)
MCHC: 34 g/dL (ref 30.0–36.0)
MCHC: 35.2 g/dL (ref 30.0–36.0)
MCHC: 34.1 g/dL (ref 30.0–36.0)
MCV: 91.7 fL (ref 80.0–100.0)
MCV: 94.8 fL (ref 80.0–100.0)
MCV: 91.9 fL (ref 80.0–100.0)
MCV: 96.1 fL (ref 80.0–100.0)
Platelets: 351 10*3/uL (ref 150–400)
Platelets: 380 10*3/uL (ref 150–400)
Platelets: 339 10*3/uL (ref 150–400)
Platelets: 318 10*3/uL (ref 150–400)
RBC: 4.12 MIL/uL — ABNORMAL LOW (ref 4.22–5.81)
RBC: 4.03 MIL/uL — ABNORMAL LOW (ref 4.22–5.81)
RBC: 3.59 MIL/uL — ABNORMAL LOW (ref 4.22–5.81)
RBC: 3.3 MIL/uL — ABNORMAL LOW (ref 4.22–5.81)
RDW: 14 % (ref 11.5–15.5)
RDW: 14 % (ref 11.5–15.5)
RDW: 14.1 % (ref 11.5–15.5)
RDW: 13.9 % (ref 11.5–15.5)
WBC: 12.6 10*3/uL — ABNORMAL HIGH (ref 4.0–10.5)
WBC: 12.5 10*3/uL — ABNORMAL HIGH (ref 4.0–10.5)
WBC: 10.1 10*3/uL (ref 4.0–10.5)
WBC: 9 10*3/uL (ref 4.0–10.5)
nRBC: 0 % (ref 0.0–0.2)
nRBC: 0 % (ref 0.0–0.2)
nRBC: 0 % (ref 0.0–0.2)
nRBC: 0 % (ref 0.0–0.2)

## 2019-09-19 LAB — COMPREHENSIVE METABOLIC PANEL
ALT: 29 U/L (ref 0–44)
ALT: 24 U/L (ref 0–44)
AST: 37 U/L (ref 15–41)
AST: 30 U/L (ref 15–41)
Albumin: 3.9 g/dL (ref 3.5–5.0)
Albumin: 3.5 g/dL (ref 3.5–5.0)
Alkaline Phosphatase: 55 U/L (ref 38–126)
Alkaline Phosphatase: 46 U/L (ref 38–126)
Anion gap: 11 (ref 5–15)
Anion gap: 10 (ref 5–15)
BUN: 6 mg/dL (ref 6–20)
BUN: 7 mg/dL (ref 6–20)
CO2: 23 mmol/L (ref 22–32)
CO2: 22 mmol/L (ref 22–32)
Calcium: 8.6 mg/dL — ABNORMAL LOW (ref 8.9–10.3)
Calcium: 8.4 mg/dL — ABNORMAL LOW (ref 8.9–10.3)
Chloride: 100 mmol/L (ref 98–111)
Chloride: 103 mmol/L (ref 98–111)
Creatinine, Ser: 0.57 mg/dL — ABNORMAL LOW (ref 0.61–1.24)
Creatinine, Ser: 0.64 mg/dL (ref 0.61–1.24)
GFR calc Af Amer: >60 mL/min (ref >60)
GFR calc Af Amer: >60 mL/min (ref >60)
GFR calc non Af Amer: >60 mL/min (ref >60)
GFR calc non Af Amer: >60 mL/min (ref >60)
Glucose, Bld: 97 mg/dL (ref 70–99)
Glucose, Bld: 209 mg/dL — ABNORMAL HIGH (ref 70–99)
Potassium: 3.6 mmol/L (ref 3.5–5.1)
Potassium: 3.4 mmol/L — ABNORMAL LOW (ref 3.5–5.1)
Sodium: 134 mmol/L — ABNORMAL LOW (ref 135–145)
Sodium: 135 mmol/L (ref 135–145)
Total Bilirubin: 0.5 mg/dL (ref 0.3–1.2)
Total Bilirubin: 0.7 mg/dL (ref 0.3–1.2)
Total Protein: 7.4 g/dL (ref 6.5–8.1)
Total Protein: 6.3 g/dL — ABNORMAL LOW (ref 6.5–8.1)

## 2019-09-19 LAB — LIPASE, BLOOD: Lipase: 41 U/L (ref 11–51)

## 2019-09-19 LAB — LACTIC ACID, PLASMA: Lactic Acid, Venous: 1.9 mmol/L (ref 0.5–1.9)

## 2019-09-19 LAB — RESPIRATORY PANEL BY RT PCR (FLU A&B, COVID)
Influenza A by PCR: NEGATIVE
Influenza B by PCR: NEGATIVE
SARS Coronavirus 2 by RT PCR: NEGATIVE

## 2019-09-19 LAB — TYPE AND SCREEN
ABO/RH(D): A POS
Antibody Screen: NEGATIVE

## 2019-09-19 LAB — APTT: aPTT: 30 seconds (ref 24–36)

## 2019-09-19 LAB — PROTIME-INR
INR: 0.9 (ref 0.8–1.2)
Prothrombin Time: 12.4 seconds (ref 11.4–15.2)

## 2019-09-19 IMAGING — CT CT ABD-PELV W/ CM
2 of 5 series · 15 of 46 positions shown, 17 images · IV contrast (APPLIED)
Comparison: 07/01/2019

CLINICAL DATA: Constipated for several days. Took a laxative
yesterday. Five bloody stools today. History of diverticulitis.
Abdominal crampy pain.

EXAM:
CT ABDOMEN AND PELVIS WITH CONTRAST
TECHNIQUE: Multidetector CT imaging of the abdomen and pelvis was performed
using the standard protocol following bolus administration of
intravenous contrast.
CONTRAST:  100mL OMNIPAQUE IOHEXOL 300 MG/ML  SOLN

[Series 2: routine abd/pel with · axial · 0.70mm/px · z∈[-462,-22]mm · 12 of 100 slices shown, 14 images]
[im 6/100  soft-tissue]
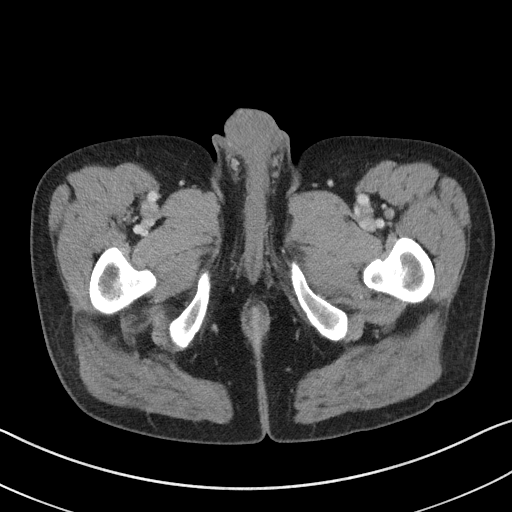
[im 6/100  bone]
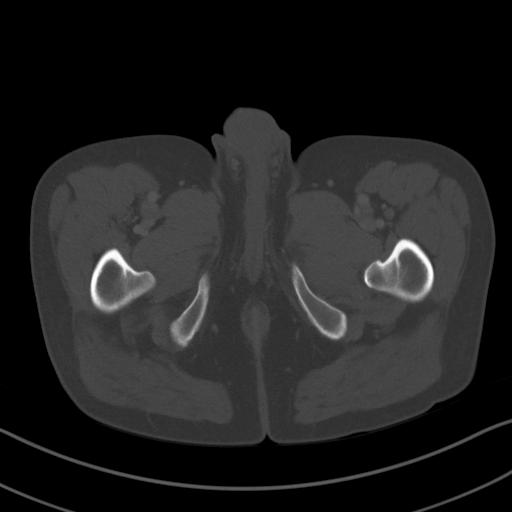
[im 17/100  soft-tissue]
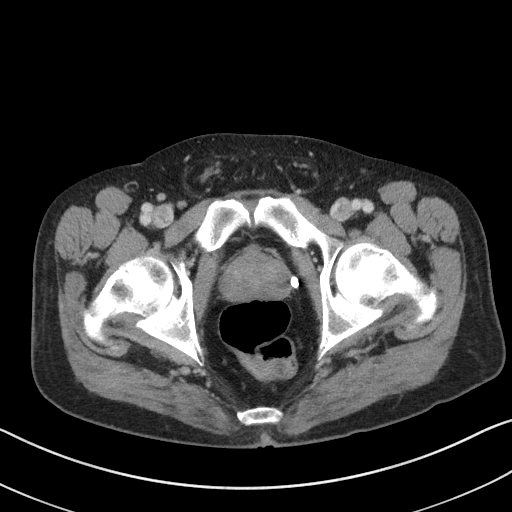
[im 23/100  soft-tissue]
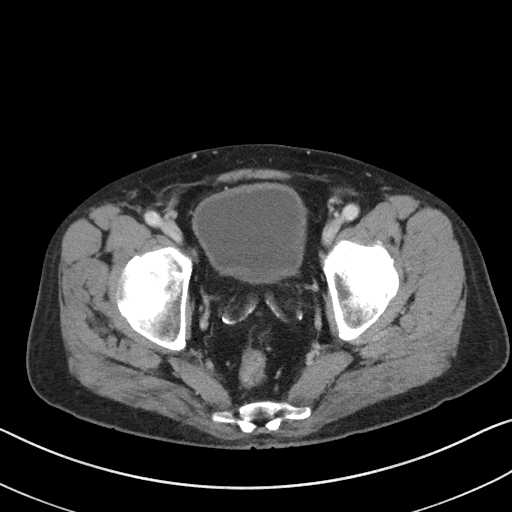
[im 28/100  soft-tissue]
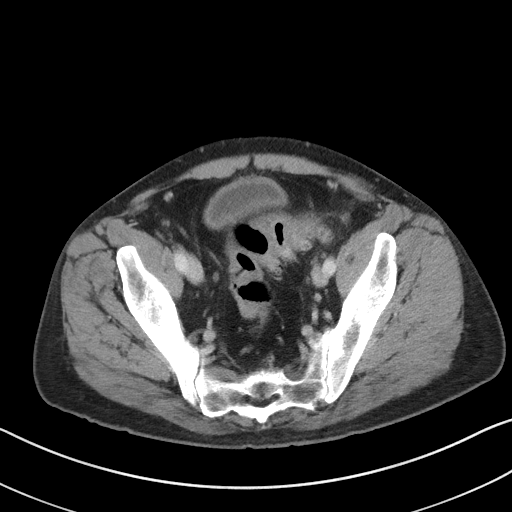
[im 39/100  soft-tissue]
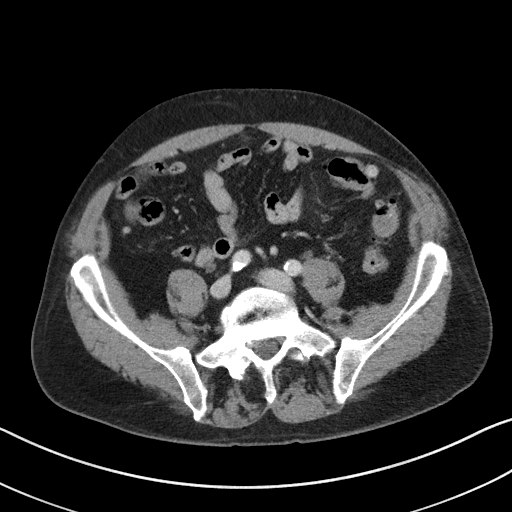
[im 45/100  soft-tissue]
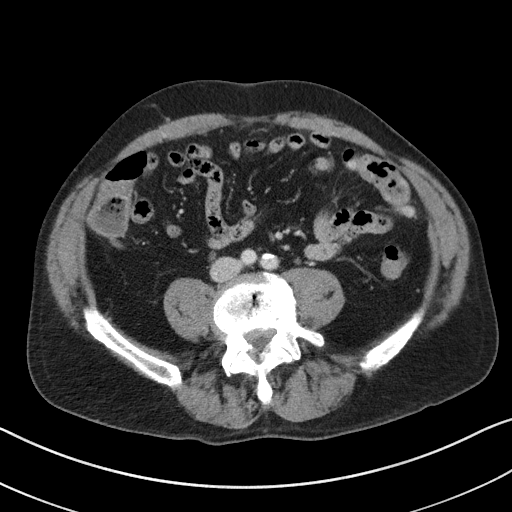
[im 56/100  soft-tissue]
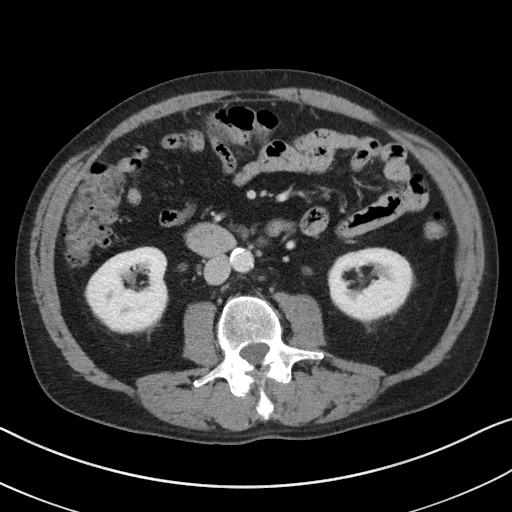
[im 61/100  soft-tissue]
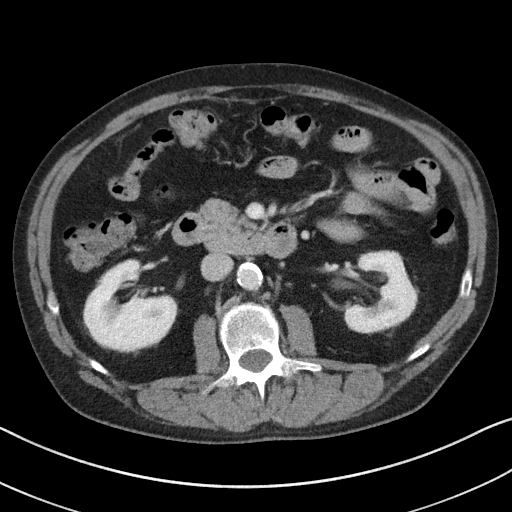
[im 72/100  soft-tissue]
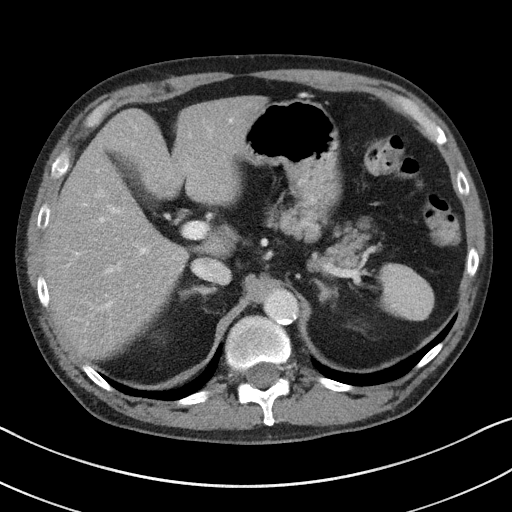
[im 72/100  bone]
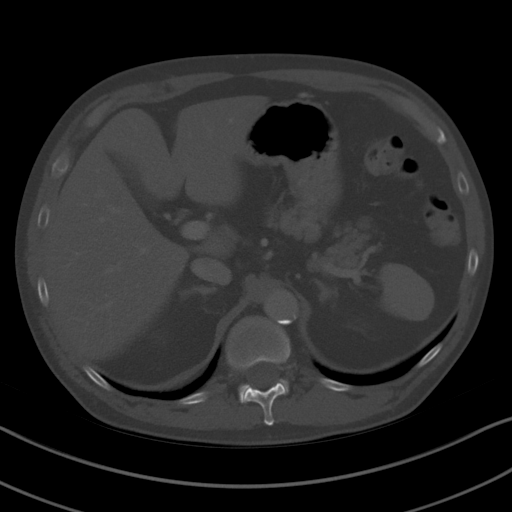
[im 78/100  soft-tissue]
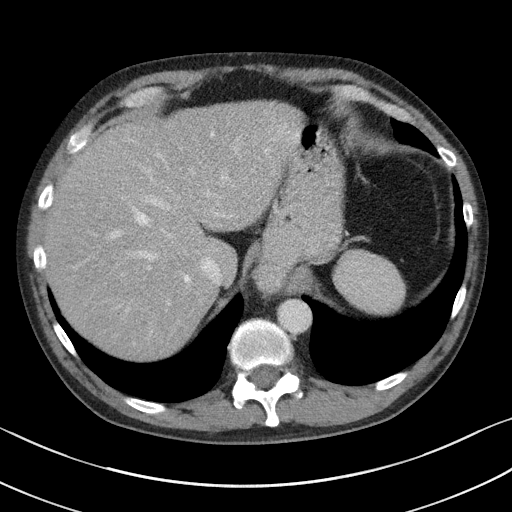
[im 83/100  soft-tissue]
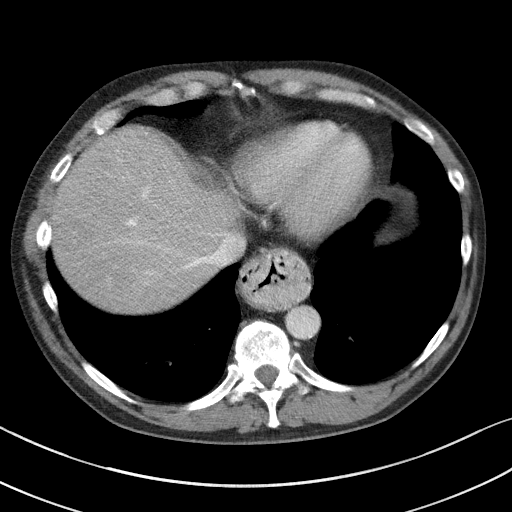
[im 94/100  soft-tissue]
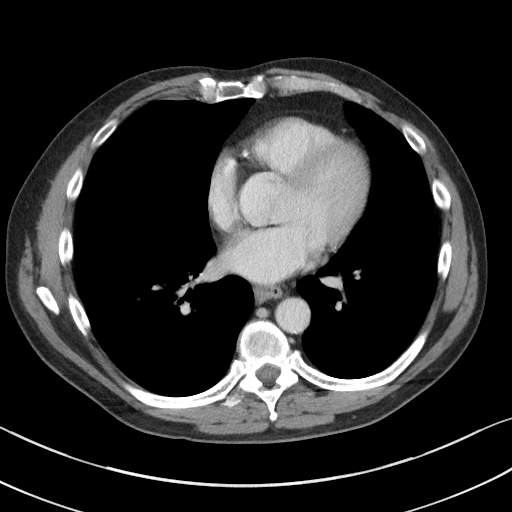

[Series 5: coronal st · coronal · 0.72mm/px · 3 of 94 slices shown]
[im 32/94  soft-tissue]
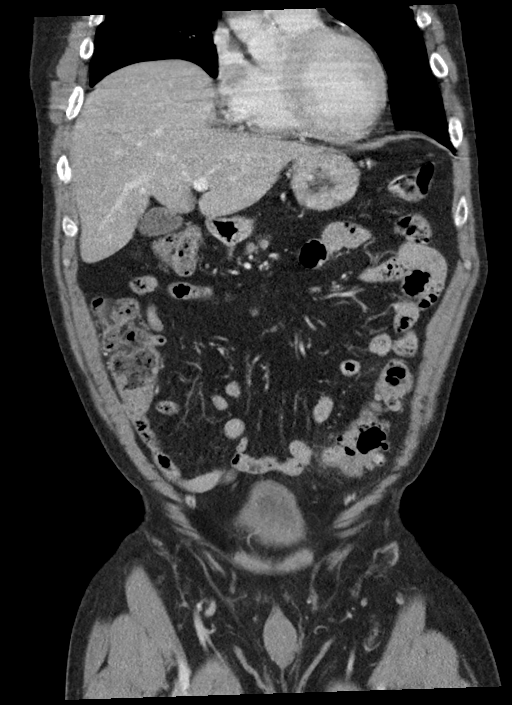
[im 42/94  soft-tissue]
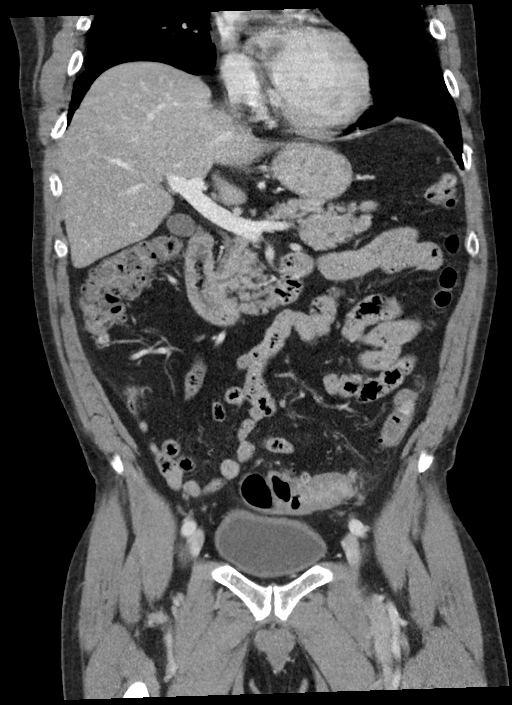
[im 52/94  soft-tissue]
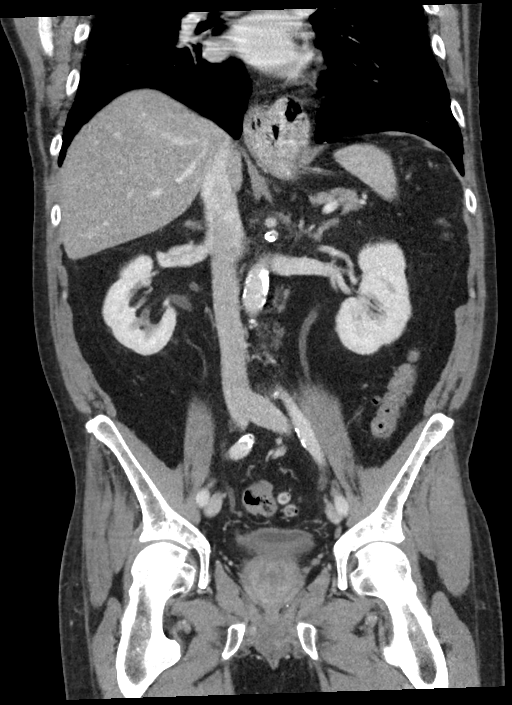

[15 of 46 positions shown; findings below may reference images not displayed]

FINDINGS: Lower chest: Clear lung bases.  Heart normal in size.

Hepatobiliary: Liver normal in size. Overall decreased liver
attenuation consistent with fatty infiltration. No liver mass or
focal lesion. Normal gallbladder. No bile duct dilation.

Pancreas: Unremarkable. No pancreatic ductal dilatation or
surrounding inflammatory changes.

Spleen: Normal in size without focal abnormality.

Adrenals/Urinary Tract: Adrenal glands are unremarkable. Kidneys are
normal, without renal calculi, focal lesion, or hydronephrosis.
Bladder is unremarkable.

Stomach/Bowel: Subtle inflammation adjacent to the proximal sigmoid
colon, centered on an ill-defined diverticulum. No extraluminal air.
No fluid collection to suggest an abscess. This portion of the
sigmoid colon shows mild wall thickening.

There are numerous diverticula throughout the remainder of the
colon, mostly on the left. No other evidence of colonic
inflammation.

Small to moderate hiatal hernia, stable. Stomach otherwise
unremarkable. Normal small bowel. Normal appendix.

Vascular/Lymphatic: Aortic atherosclerosis. No aneurysm. No enlarged
lymph nodes.

Reproductive: Enlarged prostate measuring 5.0 x 3.3 x 3.4 cm.

Other: No abdominal wall hernia or abnormality. No abdominopelvic
ascites.

Musculoskeletal: No fracture or acute finding. No osteoblastic or
osteolytic lesions.
IMPRESSION: 1. Mild diverticulitis of the proximal sigmoid colon. No
complication. Specifically, no evidence of extraluminal air or
abscess.
2. No other acute abnormality within the abdomen or pelvis.
3. Hepatic steatosis.
4. Small to moderate hiatal hernia.
5. Aortic atherosclerosis.
6. Colonic diverticulosis.

## 2019-09-19 MED ORDER — PIPERACILLIN-TAZOBACTAM 3.375 G IVPB: 3.3750 g | Freq: Three times a day (TID) | INTRAVENOUS | Status: DC

## 2019-09-19 MED ORDER — ONDANSETRON HCL 4 MG/2ML IJ SOLN: 4.0000 mg | Freq: Three times a day (TID) | INTRAMUSCULAR | Status: DC | PRN

## 2019-09-19 MED ORDER — LORAZEPAM 2 MG PO TABS
0.0000 mg | ORAL_TABLET | Freq: Four times a day (QID) | ORAL | Status: DC
  Administered 2019-09-19: 1 mg via ORAL
  Administered 2019-09-20 (×2): 2 mg via ORAL
  Filled 2019-09-19 (×4): qty 1

## 2019-09-19 MED ORDER — AMLODIPINE BESYLATE 10 MG PO TABS
10.0000 mg | ORAL_TABLET | Freq: Every day | ORAL | Status: DC
  Administered 2019-09-20: 10 mg via ORAL
  Filled 2019-09-19: qty 1

## 2019-09-19 MED ORDER — IOHEXOL 300 MG/ML  SOLN
100.0000 mL | Freq: Once | INTRAMUSCULAR | Status: AC | PRN
  Administered 2019-09-19: 100 mL via INTRAVENOUS

## 2019-09-19 MED ORDER — METRONIDAZOLE IN NACL 5-0.79 MG/ML-% IV SOLN
500.0000 mg | Freq: Three times a day (TID) | INTRAVENOUS | Status: DC
  Filled 2019-09-19: qty 100

## 2019-09-19 MED ORDER — BENAZEPRIL HCL 20 MG PO TABS
40.0000 mg | ORAL_TABLET | Freq: Every day | ORAL | Status: DC
  Administered 2019-09-20: 40 mg via ORAL
  Filled 2019-09-19 (×2): qty 2

## 2019-09-19 MED ORDER — SODIUM CHLORIDE 0.9 % IV SOLN: Freq: Once | INTRAVENOUS | Status: DC

## 2019-09-19 MED ORDER — HYDRALAZINE HCL 20 MG/ML IJ SOLN: 5.0000 mg | INTRAMUSCULAR | Status: DC | PRN

## 2019-09-19 MED ORDER — LORAZEPAM 1 MG PO TABS
1.0000 mg | ORAL_TABLET | ORAL | Status: DC | PRN
  Administered 2019-09-19 – 2019-09-21 (×2): 1 mg via ORAL
  Filled 2019-09-19 (×2): qty 1

## 2019-09-19 MED ORDER — ACETAMINOPHEN 325 MG PO TABS: 650.0000 mg | ORAL_TABLET | Freq: Four times a day (QID) | ORAL | Status: DC | PRN

## 2019-09-19 MED ORDER — LORAZEPAM 2 MG PO TABS: 0.0000 mg | ORAL_TABLET | Freq: Two times a day (BID) | ORAL | Status: DC

## 2019-09-19 MED ORDER — FOLIC ACID 1 MG PO TABS
1.0000 mg | ORAL_TABLET | Freq: Every day | ORAL | Status: DC
  Administered 2019-09-19 – 2019-09-20 (×2): 1 mg via ORAL
  Filled 2019-09-19 (×2): qty 1

## 2019-09-19 MED ORDER — PANTOPRAZOLE SODIUM 40 MG PO TBEC
40.0000 mg | DELAYED_RELEASE_TABLET | Freq: Every day | ORAL | Status: DC
  Administered 2019-09-20: 40 mg via ORAL
  Filled 2019-09-19: qty 1

## 2019-09-19 MED ORDER — SODIUM CHLORIDE 0.9 % IV BOLUS: 1000.0000 mL | Freq: Once | INTRAVENOUS | Status: DC

## 2019-09-19 MED ORDER — THIAMINE HCL 100 MG/ML IJ SOLN: 100.0000 mg | Freq: Every day | INTRAMUSCULAR | Status: DC

## 2019-09-19 MED ORDER — MORPHINE SULFATE (PF) 2 MG/ML IV SOLN: 2.0000 mg | INTRAVENOUS | Status: DC | PRN

## 2019-09-19 MED ORDER — ADULT MULTIVITAMIN W/MINERALS CH
1.0000 | ORAL_TABLET | Freq: Every day | ORAL | Status: DC
  Administered 2019-09-19 – 2019-09-20 (×2): 1 via ORAL
  Filled 2019-09-19 (×2): qty 1

## 2019-09-19 MED ORDER — AMLODIPINE BESY-BENAZEPRIL HCL 10-40 MG PO CAPS: 1.0000 | ORAL_CAPSULE | Freq: Every day | ORAL | Status: DC

## 2019-09-19 MED ORDER — LORAZEPAM 2 MG/ML IJ SOLN: 1.0000 mg | INTRAMUSCULAR | Status: DC | PRN

## 2019-09-19 MED ORDER — THIAMINE HCL 100 MG PO TABS
100.0000 mg | ORAL_TABLET | Freq: Every day | ORAL | Status: DC
  Administered 2019-09-19 – 2019-09-20 (×2): 100 mg via ORAL
  Filled 2019-09-19 (×2): qty 1

## 2019-09-19 MED ORDER — PIPERACILLIN-TAZOBACTAM 3.375 G IVPB
3.3750 g | Freq: Three times a day (TID) | INTRAVENOUS | Status: DC
  Administered 2019-09-19 – 2019-09-21 (×5): 3.375 g via INTRAVENOUS
  Filled 2019-09-19 (×5): qty 50

## 2019-09-19 MED ORDER — SODIUM CHLORIDE 0.9 % IV SOLN: INTRAVENOUS | Status: DC

## 2019-09-19 MED ORDER — CIPROFLOXACIN IN D5W 400 MG/200ML IV SOLN
400.0000 mg | Freq: Two times a day (BID) | INTRAVENOUS | Status: DC
  Administered 2019-09-19: 16:00:00 400 mg via INTRAVENOUS
  Filled 2019-09-19: qty 200

## 2019-09-19 NOTE — ED Notes (Signed)
Pt given urinal and informed that we need a urine sample. Pt unable to provide sample at this time

## 2019-09-19 NOTE — ED Triage Notes (Signed)
Pt to ER states he has been constipated for last several days and took a laxative yesterday.  States this morning he has had 5 bloody stools.  States there is no stool but only blood.  Pt states hx of diverticulitis, states abdominal cramping.

## 2019-09-19 NOTE — Telephone Encounter (Signed)
Pt called with complaints of bloody stools; they started 0400 09/19/19; he had 2 episodes; he states he had cramps and constipation 2 days prior to this; the pt says he has been diagnosed with diverticulitis; recommendations made per nurse triage protocol; he verbalized understanding; the pt is seen by Dr Parks Ranger, [place]; will route to office for notification of encounter.  Reason for Disposition . [1] MODERATE rectal bleeding (small blood clots, passing blood without stool, or toilet water turns red) AND [2] more than once a day  Answer Assessment - Initial Assessment Questions 1. APPEARANCE of BLOOD: "What color is it?" "Is it passed separately, on the surface of the stool, or mixed in with the stool?"      Bright red; no stool 2. AMOUNT: "How much blood was passed?"     unable to quantify 3. FREQUENCY: "How many times has blood been passed with the stools?"     twice 4. ONSET: "When was the blood first seen in the stools?" (Days or weeks)      0400 09/19/19 5. DIARRHEA: "Is there also some diarrhea?" If so, ask: "How many diarrhea stools were passed in past 24 hours?"      Yes 09/18/19 6. CONSTIPATION: "Do you have constipation?" If so, "How bad is it?"     Yes; took Magnesium citrate on 09/16/19 7. RECURRENT SYMPTOMS: "Have you had blood in your stools before?" If so, ask: "When was the last time?" and "What happened that time?"     Yes with hemorrhoid; blood on tissue 8. BLOOD THINNERS: "Do you take any blood thinners?" (e.g., Coumadin/warfarin, Pradaxa/dabigatran, aspirin)    Has been taking alka selttzer 9. OTHER SYMPTOMS: "Do you have any other symptoms?"  (e.g., abdominal pain, vomiting, dizziness, fever)     Dry mouth, abdominal cramping with discomfort rated 5 out of 10 10. PREGNANCY: "Is there any chance you are pregnant?" "When was your last menstrual period?"       n/a  Protocols used: RECTAL BLEEDING-A-AH

## 2019-09-19 NOTE — ED Provider Notes (Signed)
[facility] Emergency Department Provider Note  ____________________________________________   First MD Initiated Contact with Patient 09/19/19 1342     (approximate)  I have reviewed the triage vital signs and the nursing notes.   HISTORY  Chief Complaint Rectal Bleeding    HPI Luis Porter is a 59 y.o. male with history of diverticulosis, hypertension, GERD, here with blood in his stools.  The patient states that for the last 2 days, he has had intermittent left lower abdominal pain.  He has been constipated.  He took mag citrate yesterday and had a loose bowel movement.  Since then, he has had approximately 6-7 grossly bloody bowel movements with occasional maroon-colored stool in it.  He has had associated intermittent left lower abdominal cramping.  He is not on blood thinners.  No chest pain or shortness of breath.  No syncope.  He has a history of diverticular bleeds, denies any preceding melena.  No upper abdominal pain.  No other complaints.        Past Medical History:  Diagnosis Date  . Diverticulosis   . Gastric ulcer   . GERD (gastroesophageal reflux disease)   . History of hiatal hernia   . Hypertension   . Recurrent umbilical hernia with incarceration 11/08/2016   Previously repaired x 1    Patient Active Problem List   Diagnosis Date Noted  . GERD (gastroesophageal reflux disease) 09/19/2019  . Acute diverticulitis 09/19/2019  . Alcohol abuse 09/19/2019  . Essential tremor 07/04/2019  . Hx of adenomatous colonic polyps 01/06/2017  . Herniated lumbar intervertebral disc 05/18/2016  . Mixed hyperlipidemia 05/06/2014  . Benign non-nodular prostatic hyperplasia with lower urinary tract symptoms 03/31/2014  . Insomnia 03/03/2014  . Diverticulosis of large intestine without hemorrhage 03/03/2014  . Alcohol abuse, daily use 03/03/2014  . Essential hypertension 07/08/2011  . Hiatal hernia with gastroesophageal reflux 07/07/2011  .  Lumbago with sciatica 07/07/2011    Past Surgical History:  Procedure Laterality Date  . COLONOSCOPY WITH PROPOFOL N/A 12/30/2016   Procedure: COLONOSCOPY WITH PROPOFOL;  Surgeon: [name], MD;  Location: [facility] ENDOSCOPY;  Service: Endoscopy;  Laterality: N/A;  . ESOPHAGOGASTRODUODENOSCOPY (EGD) WITH PROPOFOL N/A 12/30/2016   Procedure: ESOPHAGOGASTRODUODENOSCOPY (EGD) WITH PROPOFOL;  Surgeon: [name], MD;  Location: [facility] ENDOSCOPY;  Service: Endoscopy;  Laterality: N/A;  . HERNIA REPAIR  [id number]   Umbilical Hernia Repair  . LUMBAR LAMINECTOMY/DECOMPRESSION MICRODISCECTOMY Right 05/18/2016   Procedure: right L4-5 microdiscectomy;  Surgeon: Blanche East, MD;  Location: [facility] ORS;  Service: Neurosurgery;  Laterality: Right;  . TONSILLECTOMY    . UMBILICAL HERNIA REPAIR N/A 06/29/2018   Procedure: LAPAROSCOPIC REPAIR OF RECURRENT UMBILICAL HERNIA WITH MESH;  Surgeon: Vickie Epley, MD;  Location: [facility] ORS;  Service: General;  Laterality: N/A;    Prior to Admission medications   Medication Sig Start Date End Date Taking? Authorizing Provider  amLODipine-benazepril (LOTREL) 10-40 MG capsule TAKE 1 CAPSULE BY MOUTH EVERY DAY 06/07/19  Yes Karamalegos, [name], DO  omeprazole (PRILOSEC) 40 MG capsule TAKE 1 CAPSULE BY MOUTH TWICE A DAY 06/07/19  Yes Karamalegos, Alexander J, DO  ondansetron (ZOFRAN ODT) 4 MG disintegrating tablet Take 1 tablet (4 mg total) by mouth every 8 (eight) hours as needed for nausea or vomiting. 07/04/19  Yes Karamalegos, [name], DO  pantoprazole (PROTONIX) 40 MG tablet Take 1 tablet (40 mg total) by mouth daily for 14 days. 07/01/19 09/19/19 Yes Duffy Bruce, MD  Vitamins/Minerals TABS Take by mouth.  Yes [provider]    Allergies Nsaids  Family History  Problem Relation Age of Onset  . Hypertension Mother   . Cancer Mother        Ovarian  . Hypertension Father   . Cancer Father        tongue  . Hypertension Brother   . Prostate  cancer Neg Hx   . Breast cancer Neg Hx   . Colon cancer Neg Hx   . Heart attack Neg Hx   . Stroke Neg Hx     Social History Social History   Tobacco Use  . Smoking status: Former Smoker    Packs/day: 2.00    Years: 25.00    Pack years: 50.00    Types: Cigarettes    Quit date: 05/31/1987    Years since quitting: 32.3  . Smokeless tobacco: Former Network engineer Use Topics  . Alcohol use: Yes    Comment: 8-10 beers per day  . Drug use: No    Review of Systems  Review of Systems  Constitutional: Positive for fatigue. Negative for chills and fever.  HENT: Negative for sore throat.   Respiratory: Negative for shortness of breath.   Cardiovascular: Negative for chest pain.  Gastrointestinal: Positive for abdominal pain and blood in stool.  Genitourinary: Negative for flank pain.  Musculoskeletal: Negative for neck pain.  Skin: Negative for rash and wound.  Allergic/Immunologic: Negative for immunocompromised state.  Neurological: Negative for weakness and numbness.  Hematological: Does not bruise/bleed easily.  All other systems reviewed and are negative.    ____________________________________________  PHYSICAL EXAM:      VITAL SIGNS: ED Triage Vitals  Enc Vitals Group     BP 09/19/19 0954 113/78     Pulse Rate 09/19/19 0954 92     Resp 09/19/19 0954 18     Temp 09/19/19 0954 98.1 F (36.7 C)     Temp src --      SpO2 09/19/19 0954 97 %     Weight 09/19/19 0951 160 lb (72.6 kg)     Height 09/19/19 0951 5\' 6"  (1.676 m)     Head Circumference --      Peak Flow --      Pain Score 09/19/19 0951 5     Pain Loc --      Pain Edu? --      Excl. in [place]? --      Physical Exam Vitals and nursing note reviewed.  Constitutional:      General: He is not in acute distress.    Appearance: He is well-developed.  HENT:     Head: Normocephalic and atraumatic.  Eyes:     Conjunctiva/sclera: Conjunctivae normal.  Cardiovascular:     Rate and Rhythm: Normal rate and  regular rhythm.     Heart sounds: Normal heart sounds. No murmur. No friction rub.  Pulmonary:     Effort: Pulmonary effort is normal. No respiratory distress.     Breath sounds: Normal breath sounds. No wheezing or rales.  Abdominal:     General: There is no distension.     Palpations: Abdomen is soft.     Tenderness: There is abdominal tenderness in the left lower quadrant.  Musculoskeletal:     Cervical back: Neck supple.  Skin:    General: Skin is warm.     Capillary Refill: Capillary refill takes less than 2 seconds.  Neurological:     Mental Status: He is alert and oriented to  person, place, and time.     Motor: No abnormal muscle tone.       ____________________________________________   LABS (all labs ordered are listed, but only abnormal results are displayed)  Labs Reviewed  COMPREHENSIVE METABOLIC PANEL - Abnormal; Notable for the following components:      Result Value   Sodium 134 (*)    Creatinine, Ser 0.57 (*)    Calcium 8.6 (*)    All other components within normal limits  CBC - Abnormal; Notable for the following components:   WBC 12.6 (*)    RBC 4.12 (*)    HCT 37.8 (*)    All other components within normal limits  URINALYSIS, COMPLETE (UACMP) WITH MICROSCOPIC - Abnormal; Notable for the following components:   Color, Urine STRAW (*)    APPearance CLEAR (*)    Ketones, ur 5 (*)    All other components within normal limits  CBC - Abnormal; Notable for the following components:   WBC 12.5 (*)    RBC 4.03 (*)    HCT 38.2 (*)    All other components within normal limits  RESPIRATORY PANEL BY RT PCR (FLU A&B, COVID)  CULTURE, BLOOD (ROUTINE X 2)  CULTURE, BLOOD (ROUTINE X 2)  LIPASE, BLOOD  LACTIC ACID, PLASMA  COMPREHENSIVE METABOLIC PANEL  CBC  CBC  CBC  APTT  PROTIME-INR  HIV ANTIBODY (ROUTINE TESTING W REFLEX)  TYPE AND SCREEN    ____________________________________________  EKG:   ________________________________________  RADIOLOGY All imaging, including plain films, CT scans, and ultrasounds, independently reviewed by me, and interpretations confirmed via formal radiology reads.  ED MD interpretation:   CT abdomen/pelvis: No diverticulitis, no perforation  Official radiology report(s): CT ABDOMEN PELVIS W CONTRAST  Result Date: 09/19/2019 CLINICAL DATA:  Constipated for several days. Took a laxative yesterday. Five bloody stools today. History of diverticulitis. Abdominal crampy pain. EXAM: CT ABDOMEN AND PELVIS WITH CONTRAST TECHNIQUE: Multidetector CT imaging of the abdomen and pelvis was performed using the standard protocol following bolus administration of intravenous contrast. CONTRAST:  145mL OMNIPAQUE IOHEXOL 300 MG/ML  SOLN COMPARISON:  07/01/2019 FINDINGS: Lower chest: Clear lung bases.  Heart normal in size. Hepatobiliary: Liver normal in size. Overall decreased liver attenuation consistent with fatty infiltration. No liver mass or focal lesion. Normal gallbladder. No bile duct dilation. Pancreas: Unremarkable. No pancreatic ductal dilatation or surrounding inflammatory changes. Spleen: Normal in size without focal abnormality. Adrenals/Urinary Tract: Adrenal glands are unremarkable. Kidneys are normal, without renal calculi, focal lesion, or hydronephrosis. Bladder is unremarkable. Stomach/Bowel: Subtle inflammation adjacent to the proximal sigmoid colon, centered on an ill-defined diverticulum. No extraluminal air. No fluid collection to suggest an abscess. This portion of the sigmoid colon shows mild wall thickening. There are numerous diverticula throughout the remainder of the colon, mostly on the left. No other evidence of colonic inflammation. Small to moderate hiatal hernia, stable. Stomach otherwise unremarkable. Normal small bowel. Normal appendix. Vascular/Lymphatic: Aortic atherosclerosis. No aneurysm. No enlarged lymph nodes. Reproductive: Enlarged  prostate measuring 5.0 x 3.3 x 3.4 cm. Other: No abdominal wall hernia or abnormality. No abdominopelvic ascites. Musculoskeletal: No fracture or acute finding. No osteoblastic or osteolytic lesions. IMPRESSION: 1. Mild diverticulitis of the proximal sigmoid colon. No complication. Specifically, no evidence of extraluminal air or abscess. 2. No other acute abnormality within the abdomen or pelvis. 3. Hepatic steatosis. 4. Small to moderate hiatal hernia. 5. Aortic atherosclerosis. 6. Colonic diverticulosis. Electronically Signed   By: Lajean Manes M.D.   On:  09/19/2019 15:14    ____________________________________________  PROCEDURES   Procedure(s) performed (including Critical Care):  Procedures  ____________________________________________  INITIAL IMPRESSION / MDM / [place] / ED COURSE  As part of my medical decision making, I reviewed the following data within the [place] notes reviewed and incorporated, Old chart reviewed, Notes from prior ED visits, and [place] Controlled Substance Database       *Luis Porter was evaluated in Emergency Department on 09/19/2019 for the symptoms described in the history of present illness. He was evaluated in the context of the global COVID-19 pandemic, which necessitated consideration that the patient might be at risk for infection with the SARS-CoV-2 virus that causes COVID-19. Institutional protocols and algorithms that pertain to the evaluation of patients at risk for COVID-19 are in a state of rapid change based on information released by regulatory bodies including the CDC and federal and state organizations. These policies and algorithms were followed during the patient's care in the ED.  Some ED evaluations and interventions may be delayed as a result of limited staffing during the pandemic.*     Medical Decision Making:  59 yo M here with mild LLQ pain and grossly bloody stool. Suspect mild diverticulitis  with bleeding. CBC shows mild leukocytosis but stable Hgb. He is not on blood thinners. Reviewed his prior labs and ED visits - he has a h/o known diverticulosis as well as gastritis. Denies any recent sx to suggest UGIB or PUD. Given his ongoing bleeding, will plan to admit for obs. IV Cipro/Flagyl and IVF started. NPO for now. He was monitored and remains HDS. CT obtained shows mild diverticulitis without surgical complication. No abscess.  ____________________________________________  FINAL CLINICAL IMPRESSION(S) / ED DIAGNOSES  Final diagnoses:  Acute diverticulitis     MEDICATIONS GIVEN DURING THIS VISIT:  Medications  ondansetron (ZOFRAN) injection 4 mg (has no administration in time range)  hydrALAZINE (APRESOLINE) injection 5 mg (has no administration in time range)  acetaminophen (TYLENOL) tablet 650 mg (has no administration in time range)  0.9 %  sodium chloride infusion ( Intravenous New Bag/Given 09/19/19 1555)  morphine 2 MG/ML injection 2 mg (has no administration in time range)  LORazepam (ATIVAN) tablet 1-4 mg (has no administration in time range)    Or  LORazepam (ATIVAN) injection 1-4 mg (has no administration in time range)  thiamine tablet 100 mg (has no administration in time range)    Or  thiamine (B-1) injection 100 mg (has no administration in time range)  folic acid (FOLVITE) tablet 1 mg (has no administration in time range)  multivitamin with minerals tablet 1 tablet (has no administration in time range)  LORazepam (ATIVAN) tablet 0-4 mg (has no administration in time range)    Followed by  LORazepam (ATIVAN) tablet 0-4 mg (has no administration in time range)  pantoprazole (PROTONIX) EC tablet 40 mg (has no administration in time range)  piperacillin-tazobactam (ZOSYN) IVPB 3.375 g (has no administration in time range)  sodium chloride 0.9 % bolus 1,000 mL (has no administration in time range)  amLODipine (NORVASC) tablet 10 mg (has no administration in  time range)  benazepril (LOTENSIN) tablet 40 mg (has no administration in time range)  iohexol (OMNIPAQUE) 300 MG/ML solution 100 mL (100 mLs Intravenous Contrast Given 09/19/19 1439)     ED Discharge Orders    None       Note:  This document was prepared using Dragon voice recognition software and may include unintentional  dictation errors.   Duffy Bruce, MD 09/19/19 [phone]

## 2019-09-19 NOTE — Consult Note (Signed)
Pharmacy Antibiotic Note  Luis Porter is a 59 y.o. male admitted on 09/19/2019 with Diverticulitis.  Pharmacy has been consulted for pip/tazo dosing.  Plan: Zosyn 3.375g IV q8h (4 hour infusion).  Height: 5\' 6"  (167.6 cm) Weight: 72.6 kg (160 lb) IBW/kg (Calculated) : 63.8  Temp (24hrs), Avg:98.1 F (36.7 C), Min:98.1 F (36.7 C), Max:98.1 F (36.7 C)  Recent Labs  Lab 09/19/19 1002 09/19/19 1404  WBC 12.6* 12.5*  CREATININE 0.57*  --   LATICACIDVEN  --  1.9    Estimated Creatinine Clearance: 91.9 mL/min (A) (by C-G formula based on SCr of 0.57 mg/dL (L)).    Allergies  Allergen Reactions  . Nsaids Other (See Comments)    Due to history of ulcers     Antimicrobials this admission: 4/22 cipro x 1   Dose adjustments this admission: None  Microbiology results: None  Thank you for allowing pharmacy to be a part of this patient's care.  Oswald Hillock, PharmD, BCPS 09/19/2019 4:48 PM

## 2019-09-19 NOTE — ED Notes (Signed)
Pt states coming into the ER due to having bright red blood in stool. Pt states it started at 4am, but was preceded with 2 days of abdominal cramps. Pt states history of diverticulosis.

## 2019-09-19 NOTE — H&P (Addendum)
History and Physical    Luis Porter [id number] DOB: Jan 28, 1961 DOA: 09/19/2019  Referring MD/NP/PA:   PCP: Olin Hauser, DO   Patient coming from:  The patient is coming from home.  At baseline, pt is independent for most of ADL.        Chief Complaint: Bloody stool and abdominal pain  HPI: Luis Porter is a 59 y.o. male with medical history significant of alcohol abuse, hypertension, GERD, gastric ulcer, diverticulitis, essential tremor, who presents with bloody stool and abdominal pain.  Patient states that he has had 8 episodes of bloody stool bowel movement. He does not have nausea or vomiting.  He has lower abdominal pain, which is sharp, constant, 5-7 out of 10 in severity, nonradiating.  No fever or chills.  Patient does not have chest pain, shortness breath, cough, symptoms of UTI or unilateral weakness.  ED Course: pt was found to have WBC 12.5, pending COVID-19 PCR, electrolytes renal function okay, hemoglobin 13.5 -->13.0, electrolyte renal function okay, temperature normal, blood pressure 131/89, heart rate 92, RR 18, oxygen saturation 92% on room air. CT of abdomen/pelvis that showed mild diverticulitis in proximal sigmoid colon. Patient is placed on MedSurg bed for observation.  Review of Systems:   General: no fevers, chills, no body weight gain, has fatigue HEENT: no blurry vision, hearing changes or sore throat Respiratory: no dyspnea, coughing, wheezing CV: no chest pain, no palpitations GI: no nausea, vomiting, has abdominal pain, bloody stool, no constipation GU: no dysuria, burning on urination, increased urinary frequency, hematuria  Ext: no leg edema Neuro: no unilateral weakness, numbness, or tingling, no vision change or hearing loss Skin: no rash, no skin tear. MSK: No muscle spasm, no deformity, no limitation of range of movement in spin Heme: No easy bruising.  Travel history: No recent long distant travel.  Allergy:  Allergies   Allergen Reactions  . Nsaids Other (See Comments)    Due to history of ulcers     Past Medical History:  Diagnosis Date  . Diverticulosis   . Gastric ulcer   . GERD (gastroesophageal reflux disease)   . History of hiatal hernia   . Hypertension   . 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 COLONOSCOPY WITH PROPOFOL N/A 12/30/2016   Procedure: COLONOSCOPY WITH PROPOFOL;  Surgeon: [name], MD;  Location: [facility] ENDOSCOPY;  Service: Endoscopy;  Laterality: N/A;  . ESOPHAGOGASTRODUODENOSCOPY (EGD) WITH PROPOFOL N/A 12/30/2016   Procedure: ESOPHAGOGASTRODUODENOSCOPY (EGD) WITH PROPOFOL;  Surgeon: [name], MD;  Location: [facility] ENDOSCOPY;  Service: Endoscopy;  Laterality: N/A;  . HERNIA REPAIR  [id number]   Umbilical Hernia Repair  . LUMBAR LAMINECTOMY/DECOMPRESSION MICRODISCECTOMY Right 05/18/2016   Procedure: right L4-5 microdiscectomy;  Surgeon: Blanche East, MD;  Location: [facility] ORS;  Service: Neurosurgery;  Laterality: Right;  . TONSILLECTOMY    . UMBILICAL HERNIA REPAIR N/A 06/29/2018   Procedure: LAPAROSCOPIC REPAIR OF RECURRENT UMBILICAL HERNIA WITH MESH;  Surgeon: Vickie Epley, MD;  Location: [facility] ORS;  Service: General;  Laterality: N/A;    Social History:  reports that he quit smoking about 32 years ago. His smoking use included cigarettes. He has a 50.00 pack-year smoking history. He has quit using smokeless tobacco. He reports current alcohol use. He reports that he does not use drugs.  Family History:  Family History  Problem Relation Age of Onset  . Hypertension Mother   . Cancer  Mother        Ovarian  . Hypertension Father   . Cancer Father        tongue  . Hypertension Brother   . Prostate cancer Neg Hx   . Breast cancer Neg Hx   . Colon cancer Neg Hx   . Heart attack Neg Hx   . Stroke Neg Hx      Prior to Admission medications   Medication Sig Start  Date End Date Taking? Authorizing Provider  amLODipine-benazepril (LOTREL) 10-40 MG capsule TAKE 1 CAPSULE BY MOUTH EVERY DAY 06/07/19  Yes Karamalegos, [name], DO  omeprazole (PRILOSEC) 40 MG capsule TAKE 1 CAPSULE BY MOUTH TWICE A DAY 06/07/19  Yes Karamalegos, Alexander J, DO  ondansetron (ZOFRAN ODT) 4 MG disintegrating tablet Take 1 tablet (4 mg total) by mouth every 8 (eight) hours as needed for nausea or vomiting. 07/04/19  Yes Karamalegos, [name], DO  pantoprazole (PROTONIX) 40 MG tablet Take 1 tablet (40 mg total) by mouth daily for 14 days. 07/01/19 09/19/19 Yes Duffy Bruce, MD  Vitamins/Minerals TABS Take by mouth.   Yes [provider]    Physical Exam: Vitals:   09/19/19 1412 09/19/19 1559 09/19/19 1700 09/19/19 1700  BP:  (!) 133/100 (!) 133/93 (!) 133/93  Pulse: 92 (!) 102 (!) 105 (!) 102  Resp:  16  18  Temp:      SpO2: 97% 96% 96% 97%  Weight:      Height:       General: Not in acute distress HEENT:       Eyes: PERRL, EOMI, no scleral icterus.       ENT: No discharge from the ears and nose, no pharynx injection, no tonsillar enlargement.        Neck: No JVD, no bruit, no mass felt. Heme: No neck lymph node enlargement. Cardiac: S1/S2, RRR, No murmurs, No gallops or rubs. Respiratory: No rales, wheezing, rhonchi or rubs. GI: Soft, nondistended, has tenderness in lower abdomen, no rebound pain, no organomegaly, BS present. GU: No hematuria Ext: No pitting leg edema bilaterally. 2+DP/PT pulse bilaterally. Musculoskeletal: No joint deformities, No joint redness or warmth, no limitation of ROM in spin. Skin: No rashes.  Neuro: Alert, oriented X3, cranial nerves II-XII grossly intact, moves all extremities normally.  Psych: Patient is not psychotic, no suicidal or hemocidal ideation.  Labs on Admission: I have personally reviewed following labs and imaging studies  CBC: Recent Labs  Lab 09/19/19 1002 09/19/19 1404  WBC 12.6* 12.5*  HGB 13.5 13.0    HCT 37.8* 38.2*  MCV 91.7 94.8  PLT 351 [id number]   Basic Metabolic Panel: Recent Labs  Lab 09/19/19 1002  NA 134*  K 3.6  CL 100  CO2 23  GLUCOSE 97  BUN 6  CREATININE 0.57*  CALCIUM 8.6*   GFR: Estimated Creatinine Clearance: 91.9 mL/min (A) (by C-G formula based on SCr of 0.57 mg/dL (L)). Liver Function Tests: Recent Labs  Lab 09/19/19 1002  AST 37  ALT 29  ALKPHOS 55  BILITOT 0.5  PROT 7.4  ALBUMIN 3.9   Recent Labs  Lab 09/19/19 1002  LIPASE 41   No results for input(s): AMMONIA in the last 168 hours. Coagulation Profile: No results for input(s): INR, PROTIME in the last 168 hours. Cardiac Enzymes: No results for input(s): CKTOTAL, CKMB, CKMBINDEX, TROPONINI in the last 168 hours. BNP (last 3 results) No results for input(s): PROBNP in the last 8760 hours. HbA1C: No results  for input(s): HGBA1C in the last 72 hours. CBG: No results for input(s): GLUCAP in the last 168 hours. Lipid Profile: No results for input(s): CHOL, HDL, LDLCALC, TRIG, CHOLHDL, LDLDIRECT in the last 72 hours. Thyroid Function Tests: No results for input(s): TSH, T4TOTAL, FREET4, T3FREE, THYROIDAB in the last 72 hours. Anemia Panel: No results for input(s): VITAMINB12, FOLATE, FERRITIN, TIBC, IRON, RETICCTPCT in the last 72 hours. Urine analysis:    Component Value Date/Time   COLORURINE STRAW (A) 09/19/2019 1549   APPEARANCEUR CLEAR (A) 09/19/2019 1549   LABSPEC 1.023 09/19/2019 1549   PHURINE 6.0 09/19/2019 1549   GLUCOSEU NEGATIVE 09/19/2019 1549   HGBUR NEGATIVE 09/19/2019 1549   BILIRUBINUR NEGATIVE 09/19/2019 1549   KETONESUR 5 (A) 09/19/2019 1549   PROTEINUR NEGATIVE 09/19/2019 1549   NITRITE NEGATIVE 09/19/2019 1549   LEUKOCYTESUR NEGATIVE 09/19/2019 1549   Sepsis Labs: @LABRCNTIP (procalcitonin:4,lacticidven:4) ) Recent Results (from the past 240 hour(s))  Respiratory Panel by RT PCR (Flu A&B, Covid) - Nasopharyngeal Swab     Status: None   Collection Time: 09/19/19   3:49 PM   Specimen: Nasopharyngeal Swab  Result Value Ref Range Status   SARS Coronavirus 2 by RT PCR NEGATIVE NEGATIVE Final    Comment: (NOTE) SARS-CoV-2 target nucleic acids are NOT DETECTED. The SARS-CoV-2 RNA is generally detectable in upper respiratoy specimens during the acute phase of infection. The lowest concentration of SARS-CoV-2 viral copies this assay can detect is 131 copies/mL. A negative result does not preclude SARS-Cov-2 infection and should not be used as the sole basis for treatment or other patient management decisions. A negative result may occur with  improper specimen collection/handling, submission of specimen other than nasopharyngeal swab, presence of viral mutation(s) within the areas targeted by this assay, and inadequate number of viral copies (<131 copies/mL). A negative result must be combined with clinical observations, patient history, and epidemiological information. The expected result is Negative. Fact Sheet for Patients:  PinkCheek.be Fact Sheet for Healthcare Providers:  GravelBags.it This test is not yet ap proved or cleared by the [place] FDA and  has been authorized for detection and/or diagnosis of SARS-CoV-2 by FDA under an Emergency Use Authorization (EUA). This EUA will remain  in effect (meaning this test can be used) for the duration of the COVID-19 declaration under Section 564(b)(1) of the Act, 21 U.S.C. section 360bbb-3(b)(1), unless the authorization is terminated or revoked sooner.    Influenza A by PCR NEGATIVE NEGATIVE Final   Influenza B by PCR NEGATIVE NEGATIVE Final    Comment: (NOTE) The Xpert Xpress SARS-CoV-2/FLU/RSV assay is intended as an aid in  the diagnosis of influenza from Nasopharyngeal swab specimens and  should not be used as a sole basis for treatment. Nasal washings and  aspirates are unacceptable for Xpert Xpress SARS-CoV-2/FLU/RSV   testing. Fact Sheet for Patients: PinkCheek.be Fact Sheet for Healthcare Providers: GravelBags.it This test is not yet approved or cleared by the [place] FDA and  has been authorized for detection and/or diagnosis of SARS-CoV-2 by  FDA under an Emergency Use Authorization (EUA). This EUA will remain  in effect (meaning this test can be used) for the duration of the  Covid-19 declaration under Section 564(b)(1) of the Act, 21  U.S.C. section 360bbb-3(b)(1), unless the authorization is  terminated or revoked. Performed at [facility], [address], [place], [place] [address]      Radiological Exams on Admission: CT ABDOMEN PELVIS W CONTRAST  Result Date: 09/19/2019 CLINICAL DATA:  Constipated for several days. Took a laxative yesterday. Five bloody stools today. History of diverticulitis. Abdominal crampy pain. EXAM: CT ABDOMEN AND PELVIS WITH CONTRAST TECHNIQUE: Multidetector CT imaging of the abdomen and pelvis was performed using the standard protocol following bolus administration of intravenous contrast. CONTRAST:  131mL OMNIPAQUE IOHEXOL 300 MG/ML  SOLN COMPARISON:  07/01/2019 FINDINGS: Lower chest: Clear lung bases.  Heart normal in size. Hepatobiliary: Liver normal in size. Overall decreased liver attenuation consistent with fatty infiltration. No liver mass or focal lesion. Normal gallbladder. No bile duct dilation. Pancreas: Unremarkable. No pancreatic ductal dilatation or surrounding inflammatory changes. Spleen: Normal in size without focal abnormality. Adrenals/Urinary Tract: Adrenal glands are unremarkable. Kidneys are normal, without renal calculi, focal lesion, or hydronephrosis. Bladder is unremarkable. Stomach/Bowel: Subtle inflammation adjacent to the proximal sigmoid colon, centered on an ill-defined diverticulum. No extraluminal air. No fluid collection to suggest an abscess. This portion of the  sigmoid colon shows mild wall thickening. There are numerous diverticula throughout the remainder of the colon, mostly on the left. No other evidence of colonic inflammation. Small to moderate hiatal hernia, stable. Stomach otherwise unremarkable. Normal small bowel. Normal appendix. Vascular/Lymphatic: Aortic atherosclerosis. No aneurysm. No enlarged lymph nodes. Reproductive: Enlarged prostate measuring 5.0 x 3.3 x 3.4 cm. Other: No abdominal wall hernia or abnormality. No abdominopelvic ascites. Musculoskeletal: No fracture or acute finding. No osteoblastic or osteolytic lesions. IMPRESSION: 1. Mild diverticulitis of the proximal sigmoid colon. No complication. Specifically, no evidence of extraluminal air or abscess. 2. No other acute abnormality within the abdomen or pelvis. 3. Hepatic steatosis. 4. Small to moderate hiatal hernia. 5. Aortic atherosclerosis. 6. Colonic diverticulosis. Electronically Signed   By: Lajean Manes M.D.   On: 09/19/2019 15:14     EKG: Independently reviewed.  Sinus rhythm, QTC 454, LAD, incomplete right bundle blockade, no ischemic change  Assessment/Plan Principal Problem:   Acute diverticulitis Active Problems:   Essential hypertension   GERD (gastroesophageal reflux disease)   Alcohol abuse   Acute diverticulitis: This is a recurrent issue.  CT scan showed mild diverticulitis of the proximal sigmoid colon, no complication, no evidence of extraluminal air or abscess.  Patient has a leukocytosis with WBC 12.5, but no fever.  Lactic acid is normal.  Clinically not septic.  Hemodynamically stable.  Hemoglobin 13.5 -- >13.0.  -Placed on MedSurg bed for observation -IV Zosyn (patient received 1 dose of Cipro and Flagyl in ED) - f/u Blood culture -As needed morphine for pain -As needed Zofran for nausea -IV fluid: 1 L normal saline, followed by 100 cc/h -check INR and PTT -f/u CBC q6h  HTN:  -Continue home medications: Lotrel -hydralazine prn  GERD  (gastroesophageal reflux disease): -Protonix  Alcohol abuse: -Did counseling about the importance of quitting drinking -CIWA protocol     DVT ppx: SCD Code Status: Full code Family Communication: not done, no family member is at bed side.   Disposition Plan:  Anticipate discharge back to previous home environment Consults called:  none Admission status: Med-surg bed for obs    Status is: Observation The patient remains OBS appropriate and will d/c before 2 midnights. Dispo: The patient is from: Home              Anticipated d/c is to: Home              Anticipated d/c date is: 1 day              Patient currently is not medically stable  to d/c.           Date of Service 09/19/2019    [facility]   If 7PM-7AM, please contact night-coverage [email or web address] 09/19/2019, 5:18 PM

## 2019-09-19 NOTE — ED Notes (Signed)
Hand off of care and report given to [place], RN

## 2019-09-20 DIAGNOSIS — Z87891 Personal history of nicotine dependence: Secondary | ICD-10-CM | POA: Diagnosis not present

## 2019-09-20 DIAGNOSIS — G250 Essential tremor: Secondary | ICD-10-CM | POA: Diagnosis present

## 2019-09-20 DIAGNOSIS — Z8711 Personal history of peptic ulcer disease: Secondary | ICD-10-CM | POA: Diagnosis not present

## 2019-09-20 DIAGNOSIS — K219 Gastro-esophageal reflux disease without esophagitis: Secondary | ICD-10-CM | POA: Diagnosis present

## 2019-09-20 DIAGNOSIS — F1010 Alcohol abuse, uncomplicated: Secondary | ICD-10-CM | POA: Diagnosis present

## 2019-09-20 DIAGNOSIS — I10 Essential (primary) hypertension: Secondary | ICD-10-CM | POA: Diagnosis present

## 2019-09-20 DIAGNOSIS — Z20822 Contact with and (suspected) exposure to covid-19: Secondary | ICD-10-CM | POA: Diagnosis present

## 2019-09-20 DIAGNOSIS — K921 Melena: Secondary | ICD-10-CM | POA: Diagnosis present

## 2019-09-20 DIAGNOSIS — K5792 Diverticulitis of intestine, part unspecified, without perforation or abscess without bleeding: Secondary | ICD-10-CM | POA: Diagnosis not present

## 2019-09-20 DIAGNOSIS — K5733 Diverticulitis of large intestine without perforation or abscess with bleeding: Secondary | ICD-10-CM | POA: Diagnosis present

## 2019-09-20 LAB — CBC
HCT: 30.1 % — ABNORMAL LOW (ref 39.0–52.0)
HCT: 28.6 % — ABNORMAL LOW (ref 39.0–52.0)
Hemoglobin: 10.3 g/dL — ABNORMAL LOW (ref 13.0–17.0)
Hemoglobin: 10.1 g/dL — ABNORMAL LOW (ref 13.0–17.0)
MCH: 32.5 pg (ref 26.0–34.0)
MCH: 32.6 pg (ref 26.0–34.0)
MCHC: 34.2 g/dL (ref 30.0–36.0)
MCHC: 35.3 g/dL (ref 30.0–36.0)
MCV: 95 fL (ref 80.0–100.0)
MCV: 92.3 fL (ref 80.0–100.0)
Platelets: 300 10*3/uL (ref 150–400)
Platelets: 316 10*3/uL (ref 150–400)
RBC: 3.17 MIL/uL — ABNORMAL LOW (ref 4.22–5.81)
RBC: 3.1 MIL/uL — ABNORMAL LOW (ref 4.22–5.81)
RDW: 13.6 % (ref 11.5–15.5)
RDW: 13.8 % (ref 11.5–15.5)
WBC: 11.6 10*3/uL — ABNORMAL HIGH (ref 4.0–10.5)
WBC: 15.1 10*3/uL — ABNORMAL HIGH (ref 4.0–10.5)
nRBC: 0 % (ref 0.0–0.2)
nRBC: 0 % (ref 0.0–0.2)

## 2019-09-20 LAB — HIV ANTIBODY (ROUTINE TESTING W REFLEX): HIV Screen 4th Generation wRfx: NONREACTIVE

## 2019-09-20 MED ORDER — ACETAMINOPHEN 325 MG PO TABS: 650.0000 mg | ORAL_TABLET | Freq: Four times a day (QID) | ORAL | Status: DC | PRN

## 2019-09-20 MED ORDER — MORPHINE SULFATE (PF) 2 MG/ML IV SOLN: 2.0000 mg | Freq: Four times a day (QID) | INTRAVENOUS | Status: DC | PRN

## 2019-09-20 MED ORDER — TRAMADOL HCL 50 MG PO TABS
50.0000 mg | ORAL_TABLET | Freq: Four times a day (QID) | ORAL | Status: DC | PRN
  Administered 2019-09-20 – 2019-09-21 (×2): 50 mg via ORAL
  Filled 2019-09-20 (×2): qty 1

## 2019-09-20 NOTE — Progress Notes (Signed)
[place] [facility] [place] NAME: Luis Porter    MR#:  [id number]  DATE OF BIRTH:  May 20, 1961  SUBJECTIVE:  patient came in with blood per rectum for 1 to 2 days. He continues to have small G.I. bleed starting around 2 AM yesterday.  c/o mild left lower quadrant pain. No fever.  REVIEW OF SYSTEMS:   Review of Systems  Constitutional: Negative for chills, fever and weight loss.  HENT: Negative for ear discharge, ear pain and nosebleeds.   Eyes: Negative for blurred vision, pain and discharge.  Respiratory: Negative for sputum production, shortness of breath, wheezing and stridor.   Cardiovascular: Negative for chest pain, palpitations, orthopnea and PND.  Gastrointestinal: Positive for abdominal pain and blood in stool. Negative for diarrhea, nausea and vomiting.  Genitourinary: Negative for frequency and urgency.  Musculoskeletal: Negative for back pain and joint pain.  Neurological: Negative for sensory change, speech change, focal weakness and weakness.  Psychiatric/Behavioral: Negative for depression and hallucinations. The patient is not nervous/anxious.    Tolerating Diet: Tolerating PT: not needed  DRUG ALLERGIES:   Allergies  Allergen Reactions  . Nsaids Other (See Comments)    Due to history of ulcers     VITALS:  Blood pressure 138/84, pulse (!) 101, temperature 98.6 F (37 C), temperature source Oral, resp. rate 20, height 5\' 6"  (1.676 m), weight 72.6 kg, SpO2 96 %.  PHYSICAL EXAMINATION:   Physical Exam  GENERAL:  59 y.o.-year-old patient lying in the bed with no acute distress.  EYES: Pupils equal, round, reactive to light and accommodation. No scleral icterus.   HEENT: Head atraumatic, normocephalic. Oropharynx and nasopharynx clear.  NECK:  Supple, no jugular venous distention. No thyroid enlargement, no tenderness.  LUNGS: Normal breath sounds bilaterally, no wheezing, rales, rhonchi. No use of accessory muscles of  respiration.  CARDIOVASCULAR: S1, S2 normal. No murmurs, rubs, or gallops.  ABDOMEN: Soft, nontender, nondistended. Bowel sounds present. No organomegaly or mass.  EXTREMITIES: No cyanosis, clubbing or edema b/l.    NEUROLOGIC: Cranial nerves II through XII are intact. No focal Motor or sensory deficits b/l.   PSYCHIATRIC:  patient is alert and oriented x 3.  SKIN: No obvious rash, lesion, or ulcer.   LABORATORY PANEL:  CBC Recent Labs  Lab 09/20/19 1043  WBC 15.1*  HGB 10.1*  HCT 28.6*  PLT 316    Chemistries  Recent Labs  Lab 09/19/19 1824  NA 135  K 3.4*  CL 103  CO2 22  GLUCOSE 209*  BUN 7  CREATININE 0.64  CALCIUM 8.4*  AST 30  ALT 24  ALKPHOS 46  BILITOT 0.7   Cardiac Enzymes No results for input(s): TROPONINI in the last 168 hours. RADIOLOGY:  CT ABDOMEN PELVIS W CONTRAST  Result Date: 09/19/2019 CLINICAL DATA:  Constipated for several days. Took a laxative yesterday. Five bloody stools today. History of diverticulitis. Abdominal crampy pain. EXAM: CT ABDOMEN AND PELVIS WITH CONTRAST TECHNIQUE: Multidetector CT imaging of the abdomen and pelvis was performed using the standard protocol following bolus administration of intravenous contrast. CONTRAST:  118mL OMNIPAQUE IOHEXOL 300 MG/ML  SOLN COMPARISON:  07/01/2019 FINDINGS: Lower chest: Clear lung bases.  Heart normal in size. Hepatobiliary: Liver normal in size. Overall decreased liver attenuation consistent with fatty infiltration. No liver mass or focal lesion. Normal gallbladder. No bile duct dilation. Pancreas: Unremarkable. No pancreatic ductal dilatation or surrounding inflammatory changes. Spleen: Normal in size without focal abnormality. Adrenals/Urinary Tract:  Adrenal glands are unremarkable. Kidneys are normal, without renal calculi, focal lesion, or hydronephrosis. Bladder is unremarkable. Stomach/Bowel: Subtle inflammation adjacent to the proximal sigmoid colon, centered on an ill-defined diverticulum. No  extraluminal air. No fluid collection to suggest an abscess. This portion of the sigmoid colon shows mild wall thickening. There are numerous diverticula throughout the remainder of the colon, mostly on the left. No other evidence of colonic inflammation. Small to moderate hiatal hernia, stable. Stomach otherwise unremarkable. Normal small bowel. Normal appendix. Vascular/Lymphatic: Aortic atherosclerosis. No aneurysm. No enlarged lymph nodes. Reproductive: Enlarged prostate measuring 5.0 x 3.3 x 3.4 cm. Other: No abdominal wall hernia or abnormality. No abdominopelvic ascites. Musculoskeletal: No fracture or acute finding. No osteoblastic or osteolytic lesions. IMPRESSION: 1. Mild diverticulitis of the proximal sigmoid colon. No complication. Specifically, no evidence of extraluminal air or abscess. 2. No other acute abnormality within the abdomen or pelvis. 3. Hepatic steatosis. 4. Small to moderate hiatal hernia. 5. Aortic atherosclerosis. 6. Colonic diverticulosis. Electronically Signed   By: Lajean Manes M.D.   On: 09/19/2019 15:14   ASSESSMENT AND PLAN:  Luis Porter is a 59 y.o. male with medical history significant of alcohol abuse, hypertension, GERD, gastric ulcer, diverticulitis, essential tremor, who presents with bloody stool and abdominal pain. Patient states that he has had 8 episodes of bloody stool bowel movement at home.   He has lower abdominal pain, which is sharp, constant, 5-7 out of 10 in severity, nonradiating.  Acute diverticulitis: This is a recurrent issue.   -CT scan showed mild diverticulitis of the proximal sigmoid colon, no complication, no evidence of extraluminal air or abscess.   -Patient has a leukocytosis with WBC 12.5, but no fever.   -Lactic acid is normal.  Clinically not septic.   -Hemodynamically stable.  Hemoglobin 13.5 -- >13.0> 10.3 -used to have small bloody stools. Staff recommended to document each bowel movement -change to full liquid diet -encourage po  fluids  -continue IV Zosyn -consider G.I. nuc med bleeding scan if patient is moderate to large blood he stools. -Consider G.I. consultation. -As needed morphine for pain -As needed Zofran for nausea -IV fluid: 1 L normal saline, followed by 100 cc/h-- now discontinue -last two hemoglobin check stable at 10.3.  HTN:  -Continue home medications: Lotrel -hydralazine prn  GERD (gastroesophageal reflux disease): -Protonix  Alcohol abuse: -Did counseling about the importance of quitting drinking -CIWA protocol   DVT ppx: SCD Code Status: Full code Family Communication: not done, no family member is at bed side.   Disposition Plan:  Anticipate discharge back to previous home environment Consults called:  none    Status is: inpatient -continues to have rectal bleed. May require nuc med/G.I. bleeding scan.  Dispo: The patient is from: Home  Anticipated d/c is to: Home  Anticipated d/c date is: 1-2 days  Patient currently is not medically stable to d/c      TOTAL TIME TAKING CARE OF THIS PATIENT: *35* minutes.  >50% time spent on counselling and coordination of care  Note: This dictation was prepared with Dragon dictation along with smaller phrase technology. Any transcriptional errors that result from this process are unintentional.  Fritzi Mandes M.D    [facility]   CC: Primary care physician; Olin Hauser, DOPatient ID: Luis Porter, male   DOB: 14-Dec-1960, 59 y.o.   MRN: [id number]

## 2019-09-20 NOTE — Consult Note (Signed)
Pharmacy Antibiotic Note  Luis Porter is a 59 y.o. male admitted on 09/19/2019 with Diverticulitis.  Pharmacy has been consulted for pip/tazo dosing.  Plan: Zosyn 3.375g IV q8h (4 hour infusion).  Height: 5\' 6"  (167.6 cm) Weight: 72.6 kg (160 lb) IBW/kg (Calculated) : 63.8  Temp (24hrs), Avg:98.1 F (36.7 C), Min:98.1 F (36.7 C), Max:98.2 F (36.8 C)  Recent Labs  Lab 09/19/19 1002 09/19/19 1404 09/19/19 1824 09/19/19 2251 09/20/19 0627  WBC 12.6* 12.5* 10.1 9.0 11.6*  CREATININE 0.57*  --  0.64  --   --   LATICACIDVEN  --  1.9  --   --   --     Estimated Creatinine Clearance: 91.9 mL/min (by C-G formula based on SCr of 0.64 mg/dL).    Allergies  Allergen Reactions  . Nsaids Other (See Comments)    Due to history of ulcers     Antimicrobials this admission: 4/22 cipro x 1   Dose adjustments this admission: None  Microbiology results: None  Thank you for allowing pharmacy to be a part of this patient's care.  Oswald Hillock, PharmD, BCPS 09/20/2019 9:55 AM

## 2019-09-21 MED ORDER — AMOXICILLIN-POT CLAVULANATE 875-125 MG PO TABS
1.0000 | ORAL_TABLET | Freq: Two times a day (BID) | ORAL | Status: DC
  Administered 2019-09-21: 1 via ORAL
  Filled 2019-09-21 (×2): qty 1

## 2019-09-21 MED ORDER — AMOXICILLIN-POT CLAVULANATE 875-125 MG PO TABS: 1.0000 | ORAL_TABLET | Freq: Two times a day (BID) | ORAL | 0 refills | Status: AC

## 2019-09-21 MED ORDER — SODIUM CHLORIDE 0.9 % IV SOLN
INTRAVENOUS | Status: DC | PRN
  Administered 2019-09-21: 250 mL via INTRAVENOUS

## 2019-09-21 NOTE — Discharge Instructions (Signed)
Advised abstain from drinking ETOH

## 2019-09-21 NOTE — Discharge Summary (Signed)
Luis Porter NAME: Luis Porter    MR#:  [id number]  DATE OF BIRTH:  11/22/1960  DATE OF ADMISSION:  09/19/2019 ADMITTING PHYSICIAN: Ivor Costa, MD  DATE OF DISCHARGE: 09/21/2019  PRIMARY CARE PHYSICIAN: Olin Hauser, DO    ADMISSION DIAGNOSIS:  Acute diverticulitis [K57.92]  DISCHARGE DIAGNOSIS:  Acute Sigmoid Diverticulitis  SECONDARY DIAGNOSIS:   Past Medical History:  Diagnosis Date  . Diverticulosis   . Gastric ulcer   . GERD (gastroesophageal reflux disease)   . History of hiatal hernia   . Hypertension   . 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 Patient states that he has had 8 episodes of bloody stool bowel movement at home. He has lower abdominal pain, which is sharp, constant, 5-7 out of 10 in severity, nonradiating.  Acute sigmoid diverticulitis: -CT scan showed mild diverticulitis of the proximal sigmoid colon, no complication, no evidence of extraluminal air or abscess. -Patient has a leukocytosis with WBC 12.5, but no fever.  -Lactic acid is normal. Clinically not septic.  -Hemodynamically stable.Hemoglobin 13.5 -- >13.0> 10.3 -pt has small BM--no blood in it per pt and staff -recommend cont  full liquid diet for few more days -encourage po fluids  -on  IV Zosyn ---change to po augmentin. Advised yoghurt -IV fluid: 1 L normal saline, followed by 100 cc/h-- now discontinue -last two hemoglobin check stable at 10.3. -cont fiber/stool softner prn and avoid constipation  HTN:  -Continue home medications:Lotrel -hydralazine prn  GERD (gastroesophageal reflux disease): -Protonix  Alcohol abuse: -Did counseling  about the importance of quitting drinking -no s/o WD   DVT ppx: SCD Code Status:Full code Family Communication: per pt his family is aware Disposition Plan: Anticipate discharge back to previous home environment Consults called:none  Overall improving. will d/c to home. F/u PCP in 1 week. Pt in agreement with plan. CONSULTS OBTAINED:    DRUG ALLERGIES:   Allergies  Allergen Reactions  . Nsaids Other (See Comments)    Due to history of ulcers     DISCHARGE MEDICATIONS:   Allergies as of 09/21/2019      Reactions   Nsaids Other (See Comments)   Due to history of ulcers      Medication List    STOP taking these medications   pantoprazole 40 MG tablet Commonly known as: Protonix     TAKE these medications   amLODipine-benazepril 10-40 MG capsule Commonly known as: LOTREL TAKE 1 CAPSULE BY MOUTH EVERY DAY   amoxicillin-clavulanate 875-125 MG tablet Commonly known as: AUGMENTIN Take 1 tablet by mouth every 12 (twelve) hours for 7 days.   omeprazole 40 MG capsule Commonly known as: PRILOSEC TAKE 1 CAPSULE BY MOUTH TWICE A DAY   ondansetron 4 MG disintegrating tablet Commonly known as: Zofran ODT Take 1 tablet (4 mg total) by mouth every 8 (eight) hours as needed for nausea or vomiting.   Vitamins/Minerals Tabs Take by mouth.       If you experience worsening of your admission symptoms, develop shortness of breath, life threatening emergency, suicidal or homicidal thoughts you must seek medical attention immediately by calling 911 or calling your MD immediately  if symptoms less severe.  You Must read complete instructions/literature along with all the possible  adverse reactions/side effects for all the Medicines you take and that have been prescribed to you. Take any new Medicines after you have completely understood and accept all the possible adverse reactions/side effects.   Please note  You were cared for by a hospitalist during your hospital  stay. If you have any questions about your discharge medications or the care you received while you were in the hospital after you are discharged, you can call the unit and asked to speak with the hospitalist on call if the hospitalist that took care of you is not available. Once you are discharged, your primary care physician will handle any further medical issues. Please note that NO REFILLS for any discharge medications will be authorized once you are discharged, as it is imperative that you return to your primary care physician (or establish a relationship with a primary care physician if you do not have one) for your aftercare needs so that they can reassess your need for medications and monitor your lab values. Today   SUBJECTIVE  I am feeling better. No blood in stool  VITAL SIGNS:  Blood pressure 118/82, pulse 87, temperature 98.4 F (36.9 C), temperature source Oral, resp. rate 18, height 5\' 6"  (1.676 m), weight 72.6 kg, SpO2 99 %.  I/O:    Intake/Output Summary (Last 24 hours) at 09/21/2019 0850 Last data filed at 09/21/2019 [id number] Gross per 24 hour  Intake 575.31 ml  Output --  Net 575.31 ml    PHYSICAL EXAMINATION:  GENERAL:  59 y.o.-year-old patient lying in the bed with no acute distress.  EYES: Pupils equal, round, reactive to light and accommodation. No scleral icterus.  HEENT: Head atraumatic, normocephalic. Oropharynx and nasopharynx clear.  NECK:  Supple, no jugular venous distention. No thyroid enlargement, no tenderness.  LUNGS: Normal breath sounds bilaterally, no wheezing, rales,rhonchi or crepitation. No use of accessory muscles of respiration.  CARDIOVASCULAR: S1, S2 normal. No murmurs, rubs, or gallops.  ABDOMEN: Soft, non-tender, non-distended. Bowel sounds present. No organomegaly or mass.  EXTREMITIES: No pedal edema, cyanosis, or clubbing.  NEUROLOGIC: Cranial nerves II through XII are intact. Muscle strength 5/5 in all extremities. Sensation intact. Gait not  checked.  PSYCHIATRIC: The patient is alert and oriented x 3.  SKIN: No obvious rash, lesion, or ulcer.   DATA REVIEW:   CBC  Recent Labs  Lab 09/20/19 1043  WBC 15.1*  HGB 10.1*  HCT 28.6*  PLT 316    Chemistries  Recent Labs  Lab 09/19/19 1824  NA 135  K 3.4*  CL 103  CO2 22  GLUCOSE 209*  BUN 7  CREATININE 0.64  CALCIUM 8.4*  AST 30  ALT 24  ALKPHOS 46  BILITOT 0.7    Microbiology Results   Recent Results (from the past 240 hour(s))  Respiratory Panel by RT PCR (Flu A&B, Covid) - Nasopharyngeal Swab     Status: None   Collection Time: 09/19/19  3:49 PM   Specimen: Nasopharyngeal Swab  Result Value Ref Range Status   SARS Coronavirus 2 by RT PCR NEGATIVE NEGATIVE Final    Comment: (NOTE) SARS-CoV-2 target nucleic acids are NOT DETECTED. The SARS-CoV-2 RNA is generally detectable in upper respiratoy specimens during the acute phase of infection. The lowest concentration of SARS-CoV-2 viral copies this assay can detect is 131 copies/mL. A negative result does not preclude SARS-Cov-2 infection and should not be used as the sole basis for treatment or other patient management decisions. A negative result may occur with  improper specimen collection/handling, submission of specimen other than nasopharyngeal swab, presence of viral mutation(s) within the areas targeted by this assay, and inadequate number of viral copies (<131 copies/mL). A negative result must be combined with clinical observations, patient history, and epidemiological information. The expected result is Negative. Fact Sheet for Patients:  PinkCheek.be Fact Sheet for Healthcare Providers:  GravelBags.it This test is not yet ap proved or cleared by the [place] FDA and  has been authorized for detection and/or diagnosis of SARS-CoV-2 by FDA under an Emergency Use Authorization (EUA). This EUA will remain  in effect (meaning this  test can be used) for the duration of the COVID-19 declaration under Section 564(b)(1) of the Act, 21 U.S.C. section 360bbb-3(b)(1), unless the authorization is terminated or revoked sooner.    Influenza A by PCR NEGATIVE NEGATIVE Final   Influenza B by PCR NEGATIVE NEGATIVE Final    Comment: (NOTE) The Xpert Xpress SARS-CoV-2/FLU/RSV assay is intended as an aid in  the diagnosis of influenza from Nasopharyngeal swab specimens and  should not be used as a sole basis for treatment. Nasal washings and  aspirates are unacceptable for Xpert Xpress SARS-CoV-2/FLU/RSV  testing. Fact Sheet for Patients: PinkCheek.be Fact Sheet for Healthcare Providers: GravelBags.it This test is not yet approved or cleared by the [place] FDA and  has been authorized for detection and/or diagnosis of SARS-CoV-2 by  FDA under an Emergency Use Authorization (EUA). This EUA will remain  in effect (meaning this test can be used) for the duration of the  Covid-19 declaration under Section 564(b)(1) of the Act, 21  U.S.C. section 360bbb-3(b)(1), unless the authorization is  terminated or revoked. Performed at [facility], [address] [place], [place] [address]   Culture, blood (Routine X 2) w Reflex to ID Panel     Status: None (Preliminary result)   Collection Time: 09/19/19  6:06 PM   Specimen: BLOOD  Result Value Ref Range Status   Specimen Description BLOOD RIGHT ANTECUBITAL  Final   Special Requests   Final    BOTTLES DRAWN AEROBIC AND ANAEROBIC Blood Culture adequate volume   Culture   Final    NO GROWTH 2 DAYS Performed at [facility], [address] [place], [place] [address]    Report Status PENDING  Incomplete  Culture, blood (Routine X 2) w Reflex to ID Panel     Status: None (Preliminary result)   Collection Time: 09/19/19  6:18 PM   Specimen: BLOOD  Result Value Ref Range Status   Specimen Description  BLOOD BLOOD RIGHT HAND  Final   Special Requests   Final    BOTTLES DRAWN AEROBIC AND ANAEROBIC Blood Culture adequate volume   Culture   Final    NO GROWTH 2 DAYS Performed at [facility], [address], [place], [place] [address]    Report Status PENDING  Incomplete    RADIOLOGY:  CT ABDOMEN PELVIS W CONTRAST  Result Date: 09/19/2019 CLINICAL DATA:  Constipated for several days. Took a laxative yesterday. Five bloody stools today. History of diverticulitis. Abdominal crampy pain. EXAM: CT ABDOMEN AND PELVIS WITH CONTRAST TECHNIQUE: Multidetector CT imaging of the abdomen and pelvis was performed using the standard protocol following bolus administration of intravenous contrast. CONTRAST:  123mL OMNIPAQUE IOHEXOL 300 MG/ML  SOLN COMPARISON:  07/01/2019 FINDINGS: Lower chest: Clear lung bases.  Heart normal in size. Hepatobiliary: Liver normal in size. Overall decreased liver attenuation consistent with fatty infiltration. No liver mass or  focal lesion. Normal gallbladder. No bile duct dilation. Pancreas: Unremarkable. No pancreatic ductal dilatation or surrounding inflammatory changes. Spleen: Normal in size without focal abnormality. Adrenals/Urinary Tract: Adrenal glands are unremarkable. Kidneys are normal, without renal calculi, focal lesion, or hydronephrosis. Bladder is unremarkable. Stomach/Bowel: Subtle inflammation adjacent to the proximal sigmoid colon, centered on an ill-defined diverticulum. No extraluminal air. No fluid collection to suggest an abscess. This portion of the sigmoid colon shows mild wall thickening. There are numerous diverticula throughout the remainder of the colon, mostly on the left. No other evidence of colonic inflammation. Small to moderate hiatal hernia, stable. Stomach otherwise unremarkable. Normal small bowel. Normal appendix. Vascular/Lymphatic: Aortic atherosclerosis. No aneurysm. No enlarged lymph nodes. Reproductive: Enlarged prostate measuring 5.0  x 3.3 x 3.4 cm. Other: No abdominal wall hernia or abnormality. No abdominopelvic ascites. Musculoskeletal: No fracture or acute finding. No osteoblastic or osteolytic lesions. IMPRESSION: 1. Mild diverticulitis of the proximal sigmoid colon. No complication. Specifically, no evidence of extraluminal air or abscess. 2. No other acute abnormality within the abdomen or pelvis. 3. Hepatic steatosis. 4. Small to moderate hiatal hernia. 5. Aortic atherosclerosis. 6. Colonic diverticulosis. Electronically Signed   By: Lajean Manes M.D.   On: 09/19/2019 15:14     CODE STATUS:     Code Status Orders  (From admission, onward)         Start     Ordered   09/19/19 1725  Full code  Continuous     09/19/19 1724        Code Status History    Date Active Date Inactive Code Status Order ID Comments User Context   05/18/2016 1405 05/19/2016 1714 Full Code [id number]  Blanche East, MD Inpatient   Advance Care Planning Activity       TOTAL TIME TAKING CARE OF THIS PATIENT: *40* minutes.    Fritzi Mandes M.D  [facility]    CC: Primary care physician; Olin Hauser, DO

## 2019-09-21 NOTE — Progress Notes (Signed)
Min Bauers to be D/C'd Home per MD order.  Discussed prescriptions and follow up appointments with the patient. Prescriptions given to patient, medication list explained in detail. Pt verbalized understanding.  Allergies as of 09/21/2019       Reactions   Nsaids Other (See Comments)   Due to history of ulcers        Medication List     STOP taking these medications    pantoprazole 40 MG tablet Commonly known as: Protonix       TAKE these medications    amLODipine-benazepril 10-40 MG capsule Commonly known as: LOTREL TAKE 1 CAPSULE BY MOUTH EVERY DAY   amoxicillin-clavulanate 875-125 MG tablet Commonly known as: AUGMENTIN Take 1 tablet by mouth every 12 (twelve) hours for 7 days.   omeprazole 40 MG capsule Commonly known as: PRILOSEC TAKE 1 CAPSULE BY MOUTH TWICE A DAY   ondansetron 4 MG disintegrating tablet Commonly known as: Zofran ODT Take 1 tablet (4 mg total) by mouth every 8 (eight) hours as needed for nausea or vomiting.   Vitamins/Minerals Tabs Take by mouth.        Vitals:   09/20/19 2032 09/21/19 0513  BP: 132/85 118/82  Pulse: 97 87  Resp: 18 18  Temp: 98.8 F (37.1 C) 98.4 F (36.9 C)  SpO2: 98% 99%    Skin clean, dry and intact without evidence of skin break down, no evidence of skin tears noted. IV catheter discontinued intact. Site without signs and symptoms of complications. Dressing and pressure applied. Pt denies pain at this time. No complaints noted.  An After Visit Summary was printed and given to the patient. Patient escorted via [place], and D/C home via private auto.  [place] A Marissa Lowrey

## 2019-09-24 ENCOUNTER — Telehealth: Payer: Self-pay

## 2019-09-24 LAB — CULTURE, BLOOD (ROUTINE X 2)
Culture: NO GROWTH
Culture: NO GROWTH
Special Requests: ADEQUATE
Special Requests: ADEQUATE

## 2019-09-24 NOTE — Telephone Encounter (Signed)
I have made 2 attempts to contact the patient or family member in charge, in order to follow up from recently being discharged from the hospital. I left a message on voicemail but I will make another attempt at a different time.   Left message with appt reminder.

## 2019-09-30 ENCOUNTER — Encounter: Payer: Self-pay | Admitting: Family Medicine

## 2019-09-30 ENCOUNTER — Other Ambulatory Visit: Payer: Self-pay

## 2019-09-30 ENCOUNTER — Ambulatory Visit (INDEPENDENT_AMBULATORY_CARE_PROVIDER_SITE_OTHER): Payer: Commercial Managed Care - PPO | Admitting: Family Medicine

## 2019-09-30 VITALS — BP 139/77 | HR 104 | Temp 97.8°F | Resp 16 | Ht 67.0 in | Wt 153.0 lb

## 2019-09-30 DIAGNOSIS — K625 Hemorrhage of anus and rectum: Secondary | ICD-10-CM

## 2019-09-30 DIAGNOSIS — N138 Other obstructive and reflux uropathy: Secondary | ICD-10-CM

## 2019-09-30 DIAGNOSIS — K5792 Diverticulitis of intestine, part unspecified, without perforation or abscess without bleeding: Secondary | ICD-10-CM | POA: Diagnosis not present

## 2019-09-30 DIAGNOSIS — D62 Acute posthemorrhagic anemia: Secondary | ICD-10-CM | POA: Diagnosis not present

## 2019-09-30 DIAGNOSIS — N401 Enlarged prostate with lower urinary tract symptoms: Secondary | ICD-10-CM

## 2019-09-30 DIAGNOSIS — K5730 Diverticulosis of large intestine without perforation or abscess without bleeding: Secondary | ICD-10-CM

## 2019-09-30 NOTE — Progress Notes (Signed)
Subjective:    Patient ID: Luis Porter, male    DOB: October 31, 1960, 59 y.o.   MRN: [id number]  Dannye Kenny Suhr is a 59 y.o. male presenting on 09/30/2019 for Hospitalization Follow-up (Acute diverticulitis )   HPI  HOSPITAL FOLLOW-UP VISIT  Hospital/Location: [place] Date of Admission: 09/19/19 Date of Discharge: 09/21/19 Transitions of care telephone call: Attempted x 2 on 09/24/19 [facility] LPN  Reason for Admission: Rectal bleeding blood in stool, abdominal pain Primary (+Secondary) Diagnosis: Acute Diverticulitis  - Hospital H&P and Discharge Summary have been reviewed - Patient presents today 9 days after recent hospitalization. Brief summary of recent course, patient had symptoms of abdominal pain, rectal bleeding, hospitalized, diagnosed w acute diverticulitis on CT, treated with antibiotics, IV fluid rehydration, escalated diet.  - Today reports overall has done well after discharge. Symptoms of rectal bleeding have resolved. And now still has some episodic mild to moderate abdominal pains more sharp episodes improving, but not back to normal yet. Admits loose stool diarrhea.  Previous colonoscopy 12/30/16 Dr Oletha [facility] - diverticulosis and x 3 polyps benign repeat in 3 years, approx 12/2019.  Denies any fever, nausea vomiting  - New medications on discharge: Augmentin BID x 7 days. Completed.  Additional complaint  BPH, with LUTS Reports new problem, identified on CT abdomen showed enlarged prostate, he was unaware of this problem previous, although it was on his medical record back in 2015. He is not on medicine for this. He admits some gradual changes more pronounced now but gradual worsening erectile dysfunction, weaker urinary stream some dripping/dribbling at times, nocturia  I have reviewed the discharge medication list, and have reconciled the current and discharge medications today.   Current Outpatient Medications:  .  amLODipine-benazepril  (LOTREL) 10-40 MG capsule, TAKE 1 CAPSULE BY MOUTH EVERY DAY, Disp: 90 capsule, Rfl: 1 .  omeprazole (PRILOSEC) 40 MG capsule, TAKE 1 CAPSULE BY MOUTH TWICE A DAY, Disp: 180 capsule, Rfl: 1 .  ondansetron (ZOFRAN ODT) 4 MG disintegrating tablet, Take 1 tablet (4 mg total) by mouth every 8 (eight) hours as needed for nausea or vomiting., Disp: 30 tablet, Rfl: 1 .  Vitamins/Minerals TABS, Take by mouth., Disp: , Rfl:   ------------------------------------------------------------------------- Social History   Tobacco Use  . Smoking status: Former Smoker    Packs/day: 2.00    Years: 25.00    Pack years: 50.00    Types: Cigarettes    Quit date: 05/31/1987    Years since quitting: 32.3  . Smokeless tobacco: Former Network engineer Use Topics  . Alcohol use: Yes    Comment: 8-10 beers per day  . Drug use: No    Review of Systems Per HPI unless specifically indicated above     Objective:    BP 139/77   Pulse (!) 104   Temp 97.8 F (36.6 C) (Temporal)   Resp 16   Ht 5\' 7"  (1.702 m)   Wt 153 lb (69.4 kg)   BMI 23.96 kg/m   Wt Readings from Last 3 Encounters:  09/30/19 153 lb (69.4 kg)  09/19/19 160 lb (72.6 kg)  07/01/19 170 lb (77.1 kg)    Physical Exam Vitals and nursing note reviewed.  Constitutional:      General: He is not in acute distress.    Appearance: He is well-developed. He is not diaphoretic.     Comments: Well-appearing, comfortable, cooperative  HENT:     Head: Normocephalic and atraumatic.  Eyes:  General:        Right eye: No discharge.        Left eye: No discharge.     Conjunctiva/sclera: Conjunctivae normal.  Neck:     Thyroid: No thyromegaly.  Cardiovascular:     Rate and Rhythm: Normal rate and regular rhythm.     Heart sounds: Normal heart sounds. No murmur.  Pulmonary:     Effort: Pulmonary effort is normal. No respiratory distress.     Breath sounds: Normal breath sounds. No wheezing or rales.  Abdominal:     General: Bowel sounds are  normal. There is no distension.     Palpations: Abdomen is soft. There is no mass.     Tenderness: There is no abdominal tenderness.  Musculoskeletal:        General: Normal range of motion.     Cervical back: Normal range of motion and neck supple.  Lymphadenopathy:     Cervical: No cervical adenopathy.  Skin:    General: Skin is warm and dry.     Findings: No erythema or rash.  Neurological:     Mental Status: He is alert and oriented to person, place, and time.  Psychiatric:        Behavior: Behavior normal.     Comments: Well groomed, good eye contact, normal speech and thoughts        CLINICAL DATA:  Constipated for several days. Took a laxative yesterday. Five bloody stools today. History of diverticulitis. Abdominal crampy pain.  EXAM: CT ABDOMEN AND PELVIS WITH CONTRAST  TECHNIQUE: Multidetector CT imaging of the abdomen and pelvis was performed using the standard protocol following bolus administration of intravenous contrast.  CONTRAST:  159mL OMNIPAQUE IOHEXOL 300 MG/ML  SOLN  COMPARISON:  07/01/2019  FINDINGS: Lower chest: Clear lung bases.  Heart normal in size.  Hepatobiliary: Liver normal in size. Overall decreased liver attenuation consistent with fatty infiltration. No liver mass or focal lesion. Normal gallbladder. No bile duct dilation.  Pancreas: Unremarkable. No pancreatic ductal dilatation or surrounding inflammatory changes.  Spleen: Normal in size without focal abnormality.  Adrenals/Urinary Tract: Adrenal glands are unremarkable. Kidneys are normal, without renal calculi, focal lesion, or hydronephrosis. Bladder is unremarkable.  Stomach/Bowel: Subtle inflammation adjacent to the proximal sigmoid colon, centered on an ill-defined diverticulum. No extraluminal air. No fluid collection to suggest an abscess. This portion of the sigmoid colon shows mild wall thickening.  There are numerous diverticula throughout the remainder  of the colon, mostly on the left. No other evidence of colonic inflammation.  Small to moderate hiatal hernia, stable. Stomach otherwise unremarkable. Normal small bowel. Normal appendix.  Vascular/Lymphatic: Aortic atherosclerosis. No aneurysm. No enlarged lymph nodes.  Reproductive: Enlarged prostate measuring 5.0 x 3.3 x 3.4 cm.  Other: No abdominal wall hernia or abnormality. No abdominopelvic ascites.  Musculoskeletal: No fracture or acute finding. No osteoblastic or osteolytic lesions.  IMPRESSION: 1. Mild diverticulitis of the proximal sigmoid colon. No complication. Specifically, no evidence of extraluminal air or abscess. 2. No other acute abnormality within the abdomen or pelvis. 3. Hepatic steatosis. 4. Small to moderate hiatal hernia. 5. Aortic atherosclerosis. 6. Colonic diverticulosis.   Electronically Signed   By: Lajean Manes M.D.   On: 09/19/2019 15:14  Results for orders placed or performed during the hospital encounter of 09/19/19  Respiratory Panel by RT PCR (Flu A&B, Covid) - Nasopharyngeal Swab   Specimen: Nasopharyngeal Swab  Result Value Ref Range   SARS Coronavirus 2 by RT PCR NEGATIVE  NEGATIVE   Influenza A by PCR NEGATIVE NEGATIVE   Influenza B by PCR NEGATIVE NEGATIVE  Culture, blood (Routine X 2) w Reflex to ID Panel   Specimen: BLOOD  Result Value Ref Range   Specimen Description BLOOD RIGHT ANTECUBITAL    Special Requests      BOTTLES DRAWN AEROBIC AND ANAEROBIC Blood Culture adequate volume   Culture      NO GROWTH 5 DAYS Performed at [facility], [address] [place], [place] [address]    Report Status 09/24/2019 FINAL   Culture, blood (Routine X 2) w Reflex to ID Panel   Specimen: BLOOD  Result Value Ref Range   Specimen Description BLOOD BLOOD RIGHT HAND    Special Requests      BOTTLES DRAWN AEROBIC AND ANAEROBIC Blood Culture adequate volume   Culture      NO GROWTH 5 DAYS Performed at [facility], [address] [place], [place] [address]    Report Status 09/24/2019 FINAL   Comprehensive metabolic panel  Result Value Ref Range   Sodium 134 (L) 135 - 145 mmol/L   Potassium 3.6 3.5 - 5.1 mmol/L   Chloride 100 98 - 111 mmol/L   CO2 23 22 - 32 mmol/L   Glucose, Bld 97 70 - 99 mg/dL   BUN 6 6 - 20 mg/dL   Creatinine, Ser 0.57 (L) 0.61 - 1.24 mg/dL   Calcium 8.6 (L) 8.9 - 10.3 mg/dL   Total Protein 7.4 6.5 - 8.1 g/dL   Albumin 3.9 3.5 - 5.0 g/dL   AST 37 15 - 41 U/L   ALT 29 0 - 44 U/L   Alkaline Phosphatase 55 38 - 126 U/L   Total Bilirubin 0.5 0.3 - 1.2 mg/dL   GFR calc non Af Amer >60 >60 mL/min   GFR calc Af Amer >60 >60 mL/min   Anion gap 11 5 - 15  CBC  Result Value Ref Range   WBC 12.6 (H) 4.0 - 10.5 K/uL   RBC 4.12 (L) 4.22 - 5.81 MIL/uL   Hemoglobin 13.5 13.0 - 17.0 g/dL   HCT 37.8 (L) 39.0 - 52.0 %   MCV 91.7 80.0 - 100.0 fL   MCH 32.8 26.0 - 34.0 pg   MCHC 35.7 30.0 - 36.0 g/dL   RDW 14.0 11.5 - 15.5 %   Platelets 351 150 - 400 K/uL   nRBC 0.0 0.0 - 0.2 %  Lipase, blood  Result Value Ref Range   Lipase 41 11 - 51 U/L  Lactic acid, plasma  Result Value Ref Range   Lactic Acid, Venous 1.9 0.5 - 1.9 mmol/L  Urinalysis, Complete w Microscopic  Result Value Ref Range   Color, Urine STRAW (A) YELLOW   APPearance CLEAR (A) CLEAR   Specific Gravity, Urine 1.023 1.005 - 1.030   pH 6.0 5.0 - 8.0   Glucose, UA NEGATIVE NEGATIVE mg/dL   Hgb urine dipstick NEGATIVE NEGATIVE   Bilirubin Urine NEGATIVE NEGATIVE   Ketones, ur 5 (A) NEGATIVE mg/dL   Protein, ur NEGATIVE NEGATIVE mg/dL   Nitrite NEGATIVE NEGATIVE   Leukocytes,Ua NEGATIVE NEGATIVE   RBC / HPF 0-5 0 - 5 RBC/hpf   WBC, UA NONE SEEN 0 - 5 WBC/hpf   Bacteria, UA NONE SEEN NONE SEEN   Squamous Epithelial / LPF NONE SEEN 0 - 5  CBC  Result Value Ref Range   WBC 12.5 (H) 4.0 - 10.5 K/uL   RBC 4.03 (L)  4.22 - 5.81 MIL/uL   Hemoglobin 13.0 13.0 - 17.0 g/dL   HCT 38.2 (L) 39.0 - 52.0 %    MCV 94.8 80.0 - 100.0 fL   MCH 32.3 26.0 - 34.0 pg   MCHC 34.0 30.0 - 36.0 g/dL   RDW 14.0 11.5 - 15.5 %   Platelets 380 150 - 400 K/uL   nRBC 0.0 0.0 - 0.2 %  Comprehensive metabolic panel  Result Value Ref Range   Sodium 135 135 - 145 mmol/L   Potassium 3.4 (L) 3.5 - 5.1 mmol/L   Chloride 103 98 - 111 mmol/L   CO2 22 22 - 32 mmol/L   Glucose, Bld 209 (H) 70 - 99 mg/dL   BUN 7 6 - 20 mg/dL   Creatinine, Ser 0.64 0.61 - 1.24 mg/dL   Calcium 8.4 (L) 8.9 - 10.3 mg/dL   Total Protein 6.3 (L) 6.5 - 8.1 g/dL   Albumin 3.5 3.5 - 5.0 g/dL   AST 30 15 - 41 U/L   ALT 24 0 - 44 U/L   Alkaline Phosphatase 46 38 - 126 U/L   Total Bilirubin 0.7 0.3 - 1.2 mg/dL   GFR calc non Af Amer >60 >60 mL/min   GFR calc Af Amer >60 >60 mL/min   Anion gap 10 5 - 15  CBC  Result Value Ref Range   WBC 10.1 4.0 - 10.5 K/uL   RBC 3.59 (L) 4.22 - 5.81 MIL/uL   Hemoglobin 11.6 (L) 13.0 - 17.0 g/dL   HCT 33.0 (L) 39.0 - 52.0 %   MCV 91.9 80.0 - 100.0 fL   MCH 32.3 26.0 - 34.0 pg   MCHC 35.2 30.0 - 36.0 g/dL   RDW 14.1 11.5 - 15.5 %   Platelets 339 150 - 400 K/uL   nRBC 0.0 0.0 - 0.2 %  CBC  Result Value Ref Range   WBC 9.0 4.0 - 10.5 K/uL   RBC 3.30 (L) 4.22 - 5.81 MIL/uL   Hemoglobin 10.8 (L) 13.0 - 17.0 g/dL   HCT 31.7 (L) 39.0 - 52.0 %   MCV 96.1 80.0 - 100.0 fL   MCH 32.7 26.0 - 34.0 pg   MCHC 34.1 30.0 - 36.0 g/dL   RDW 13.9 11.5 - 15.5 %   Platelets 318 150 - 400 K/uL   nRBC 0.0 0.0 - 0.2 %  CBC  Result Value Ref Range   WBC 11.6 (H) 4.0 - 10.5 K/uL   RBC 3.17 (L) 4.22 - 5.81 MIL/uL   Hemoglobin 10.3 (L) 13.0 - 17.0 g/dL   HCT 30.1 (L) 39.0 - 52.0 %   MCV 95.0 80.0 - 100.0 fL   MCH 32.5 26.0 - 34.0 pg   MCHC 34.2 30.0 - 36.0 g/dL   RDW 13.6 11.5 - 15.5 %   Platelets 300 150 - 400 K/uL   nRBC 0.0 0.0 - 0.2 %  APTT  Result Value Ref Range   aPTT 30 24 - 36 seconds  Protime-INR  Result Value Ref Range   Prothrombin Time 12.4 11.4 - 15.2 seconds   INR 0.9 0.8 - 1.2  HIV Antibody  (routine testing w rflx)  Result Value Ref Range   HIV Screen 4th Generation wRfx NON REACTIVE NON REACTIVE  CBC  Result Value Ref Range   WBC 15.1 (H) 4.0 - 10.5 K/uL   RBC 3.10 (L) 4.22 - 5.81 MIL/uL   Hemoglobin 10.1 (L) 13.0 - 17.0 g/dL   HCT 28.6 (L) 39.0 -  52.0 %   MCV 92.3 80.0 - 100.0 fL   MCH 32.6 26.0 - 34.0 pg   MCHC 35.3 30.0 - 36.0 g/dL   RDW 13.8 11.5 - 15.5 %   Platelets 316 150 - 400 K/uL   nRBC 0.0 0.0 - 0.2 %  Type and screen [place]  Result Value Ref Range   ABO/RH(D) A POS    Antibody Screen NEG    Sample Expiration      09/22/2019,2359 Performed at [facility], [address] [place], [place] [address]       Assessment & Plan:   Problem List Items Addressed This Visit    Diverticulosis of large intestine without hemorrhage   BPH with obstruction/lower urinary tract symptoms   Acute diverticulitis - Primary   Relevant Orders   Ambulatory referral to Gastroenterology   BASIC METABOLIC PANEL WITH GFR   CBC with Differential/Platelet    Other Visit Diagnoses    Rectal bleeding       Acute blood loss anemia       Relevant Orders   CBC with Differential/Platelet      #BPH Clinically with history of BPH LUTS, confirm enlarged prostate without other identified abnormality on CT scan recently in hospital for abdomen. Otherwise no prior diagnosis. Never on treatment before. Some bothersome LUTS seem worse now with bowel changes, not as hydrated, and also with antibiotics affecting him. - He will improve hydration - Try OTC Saw Palmetto herbal option, consider f/u sooner for rx alpha blocker or refer Urology or PDE5 inhib if indicated.  #Acute diverticulitis - RESOLVING #Acute Blood Loss anemia, secondary to diverticulitis / rectal bleeding - Resolving, no further bleeding - Check CBC, BMET today in follow-up from hospital, last trend Hgb to 10  Regarding diverticulitis, now has episodic pains and loose stools without  bleeding Finishd Augmentin antibiotic, has had some issue with lose stool after antibiotic Improving diet now , overall improved but not back to baseline yet at this time, still has bowel urgency and loose stools and occasional episodic abdominal pains  Awaiting follow-up outpatient now with GI again, referred to [facility] to reconsult w/ them on diverticulitis rectal bleeding and due for upcoming screening colonoscopy 12/2019 regardless or can do sooner as diagnostic. Up to GI.  Regarding missed time from work due to hospitalization, will complete paperwork once I receive it.  Bebe Liter First day of work missed 4/22 - hospitalized Discharged 4/24, but remained out of work due to symptoms Referral GI - further follow-up on this issue and may consider colonoscopy procedure now or in 12/2019 Anticipate return to work on 10/07/19, regular duty, unless worsening or new concern can follow-up sooner or w GI   No orders of the defined types were placed in this encounter.   Follow up plan: Return in about 3 months (around 12/31/2019) for 3 month BPH.   Nobie Putnam, [facility] 09/30/2019, 9:41 AM

## 2019-09-30 NOTE — Patient Instructions (Addendum)
Thank you for coming to the office today.  Try OTC Saw Palmetto 80-160mg  twice day for period of time as herbal remedy for enlarged prostate, difficult urination.  Improve fluid intake  Avoid caffeine and bladder irritants, alcohol  Future discussion can try Flomax rx or erectile dysfunction rx in future or we can consult Urologist  Referral to [facility] - Stay tuned for apt for colonoscopy / evaluation  Please schedule a Follow-up Appointment to: Return in about 3 months (around 12/31/2019) for 3 month BPH.  If you have any other questions or concerns, please feel free to call the office or send a message through [place]. You may also schedule an earlier appointment if necessary.  Additionally, you may be receiving a survey about your experience at our office within a few days to 1 week by e-mail or mail. We value your feedback.  Nobie Putnam, DO [place]

## 2019-10-01 LAB — CBC WITH DIFFERENTIAL/PLATELET
Absolute Monocytes: 637 cells/uL (ref 200–950)
Basophils Absolute: 210 cells/uL — ABNORMAL HIGH (ref 0–200)
Basophils Relative: 3 %
Eosinophils Absolute: 140 cells/uL (ref 15–500)
Eosinophils Relative: 2 %
HCT: 33 % — ABNORMAL LOW (ref 38.5–50.0)
Hemoglobin: 10.6 g/dL — ABNORMAL LOW (ref 13.2–17.1)
Lymphs Abs: 2121 cells/uL (ref 850–3900)
MCH: 31.3 pg (ref 27.0–33.0)
MCHC: 32.1 g/dL (ref 32.0–36.0)
MCV: 97.3 fL (ref 80.0–100.0)
MPV: 8.5 fL (ref 7.5–12.5)
Monocytes Relative: 9.1 %
Neutro Abs: 3892 cells/uL (ref 1500–7800)
Neutrophils Relative %: 55.6 %
Platelets: 542 10*3/uL — ABNORMAL HIGH (ref 140–400)
RBC: 3.39 10*6/uL — ABNORMAL LOW (ref 4.20–5.80)
RDW: 14 % (ref 11.0–15.0)
Total Lymphocyte: 30.3 %
WBC: 7 10*3/uL (ref 3.8–10.8)

## 2019-10-01 LAB — BASIC METABOLIC PANEL WITH GFR
BUN/Creatinine Ratio: 10 (calc) (ref 6–22)
BUN: 6 mg/dL — ABNORMAL LOW (ref 7–25)
CO2: 25 mmol/L (ref 20–32)
Calcium: 9.3 mg/dL (ref 8.6–10.3)
Chloride: 100 mmol/L (ref 98–110)
Creat: 0.61 mg/dL — ABNORMAL LOW (ref 0.70–1.33)
GFR, Est African American: 128 mL/min/{1.73_m2} (ref >=60)
GFR, Est Non African American: 111 mL/min/{1.73_m2} (ref >=60)
Glucose, Bld: 104 mg/dL — ABNORMAL HIGH (ref 65–99)
Potassium: 4.1 mmol/L (ref 3.5–5.3)
Sodium: 137 mmol/L (ref 135–146)

## 2019-10-02 ENCOUNTER — Ambulatory Visit (INDEPENDENT_AMBULATORY_CARE_PROVIDER_SITE_OTHER): Payer: Commercial Managed Care - PPO | Admitting: Family Medicine

## 2019-10-02 ENCOUNTER — Encounter: Payer: Self-pay | Admitting: Family Medicine

## 2019-10-02 ENCOUNTER — Other Ambulatory Visit: Payer: Self-pay

## 2019-10-02 ENCOUNTER — Ambulatory Visit: Payer: Self-pay | Admitting: *Deleted

## 2019-10-02 DIAGNOSIS — Z5329 Procedure and treatment not carried out because of patient's decision for other reasons: Secondary | ICD-10-CM

## 2019-10-02 DIAGNOSIS — Z91199 Patient's noncompliance with other medical treatment and regimen due to unspecified reason: Secondary | ICD-10-CM

## 2019-10-02 NOTE — Progress Notes (Signed)
Attempted to call around 230 on 10/02/19, left voicemail for him to call back. He was triaged by [facility] staff and advised that I would contact him back. No further contact today. Chart to be closed now, patient not seen.  Nobie Putnam, DO [facility] 10/02/2019, 5:29 PM

## 2019-10-02 NOTE — Telephone Encounter (Signed)
Patient is experiencing-dizziness and vomiting. 2 times today- patient is able to drink and is keeping down fliuds at this point.  Patient reports 2 episodes of severe dizziness today followed by severe vomiting. This has never happened before. Patient reports no other symptoms- although he did say he had slight cramping on LLQ- which he states is not the side of most recent diverticulosis. Patient also states his partner has been sent home from work for exposure to coworker with + COVID and is awaiting her test result from [facility] now. Patient is scheduled in office for evaluation of new dizziness- but appointment needs review in case PCP wants it to be virtual.  Reason for Disposition . [1] MODERATE dizziness (e.g., interferes with normal activities) AND [2] has NOT been evaluated by physician for this  (Exception: dizziness caused by heat exposure, sudden standing, or poor fluid intake)  Answer Assessment - Initial Assessment Questions 1. DESCRIPTION: "Describe your dizziness."     Quick- all the sudden 2. LIGHTHEADED: "Do you feel lightheaded?" (e.g., somewhat faint, woozy, weak upon standing)     Faint- lightheaded 3. VERTIGO: "Do you feel like either you or the room is spinning or tilting?" (i.e. vertigo)     no 4. SEVERITY: "How bad is it?"  "Do you feel like you are going to faint?" "Can you stand and walk?"   - MILD - walking normally   - MODERATE - interferes with normal activities (e.g., work, school)    - SEVERE - unable to stand, requires support to walk, feels like passing out now.      moderate 5. ONSET:  "When did the dizziness begin?"     today 6. AGGRAVATING FACTORS: "Does anything make it worse?" (e.g., standing, change in head position)     Not sure 7. HEART RATE: "Can you tell me your heart rate?" "How many beats in 15 seconds?"  (Note: not all patients can do this)       Not check 8. CAUSE: "What do you think is causing the dizziness?"     Not sure 9. RECURRENT SYMPTOM:  "Have you had dizziness before?" If so, ask: "When was the last time?" "What happened that time?"     no 10. OTHER SYMPTOMS: "Do you have any other symptoms?" (e.g., fever, chest pain, vomiting, diarrhea, bleeding)       Vomiting- with dizziness 11. PREGNANCY: "Is there any chance you are pregnant?" "When was your last menstrual period?"       n/a  Protocols used: DIZZINESS [facility]

## 2019-10-03 ENCOUNTER — Other Ambulatory Visit: Payer: Self-pay

## 2019-10-03 ENCOUNTER — Telehealth (INDEPENDENT_AMBULATORY_CARE_PROVIDER_SITE_OTHER): Payer: Commercial Managed Care - PPO | Admitting: Family Medicine

## 2019-10-03 ENCOUNTER — Encounter: Payer: Self-pay | Admitting: Family Medicine

## 2019-10-03 DIAGNOSIS — R110 Nausea: Secondary | ICD-10-CM

## 2019-10-03 DIAGNOSIS — R112 Nausea with vomiting, unspecified: Secondary | ICD-10-CM | POA: Diagnosis not present

## 2019-10-03 DIAGNOSIS — R42 Dizziness and giddiness: Secondary | ICD-10-CM

## 2019-10-03 MED ORDER — MECLIZINE HCL 25 MG PO TABS: 25.0000 mg | ORAL_TABLET | Freq: Three times a day (TID) | ORAL | 0 refills | Status: DC | PRN

## 2019-10-03 MED ORDER — ONDANSETRON 8 MG PO TBDP: 8.0000 mg | ORAL_TABLET | Freq: Three times a day (TID) | ORAL | 1 refills | Status: DC | PRN

## 2019-10-03 MED ORDER — PROMETHAZINE HCL 25 MG PO TABS: 12.5000 mg | ORAL_TABLET | Freq: Three times a day (TID) | ORAL | 0 refills | Status: DC | PRN

## 2019-10-03 NOTE — Progress Notes (Signed)
Virtual Visit via Telephone The purpose of this virtual visit is to provide medical care while limiting exposure to the novel coronavirus (COVID19) for both patient and office staff.  Consent was obtained for phone visit:  Yes.   Answered questions that patient had about telehealth interaction:  Yes.   I discussed the limitations, risks, security and privacy concerns of performing an evaluation and management service by telephone. I also discussed with the patient that there may be a patient responsible charge related to this service. The patient expressed understanding and agreed to proceed.  Patient Location: Home Provider Location: [facility])  ---------------------------------------------------------------------- Chief Complaint  Patient presents with  . Dizziness    throwing up     S: Reviewed CMA documentation. 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 Recent history was hospitalized 08/2019 for acute diverticulitis, see last note from me on 09/30/19 office visit he was improving at that time. Had labs done showed improved Hemoglobin iron level Now worsening again over past 2+ days feeling nausea vomiting, dizzy and weak at times. Trying to keep food and liquids down. Drinking water, gatorade. Taking Zofran 4mg  ODT 1-2 per dose PRN limited results so far but today feels somewhat better, yesterday was worse. No significant abdominal pain or rectal bleeding. Denies fever. Says contact at home wife had possible COVID19 exposure he is awaiting results there. He is currently still on short term leave from previous hospitalization was anticipated return to work 5/10 but he does not feel ready to return at this time due to weakness nausea vomiting. Admits some dizziness  but not vertigo  Denies any fevers, chills, sweats, body ache, cough, shortness of breath, sinus pain or pressure, headache, abdominal pain, diarrhea  Past Medical History:  Diagnosis Date  . Diverticulosis   . Gastric ulcer   . GERD (gastroesophageal reflux disease)   . History of hiatal hernia   . Hypertension   . 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 Smoking status: Former Smoker    Packs/day: 2.00    Years: 25.00    Pack years: 50.00    Types: Cigarettes    Quit date: 05/31/1987    Years since quitting: 32.3  . Smokeless tobacco: Former Network engineer Use Topics  . Alcohol use: Yes    Comment: 8-10 beers per day  . Drug use: No    Current Outpatient Medications:  .  amLODipine-benazepril (LOTREL) 10-40 MG capsule, TAKE 1 CAPSULE BY MOUTH EVERY DAY, Disp: 90 capsule, Rfl: 1 .  omeprazole (PRILOSEC) 40 MG capsule, TAKE 1 CAPSULE BY MOUTH TWICE A DAY, Disp: 180 capsule, Rfl: 1 .  ondansetron (ZOFRAN ODT) 8 MG disintegrating tablet, Take 1 tablet (8 mg total) by mouth every 8 (eight) hours as needed for nausea or vomiting., Disp: 30 tablet, Rfl: 1 .  Vitamins/Minerals TABS, Take by mouth., Disp: , Rfl:  .  meclizine (ANTIVERT) 25 MG tablet, Take 1 tablet (25 mg total) by mouth 3 (three) times daily as needed for dizziness or nausea., Disp: 30 tablet, Rfl: 0 .  promethazine (PHENERGAN) 25 MG tablet, Take 0.5-1 tablets (12.5-25 mg total) by mouth every 8 (eight) hours as needed for nausea or vomiting., Disp:  30 tablet, Rfl: 0  Depression screen [facility] 2/9 09/30/2019 11/20/2018 11/08/2016  Decreased Interest 0 0 0  Down, Depressed, Hopeless 0 0 0  PHQ - 2 Score 0 0 0  Altered sleeping - - 0  Tired, decreased energy - - 1  Change in appetite - - 0  Feeling bad or failure about yourself  - - 0  Trouble concentrating - - 0  Moving slowly or fidgety/restless - - 0  Suicidal thoughts - - 0  PHQ-9 Score - -  1    No flowsheet data found.  -------------------------------------------------------------------------- O: No physical exam performed due to remote telephone encounter.  Lab results reviewed.  Recent Results (from the past 2160 hour(s))  Comprehensive metabolic panel     Status: Abnormal   Collection Time: 09/19/19 10:02 AM  Result Value Ref Range   Sodium 134 (L) 135 - 145 mmol/L   Potassium 3.6 3.5 - 5.1 mmol/L   Chloride 100 98 - 111 mmol/L   CO2 23 22 - 32 mmol/L   Glucose, Bld 97 70 - 99 mg/dL    Comment: Glucose reference range applies only to samples taken after fasting for at least 8 hours.   BUN 6 6 - 20 mg/dL   Creatinine, Ser 0.57 (L) 0.61 - 1.24 mg/dL   Calcium 8.6 (L) 8.9 - 10.3 mg/dL   Total Protein 7.4 6.5 - 8.1 g/dL   Albumin 3.9 3.5 - 5.0 g/dL   AST 37 15 - 41 U/L   ALT 29 0 - 44 U/L   Alkaline Phosphatase 55 38 - 126 U/L   Total Bilirubin 0.5 0.3 - 1.2 mg/dL   GFR calc non Af Amer >60 >60 mL/min   GFR calc Af Amer >60 >60 mL/min   Anion gap 11 5 - 15    Comment: Performed at [facility], [address] [place], [place] [address]  CBC     Status: Abnormal   Collection Time: 09/19/19 10:02 AM  Result Value Ref Range   WBC 12.6 (H) 4.0 - 10.5 K/uL   RBC 4.12 (L) 4.22 - 5.81 MIL/uL   Hemoglobin 13.5 13.0 - 17.0 g/dL   HCT 37.8 (L) 39.0 - 52.0 %   MCV 91.7 80.0 - 100.0 fL   MCH 32.8 26.0 - 34.0 pg   MCHC 35.7 30.0 - 36.0 g/dL   RDW 14.0 11.5 - 15.5 %   Platelets 351 150 - 400 K/uL   nRBC 0.0 0.0 - 0.2 %    Comment: Performed at [facility], [address] [place], [place] [address]  Type and screen [place]     Status: None   Collection Time: 09/19/19 10:02 AM  Result Value Ref Range   ABO/RH(D) A POS    Antibody Screen NEG    Sample Expiration      09/22/2019,2359 Performed at [facility], [address] [place], [place] [address]   Lipase, blood     Status: None   Collection Time:  09/19/19 10:02 AM  Result Value Ref Range   Lipase 41 11 - 51 U/L    Comment: Performed at [facility], [address] [place], [place] [address]  Lactic acid, plasma     Status: None   Collection Time: 09/19/19  2:04 PM  Result Value Ref Range   Lactic Acid, Venous 1.9 0.5 - 1.9 mmol/L    Comment: Performed at [facility], [address] [place], [place]  [address]  CBC     Status: Abnormal   Collection Time: 09/19/19  2:04 PM  Result Value Ref Range   WBC 12.5 (H) 4.0 - 10.5 K/uL   RBC 4.03 (L) 4.22 - 5.81 MIL/uL   Hemoglobin 13.0 13.0 - 17.0 g/dL   HCT 38.2 (L) 39.0 - 52.0 %   MCV 94.8 80.0 - 100.0 fL   MCH 32.3 26.0 - 34.0 pg   MCHC 34.0 30.0 - 36.0 g/dL   RDW 14.0 11.5 - 15.5 %   Platelets 380 150 - 400 K/uL   nRBC 0.0 0.0 - 0.2 %    Comment: Performed at [facility], [address] [place], [place] [address]  Urinalysis, Complete w Microscopic     Status: Abnormal   Collection Time: 09/19/19  3:49 PM  Result Value Ref Range   Color, Urine STRAW (A) YELLOW   APPearance CLEAR (A) CLEAR   Specific Gravity, Urine 1.023 1.005 - 1.030   pH 6.0 5.0 - 8.0   Glucose, UA NEGATIVE NEGATIVE mg/dL   Hgb urine dipstick NEGATIVE NEGATIVE   Bilirubin Urine NEGATIVE NEGATIVE   Ketones, ur 5 (A) NEGATIVE mg/dL   Protein, ur NEGATIVE NEGATIVE mg/dL   Nitrite NEGATIVE NEGATIVE   Leukocytes,Ua NEGATIVE NEGATIVE   RBC / HPF 0-5 0 - 5 RBC/hpf   WBC, UA NONE SEEN 0 - 5 WBC/hpf   Bacteria, UA NONE SEEN NONE SEEN   Squamous Epithelial / LPF NONE SEEN 0 - 5    Comment: Performed at [facility], [address], [place], [place] [address]  Respiratory Panel by RT PCR (Flu A&B, Covid) - Nasopharyngeal Swab     Status: None   Collection Time: 09/19/19  3:49 PM   Specimen: Nasopharyngeal Swab  Result Value Ref Range   SARS Coronavirus 2 by RT PCR NEGATIVE NEGATIVE    Comment: (NOTE) SARS-CoV-2 target nucleic acids are NOT DETECTED. The SARS-CoV-2  RNA is generally detectable in upper respiratoy specimens during the acute phase of infection. The lowest concentration of SARS-CoV-2 viral copies this assay can detect is 131 copies/mL. A negative result does not preclude SARS-Cov-2 infection and should not be used as the sole basis for treatment or other patient management decisions. A negative result may occur with  improper specimen collection/handling, submission of specimen other than nasopharyngeal swab, presence of viral mutation(s) within the areas targeted by this assay, and inadequate number of viral copies (<131 copies/mL). A negative result must be combined with clinical observations, patient history, and epidemiological information. The expected result is Negative. Fact Sheet for Patients:  PinkCheek.be Fact Sheet for Healthcare Providers:  GravelBags.it This test is not yet ap proved or cleared by the [place] FDA and  has been authorized for detection and/or diagnosis of SARS-CoV-2 by FDA under an Emergency Use Authorization (EUA). This EUA will remain  in effect (meaning this test can be used) for the duration of the COVID-19 declaration under Section 564(b)(1) of the Act, 21 U.S.C. section 360bbb-3(b)(1), unless the authorization is terminated or revoked sooner.    Influenza A by PCR NEGATIVE NEGATIVE   Influenza B by PCR NEGATIVE NEGATIVE    Comment: (NOTE) The Xpert Xpress SARS-CoV-2/FLU/RSV assay is intended as an aid in  the diagnosis of influenza from Nasopharyngeal swab specimens and  should not be used as a sole basis for treatment. Nasal washings and  aspirates are unacceptable for Xpert Xpress SARS-CoV-2/FLU/RSV  testing. Fact Sheet for Patients: PinkCheek.be Fact Sheet  for Healthcare Providers: GravelBags.it This test is not yet approved or cleared by the [place] and  has  been authorized for detection and/or diagnosis of SARS-CoV-2 by  FDA under an Emergency Use Authorization (EUA). This EUA will remain  in effect (meaning this test can be used) for the duration of the  Covid-19 declaration under Section 564(b)(1) of the Act, 21  U.S.C. section 360bbb-3(b)(1), unless the authorization is  terminated or revoked. Performed at [facility], [address] [place], [place] [address]   Culture, blood (Routine X 2) w Reflex to ID Panel     Status: None   Collection Time: 09/19/19  6:06 PM   Specimen: BLOOD  Result Value Ref Range   Specimen Description BLOOD RIGHT ANTECUBITAL    Special Requests      BOTTLES DRAWN AEROBIC AND ANAEROBIC Blood Culture adequate volume   Culture      NO GROWTH 5 DAYS Performed at [facility], [address], [place], [place] [address]    Report Status 09/24/2019 FINAL   Culture, blood (Routine X 2) w Reflex to ID Panel     Status: None   Collection Time: 09/19/19  6:18 PM   Specimen: BLOOD  Result Value Ref Range   Specimen Description BLOOD BLOOD RIGHT HAND    Special Requests      BOTTLES DRAWN AEROBIC AND ANAEROBIC Blood Culture adequate volume   Culture      NO GROWTH 5 DAYS Performed at [facility], [address] [place], [place] [address]    Report Status 09/24/2019 FINAL   APTT     Status: None   Collection Time: 09/19/19  6:18 PM  Result Value Ref Range   aPTT 30 24 - 36 seconds    Comment: Performed at [facility], [address] [place], [place] [address]  Protime-INR     Status: None   Collection Time: 09/19/19  6:18 PM  Result Value Ref Range   Prothrombin Time 12.4 11.4 - 15.2 seconds   INR 0.9 0.8 - 1.2    Comment: (NOTE) INR goal varies based on device and disease states. Performed at [facility], [address] [place], [place] [address]   HIV Antibody (routine testing w rflx)     Status: None   Collection Time: 09/19/19  6:18 PM  Result  Value Ref Range   HIV Screen 4th Generation wRfx NON REACTIVE NON REACTIVE    Comment: Performed at [facility], [place] [address] [place], [place] [address]  Comprehensive metabolic panel     Status: Abnormal   Collection Time: 09/19/19  6:24 PM  Result Value Ref Range   Sodium 135 135 - 145 mmol/L   Potassium 3.4 (L) 3.5 - 5.1 mmol/L   Chloride 103 98 - 111 mmol/L   CO2 22 22 - 32 mmol/L   Glucose, Bld 209 (H) 70 - 99 mg/dL    Comment: Glucose reference range applies only to samples taken after fasting for at least 8 hours.   BUN 7 6 - 20 mg/dL   Creatinine, Ser 0.64 0.61 - 1.24 mg/dL   Calcium 8.4 (L) 8.9 - 10.3 mg/dL   Total Protein 6.3 (L) 6.5 - 8.1 g/dL   Albumin 3.5 3.5 - 5.0 g/dL   AST 30 15 - 41 U/L   ALT 24 0 - 44 U/L   Alkaline Phosphatase 46 38 - 126 U/L   Total Bilirubin 0.7 0.3 -  1.2 mg/dL   GFR calc non Af Amer >60 >60 mL/min   GFR calc Af Amer >60 >60 mL/min   Anion gap 10 5 - 15    Comment: Performed at [facility], [address] [place], [place] [address]  CBC     Status: Abnormal   Collection Time: 09/19/19  6:24 PM  Result Value Ref Range   WBC 10.1 4.0 - 10.5 K/uL   RBC 3.59 (L) 4.22 - 5.81 MIL/uL   Hemoglobin 11.6 (L) 13.0 - 17.0 g/dL   HCT 33.0 (L) 39.0 - 52.0 %   MCV 91.9 80.0 - 100.0 fL   MCH 32.3 26.0 - 34.0 pg   MCHC 35.2 30.0 - 36.0 g/dL   RDW 14.1 11.5 - 15.5 %   Platelets 339 150 - 400 K/uL   nRBC 0.0 0.0 - 0.2 %    Comment: Performed at [facility], [address] [place], [place] [address]  CBC     Status: Abnormal   Collection Time: 09/19/19 10:51 PM  Result Value Ref Range   WBC 9.0 4.0 - 10.5 K/uL   RBC 3.30 (L) 4.22 - 5.81 MIL/uL   Hemoglobin 10.8 (L) 13.0 - 17.0 g/dL   HCT 31.7 (L) 39.0 - 52.0 %   MCV 96.1 80.0 - 100.0 fL   MCH 32.7 26.0 - 34.0 pg   MCHC 34.1 30.0 - 36.0 g/dL   RDW 13.9 11.5 - 15.5 %   Platelets 318 150 - 400 K/uL   nRBC 0.0 0.0 - 0.2 %    Comment: Performed at [facility], [address] [place], [place] [address]  CBC     Status: Abnormal   Collection Time: 09/20/19  6:27 AM  Result Value Ref Range   WBC 11.6 (H) 4.0 - 10.5 K/uL   RBC 3.17 (L) 4.22 - 5.81 MIL/uL   Hemoglobin 10.3 (L) 13.0 - 17.0 g/dL   HCT 30.1 (L) 39.0 - 52.0 %   MCV 95.0 80.0 - 100.0 fL   MCH 32.5 26.0 - 34.0 pg   MCHC 34.2 30.0 - 36.0 g/dL   RDW 13.6 11.5 - 15.5 %   Platelets 300 150 - 400 K/uL   nRBC 0.0 0.0 - 0.2 %    Comment: Performed at [facility], [address] [place], [place] [address]  CBC     Status: Abnormal   Collection Time: 09/20/19 10:43 AM  Result Value Ref Range   WBC 15.1 (H) 4.0 - 10.5 K/uL   RBC 3.10 (L) 4.22 - 5.81 MIL/uL   Hemoglobin 10.1 (L) 13.0 - 17.0 g/dL   HCT 28.6 (L) 39.0 - 52.0 %   MCV 92.3 80.0 - 100.0 fL   MCH 32.6 26.0 - 34.0 pg   MCHC 35.3 30.0 - 36.0 g/dL   RDW 13.8 11.5 - 15.5 %   Platelets 316 150 - 400 K/uL   nRBC 0.0 0.0 - 0.2 %    Comment: Performed at [facility], [address] [place], [place] [id number]  BASIC METABOLIC PANEL WITH GFR     Status: Abnormal   Collection Time: 09/30/19 10:08 AM  Result Value Ref Range   Glucose, Bld 104 (H) 65 - 99 mg/dL    Comment: .            Fasting reference interval . For someone without known diabetes, a glucose value between 100 and 125 mg/dL is consistent with prediabetes and should be  confirmed with a follow-up test. .    BUN 6 (L) 7 - 25 mg/dL   Creat 0.61 (L) 0.70 - 1.33 mg/dL    Comment: For patients >34 years of age, the reference limit for Creatinine is approximately 13% higher for people identified as African-American. .    GFR, Est Non African American 111 > OR = 60 mL/min/1.11m2   GFR, Est African American 128 > OR = 60 mL/min/1.38m2   BUN/Creatinine Ratio 10 6 - 22 (calc)   Sodium 137 135 - 146 mmol/L   Potassium 4.1 3.5 - 5.3 mmol/L   Chloride 100 98 - 110 mmol/L   CO2 25 20 - 32 mmol/L   Calcium 9.3 8.6 - 10.3 mg/dL  CBC with  Differential/Platelet     Status: Abnormal   Collection Time: 09/30/19 10:08 AM  Result Value Ref Range   WBC 7.0 3.8 - 10.8 Thousand/uL   RBC 3.39 (L) 4.20 - 5.80 Million/uL   Hemoglobin 10.6 (L) 13.2 - 17.1 g/dL   HCT 33.0 (L) 38.5 - 50.0 %   MCV 97.3 80.0 - 100.0 fL   MCH 31.3 27.0 - 33.0 pg   MCHC 32.1 32.0 - 36.0 g/dL   RDW 14.0 11.0 - 15.0 %   Platelets 542 (H) 140 - 400 Thousand/uL   MPV 8.5 7.5 - 12.5 fL   Neutro Abs 3,892 1,500 - 7,800 cells/uL   Lymphs Abs 2,121 850 - 3,900 cells/uL   Absolute Monocytes 637 200 - 950 cells/uL   Eosinophils Absolute 140 15 - 500 cells/uL   Basophils Absolute 210 (H) 0 - 200 cells/uL   Neutrophils Relative % 55.6 %   Total Lymphocyte 30.3 %   Monocytes Relative 9.1 %   Eosinophils Relative 2.0 %   Basophils Relative 3.0 %    -------------------------------------------------------------------------- A&P:  Problem List Items Addressed This Visit    None    Visit Diagnoses    Nausea    -  Primary   Relevant Medications   ondansetron (ZOFRAN ODT) 8 MG disintegrating tablet   promethazine (PHENERGAN) 25 MG tablet   Non-intractable vomiting with nausea, unspecified vomiting type       Dizziness       Relevant Medications   meclizine (ANTIVERT) 25 MG tablet     Slightly improved but persistent nausea vomiting dizziness, with some GI losses nausea vomiting without hematemesis, no diarrhea or hematochezia. - Reviewed labs done 5/3, patient aware of results, improved Hgb, normal electrolytes, reassuring  Concerning now with persistent GI symptoms following recent hospitalization, uncertain if some level of recurrence or constellation of symptoms - he has not recovered yet and not ready to return to work based on active nausea vomiting dizziness weakness symptoms. Unable to perform job.  Re order Zofran ODT at 8mg  now PRN Re order Phenergan 12.5-25mg  PRN nausea as well - worked last time when he took both Trial on Meclizine PRN dizziness  if needed  Improve hydration as discussed, rehydration strategies, gradual improve diet  Remain out of work likely 1 more week, anticipated return 10/14/19  Additionally already has referral in place for [facility] - to return to Dr Vicente Males at [facility].   Meds ordered this encounter  Medications  . ondansetron (ZOFRAN ODT) 8 MG disintegrating tablet    Sig: Take 1 tablet (8 mg total) by mouth every 8 (eight) hours as needed for nausea or vomiting.    Dispense:  30 tablet    Refill:  1  .  promethazine (PHENERGAN) 25 MG tablet    Sig: Take 0.5-1 tablets (12.5-25 mg total) by mouth every 8 (eight) hours as needed for nausea or vomiting.    Dispense:  30 tablet    Refill:  0  . meclizine (ANTIVERT) 25 MG tablet    Sig: Take 1 tablet (25 mg total) by mouth 3 (three) times daily as needed for dizziness or nausea.    Dispense:  30 tablet    Refill:  0    Follow-up: - Return in 1 week or less if needed  Patient verbalizes understanding with the above medical recommendations including the limitation of remote medical advice.  Specific follow-up and call-back criteria were given for patient to follow-up or seek medical care more urgently if needed.   - Time spent in direct consultation with patient on phone: 9 minutes   Nobie Putnam, [facility] 10/03/2019, 10:45 AM

## 2019-10-07 ENCOUNTER — Ambulatory Visit (INDEPENDENT_AMBULATORY_CARE_PROVIDER_SITE_OTHER): Payer: Commercial Managed Care - PPO | Admitting: Family Medicine

## 2019-10-07 ENCOUNTER — Encounter: Payer: Self-pay | Admitting: Family Medicine

## 2019-10-07 ENCOUNTER — Telehealth: Payer: Self-pay | Admitting: Gastroenterology

## 2019-10-07 ENCOUNTER — Other Ambulatory Visit: Payer: Self-pay

## 2019-10-07 DIAGNOSIS — R109 Unspecified abdominal pain: Secondary | ICD-10-CM

## 2019-10-07 DIAGNOSIS — R110 Nausea: Secondary | ICD-10-CM

## 2019-10-07 DIAGNOSIS — R197 Diarrhea, unspecified: Secondary | ICD-10-CM | POA: Diagnosis not present

## 2019-10-07 DIAGNOSIS — R112 Nausea with vomiting, unspecified: Secondary | ICD-10-CM

## 2019-10-07 MED ORDER — DICYCLOMINE HCL 10 MG PO CAPS: 10.0000 mg | ORAL_CAPSULE | Freq: Three times a day (TID) | ORAL | 1 refills | Status: DC

## 2019-10-07 NOTE — Telephone Encounter (Signed)
Patient states he is having diverticulitis and it is so bad he can not go to work. Scheduled pt for first open with Dr. Vicente Males on 6.30.21. Patient wanting advice until then.

## 2019-10-07 NOTE — Patient Instructions (Addendum)
Thank you for coming to the office today.    Dr Oletha Blend [facility]) [address] [place], [place] [address] Phone: [phone]  Please schedule a Follow-up Appointment to: Return in about 2 weeks (around 10/21/2019), or if symptoms worsen or fail to improve, for 1-2 week follow-up GI / hospital.  If you have any other questions or concerns, please feel free to call the office or send a message through [place]. You may also schedule an earlier appointment if necessary.  Additionally, you may be receiving a survey about your experience at our office within a few days to 1 week by e-mail or mail. We value your feedback.  Nobie Putnam, DO [place]

## 2019-10-07 NOTE — Progress Notes (Signed)
Virtual Visit via Telephone The purpose of this virtual visit is to provide medical care while limiting exposure to the novel coronavirus (COVID19) for both patient and office staff.  Consent was obtained for phone visit:  Yes.   Answered questions that patient had about telehealth interaction:  Yes.   I discussed the limitations, risks, security and privacy concerns of performing an evaluation and management service by telephone. I also discussed with the patient that there may be a patient responsible charge related to this service. The patient expressed understanding and agreed to proceed.  Patient Location: Home Provider Location: [facility])  ---------------------------------------------------------------------- Chief Complaint  Patient presents with  . Diverticulitis    S: Reviewed CMA documentation. 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 Recent history was hospitalized 08/2019 for acute diverticulitis, see last note from me on 09/30/19 office visit he was improving at that time. Had labs done showed improved Hemoglobin iron level Last telemedicine visit with me on 10/03/19 for symptoms of nausea vomiting dizziness. He was somewhat improved at that time, but still had GI losses nausea vomiting, he was given re order of Zofran, Phenergan and Meclizine PRN.  He has remained out of work, our office is waiting on copy of [place] paperwork  Today now he has scheduled another follow-up on same issue. With persistent nausea, vomiting, and loose stool diarrhea as well. No rectal bleeding. - he has had some gradual improvement in bowel habits, has some occasional well formed stools but then he can have episodic soft bowel  movements or diarrhea still - He takes nausea meds with some good results, but doesn't want to keep taking nausea med if he can avoid it, also asking about diarrhea, he can't work if having frequent diarrhea loose stools. - He was originally supposed to see GI outpatient at [place] He was referred to [place] office by me on 5/3 after his hospital follow-up, they attempted to call patient on 5/4 but did not reach patient. Now he was asked to call them back to schedule  Denies any fevers, chills, sweats, body ache, cough, shortness of breath, sinus pain or pressure, headache, abdominal pain  Past Medical History:  Diagnosis Date  . Diverticulosis   . Gastric ulcer   . GERD (gastroesophageal reflux disease)   . History of hiatal hernia   . Hypertension   . 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 Smoking status: Former Smoker    Packs/day: 2.00    Years: 25.00    Pack years: 50.00    Types: Cigarettes    Quit date: 05/31/1987    Years since quitting: 32.3  . Smokeless tobacco: Former Network engineer Use Topics  . Alcohol use: Yes    Comment: 8-10 beers per day  . Drug use: No    Current Outpatient Medications:  .  amLODipine-benazepril (LOTREL) 10-40 MG capsule, TAKE 1 CAPSULE BY MOUTH EVERY DAY, Disp: 90 capsule, Rfl: 1 .  meclizine (ANTIVERT) 25 MG tablet, Take 1 tablet (25 mg total) by mouth 3 (three) times daily as needed for dizziness or nausea., Disp: 30 tablet, Rfl: 0 .  omeprazole (PRILOSEC) 40 MG  capsule, TAKE 1 CAPSULE BY MOUTH TWICE A DAY, Disp: 180 capsule, Rfl: 1 .  ondansetron (ZOFRAN ODT) 8 MG disintegrating tablet, Take 1 tablet (8 mg total) by mouth every 8 (eight) hours as needed for nausea or vomiting., Disp: 30 tablet, Rfl: 1 .  promethazine (PHENERGAN) 25 MG tablet, Take 0.5-1 tablets (12.5-25 mg total) by mouth every 8 (eight) hours as needed for nausea or vomiting., Disp:  30 tablet, Rfl: 0 .  Vitamins/Minerals TABS, Take by mouth., Disp: , Rfl:  .  dicyclomine (BENTYL) 10 MG capsule, Take 1 capsule (10 mg total) by mouth 4 (four) times daily -  before meals and at bedtime., Disp: 40 capsule, Rfl: 1  Depression screen [facility] 2/9 09/30/2019 11/20/2018 11/08/2016  Decreased Interest 0 0 0  Down, Depressed, Hopeless 0 0 0  PHQ - 2 Score 0 0 0  Altered sleeping - - 0  Tired, decreased energy - - 1  Change in appetite - - 0  Feeling bad or failure about yourself  - - 0  Trouble concentrating - - 0  Moving slowly or fidgety/restless - - 0  Suicidal thoughts - - 0  PHQ-9 Score - - 1    No flowsheet data found.  -------------------------------------------------------------------------- O: No physical exam performed due to remote telephone encounter.  Lab results reviewed.  Recent Results (from the past 2160 hour(s))  Comprehensive metabolic panel     Status: Abnormal   Collection Time: 09/19/19 10:02 AM  Result Value Ref Range   Sodium 134 (L) 135 - 145 mmol/L   Potassium 3.6 3.5 - 5.1 mmol/L   Chloride 100 98 - 111 mmol/L   CO2 23 22 - 32 mmol/L   Glucose, Bld 97 70 - 99 mg/dL    Comment: Glucose reference range applies only to samples taken after fasting for at least 8 hours.   BUN 6 6 - 20 mg/dL   Creatinine, Ser 0.57 (L) 0.61 - 1.24 mg/dL   Calcium 8.6 (L) 8.9 - 10.3 mg/dL   Total Protein 7.4 6.5 - 8.1 g/dL   Albumin 3.9 3.5 - 5.0 g/dL   AST 37 15 - 41 U/L   ALT 29 0 - 44 U/L   Alkaline Phosphatase 55 38 - 126 U/L   Total Bilirubin 0.5 0.3 - 1.2 mg/dL   GFR calc non Af Amer >60 >60 mL/min   GFR calc Af Amer >60 >60 mL/min   Anion gap 11 5 - 15    Comment: Performed at [facility], [address] [place], [place] [address]  CBC     Status: Abnormal   Collection Time: 09/19/19 10:02 AM  Result Value Ref Range   WBC 12.6 (H) 4.0 - 10.5 K/uL   RBC 4.12 (L) 4.22 - 5.81 MIL/uL   Hemoglobin 13.5 13.0 - 17.0 g/dL   HCT 37.8 (L) 39.0 -  52.0 %   MCV 91.7 80.0 - 100.0 fL   MCH 32.8 26.0 - 34.0 pg   MCHC 35.7 30.0 - 36.0 g/dL   RDW 14.0 11.5 - 15.5 %   Platelets 351 150 - 400 K/uL   nRBC 0.0 0.0 - 0.2 %    Comment: Performed at [facility], [address] [place], [place] [address]  Type and screen [place]     Status: None   Collection Time: 09/19/19 10:02 AM  Result Value Ref Range   ABO/RH(D) A POS    Antibody Screen NEG  Sample Expiration      09/22/2019,2359 Performed at [facility], [address] [place], [place] [address]   Lipase, blood     Status: None   Collection Time: 09/19/19 10:02 AM  Result Value Ref Range   Lipase 41 11 - 51 U/L    Comment: Performed at [facility], [address] [place], [place] [address]  Lactic acid, plasma     Status: None   Collection Time: 09/19/19  2:04 PM  Result Value Ref Range   Lactic Acid, Venous 1.9 0.5 - 1.9 mmol/L    Comment: Performed at [facility], [address] [place], [place] [address]  CBC     Status: Abnormal   Collection Time: 09/19/19  2:04 PM  Result Value Ref Range   WBC 12.5 (H) 4.0 - 10.5 K/uL   RBC 4.03 (L) 4.22 - 5.81 MIL/uL   Hemoglobin 13.0 13.0 - 17.0 g/dL   HCT 38.2 (L) 39.0 - 52.0 %   MCV 94.8 80.0 - 100.0 fL   MCH 32.3 26.0 - 34.0 pg   MCHC 34.0 30.0 - 36.0 g/dL   RDW 14.0 11.5 - 15.5 %   Platelets 380 150 - 400 K/uL   nRBC 0.0 0.0 - 0.2 %    Comment: Performed at [facility], [address] [place], [place] [address]  Urinalysis, Complete w Microscopic     Status: Abnormal   Collection Time: 09/19/19  3:49 PM  Result Value Ref Range   Color, Urine STRAW (A) YELLOW   APPearance CLEAR (A) CLEAR   Specific Gravity, Urine 1.023 1.005 - 1.030   pH 6.0 5.0 - 8.0   Glucose, UA NEGATIVE NEGATIVE mg/dL   Hgb urine dipstick NEGATIVE NEGATIVE   Bilirubin Urine NEGATIVE NEGATIVE   Ketones, ur 5 (A) NEGATIVE mg/dL   Protein, ur NEGATIVE NEGATIVE mg/dL    Nitrite NEGATIVE NEGATIVE   Leukocytes,Ua NEGATIVE NEGATIVE   RBC / HPF 0-5 0 - 5 RBC/hpf   WBC, UA NONE SEEN 0 - 5 WBC/hpf   Bacteria, UA NONE SEEN NONE SEEN   Squamous Epithelial / LPF NONE SEEN 0 - 5    Comment: Performed at [facility], [address] [place], [place] [address]  Respiratory Panel by RT PCR (Flu A&B, Covid) - Nasopharyngeal Swab     Status: None   Collection Time: 09/19/19  3:49 PM   Specimen: Nasopharyngeal Swab  Result Value Ref Range   SARS Coronavirus 2 by RT PCR NEGATIVE NEGATIVE    Comment: (NOTE) SARS-CoV-2 target nucleic acids are NOT DETECTED. The SARS-CoV-2 RNA is generally detectable in upper respiratoy specimens during the acute phase of infection. The lowest concentration of SARS-CoV-2 viral copies this assay can detect is 131 copies/mL. A negative result does not preclude SARS-Cov-2 infection and should not be used as the sole basis for treatment or other patient management decisions. A negative result may occur with  improper specimen collection/handling, submission of specimen other than nasopharyngeal swab, presence of viral mutation(s) within the areas targeted by this assay, and inadequate number of viral copies (<131 copies/mL). A negative result must be combined with clinical observations, patient history, and epidemiological information. The expected result is Negative. Fact Sheet for Patients:  P[place]Cheek.be Fact Sheet for Healthcare Providers:  GravelBags.it This test is not yet ap proved or cleared by the [place] FDA and  has been authorized for detection and/or diagnosis of SARS-CoV-2 by FDA under an Emergency Use  Authorization (EUA). This EUA will remain  in effect (meaning this test can be used) for the duration of the COVID-19 declaration under Section 564(b)(1) of the Act, 21 U.S.C. section 360bbb-3(b)(1), unless the authorization is terminated or revoked  sooner.    Influenza A by PCR NEGATIVE NEGATIVE   Influenza B by PCR NEGATIVE NEGATIVE    Comment: (NOTE) The Xpert Xpress SARS-CoV-2/FLU/RSV assay is intended as an aid in  the diagnosis of influenza from Nasopharyngeal swab specimens and  should not be used as a sole basis for treatment. Nasal washings and  aspirates are unacceptable for Xpert Xpress SARS-CoV-2/FLU/RSV  testing. Fact Sheet for Patients: PinkCheek.be Fact Sheet for Healthcare Providers: GravelBags.it This test is not yet approved or cleared by the [place] FDA and  has been authorized for detection and/or diagnosis of SARS-CoV-2 by  FDA under an Emergency Use Authorization (EUA). This EUA will remain  in effect (meaning this test can be used) for the duration of the  Covid-19 declaration under Section 564(b)(1) of the Act, 21  U.S.C. section 360bbb-3(b)(1), unless the authorization is  terminated or revoked. Performed at [facility], [address] [place], [place] [address]   Culture, blood (Routine X 2) w Reflex to ID Panel     Status: None   Collection Time: 09/19/19  6:06 PM   Specimen: BLOOD  Result Value Ref Range   Specimen Description BLOOD RIGHT ANTECUBITAL    Special Requests      BOTTLES DRAWN AEROBIC AND ANAEROBIC Blood Culture adequate volume   Culture      NO GROWTH 5 DAYS Performed at [facility], [address], [place], [place] [address]    Report Status 09/24/2019 FINAL   Culture, blood (Routine X 2) w Reflex to ID Panel     Status: None   Collection Time: 09/19/19  6:18 PM   Specimen: BLOOD  Result Value Ref Range   Specimen Description BLOOD BLOOD RIGHT HAND    Special Requests      BOTTLES DRAWN AEROBIC AND ANAEROBIC Blood Culture adequate volume   Culture      NO GROWTH 5 DAYS Performed at [facility], [address] [place], [place] [address]    Report Status 09/24/2019 FINAL   APTT      Status: None   Collection Time: 09/19/19  6:18 PM  Result Value Ref Range   aPTT 30 24 - 36 seconds    Comment: Performed at [facility], [address] [place], [place] [address]  Protime-INR     Status: None   Collection Time: 09/19/19  6:18 PM  Result Value Ref Range   Prothrombin Time 12.4 11.4 - 15.2 seconds   INR 0.9 0.8 - 1.2    Comment: (NOTE) INR goal varies based on device and disease states. Performed at [facility], [address] [place], [place] [address]   HIV Antibody (routine testing w rflx)     Status: None   Collection Time: 09/19/19  6:18 PM  Result Value Ref Range   HIV Screen 4th Generation wRfx NON REACTIVE NON REACTIVE    Comment: Performed at [facility], [place] [address], [place], [place] [address]  Comprehensive metabolic panel     Status: Abnormal   Collection Time: 09/19/19  6:24 PM  Result Value Ref Range   Sodium 135 135 - 145 mmol/L   Potassium 3.4 (L) 3.5 - 5.1 mmol/L   Chloride 103 98 -  111 mmol/L   CO2 22 22 - 32 mmol/L   Glucose, Bld 209 (H) 70 - 99 mg/dL    Comment: Glucose reference range applies only to samples taken after fasting for at least 8 hours.   BUN 7 6 - 20 mg/dL   Creatinine, Ser 0.64 0.61 - 1.24 mg/dL   Calcium 8.4 (L) 8.9 - 10.3 mg/dL   Total Protein 6.3 (L) 6.5 - 8.1 g/dL   Albumin 3.5 3.5 - 5.0 g/dL   AST 30 15 - 41 U/L   ALT 24 0 - 44 U/L   Alkaline Phosphatase 46 38 - 126 U/L   Total Bilirubin 0.7 0.3 - 1.2 mg/dL   GFR calc non Af Amer >60 >60 mL/min   GFR calc Af Amer >60 >60 mL/min   Anion gap 10 5 - 15    Comment: Performed at [facility], [address] [place], [place] [address]  CBC     Status: Abnormal   Collection Time: 09/19/19  6:24 PM  Result Value Ref Range   WBC 10.1 4.0 - 10.5 K/uL   RBC 3.59 (L) 4.22 - 5.81 MIL/uL   Hemoglobin 11.6 (L) 13.0 - 17.0 g/dL   HCT 33.0 (L) 39.0 - 52.0 %   MCV 91.9 80.0 - 100.0 fL   MCH 32.3 26.0 - 34.0 pg   MCHC 35.2 30.0 -  36.0 g/dL   RDW 14.1 11.5 - 15.5 %   Platelets 339 150 - 400 K/uL   nRBC 0.0 0.0 - 0.2 %    Comment: Performed at [facility], [address] [place], [place] [address]  CBC     Status: Abnormal   Collection Time: 09/19/19 10:51 PM  Result Value Ref Range   WBC 9.0 4.0 - 10.5 K/uL   RBC 3.30 (L) 4.22 - 5.81 MIL/uL   Hemoglobin 10.8 (L) 13.0 - 17.0 g/dL   HCT 31.7 (L) 39.0 - 52.0 %   MCV 96.1 80.0 - 100.0 fL   MCH 32.7 26.0 - 34.0 pg   MCHC 34.1 30.0 - 36.0 g/dL   RDW 13.9 11.5 - 15.5 %   Platelets 318 150 - 400 K/uL   nRBC 0.0 0.0 - 0.2 %    Comment: Performed at [facility], [address] [place], [place] [address]  CBC     Status: Abnormal   Collection Time: 09/20/19  6:27 AM  Result Value Ref Range   WBC 11.6 (H) 4.0 - 10.5 K/uL   RBC 3.17 (L) 4.22 - 5.81 MIL/uL   Hemoglobin 10.3 (L) 13.0 - 17.0 g/dL   HCT 30.1 (L) 39.0 - 52.0 %   MCV 95.0 80.0 - 100.0 fL   MCH 32.5 26.0 - 34.0 pg   MCHC 34.2 30.0 - 36.0 g/dL   RDW 13.6 11.5 - 15.5 %   Platelets 300 150 - 400 K/uL   nRBC 0.0 0.0 - 0.2 %    Comment: Performed at [facility], [address] [place], [place] [address]  CBC     Status: Abnormal   Collection Time: 09/20/19 10:43 AM  Result Value Ref Range   WBC 15.1 (H) 4.0 - 10.5 K/uL   RBC 3.10 (L) 4.22 - 5.81 MIL/uL   Hemoglobin 10.1 (L) 13.0 - 17.0 g/dL   HCT 28.6 (L) 39.0 - 52.0 %   MCV 92.3 80.0 - 100.0 fL   MCH 32.6 26.0 - 34.0 pg   MCHC 35.3 30.0 - 36.0  g/dL   RDW 13.8 11.5 - 15.5 %   Platelets 316 150 - 400 K/uL   nRBC 0.0 0.0 - 0.2 %    Comment: Performed at [facility], [address] [place], [place] [id number]  BASIC METABOLIC PANEL WITH GFR     Status: Abnormal   Collection Time: 09/30/19 10:08 AM  Result Value Ref Range   Glucose, Bld 104 (H) 65 - 99 mg/dL    Comment: .            Fasting reference interval . For someone without known diabetes, a glucose value between 100 and 125 mg/dL is consistent  with prediabetes and should be confirmed with a follow-up test. .    BUN 6 (L) 7 - 25 mg/dL   Creat 0.61 (L) 0.70 - 1.33 mg/dL    Comment: For patients >40 years of age, the reference limit for Creatinine is approximately 13% higher for people identified as African-American. .    GFR, Est Non African American 111 > OR = 60 mL/min/1.14m2   GFR, Est African American 128 > OR = 60 mL/min/1.29m2   BUN/Creatinine Ratio 10 6 - 22 (calc)   Sodium 137 135 - 146 mmol/L   Potassium 4.1 3.5 - 5.3 mmol/L   Chloride 100 98 - 110 mmol/L   CO2 25 20 - 32 mmol/L   Calcium 9.3 8.6 - 10.3 mg/dL  CBC with Differential/Platelet     Status: Abnormal   Collection Time: 09/30/19 10:08 AM  Result Value Ref Range   WBC 7.0 3.8 - 10.8 Thousand/uL   RBC 3.39 (L) 4.20 - 5.80 Million/uL   Hemoglobin 10.6 (L) 13.2 - 17.1 g/dL   HCT 33.0 (L) 38.5 - 50.0 %   MCV 97.3 80.0 - 100.0 fL   MCH 31.3 27.0 - 33.0 pg   MCHC 32.1 32.0 - 36.0 g/dL   RDW 14.0 11.0 - 15.0 %   Platelets 542 (H) 140 - 400 Thousand/uL   MPV 8.5 7.5 - 12.5 fL   Neutro Abs 3,892 1,500 - 7,800 cells/uL   Lymphs Abs 2,121 850 - 3,900 cells/uL   Absolute Monocytes 637 200 - 950 cells/uL   Eosinophils Absolute 140 15 - 500 cells/uL   Basophils Absolute 210 (H) 0 - 200 cells/uL   Neutrophils Relative % 55.6 %   Total Lymphocyte 30.3 %   Monocytes Relative 9.1 %   Eosinophils Relative 2.0 %   Basophils Relative 3.0 %    -------------------------------------------------------------------------- A&P:  Problem List Items Addressed This Visit    None    Visit Diagnoses    Non-intractable vomiting with nausea, unspecified vomiting type    -  Primary   Nausea       Diarrhea, unspecified type       Relevant Medications   dicyclomine (BENTYL) 10 MG capsule   Abdominal cramping       Relevant Medications   dicyclomine (BENTYL) 10 MG capsule     Concerning still with persistent GI symptoms following recent hospitalization, uncertain if  some level of recurrence or constellation of symptoms - he has not recovered yet and not ready to return to work based on active nausea vomiting diarrhea weakness symptoms. Unable to perform job.  He is improved with nausea vomiting while on meds PRN Zofran, Phenergan but goal is to wean down on meds so he can function without them.  Still has diarrhea. Not on medicine for that. Assoc with abdominal cramping Start trial of Dicyclomine  bentyl 10mg  TID WC and QHS PRN - goal to slow down diarrhea, cramping.  He may try OTC Imodium PRN as well, use precautions use only as last resort if needed to slow bowel movements down  Keep improving diet nutrition.  Ultimately may have variety of functional GI symptoms affecting him following hospitalization, may take more time for digestion to normalize.  gave patient AG[facility] Dr Vicente Males # today to call to schedule, according to chart, referral to them on 5/3 at hospital f/u then they called 5/4 but LVM missed patient. He needs to be seen for further diagnostic eval, to determine when can return to work.  I advised him that I would estimate his return to work date in about 2 weeks. 10/21/19 unless significant change in his course at this time. But ultimately would need GI evaluation given persistent problem.  Meds ordered this encounter  Medications  . dicyclomine (BENTYL) 10 MG capsule    Sig: Take 1 capsule (10 mg total) by mouth 4 (four) times daily -  before meals and at bedtime.    Dispense:  40 capsule    Refill:  1    Follow-up: - Return in 1-2 weeks for follow-up GI symptoms, needs to see GI specialist next  Patient verbalizes understanding with the above medical recommendations including the limitation of remote medical advice.  Specific follow-up and call-back criteria were given for patient to follow-up or seek medical care more urgently if needed.   - Time spent in direct consultation with patient on phone: 9 minutes   Nobie Putnam,  [facility] 10/07/2019, 1:51 PM

## 2019-10-09 NOTE — Telephone Encounter (Signed)
Spoke with pt and informed him of Dr. Georgeann Oppenheim suggestions. Pt agrees to rescheduling his appointment to an earlier date with a different provider. Appointment has been scheduled.

## 2019-10-09 NOTE — Telephone Encounter (Signed)
I have never seen the patient before - cant give any advise till seen options  Any other GI physician who is available sooner PCP to contact if not urgent care

## 2019-10-15 ENCOUNTER — Other Ambulatory Visit: Payer: Self-pay

## 2019-10-15 ENCOUNTER — Ambulatory Visit: Payer: Commercial Managed Care - PPO | Admitting: Gastroenterology

## 2019-10-15 ENCOUNTER — Encounter: Payer: Self-pay | Admitting: Gastroenterology

## 2019-10-15 VITALS — BP 130/75 | HR 128 | Temp 98.1°F | Ht 67.0 in | Wt 153.5 lb

## 2019-10-15 DIAGNOSIS — R197 Diarrhea, unspecified: Secondary | ICD-10-CM

## 2019-10-15 DIAGNOSIS — R1013 Epigastric pain: Secondary | ICD-10-CM

## 2019-10-15 MED ORDER — NA SULFATE-K SULFATE-MG SULF 17.5-3.13-1.6 GM/177ML PO SOLN: 354.0000 mL | Freq: Once | ORAL | 0 refills | Status: AC

## 2019-10-15 NOTE — Progress Notes (Signed)
Luis Darby, MD [address]  [place]  [place], [place] [address]  Main: [phone]  Fax: [phone]    Gastroenterology Consultation  Referring Provider:     Nobie Putnam * Primary Care Physician:  Olin Hauser, DO Primary Gastroenterologist:  Dr. Vicente Males Reason for Consultation:     History of acute sigmoid diverticulitis, lower abdominal pain and increased bowel frequency        HPI:   Luis Porter is a 59 y.o. male referred by Dr. Parks Ranger, [name], DO  for consultation & management of approximately 1 month history of lower abdominal pain, worse in left lower quadrant associated with 2-3 soft bowel movements daily.  Patient had recent episode of acute sigmoid diverticulitis, admitted to [facility] end of April 2021, CT confirmed presence of mild diverticulitis of the proximal sigmoid colon, uncomplicated.  This was treated with Augmentin.  Patient had bloody diarrhea during this event.  Since that admission, he did not have any episodes of rectal bleeding.  Patient reports having another event in 05/2019.  He acknowledges drinking alcohol, 6-7 beers per day for several years.  He does report fatigue, lower abdominal cramps as well as epigastric pain.  He denies melena.  He is currently on omeprazole 40 mg twice daily He also reports weight loss, unable to go to work due to bowel urgency and abdominal cramps postprandial  NSAIDs: None  Antiplts/Anticoagulants/Anti thrombotics: None  GI Procedures: EGD and colonoscopy 12/30/2016 by Dr. Vicente Males DIAGNOSIS:  A. STOMACH POLYP; COLD BIOPSY:  - HYPERPLASTIC GASTRIC POLYP.  - NEGATIVE FOR H. PYLORI, DYSPLASIA, AND MALIGNANCY.   B. GEJ; COLD BIOPSY:  - REFLUX GASTROESOPHAGITIS.  - NEGATIVE FOR DYSPLASIA AND MALIGNANCY.   C. COLON POLYP, CECUM; COLD SNARE:  - TUBULAR ADENOMA.  - NEGATIVE FOR HIGH GRADE DYSPLASIA AND MALIGNANCY.   D. COLON POLYP, TRANSVERSE; COLD SNARE:  - TUBULAR ADENOMA.  -  NEGATIVE FOR HIGH GRADE DYSPLASIA AND MALIGNANCY.   E. COLON POLYP 3, SIGMOID; COLD SNARE:  - TUBULAR ADENOMA (4 FRAGMENTS).  - NEGATIVE FOR HIGH GRADE DYSPLASIA AND MALIGNANCY.   Past Medical History:  Diagnosis Date  . Diverticulosis   . Gastric ulcer   . GERD (gastroesophageal reflux disease)   . History of hiatal hernia   . Hypertension   . 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 COLONOSCOPY WITH PROPOFOL N/A 12/30/2016   Procedure: COLONOSCOPY WITH PROPOFOL;  Surgeon: [name], MD;  Location: [facility] ENDOSCOPY;  Service: Endoscopy;  Laterality: N/A;  . ESOPHAGOGASTRODUODENOSCOPY (EGD) WITH PROPOFOL N/A 12/30/2016   Procedure: ESOPHAGOGASTRODUODENOSCOPY (EGD) WITH PROPOFOL;  Surgeon: [name], MD;  Location: [facility] ENDOSCOPY;  Service: Endoscopy;  Laterality: N/A;  . HERNIA REPAIR  [id number]   Umbilical Hernia Repair  . LUMBAR LAMINECTOMY/DECOMPRESSION MICRODISCECTOMY Right 05/18/2016   Procedure: right L4-5 microdiscectomy;  Surgeon: Blanche East, MD;  Location: [facility] ORS;  Service: Neurosurgery;  Laterality: Right;  . TONSILLECTOMY    . UMBILICAL HERNIA REPAIR N/A 06/29/2018   Procedure: LAPAROSCOPIC REPAIR OF RECURRENT UMBILICAL HERNIA WITH MESH;  Surgeon: Vickie Epley, MD;  Location: [facility] ORS;  Service: General;  Laterality: N/A;    Prior to Admission medications   Medication Sig Start Date End Date Taking? Authorizing Provider  amLODipine-benazepril (LOTREL) 10-40 MG capsule TAKE 1 CAPSULE BY MOUTH EVERY DAY 06/07/19  Yes Karamalegos, [name], DO  dicyclomine (BENTYL) 10 MG  capsule Take 1 capsule (10 mg total) by mouth 4 (four) times daily -  before meals and at bedtime. 10/07/19  Yes Karamalegos, [name], DO  meclizine (ANTIVERT) 25 MG tablet Take 1 tablet (25 mg total) by mouth 3 (three) times daily as needed for dizziness or nausea. 10/03/19  Yes Karamalegos,  [name], DO  omeprazole (PRILOSEC) 40 MG capsule TAKE 1 CAPSULE BY MOUTH TWICE A DAY 06/07/19  Yes Karamalegos, Alexander J, DO  ondansetron (ZOFRAN ODT) 8 MG disintegrating tablet Take 1 tablet (8 mg total) by mouth every 8 (eight) hours as needed for nausea or vomiting. 10/03/19  Yes Karamalegos, [name], DO  promethazine (PHENERGAN) 25 MG tablet Take 0.5-1 tablets (12.5-25 mg total) by mouth every 8 (eight) hours as needed for nausea or vomiting. 10/03/19  Yes Karamalegos, [name], DO  Vitamins/Minerals TABS Take by mouth.   Yes [provider]    Current Outpatient Medications:  .  amLODipine-benazepril (LOTREL) 10-40 MG capsule, TAKE 1 CAPSULE BY MOUTH EVERY DAY, Disp: 90 capsule, Rfl: 1 .  dicyclomine (BENTYL) 10 MG capsule, Take 1 capsule (10 mg total) by mouth 4 (four) times daily -  before meals and at bedtime., Disp: 40 capsule, Rfl: 1 .  meclizine (ANTIVERT) 25 MG tablet, Take 1 tablet (25 mg total) by mouth 3 (three) times daily as needed for dizziness or nausea., Disp: 30 tablet, Rfl: 0 .  omeprazole (PRILOSEC) 40 MG capsule, TAKE 1 CAPSULE BY MOUTH TWICE A DAY, Disp: 180 capsule, Rfl: 1 .  ondansetron (ZOFRAN ODT) 8 MG disintegrating tablet, Take 1 tablet (8 mg total) by mouth every 8 (eight) hours as needed for nausea or vomiting., Disp: 30 tablet, Rfl: 1 .  promethazine (PHENERGAN) 25 MG tablet, Take 0.5-1 tablets (12.5-25 mg total) by mouth every 8 (eight) hours as needed for nausea or vomiting., Disp: 30 tablet, Rfl: 0 .  Vitamins/Minerals TABS, Take by mouth., Disp: , Rfl:  .  Na Sulfate-K Sulfate-Mg Sulf 17.5-3.13-1.6 GM/177ML SOLN, Take 354 mLs by mouth once for 1 dose., Disp: 354 mL, Rfl: 0  Family History  Problem Relation Age of Onset  . Hypertension Mother   . Cancer Mother        Ovarian  . Hypertension Father   . Cancer Father        tongue  . Hypertension Brother   . Prostate cancer Neg Hx   . Breast cancer Neg Hx   . Colon cancer Neg Hx   .  Heart attack Neg Hx   . Stroke Neg Hx      Social History   Tobacco Use  . Smoking status: Former Smoker    Packs/day: 2.00    Years: 25.00    Pack years: 50.00    Types: Cigarettes    Quit date: 05/31/1987    Years since quitting: 32.3  . Smokeless tobacco: Former Network engineer Use Topics  . Alcohol use: Yes    Comment: 8-10 beers per day  . Drug use: No    Allergies as of 10/15/2019 - Review Complete 10/15/2019  Allergen Reaction Noted  . Nsaids Other (See Comments) 05/09/2016    Review of Systems:    All systems reviewed and negative except where noted in HPI.   Physical Exam:  BP 130/75 (BP Location: Left Arm, Patient Position: Sitting, Cuff Size: Normal)   Pulse (!) 128   Temp 98.1 F (36.7 C) (Oral)   Ht 5\' 7"  (1.702 m)  Wt 153 lb 8 oz (69.6 kg)   BMI 24.04 kg/m  No LMP for male patient.  General:   Alert, moderately built, moderately nourished, pleasant and cooperative in NAD Head:  Normocephalic and atraumatic. Eyes:  Sclera clear, no icterus.   Conjunctiva pink. Ears:  Normal auditory acuity. Nose:  No deformity, discharge, or lesions. Mouth:  No deformity or lesions,oropharynx pink & moist. Neck:  Supple; no masses or thyromegaly. Lungs:  Respirations even and unlabored.  Clear throughout to auscultation.   No wheezes, crackles, or rhonchi. No acute distress. Heart:  Regular rate and rhythm; no murmurs, clicks, rubs, or gallops. Abdomen:  Normal bowel sounds. Soft, non-tender and non-distended without masses, hepatosplenomegaly or hernias noted.  No guarding or rebound tenderness.   Rectal: Not performed Msk:  Symmetrical without gross deformities. Good, equal movement & strength bilaterally. Pulses:  Normal pulses noted. Extremities:  No clubbing or edema.  No cyanosis. Neurologic:  Alert and oriented x3;  grossly normal neurologically. Skin:  Intact without significant lesions or rashes. No jaundice. Psych:  Alert and cooperative. Normal mood and  affect.  Imaging Studies: Reviewed  Assessment and Plan:   Carlosmanuel Priddy Errico is a 59 y.o. male with history of alcohol abuse, no evidence of chronic liver disease or cirrhosis is seen in consultation for recurrent episodes of acute diverticulitis, epigastric pain  Recurrent acute sigmoid diverticulitis: Uncomplicated Treated with antibiotics in the past Due to ongoing nonbloody diarrhea and abdominal cramps, recommend stool studies to evaluate for infection including C. Difficile Recommend colonoscopy after ruling out infection from stool studies, with pediatric colonoscope, TI evaluation, colon biopsies  Epigastric pain, normocytic anemia Recommend EGD for further evaluation Recheck CBC, iron studies, B12 and folate panel Abstinence from alcohol use Continue omeprazole 40 mg twice daily   Follow up in 1 month   Luis Darby, MD

## 2019-10-16 LAB — CBC
Hematocrit: 34 % — ABNORMAL LOW (ref 37.5–51.0)
Hemoglobin: 10.8 g/dL — ABNORMAL LOW (ref 13.0–17.7)
MCH: 29.4 pg (ref 26.6–33.0)
MCHC: 31.8 g/dL (ref 31.5–35.7)
MCV: 93 fL (ref 79–97)
Platelets: 348 10*3/uL (ref 150–450)
RBC: 3.67 x10E6/uL — ABNORMAL LOW (ref 4.14–5.80)
RDW: 14.9 % (ref 11.6–15.4)
WBC: 5.7 10*3/uL (ref 3.4–10.8)

## 2019-10-16 LAB — IRON,TIBC AND FERRITIN PANEL
Ferritin: 19 ng/mL — ABNORMAL LOW (ref 30–400)
Iron Saturation: 3 % — CL (ref 15–55)
Iron: 14 ug/dL — ABNORMAL LOW (ref 38–169)
Total Iron Binding Capacity: 506 ug/dL — ABNORMAL HIGH (ref 250–450)
UIBC: 492 ug/dL — ABNORMAL HIGH (ref 111–343)

## 2019-10-16 LAB — B12 AND FOLATE PANEL
Folate: 17.2 ng/mL (ref >3.0)
Vitamin B-12: 863 pg/mL (ref 232–1245)

## 2019-10-17 ENCOUNTER — Telehealth: Payer: Self-pay

## 2019-10-17 ENCOUNTER — Encounter: Payer: Self-pay | Admitting: Gastroenterology

## 2019-10-17 ENCOUNTER — Other Ambulatory Visit: Payer: Self-pay | Admitting: Gastroenterology

## 2019-10-17 DIAGNOSIS — D509 Iron deficiency anemia, unspecified: Secondary | ICD-10-CM

## 2019-10-17 NOTE — Telephone Encounter (Signed)
Did referral  

## 2019-10-17 NOTE — Telephone Encounter (Signed)
-----   Message from Lin Landsman, MD sent at 10/17/2019  7:57 AM EDT ----- Severe IDA, recommend referral to Hem/onc for IV iron  RV

## 2019-10-18 ENCOUNTER — Inpatient Hospital Stay: Payer: Commercial Managed Care - PPO | Attending: Oncology | Admitting: Oncology

## 2019-10-18 ENCOUNTER — Encounter: Payer: Self-pay | Admitting: Oncology

## 2019-10-18 ENCOUNTER — Other Ambulatory Visit: Payer: Self-pay

## 2019-10-18 ENCOUNTER — Inpatient Hospital Stay: Payer: Commercial Managed Care - PPO

## 2019-10-18 VITALS — BP 137/81 | HR 93 | Temp 96.8°F | Resp 18 | Ht 67.0 in | Wt 154.5 lb

## 2019-10-18 DIAGNOSIS — Z8249 Family history of ischemic heart disease and other diseases of the circulatory system: Secondary | ICD-10-CM | POA: Diagnosis not present

## 2019-10-18 DIAGNOSIS — K219 Gastro-esophageal reflux disease without esophagitis: Secondary | ICD-10-CM | POA: Diagnosis not present

## 2019-10-18 DIAGNOSIS — Z801 Family history of malignant neoplasm of trachea, bronchus and lung: Secondary | ICD-10-CM

## 2019-10-18 DIAGNOSIS — N400 Enlarged prostate without lower urinary tract symptoms: Secondary | ICD-10-CM

## 2019-10-18 DIAGNOSIS — I10 Essential (primary) hypertension: Secondary | ICD-10-CM | POA: Insufficient documentation

## 2019-10-18 DIAGNOSIS — Z8049 Family history of malignant neoplasm of other genital organs: Secondary | ICD-10-CM

## 2019-10-18 DIAGNOSIS — Z8041 Family history of malignant neoplasm of ovary: Secondary | ICD-10-CM | POA: Insufficient documentation

## 2019-10-18 DIAGNOSIS — Z87891 Personal history of nicotine dependence: Secondary | ICD-10-CM | POA: Insufficient documentation

## 2019-10-18 DIAGNOSIS — Z808 Family history of malignant neoplasm of other organs or systems: Secondary | ICD-10-CM | POA: Insufficient documentation

## 2019-10-18 DIAGNOSIS — D509 Iron deficiency anemia, unspecified: Secondary | ICD-10-CM | POA: Insufficient documentation

## 2019-10-18 DIAGNOSIS — Z79899 Other long term (current) drug therapy: Secondary | ICD-10-CM | POA: Diagnosis not present

## 2019-10-18 DIAGNOSIS — K5900 Constipation, unspecified: Secondary | ICD-10-CM | POA: Diagnosis not present

## 2019-10-18 HISTORY — DX: Iron deficiency anemia, unspecified: D50.9

## 2019-10-18 NOTE — Progress Notes (Signed)
Hematology/Oncology Consult note [facility] [phone] [phone]   Patient Care Team: Olin Hauser, DO as PCP - General (Family Medicine)  REFERRING PROVIDER: Lin Landsman, MD CHIEF COMPLAINTS/REASON FOR VISIT:  Evaluation of iron deficiency anemia  HISTORY OF PRESENTING ILLNESS:  Luis Porter is a  59 y.o.  male with PMH listed below was seen in consultation at the request of Lin Landsman, MD   for evaluation of iron deficiency anemia.   Reviewed patient's recent labs  10/15/2019 labs revealed anemia with hemoglobin of 10.8, MCV 93, platelet count 248,000, white blood cell 5.7.  Iron panel showed ferritin 19, iron saturation 3, TIBC 506. Reviewed patient's previous lab, anemia is acute onset since April 2021  Patient had an episode of acute sigmoid diverticulitis, with bloody bowel movements, and was admitted to [facility] in April 2021.  CT confirmed presence of diverticulitis.  Patient received antibiotic treatments.  Currently he does not have any additional bloody bowel movement episodes.  He has ongoing nonbloody diarrhea. Patient was recently seen by Dr. Marius Ditch 10/15/2019 there is plan for stool studies and colonoscopy after ruling out infectious etiology.  Patient drinks alcohol, 6-7 beers daily.  Currently on omeprazole 40 mg twice daily.  There is plan for EGD. Patient reports profound fatigue.  Review of Systems  Constitutional: Positive for fatigue. Negative for appetite change, chills, fever and unexpected weight change.  HENT:   Negative for hearing loss and voice change.   Eyes: Negative for eye problems and icterus.  Respiratory: Negative for chest tightness, cough and shortness of breath.   Cardiovascular: Negative for chest pain and leg swelling.  Gastrointestinal: Positive for diarrhea. Negative for abdominal distention and abdominal pain.  Endocrine: Negative for hot flashes.  Genitourinary:  Negative for difficulty urinating, dysuria and frequency.   Musculoskeletal: Negative for arthralgias.  Skin: Negative for itching and rash.  Neurological: Negative for light-headedness and numbness.  Hematological: Negative for adenopathy. Does not bruise/bleed easily.  Psychiatric/Behavioral: Negative for confusion.    MEDICAL HISTORY:  Past Medical History:  Diagnosis Date  . Diverticulosis   . Gastric ulcer   . GERD (gastroesophageal reflux disease)   . History of hiatal hernia   . Hypertension   . Recurrent umbilical hernia with incarceration 11/08/2016   Previously repaired x 1    SURGICAL HISTORY: Past Surgical History:  Procedure Laterality Date  . COLONOSCOPY WITH PROPOFOL N/A 12/30/2016   Procedure: COLONOSCOPY WITH PROPOFOL;  Surgeon: [name], MD;  Location: [facility] ENDOSCOPY;  Service: Endoscopy;  Laterality: N/A;  . ESOPHAGOGASTRODUODENOSCOPY (EGD) WITH PROPOFOL N/A 12/30/2016   Procedure: ESOPHAGOGASTRODUODENOSCOPY (EGD) WITH PROPOFOL;  Surgeon: [name], MD;  Location: [facility] ENDOSCOPY;  Service: Endoscopy;  Laterality: N/A;  . HERNIA REPAIR  [id number]   Umbilical Hernia Repair  . LUMBAR LAMINECTOMY/DECOMPRESSION MICRODISCECTOMY Right 05/18/2016   Procedure: right L4-5 microdiscectomy;  Surgeon: Blanche East, MD;  Location: [facility] ORS;  Service: Neurosurgery;  Laterality: Right;  . TONSILLECTOMY    . UMBILICAL HERNIA REPAIR N/A 06/29/2018   Procedure: LAPAROSCOPIC REPAIR OF RECURRENT UMBILICAL HERNIA WITH MESH;  Surgeon: Vickie Epley, MD;  Location: [facility] ORS;  Service: General;  Laterality: N/A;    SOCIAL HISTORY: Social History   Socioeconomic History  . Marital status: Divorced    Spouse name: Not on file  . Number of children: Not on file  . Years of education: Not on file  . Highest education level: Not on file  Occupational History  .  Not on file  Tobacco Use  . Smoking status: Former Smoker    Packs/day: 2.00    Years: 25.00    Pack years:  50.00    Types: Cigarettes    Quit date: 05/31/1987    Years since quitting: 32.4  . Smokeless tobacco: Former Network engineer and Sexual Activity  . Alcohol use: Yes    Comment: 8-10 beers per day  . Drug use: No  . Sexual activity: Yes    Comment: parners birth control  Other Topics Concern  . Not on file  Social History Narrative  . Not on file   Social Determinants of Health   Financial Resource Strain:   . Difficulty of Paying Living Expenses:   Food Insecurity:   . Worried About Charity fundraiser in the Last Year:   . Arboriculturist in the Last Year:   Transportation Needs:   . Film/video editor (Medical):   Marland Kitchen Lack of Transportation (Non-Medical):   Physical Activity:   . Days of Exercise per Week:   . Minutes of Exercise per Session:   Stress:   . Feeling of Stress :   Social Connections:   . Frequency of Communication with Friends and Family:   . Frequency of Social Gatherings with Friends and Family:   . Attends Religious Services:   . Active Member of Clubs or Organizations:   . Attends Archivist Meetings:   Marland Kitchen Marital Status:   Intimate Partner Violence:   . Fear of Current or Ex-Partner:   . Emotionally Abused:   Marland Kitchen Physically Abused:   . Sexually Abused:     FAMILY HISTORY: Family History  Problem Relation Age of Onset  . Hypertension Mother   . Cancer Mother        Ovarian  . Cervical cancer Mother   . Hypertension Father   . Cancer Father        tongue  . Throat cancer Father   . Hypertension Brother   . Prostate cancer Neg Hx   . Breast cancer Neg Hx   . Colon cancer Neg Hx   . Heart attack Neg Hx   . Stroke Neg Hx     ALLERGIES:  is allergic to nsaids.  MEDICATIONS:  Current Outpatient Medications  Medication Sig Dispense Refill  . amLODipine-benazepril (LOTREL) 10-40 MG capsule TAKE 1 CAPSULE BY MOUTH EVERY DAY 90 capsule 1  . dicyclomine (BENTYL) 10 MG capsule Take 1 capsule (10 mg total) by mouth 4 (four) times  daily -  before meals and at bedtime. 40 capsule 1  . meclizine (ANTIVERT) 25 MG tablet Take 1 tablet (25 mg total) by mouth 3 (three) times daily as needed for dizziness or nausea. 30 tablet 0  . omeprazole (PRILOSEC) 40 MG capsule TAKE 1 CAPSULE BY MOUTH TWICE A DAY 180 capsule 1  . ondansetron (ZOFRAN ODT) 8 MG disintegrating tablet Take 1 tablet (8 mg total) by mouth every 8 (eight) hours as needed for nausea or vomiting. 30 tablet 1  . promethazine (PHENERGAN) 25 MG tablet Take 0.5-1 tablets (12.5-25 mg total) by mouth every 8 (eight) hours as needed for nausea or vomiting. 30 tablet 0  . Vitamins/Minerals TABS Take by mouth.     No current facility-administered medications for this visit.     PHYSICAL EXAMINATION: ECOG PERFORMANCE STATUS: 1 - Symptomatic but completely ambulatory Vitals:   10/18/19 0943  BP: 137/81  Pulse: 93  Resp: 18  Temp: (!) 96.8 F (36 C)   Filed Weights   10/18/19 0943  Weight: 154 lb 8 oz (70.1 kg)    Physical Exam Constitutional:      General: He is not in acute distress. HENT:     Head: Normocephalic and atraumatic.  Eyes:     General: No scleral icterus. Cardiovascular:     Rate and Rhythm: Normal rate and regular rhythm.     Heart sounds: Normal heart sounds.  Pulmonary:     Effort: Pulmonary effort is normal. No respiratory distress.     Breath sounds: No wheezing.  Abdominal:     General: Bowel sounds are normal. There is no distension.     Palpations: Abdomen is soft.  Musculoskeletal:        General: No deformity. Normal range of motion.     Cervical back: Normal range of motion and neck supple.  Skin:    General: Skin is warm and dry.     Findings: No erythema or rash.  Neurological:     Mental Status: He is alert and oriented to person, place, and time. Mental status is at baseline.     Cranial Nerves: No cranial nerve deficit.     Coordination: Coordination normal.  Psychiatric:        Mood and Affect: Mood normal.        CMP Latest Ref Rng & Units 09/30/2019  Glucose 65 - 99 mg/dL 104(H)  BUN 7 - 25 mg/dL 6(L)  Creatinine 0.70 - 1.33 mg/dL 0.61(L)  Sodium 135 - 146 mmol/L 137  Potassium 3.5 - 5.3 mmol/L 4.1  Chloride 98 - 110 mmol/L 100  CO2 20 - 32 mmol/L 25  Calcium 8.6 - 10.3 mg/dL 9.3  Total Protein 6.5 - 8.1 g/dL -  Total Bilirubin 0.3 - 1.2 mg/dL -  Alkaline Phos 38 - 126 U/L -  AST 15 - 41 U/L -  ALT 0 - 44 U/L -   CBC Latest Ref Rng & Units 10/15/2019  WBC 3.4 - 10.8 x10E3/uL 5.7  Hemoglobin 13.0 - 17.7 g/dL 10.8(L)  Hematocrit 37.5 - 51.0 % 34.0(L)  Platelets 150 - 450 x10E3/uL 348     LABORATORY DATA:  I have reviewed the data as listed Lab Results  Component Value Date   WBC 5.7 10/15/2019   HGB 10.8 (L) 10/15/2019   HCT 34.0 (L) 10/15/2019   MCV 93 10/15/2019   PLT 348 10/15/2019   Recent Labs    07/01/19 2038 07/01/19 2038 09/19/19 1002 09/19/19 1824 09/30/19 1008  NA 130*   < > 134* 135 137  K 3.1*   < > 3.6 3.4* 4.1  CL 94*   < > 100 103 100  CO2 21*   < > 23 22 25   GLUCOSE 108*   < > 97 209* 104*  BUN 7   < > 6 7 6*  CREATININE 0.50*   < > 0.57* 0.64 0.61*  CALCIUM 8.8*   < > 8.6* 8.4* 9.3  GFRNONAA >60   < > >60 >60 111  GFRAA >60   < > >60 >60 128  PROT 7.3  --  7.4 6.3*  --   ALBUMIN 4.2  --  3.9 3.5  --   AST 62*  --  37 30  --   ALT 45*  --  29 24  --   ALKPHOS 49  --  55 46  --   BILITOT 1.0  --  0.5  0.7  --    < > = values in this interval not displayed.   Iron/TIBC/Ferritin/ %Sat    Component Value Date/Time   IRON 14 (L) 10/15/2019 1024   TIBC 506 (H) 10/15/2019 1024   FERRITIN 19 (L) 10/15/2019 1024   IRONPCTSAT 3 (LL) 10/15/2019 1024     RADIOGRAPHIC STUDIES: I have personally reviewed the radiological images as listed and agreed with the findings in the report. CT ABDOMEN PELVIS W CONTRAST  Result Date: 09/19/2019 CLINICAL DATA:  Constipated for several days. Took a laxative yesterday. Five bloody stools today. History of  diverticulitis. Abdominal crampy pain. EXAM: CT ABDOMEN AND PELVIS WITH CONTRAST TECHNIQUE: Multidetector CT imaging of the abdomen and pelvis was performed using the standard protocol following bolus administration of intravenous contrast. CONTRAST:  148mL OMNIPAQUE IOHEXOL 300 MG/ML  SOLN COMPARISON:  07/01/2019 FINDINGS: Lower chest: Clear lung bases.  Heart normal in size. Hepatobiliary: Liver normal in size. Overall decreased liver attenuation consistent with fatty infiltration. No liver mass or focal lesion. Normal gallbladder. No bile duct dilation. Pancreas: Unremarkable. No pancreatic ductal dilatation or surrounding inflammatory changes. Spleen: Normal in size without focal abnormality. Adrenals/Urinary Tract: Adrenal glands are unremarkable. Kidneys are normal, without renal calculi, focal lesion, or hydronephrosis. Bladder is unremarkable. Stomach/Bowel: Subtle inflammation adjacent to the proximal sigmoid colon, centered on an ill-defined diverticulum. No extraluminal air. No fluid collection to suggest an abscess. This portion of the sigmoid colon shows mild wall thickening. There are numerous diverticula throughout the remainder of the colon, mostly on the left. No other evidence of colonic inflammation. Small to moderate hiatal hernia, stable. Stomach otherwise unremarkable. Normal small bowel. Normal appendix. Vascular/Lymphatic: Aortic atherosclerosis. No aneurysm. No enlarged lymph nodes. Reproductive: Enlarged prostate measuring 5.0 x 3.3 x 3.4 cm. Other: No abdominal wall hernia or abnormality. No abdominopelvic ascites. Musculoskeletal: No fracture or acute finding. No osteoblastic or osteolytic lesions. IMPRESSION: 1. Mild diverticulitis of the proximal sigmoid colon. No complication. Specifically, no evidence of extraluminal air or abscess. 2. No other acute abnormality within the abdomen or pelvis. 3. Hepatic steatosis. 4. Small to moderate hiatal hernia. 5. Aortic atherosclerosis. 6.  Colonic diverticulosis. Electronically Signed   By: Lajean Manes M.D.   On: 09/19/2019 15:14       ASSESSMENT & PLAN:  1. Iron deficiency anemia, unspecified iron deficiency anemia type    Labs are reviewed and discussed with patient. Consistent with iron deficiency anemia. Patient has chronic gastroenterology symptoms pending work-up.  I discussed about oral iron supplementation option and he is afraid of further worsening his GI symptoms.  Alternative IV iron treatment option was discussed. Plan IV iron with Venofer 200mg  weekly x 4 doses. Allergy reactions/infusion reaction including anaphylactic reaction discussed with patient. Other side effects include but not limited to high blood pressure, skin rash, weight gain, leg swelling, etc. Patient voices understanding and willing to proceed.  Orders Placed This Encounter  Procedures  . CBC with Differential/Platelet    Standing Status:   Future    Standing Expiration Date:   10/17/2020  . Ferritin    Standing Status:   Future    Standing Expiration Date:   10/17/2020  . Iron and TIBC    Standing Status:   Future    Standing Expiration Date:   10/17/2020    All questions were answered. The patient knows to call the clinic with any problems questions or concerns.  Cc Lin Landsman, MD  Return of visit: 10 weeks  Thank you for this kind referral and the opportunity to participate in the care of this patient. A copy of today's note is routed to referring provider   Earlie Server, MD, PhD Hematology Oncology [place] [facility] 10/18/2019

## 2019-10-18 NOTE — Progress Notes (Signed)
Pt here to establish care for IDA.

## 2019-10-20 ENCOUNTER — Other Ambulatory Visit: Payer: Self-pay

## 2019-10-20 ENCOUNTER — Encounter: Payer: Self-pay | Admitting: Emergency Medicine

## 2019-10-20 ENCOUNTER — Emergency Department
Admission: EM | Admit: 2019-10-20 | Discharge: 2019-10-20 | Disposition: A | Discharge status: Home / Self Care | Payer: Commercial Managed Care - PPO | Attending: Emergency Medicine | Admitting: Emergency Medicine

## 2019-10-20 DIAGNOSIS — R531 Weakness: Secondary | ICD-10-CM | POA: Diagnosis not present

## 2019-10-20 DIAGNOSIS — Z5321 Procedure and treatment not carried out due to patient leaving prior to being seen by health care provider: Secondary | ICD-10-CM | POA: Diagnosis not present

## 2019-10-20 DIAGNOSIS — R1032 Left lower quadrant pain: Secondary | ICD-10-CM | POA: Insufficient documentation

## 2019-10-20 LAB — CBC
HCT: 31.2 % — ABNORMAL LOW (ref 39.0–52.0)
Hemoglobin: 10.5 g/dL — ABNORMAL LOW (ref 13.0–17.0)
MCH: 28.9 pg (ref 26.0–34.0)
MCHC: 33.7 g/dL (ref 30.0–36.0)
MCV: 86 fL (ref 80.0–100.0)
Platelets: 354 10*3/uL (ref 150–400)
RBC: 3.63 MIL/uL — ABNORMAL LOW (ref 4.22–5.81)
RDW: 15 % (ref 11.5–15.5)
WBC: 6.1 10*3/uL (ref 4.0–10.5)
nRBC: 0 % (ref 0.0–0.2)

## 2019-10-20 LAB — URINALYSIS, COMPLETE (UACMP) WITH MICROSCOPIC
Bacteria, UA: NONE SEEN
Bilirubin Urine: NEGATIVE
Glucose, UA: NEGATIVE mg/dL
Hgb urine dipstick: NEGATIVE
Ketones, ur: NEGATIVE mg/dL
Leukocytes,Ua: NEGATIVE
Nitrite: NEGATIVE
Protein, ur: NEGATIVE mg/dL
Specific Gravity, Urine: 1.001 — ABNORMAL LOW (ref 1.005–1.030)
Squamous Epithelial / HPF: NONE SEEN (ref 0–5)
pH: 5 (ref 5.0–8.0)

## 2019-10-20 LAB — COMPREHENSIVE METABOLIC PANEL
ALT: 46 U/L — ABNORMAL HIGH (ref 0–44)
AST: 74 U/L — ABNORMAL HIGH (ref 15–41)
Albumin: 4.2 g/dL (ref 3.5–5.0)
Alkaline Phosphatase: 52 U/L (ref 38–126)
Anion gap: 13 (ref 5–15)
BUN: <5 mg/dL — ABNORMAL LOW (ref 6–20)
CO2: 22 mmol/L (ref 22–32)
Calcium: 8.7 mg/dL — ABNORMAL LOW (ref 8.9–10.3)
Chloride: 97 mmol/L — ABNORMAL LOW (ref 98–111)
Creatinine, Ser: 0.58 mg/dL — ABNORMAL LOW (ref 0.61–1.24)
GFR calc Af Amer: >60 mL/min (ref >60)
GFR calc non Af Amer: >60 mL/min (ref >60)
Glucose, Bld: 100 mg/dL — ABNORMAL HIGH (ref 70–99)
Potassium: 3.6 mmol/L (ref 3.5–5.1)
Sodium: 132 mmol/L — ABNORMAL LOW (ref 135–145)
Total Bilirubin: 0.7 mg/dL (ref 0.3–1.2)
Total Protein: 7.7 g/dL (ref 6.5–8.1)

## 2019-10-20 LAB — LIPASE, BLOOD: Lipase: 65 U/L — ABNORMAL HIGH (ref 11–51)

## 2019-10-20 NOTE — ED Triage Notes (Signed)
Pt here for NVD. Finished abx for diverticulitis but not feeling any better.  Still having LLQ pain. Also c/o weakness that is not new. Supposed to start iron infusions but has not yet.  Weakness from the anemia per pt. Feeling dizzy because so weak.  No blood in stool currently.

## 2019-10-21 ENCOUNTER — Telehealth: Payer: Self-pay | Admitting: Emergency Medicine

## 2019-10-21 ENCOUNTER — Other Ambulatory Visit: Payer: Self-pay

## 2019-10-21 ENCOUNTER — Ambulatory Visit: Payer: Commercial Managed Care - PPO | Attending: Internal Medicine

## 2019-10-21 DIAGNOSIS — Z20822 Contact with and (suspected) exposure to covid-19: Secondary | ICD-10-CM

## 2019-10-21 LAB — GI PROFILE, STOOL, PCR

## 2019-10-21 MED ORDER — CLENPIQ 10-3.5-12 MG-GM -GM/160ML PO SOLN: 2.0000 | Freq: Once | ORAL | Status: AC

## 2019-10-21 MED ORDER — CLENPIQ 10-3.5-12 MG-GM -GM/160ML PO SOLN: 2.0000 | Freq: Once | ORAL | Status: DC

## 2019-10-21 NOTE — Telephone Encounter (Addendum)
Called patient due to lwot to inquire about condition and follow up plans. Left message.   Patient called me back.  Says he will follow with his doctor.  Says they have tests set up for him already.  I will message dr Servando Salina as well.

## 2019-10-21 NOTE — Progress Notes (Unsigned)
cl

## 2019-10-22 ENCOUNTER — Telehealth: Payer: Self-pay

## 2019-10-22 ENCOUNTER — Encounter: Payer: Self-pay | Admitting: Gastroenterology

## 2019-10-22 LAB — SARS-COV-2, NAA 2 DAY TAT

## 2019-10-22 LAB — NOVEL CORONAVIRUS, NAA: SARS-CoV-2, NAA: NOT DETECTED

## 2019-10-22 MED ORDER — FUSION PLUS PO CAPS: 1.0000 | ORAL_CAPSULE | Freq: Every day | ORAL | 1 refills | Status: DC

## 2019-10-22 NOTE — Telephone Encounter (Signed)
Per LuAnn, patient insurance may take up to 15 days to review and approve Venofer. Pt originally scheduled for 1st dose of venofer on 5/26. Venofer appts moved out 2 weeks to allow time for insurance to approved. Pt requests to have venfer sooner as he is feeling weak and ready to go back to work. If approval comes sooner than scheduled, venofer appts can be moved up.

## 2019-10-22 NOTE — Telephone Encounter (Signed)
Informed patient of this information. Patient states his company fax [place] the [facility] informed patient to tell them to refax it and he states he will bring tomorrow what they have sent him. He would like to take the fusion plus sent this to the pharmacy

## 2019-10-23 ENCOUNTER — Telehealth: Payer: Self-pay

## 2019-10-23 ENCOUNTER — Inpatient Hospital Stay: Payer: Commercial Managed Care - PPO

## 2019-10-23 NOTE — Telephone Encounter (Signed)
Filled FMAL out for patient and Fax to company. Informed patient that we faxed forms and he will come pick up a copy of the forms

## 2019-10-23 NOTE — Telephone Encounter (Signed)
Patient dropped off FMAL forms to be filled out. Please help me fill out

## 2019-10-23 NOTE — Telephone Encounter (Signed)
Agree, Let us fill the forms for 3 weeks only at this time  RV

## 2019-10-29 ENCOUNTER — Other Ambulatory Visit
Admission: RE | Admit: 2019-10-29 | Discharge: 2019-10-29 | Disposition: A | Discharge status: Home / Self Care | Payer: Commercial Managed Care - PPO | Source: Ambulatory Visit | Attending: Gastroenterology | Admitting: Gastroenterology

## 2019-10-29 ENCOUNTER — Other Ambulatory Visit: Payer: Self-pay

## 2019-10-29 ENCOUNTER — Ambulatory Visit: Payer: Commercial Managed Care - PPO

## 2019-10-29 DIAGNOSIS — Z01812 Encounter for preprocedural laboratory examination: Secondary | ICD-10-CM | POA: Insufficient documentation

## 2019-10-29 DIAGNOSIS — Z20822 Contact with and (suspected) exposure to covid-19: Secondary | ICD-10-CM | POA: Diagnosis not present

## 2019-10-30 LAB — SARS CORONAVIRUS 2 (TAT 6-24 HRS): SARS Coronavirus 2: NEGATIVE

## 2019-10-31 ENCOUNTER — Ambulatory Visit
Admission: RE | Admit: 2019-10-31 | Discharge: 2019-10-31 | Disposition: A | Discharge status: Home / Self Care | Payer: Commercial Managed Care - PPO | Attending: Gastroenterology | Admitting: Gastroenterology

## 2019-10-31 ENCOUNTER — Other Ambulatory Visit: Payer: Self-pay

## 2019-10-31 ENCOUNTER — Ambulatory Visit: Payer: Commercial Managed Care - PPO | Admitting: Anesthesiology

## 2019-10-31 ENCOUNTER — Encounter
Admission: RE | Disposition: A | Discharge status: Home / Self Care | Payer: Self-pay | Source: Home / Self Care | Attending: Gastroenterology

## 2019-10-31 ENCOUNTER — Encounter: Payer: Self-pay | Admitting: Gastroenterology

## 2019-10-31 DIAGNOSIS — Z8711 Personal history of peptic ulcer disease: Secondary | ICD-10-CM | POA: Diagnosis not present

## 2019-10-31 DIAGNOSIS — Z8249 Family history of ischemic heart disease and other diseases of the circulatory system: Secondary | ICD-10-CM | POA: Insufficient documentation

## 2019-10-31 DIAGNOSIS — Z79899 Other long term (current) drug therapy: Secondary | ICD-10-CM | POA: Diagnosis not present

## 2019-10-31 DIAGNOSIS — K635 Polyp of colon: Secondary | ICD-10-CM | POA: Diagnosis not present

## 2019-10-31 DIAGNOSIS — D509 Iron deficiency anemia, unspecified: Secondary | ICD-10-CM | POA: Insufficient documentation

## 2019-10-31 DIAGNOSIS — D125 Benign neoplasm of sigmoid colon: Secondary | ICD-10-CM | POA: Diagnosis not present

## 2019-10-31 DIAGNOSIS — Z886 Allergy status to analgesic agent status: Secondary | ICD-10-CM | POA: Diagnosis not present

## 2019-10-31 DIAGNOSIS — R1032 Left lower quadrant pain: Secondary | ICD-10-CM | POA: Diagnosis present

## 2019-10-31 DIAGNOSIS — K5730 Diverticulosis of large intestine without perforation or abscess without bleeding: Secondary | ICD-10-CM | POA: Diagnosis not present

## 2019-10-31 DIAGNOSIS — Z8619 Personal history of other infectious and parasitic diseases: Secondary | ICD-10-CM | POA: Insufficient documentation

## 2019-10-31 DIAGNOSIS — R197 Diarrhea, unspecified: Secondary | ICD-10-CM

## 2019-10-31 DIAGNOSIS — R1013 Epigastric pain: Secondary | ICD-10-CM | POA: Diagnosis not present

## 2019-10-31 DIAGNOSIS — I10 Essential (primary) hypertension: Secondary | ICD-10-CM | POA: Insufficient documentation

## 2019-10-31 DIAGNOSIS — K529 Noninfective gastroenteritis and colitis, unspecified: Secondary | ICD-10-CM | POA: Insufficient documentation

## 2019-10-31 DIAGNOSIS — D124 Benign neoplasm of descending colon: Secondary | ICD-10-CM | POA: Insufficient documentation

## 2019-10-31 DIAGNOSIS — K219 Gastro-esophageal reflux disease without esophagitis: Secondary | ICD-10-CM | POA: Diagnosis not present

## 2019-10-31 DIAGNOSIS — Z87891 Personal history of nicotine dependence: Secondary | ICD-10-CM | POA: Diagnosis not present

## 2019-10-31 DIAGNOSIS — K644 Residual hemorrhoidal skin tags: Secondary | ICD-10-CM | POA: Diagnosis not present

## 2019-10-31 HISTORY — PX: COLONOSCOPY WITH PROPOFOL: SHX5780

## 2019-10-31 HISTORY — PX: ESOPHAGOGASTRODUODENOSCOPY (EGD) WITH PROPOFOL: SHX5813

## 2019-10-31 SURGERY — COLONOSCOPY WITH PROPOFOL
Anesthesia: General

## 2019-10-31 MED ORDER — PROPOFOL 10 MG/ML IV BOLUS
INTRAVENOUS | Status: DC | PRN
  Administered 2019-10-31 (×2): 10 mg via INTRAVENOUS
  Administered 2019-10-31: 20 mg via INTRAVENOUS
  Administered 2019-10-31 (×3): 10 mg via INTRAVENOUS
  Administered 2019-10-31 (×2): 20 mg via INTRAVENOUS
  Administered 2019-10-31: 10 mg via INTRAVENOUS
  Administered 2019-10-31: 40 mg via INTRAVENOUS

## 2019-10-31 MED ORDER — GLYCOPYRROLATE 0.2 MG/ML IJ SOLN
INTRAMUSCULAR | Status: DC | PRN
  Administered 2019-10-31: .1 mg via INTRAVENOUS

## 2019-10-31 MED ORDER — LIDOCAINE HCL (PF) 2 % IJ SOLN
INTRAMUSCULAR | Status: DC | PRN
  Administered 2019-10-31: 25 mg via INTRADERMAL

## 2019-10-31 MED ORDER — LIDOCAINE HCL (PF) 2 % IJ SOLN
INTRAMUSCULAR | Status: AC
  Filled 2019-10-31: qty 10

## 2019-10-31 MED ORDER — PROPOFOL 10 MG/ML IV BOLUS
INTRAVENOUS | Status: AC
  Filled 2019-10-31: qty 20

## 2019-10-31 MED ORDER — PROPOFOL 500 MG/50ML IV EMUL
INTRAVENOUS | Status: AC
  Filled 2019-10-31: qty 50

## 2019-10-31 MED ORDER — SODIUM CHLORIDE 0.9 % IV SOLN
INTRAVENOUS | Status: DC
  Administered 2019-10-31: 1000 mL via INTRAVENOUS

## 2019-10-31 MED ORDER — PROPOFOL 500 MG/50ML IV EMUL
INTRAVENOUS | Status: DC | PRN
  Administered 2019-10-31: 75 ug/kg/min via INTRAVENOUS

## 2019-10-31 MED ORDER — GLYCOPYRROLATE 0.2 MG/ML IJ SOLN
INTRAMUSCULAR | Status: AC
  Filled 2019-10-31: qty 1

## 2019-10-31 NOTE — Op Note (Signed)
[facility] Patient Name: Luis Porter Procedure Date: 10/31/2019 9:31 AM MRN: [id number] Account #: [id number] Date of Birth: Mar 29, 1961 Admit Type: Outpatient Age: 59 Room: [facility] ENDO ROOM 3 Gender: Male Note Status: Finalized Procedure:             Colonoscopy Indications:           Last colonoscopy: August 2018, Abdominal pain in the                         left lower quadrant, Chronic diarrhea, Iron deficiency                         anemia Providers:             Lin Landsman MD, MD Referring MD:          Olin Hauser (Referring MD) Medicines:             Monitored Anesthesia Care Complications:         No immediate complications. Estimated blood loss: None. Procedure:             Pre-Anesthesia Assessment:                        - Prior to the procedure, a History and Physical was                         performed, and patient medications and allergies were                         reviewed. The patient is competent. The risks and                         benefits of the procedure and the sedation options and                         risks were discussed with the patient. All questions                         were answered and informed consent was obtained.                         Patient identification and proposed procedure were                         verified by the physician, the nurse, the                         anesthesiologist, the anesthetist and the technician                         in the pre-procedure area in the procedure room in the                         endoscopy suite. Mental Status Examination: alert and                         oriented. Airway Examination: normal oropharyngeal  airway and neck mobility. Respiratory Examination:                         clear to auscultation. CV Examination: normal.                         Prophylactic Antibiotics: The patient does not require                  prophylactic antibiotics. Prior Anticoagulants: The                         patient has taken no previous anticoagulant or                         antiplatelet agents. ASA Grade Assessment: II - A                         patient with mild systemic disease. After reviewing                         the risks and benefits, the patient was deemed in                         satisfactory condition to undergo the procedure. The                         anesthesia plan was to use monitored anesthesia care                         (MAC). Immediately prior to administration of                         medications, the patient was re-assessed for adequacy                         to receive sedatives. The heart rate, respiratory                         rate, oxygen saturations, blood pressure, adequacy of                         pulmonary ventilation, and response to care were                         monitored throughout the procedure. The physical                         status of the patient was re-assessed after the                         procedure.                        After obtaining informed consent, the colonoscope was                         passed under direct vision. Throughout the procedure,  the patient's blood pressure, pulse, and oxygen                         saturations were monitored continuously. The                         Colonoscope was introduced through the anus and                         advanced to the the terminal ileum, with                         identification of the appendiceal orifice and IC                         valve. The colonoscopy was performed without                         difficulty. The patient tolerated the procedure well.                         The quality of the bowel preparation was fair. Findings:      Hemorrhoids were found on perianal exam.      The terminal ileum appeared normal.      Normal mucosa was found in  the entire colon. Biopsies for histology were       taken with a cold forceps from the entire colon for evaluation of       microscopic colitis.      Two sessile polyps were found in the sigmoid colon and descending colon.       The polyps were 3 to 5 mm in size. These polyps were removed with a cold       snare. Resection and retrieval were complete.      Multiple diverticula were found in the sigmoid colon. There was no       evidence of diverticular bleeding.      Non-bleeding external hemorrhoids were found during retroflexion. The       hemorrhoids were medium-sized. Impression:            - Preparation of the colon was fair.                        - Hemorrhoids found on perianal exam.                        - The examined portion of the ileum was normal.                        - Normal mucosa in the entire examined colon. Biopsied.                        - Two 3 to 5 mm polyps in the sigmoid colon and in the                         descending colon, removed with a cold snare. Resected                         and retrieved.                        -  Severe diverticulosis in the sigmoid colon. There                         was no evidence of diverticular bleeding.                        - Non-bleeding external hemorrhoids. Recommendation:        - Discharge patient to home (with escort).                        - Resume previous diet today.                        - Continue present medications.                        - Await pathology results.                        - Repeat colonoscopy in 3 years for surveillance.                        - Return to my office as previously scheduled. Procedure Code(s):     --- Professional ---                        [phone], Colonoscopy, flexible; with removal of                         tumor(s), polyp(s), or other lesion(s) by snare                         technique                        45380, 80, Colonoscopy, flexible; with biopsy, single                          or multiple Diagnosis Code(s):     --- Professional ---                        K64.4, Residual hemorrhoidal skin tags                        K63.5, Polyp of colon                        R10.32, Left lower quadrant pain                        K52.9, Noninfective gastroenteritis and colitis,                         unspecified                        K57.30, Diverticulosis of large intestine without                         perforation or abscess without bleeding  D50.9, Iron deficiency anemia, unspecified CPT copyright 2019 American Medical Association. All rights reserved. The codes documented in this report are preliminary and upon coder review may  be revised to meet current compliance requirements. Dr. Ulyess Mort Lin Landsman MD, MD 10/31/2019 10:24:48 AM This report has been signed electronically. Number of Addenda: 0 Note Initiated On: 10/31/2019 9:31 AM Scope Withdrawal Time: 0 hours 12 minutes 42 seconds  Total Procedure Duration: 0 hours 15 minutes 18 seconds  Estimated Blood Loss:  Estimated blood loss: none.      [facility]

## 2019-10-31 NOTE — Transfer of Care (Signed)
Immediate Anesthesia Transfer of Care Note  Patient: Luis Porter  Procedure(s) Performed: COLONOSCOPY WITH PROPOFOL (N/A ) ESOPHAGOGASTRODUODENOSCOPY (EGD) WITH PROPOFOL (N/A )  Patient Location: PACU  Anesthesia Type:General  Level of Consciousness: awake, alert  and oriented  Airway & Oxygen Therapy: Patient Spontanous Breathing and Patient connected to nasal cannula oxygen  Post-op Assessment: Report given to RN and Post -op Vital signs reviewed and stable  Post vital signs: Reviewed and stable  Last Vitals:  Vitals Value Taken Time  BP 120/86 10/31/19 1027  Temp    Pulse 84 10/31/19 1028  Resp 17 10/31/19 1028  SpO2 97 % 10/31/19 1028  Vitals shown include unvalidated device data.  Last Pain:  Vitals:   10/31/19 0910  TempSrc: Temporal  PainSc: 0-No pain         Complications: No apparent anesthesia complications

## 2019-10-31 NOTE — H&P (Signed)
Luis Darby, MD [address]  [place]  [place], [place] [address]  Main: [phone]  Fax: [phone] Pager: [phone]  Primary Care Physician:  Luis Hauser, DO Primary Gastroenterologist:  Dr. Cephas Porter  Pre-Procedure History & Physical: HPI:  Luis Porter is a 59 y.o. male is here for an endoscopy and colonoscopy.   Past Medical History:  Diagnosis Date  . Diverticulosis   . Gastric ulcer   . GERD (gastroesophageal reflux disease)   . History of hiatal hernia   . Hypertension   . Iron deficiency anemia 10/18/2019  . 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 BACK SURGERY    . COLONOSCOPY WITH PROPOFOL N/A 12/30/2016   Procedure: COLONOSCOPY WITH PROPOFOL;  Surgeon: Luis Bellows, MD;  Location: [facility] ENDOSCOPY;  Service: Endoscopy;  Laterality: N/A;  . ESOPHAGOGASTRODUODENOSCOPY (EGD) WITH PROPOFOL N/A 12/30/2016   Procedure: ESOPHAGOGASTRODUODENOSCOPY (EGD) WITH PROPOFOL;  Surgeon: Luis Bellows, MD;  Location: [facility] ENDOSCOPY;  Service: Endoscopy;  Laterality: N/A;  . HERNIA REPAIR  [id number]   Umbilical Hernia Repair  . LUMBAR LAMINECTOMY/DECOMPRESSION MICRODISCECTOMY Right 05/18/2016   Procedure: right L4-5 microdiscectomy;  Surgeon: Luis East, MD;  Location: [facility] ORS;  Service: Neurosurgery;  Laterality: Right;  . TONSILLECTOMY    . UMBILICAL HERNIA REPAIR N/A 06/29/2018   Procedure: LAPAROSCOPIC REPAIR OF RECURRENT UMBILICAL HERNIA WITH MESH;  Surgeon: Luis Epley, MD;  Location: [facility] ORS;  Service: General;  Laterality: N/A;    Prior to Admission medications   Medication Sig Start Date End Date Taking? Authorizing Provider  amLODipine-benazepril (LOTREL) 10-40 MG capsule TAKE 1 CAPSULE BY MOUTH EVERY DAY 06/07/19  Yes Karamalegos, [name], DO  dicyclomine (BENTYL) 10 MG capsule Take 1 capsule (10 mg total) by mouth 4 (four)  times daily -  before meals and at bedtime. 10/07/19  Yes Karamalegos, [name], DO  Iron-FA-B Cmp-C-Biot-Probiotic (FUSION PLUS) CAPS Take 1 tablet by mouth daily. 10/22/19  Yes Masiya Claassen, Tally Due, MD  omeprazole (PRILOSEC) 40 MG capsule TAKE 1 CAPSULE BY MOUTH TWICE A DAY 06/07/19  Yes Karamalegos, Alexander J, DO  ondansetron (ZOFRAN ODT) 8 MG disintegrating tablet Take 1 tablet (8 mg total) by mouth every 8 (eight) hours as needed for nausea or vomiting. 10/03/19  Yes Karamalegos, [name], DO  promethazine (PHENERGAN) 25 MG tablet Take 0.5-1 tablets (12.5-25 mg total) by mouth every 8 (eight) hours as needed for nausea or vomiting. 10/03/19  Yes Karamalegos, [name], DO  Vitamins/Minerals TABS Take by mouth.   Yes [provider]  meclizine (ANTIVERT) 25 MG tablet Take 1 tablet (25 mg total) by mouth 3 (three) times daily as needed for dizziness or nausea. 10/03/19   Luis Hauser, DO    Allergies as of 10/15/2019 - Review Complete 10/15/2019  Allergen Reaction Noted  . Nsaids Other (See Comments) 05/09/2016    Family History  Problem Relation Age of Onset  . Hypertension Mother   . Cancer Mother        Ovarian  . Cervical cancer Mother   . Hypertension Father   . Cancer Father        tongue  . Throat cancer Father   . Hypertension Brother   . Prostate cancer Neg Hx   . Breast cancer Neg Hx   . Colon cancer Neg Hx   . Heart attack Neg Hx   .  Stroke Neg Hx     Social History   Socioeconomic History  . Marital status: Divorced    Spouse name: Not on file  . Number of children: Not on file  . Years of education: Not on file  . Highest education level: Not on file  Occupational History  . Not on file  Tobacco Use  . Smoking status: Former Smoker    Packs/day: 2.00    Years: 25.00    Pack years: 50.00    Types: Cigarettes    Quit date: 05/31/1987    Years since quitting: 32.4  . Smokeless tobacco: Never Used  Substance and Sexual Activity  .  Alcohol use: Yes    Comment: 8-10 beers per day  . Drug use: No  . Sexual activity: Yes    Comment: parners birth control  Other Topics Concern  . Not on file  Social History Narrative  . Not on file   Social Determinants of Health   Financial Resource Strain:   . Difficulty of Paying Living Expenses:   Food Insecurity:   . Worried About Charity fundraiser in the Last Year:   . Arboriculturist in the Last Year:   Transportation Needs:   . Film/video editor (Medical):   Luis Porter Lack of Transportation (Non-Medical):   Physical Activity:   . Days of Exercise per Week:   . Minutes of Exercise per Session:   Stress:   . Feeling of Stress :   Social Connections:   . Frequency of Communication with Friends and Family:   . Frequency of Social Gatherings with Friends and Family:   . Attends Religious Services:   . Active Member of Clubs or Organizations:   . Attends Archivist Meetings:   Luis Porter Marital Status:   Intimate Partner Violence:   . Fear of Current or Ex-Partner:   . Emotionally Abused:   Luis Porter Physically Abused:   . Sexually Abused:     Review of Systems: See HPI, otherwise negative ROS  Physical Exam: BP 120/83   Pulse 100   Temp 98.3 F (36.8 C) (Temporal)   Resp 18   Ht 5\' 6"  (1.676 m)   Wt 68.2 kg   SpO2 97%   BMI 24.26 kg/m  General:   Alert,  pleasant and cooperative in NAD Head:  Normocephalic and atraumatic. Neck:  Supple; no masses or thyromegaly. Lungs:  Clear throughout to auscultation.    Heart:  Regular rate and rhythm. Abdomen:  Soft, nontender and nondistended. Normal bowel sounds, without guarding, and without rebound.   Neurologic:  Alert and  oriented x4;  grossly normal neurologically.  Impression/Plan: Luis Porter is here for an endoscopy and colonoscopy to be performed for IDA, h/o recurrent sigmoid diverticulitis, epigastric pain  Risks, benefits, limitations, and alternatives regarding  endoscopy and colonoscopy have  been reviewed with the patient.  Questions have been answered.  All parties agreeable.   Luis Sear, MD  10/31/2019, 9:21 AM

## 2019-10-31 NOTE — Anesthesia Postprocedure Evaluation (Signed)
Anesthesia Post Note  Patient: Luis Porter  Procedure(s) Performed: COLONOSCOPY WITH PROPOFOL (N/A ) ESOPHAGOGASTRODUODENOSCOPY (EGD) WITH PROPOFOL (N/A )  Patient location during evaluation: PACU Anesthesia Type: General Level of consciousness: awake and alert Pain management: pain level controlled Vital Signs Assessment: post-procedure vital signs reviewed and stable Respiratory status: spontaneous breathing, nonlabored ventilation and respiratory function stable Cardiovascular status: blood pressure returned to baseline and stable Postop Assessment: no apparent nausea or vomiting Anesthetic complications: no     Last Vitals:  Vitals:   10/31/19 1030 10/31/19 1040  BP: 120/86 (!) 121/98  Pulse: 85 80  Resp: 17 14  Temp:    SpO2: 96% 98%    Last Pain:  Vitals:   10/31/19 1020  TempSrc: Temporal  PainSc:                  Tera Mater

## 2019-10-31 NOTE — Anesthesia Preprocedure Evaluation (Signed)
Anesthesia Evaluation  Patient identified by MRN, date of birth, ID band Patient awake    Reviewed: Allergy & Precautions, H&P , NPO status , Patient's Chart, lab work & pertinent test results  Airway Mallampati: II  TM Distance: >3 FB Neck ROM: full    Dental  (+) Implants   Pulmonary neg COPD, former smoker,    Pulmonary exam normal        Cardiovascular hypertension, Normal cardiovascular exam     Neuro/Psych negative neurological ROS  negative psych ROS   GI/Hepatic hiatal hernia, PUD, GERD  ,(+)     substance abuse  alcohol use,   Endo/Other  negative endocrine ROS  Renal/GU negative Renal ROS  negative genitourinary   Musculoskeletal   Abdominal   Peds  Hematology  (+) Blood dyscrasia, anemia ,   Anesthesia Other Findings Past Medical History: No date: Diverticulosis No date: Gastric ulcer No date: GERD (gastroesophageal reflux disease) No date: History of hiatal hernia No date: Hypertension 10/18/2019: Iron deficiency anemia 99991111: Recurrent umbilical hernia with incarceration     Comment:  Previously repaired x 1  Past Surgical History: No date: BACK SURGERY 12/30/2016: COLONOSCOPY WITH PROPOFOL; N/A     Comment:  Procedure: COLONOSCOPY WITH PROPOFOL;  Surgeon: [name], MD;  Location: [facility] ENDOSCOPY;  Service:               Endoscopy;  Laterality: N/A; 12/30/2016: ESOPHAGOGASTRODUODENOSCOPY (EGD) WITH PROPOFOL; N/A     Comment:  Procedure: ESOPHAGOGASTRODUODENOSCOPY (EGD) WITH               PROPOFOL;  Surgeon: [name], MD;  Location: [facility]               ENDOSCOPY;  Service: Endoscopy;  Laterality: N/A; 2002: HERNIA REPAIR     Comment:  Umbilical Hernia Repair [id number]: LUMBAR LAMINECTOMY/DECOMPRESSION MICRODISCECTOMY; Right     Comment:  Procedure: right L4-5 microdiscectomy;  Surgeon:               Blanche East, MD;  Location: [facility] ORS;  Service:   Neurosurgery;  Laterality: Right; No date: TONSILLECTOMY 0000000: UMBILICAL HERNIA REPAIR; N/A     Comment:  Procedure: LAPAROSCOPIC REPAIR OF RECURRENT UMBILICAL               HERNIA WITH MESH;  Surgeon: Vickie Epley, MD;                Location: [facility] ORS;  Service: General;  Laterality: N/A;  BMI    Body Mass Index: 24.26 kg/m      Reproductive/Obstetrics negative OB ROS                             Anesthesia Physical Anesthesia Plan  ASA: II  Anesthesia Plan: General   Post-op Pain Management:    Induction:   PONV Risk Score and Plan: Propofol infusion and TIVA  Airway Management Planned: Natural Airway and Nasal Cannula  Additional Equipment:   Intra-op Plan:   Post-operative Plan:   Informed Consent: I have reviewed the patients History and Physical, chart, labs and discussed the procedure including the risks, benefits and alternatives for the proposed anesthesia with the patient or authorized representative who has indicated his/her understanding and acceptance.     Dental Advisory Given  Plan Discussed with: Anesthesiologist, CRNA and Surgeon  Anesthesia Plan Comments:  Anesthesia Quick Evaluation  

## 2019-10-31 NOTE — Op Note (Signed)
[facility] Patient Name: Luis Porter Procedure Date: 10/31/2019 9:32 AM MRN: [id number] Account #: [id number] Date of Birth: [birth year] Admit Type: Outpatient Age: 59 Room: [facility] ENDO ROOM 3 Gender: Male Note Status: Finalized Procedure:             Upper GI endoscopy Indications:           Epigastric abdominal pain, Unexplained iron deficiency                         anemia Providers:             Lin Landsman MD, MD Referring MD:          Olin Hauser (Referring MD) Medicines:             Monitored Anesthesia Care Complications:         No immediate complications. Estimated blood loss: None. Procedure:             Pre-Anesthesia Assessment:                        - Prior to the procedure, a History and Physical was                         performed, and patient medications and allergies were                         reviewed. The patient is competent. The risks and                         benefits of the procedure and the sedation options and                         risks were discussed with the patient. All questions                         were answered and informed consent was obtained.                         Patient identification and proposed procedure were                         verified by the physician, the nurse, the                         anesthesiologist, the anesthetist and the technician                         in the pre-procedure area in the procedure room in the                         endoscopy suite. Mental Status Examination: alert and                         oriented. Airway Examination: normal oropharyngeal                         airway and neck mobility. Respiratory Examination:  clear to auscultation. CV Examination: normal.                         Prophylactic Antibiotics: The patient does not require                         prophylactic antibiotics. Prior Anticoagulants: The                        patient has taken no previous anticoagulant or                         antiplatelet agents. ASA Grade Assessment: III - A                         patient with severe systemic disease. After reviewing                         the risks and benefits, the patient was deemed in                         satisfactory condition to undergo the procedure. The                         anesthesia plan was to use monitored anesthesia care                         (MAC). Immediately prior to administration of                         medications, the patient was re-assessed for adequacy                         to receive sedatives. The heart rate, respiratory                         rate, oxygen saturations, blood pressure, adequacy of                         pulmonary ventilation, and response to care were                         monitored throughout the procedure. The physical                         status of the patient was re-assessed after the                         procedure.                        After obtaining informed consent, the endoscope was                         passed under direct vision. Throughout the procedure,                         the patient's blood pressure, pulse, and oxygen  saturations were monitored continuously. The Endoscope                         was introduced through the mouth, and advanced to the                         second part of duodenum. The upper GI endoscopy was                         extremely difficult due to the patient's oxygen                         desaturation. Successful completion of the procedure                         was aided by managing the patient's medical                         instability. The patient tolerated the procedure                         fairly well. Findings:      The esophagus, gastroesophageal junction and examined esophagus were       normal.      The stomach was normal.       The examined duodenum was normal.      The cardia and gastric fundus were normal on retroflexion. Impression:            - Normal esophagus.                        - Normal stomach.                        - Normal examined duodenum.                        - No specimens collected. Recommendation:        - Use Prilosec (omeprazole) 40 mg PO daily as he is                         actively drinking ETOH.                        - Abstinance from ETOH                        - Proceed with colonoscopy as scheduled                        See colonoscopy report Procedure Code(s):     --- Professional ---                        [phone], Esophagogastroduodenoscopy, flexible,                         transoral; diagnostic, including collection of                         specimen(s) by brushing or washing, when performed                         (  separate procedure) Diagnosis Code(s):     --- Professional ---                        R10.13, Epigastric pain                        D50.9, Iron deficiency anemia, unspecified CPT copyright 2019 American Medical Association. All rights reserved. The codes documented in this report are preliminary and upon coder review may  be revised to meet current compliance requirements. Dr. Ulyess Mort Lin Landsman MD, MD 10/31/2019 9:57:53 AM This report has been signed electronically. Number of Addenda: 0 Note Initiated On: 10/31/2019 9:32 AM Estimated Blood Loss:  Estimated blood loss: none. Estimated blood loss: none.      [facility]

## 2019-11-01 ENCOUNTER — Ambulatory Visit: Payer: Commercial Managed Care - PPO

## 2019-11-01 LAB — SURGICAL PATHOLOGY

## 2019-11-03 ENCOUNTER — Encounter: Payer: Self-pay | Admitting: Gastroenterology

## 2019-11-04 ENCOUNTER — Telehealth: Payer: Self-pay

## 2019-11-04 NOTE — Telephone Encounter (Signed)
Thanks. Let me know if you need anything on my end. I would agree with the current planned return to work date as well.  Nobie Putnam, [facility] 11/04/2019, 6:38 PM

## 2019-11-04 NOTE — Telephone Encounter (Signed)
Patient is calling because he is supposed to go back to work on 11/11/2019. He states that at his colonoscopy you had mention a FMAL for 6 weeks  FMAL. Please advised if we need to extend his [facility]

## 2019-11-04 NOTE — Telephone Encounter (Signed)
Luis Porter  We do not need to extend his FMLA from GI standpoint.  [name] [employer]

## 2019-11-05 NOTE — Telephone Encounter (Signed)
Patient verbalized understanding of recommendation. He states he agrees he will return to work on 11/11/2019

## 2019-11-06 ENCOUNTER — Other Ambulatory Visit: Payer: Self-pay

## 2019-11-06 ENCOUNTER — Inpatient Hospital Stay: Payer: Commercial Managed Care - PPO | Attending: Oncology

## 2019-11-06 VITALS — BP 130/75 | HR 96 | Temp 97.9°F | Resp 18

## 2019-11-06 DIAGNOSIS — D509 Iron deficiency anemia, unspecified: Secondary | ICD-10-CM | POA: Diagnosis not present

## 2019-11-06 MED ORDER — SODIUM CHLORIDE 0.9 % IV SOLN: 200.0000 mg | Freq: Once | INTRAVENOUS | Status: DC

## 2019-11-06 MED ORDER — SODIUM CHLORIDE 0.9 % IV SOLN
Freq: Once | INTRAVENOUS | Status: AC
  Filled 2019-11-06: qty 250

## 2019-11-06 MED ORDER — IRON SUCROSE 20 MG/ML IV SOLN
200.0000 mg | Freq: Once | INTRAVENOUS | Status: AC
  Administered 2019-11-06: 200 mg via INTRAVENOUS
  Filled 2019-11-06: qty 10

## 2019-11-08 ENCOUNTER — Ambulatory Visit: Payer: Commercial Managed Care - PPO

## 2019-11-12 ENCOUNTER — Other Ambulatory Visit: Payer: Self-pay

## 2019-11-12 ENCOUNTER — Ambulatory Visit (INDEPENDENT_AMBULATORY_CARE_PROVIDER_SITE_OTHER): Payer: Commercial Managed Care - PPO | Admitting: Gastroenterology

## 2019-11-12 ENCOUNTER — Encounter: Payer: Self-pay | Admitting: Gastroenterology

## 2019-11-12 VITALS — BP 130/78 | HR 97 | Temp 98.1°F | Ht 67.0 in | Wt 156.0 lb

## 2019-11-12 DIAGNOSIS — D509 Iron deficiency anemia, unspecified: Secondary | ICD-10-CM

## 2019-11-12 DIAGNOSIS — F1020 Alcohol dependence, uncomplicated: Secondary | ICD-10-CM | POA: Diagnosis not present

## 2019-11-12 DIAGNOSIS — R1013 Epigastric pain: Secondary | ICD-10-CM

## 2019-11-12 MED ORDER — GABAPENTIN 100 MG PO CAPS: 100.0000 mg | ORAL_CAPSULE | Freq: Three times a day (TID) | ORAL | 2 refills | Status: DC

## 2019-11-12 NOTE — Progress Notes (Signed)
Luis Darby, MD [address]  [place], [place] [address]  Main: [phone]  Fax: [phone]    Gastroenterology Consultation  Referring Provider:     Nobie Putnam * Primary Care Physician:  Olin Hauser, DO Primary Gastroenterologist:  Dr. Vicente Males Reason for Consultation:     History of acute sigmoid diverticulitis, lower abdominal pain and increased bowel frequency, alcohol dependence        HPI:   Arthuro Canelo Boggus is a 59 y.o. male referred by Dr. Parks Ranger, [name], DO  for consultation & management of approximately 1 month history of lower abdominal pain, worse in left lower quadrant associated with 2-3 soft bowel movements daily.  Patient had recent episode of acute sigmoid diverticulitis, admitted to [facility] end of April 2021, CT confirmed presence of mild diverticulitis of the proximal sigmoid colon, uncomplicated.  This was treated with Augmentin.  Patient had bloody diarrhea during this event.  Since that admission, he did not have any episodes of rectal bleeding.  Patient reports having another event in 05/2019.  He acknowledges drinking alcohol, 6-7 beers per day for several years.  He does report fatigue, lower abdominal cramps as well as epigastric pain.  He denies melena.  He is currently on omeprazole 40 mg twice daily He also reports weight loss, unable to go to work due to bowel urgency and abdominal cramps postprandial  Follow-up visit 11/12/2019 Patient reports doing significantly better since last visit.  He underwent upper endoscopy as well as colonoscopy.  Upper endoscopy was unremarkable, colonoscopy revealed benign polyps, random colon biopsies were normal.  He has cut down on alcohol to 5 beers per day from 12 pack/day.  He is currently on omeprazole 40 mg 2 times daily with significantly relieves his dyspepsia symptoms.  He is also followed by Dr. Tasia Catchings, hematologist for IV iron.  He received first infusion on 6/9.  He  reports feeling better but has headaches, palpitations, jitteriness, likely alcohol withdrawal symptoms.  He denies any black stools.  His anemia is improving.  He is requesting return to work letter  NSAIDs: None  Antiplts/Anticoagulants/Anti thrombotics: None  GI Procedures:  EGD and colonoscopy 10/31/2019 EGD was unremarkable  Colonoscopy - Preparation of the colon was fair. - Hemorrhoids found on perianal exam. - The examined portion of the ileum was normal. - Normal mucosa in the entire examined colon. Biopsied. - Two 3 to 5 mm polyps in the sigmoid colon and in the descending colon, removed with a cold snare. Resected and retrieved. - Severe diverticulosis in the sigmoid colon. There was no evidence of diverticular bleeding. - Non-bleeding external hemorrhoids.  DIAGNOSIS:  A. COLON, RANDOM; BIOPSIES:  - BENIGN COLONIC MUCOSA, NEGATIVE FOR ACTIVE MUCOSAL COLITIS,  LYMPHOCYTIC/MICROSCOPIC COLITIS, GRANULOMA AND DYSPLASIA.   B. COLON POLYP, DESCENDING; BIOPSY:  - TUBULAR ADENOMA AND FRAGMENTS OF BENIGN COLONIC MUCOSA.  - NEGATIVE FOR HIGH-GRADE DYSPLASIA AND MALIGNANCY.   C. COLON POLYP, SIGMOID, BIOPSY:  - TUBULAR ADENOMA AND FRAGMENTS OF BENIGN COLONIC MUCOSA.  - NEGATIVE FOR HIGH-GRADE DYSPLASIA AND MALIGNANCY.   EGD and colonoscopy 12/30/2016 by Dr. Vicente Males DIAGNOSIS:  A. STOMACH POLYP; COLD BIOPSY:  - HYPERPLASTIC GASTRIC POLYP.  - NEGATIVE FOR H. PYLORI, DYSPLASIA, AND MALIGNANCY.   B. GEJ; COLD BIOPSY:  - REFLUX GASTROESOPHAGITIS.  - NEGATIVE FOR DYSPLASIA AND MALIGNANCY.   C. COLON POLYP, CECUM; COLD SNARE:  - TUBULAR ADENOMA.  - NEGATIVE FOR HIGH GRADE DYSPLASIA AND MALIGNANCY.   D. COLON  POLYP, TRANSVERSE; COLD SNARE:  - TUBULAR ADENOMA.  - NEGATIVE FOR HIGH GRADE DYSPLASIA AND MALIGNANCY.   E. COLON POLYP 3, SIGMOID; COLD SNARE:  - TUBULAR ADENOMA (4 FRAGMENTS).  - NEGATIVE FOR HIGH GRADE DYSPLASIA AND MALIGNANCY.   Past Medical History:  Diagnosis  Date  . Diverticulosis   . Gastric ulcer   . GERD (gastroesophageal reflux disease)   . History of hiatal hernia   . Hypertension   . Iron deficiency anemia 10/18/2019  . 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 BACK SURGERY    . COLONOSCOPY WITH PROPOFOL N/A 12/30/2016   Procedure: COLONOSCOPY WITH PROPOFOL;  Surgeon: [name], MD;  Location: [facility] ENDOSCOPY;  Service: Endoscopy;  Laterality: N/A;  . COLONOSCOPY WITH PROPOFOL N/A 10/31/2019   Procedure: COLONOSCOPY WITH PROPOFOL;  Surgeon: Lin Landsman, MD;  Location: [facility] ENDOSCOPY;  Service: Gastroenterology;  Laterality: N/A;  . ESOPHAGOGASTRODUODENOSCOPY (EGD) WITH PROPOFOL N/A 12/30/2016   Procedure: ESOPHAGOGASTRODUODENOSCOPY (EGD) WITH PROPOFOL;  Surgeon: [name], MD;  Location[facility] ENDOSCOPY;  Service: Endoscopy;  Laterality: N/A;  . ESOPHAGOGASTRODUODENOSCOPY (EGD) WITH PROPOFOL N/A 10/31/2019   Procedure: ESOPHAGOGASTRODUODENOSCOPY (EGD) WITH PROPOFOL;  Surgeon: Lin Landsman, MD;  Location: [facility] ENDOSCOPY;  Service: Gastroenterology;  Laterality: N/A;  . HERNIA REPAIR  [id number]   Umbilical Hernia Repair  . LUMBAR LAMINECTOMY/DECOMPRESSION MICRODISCECTOMY Right 05/18/2016   Procedure: right L4-5 microdiscectomy;  Surgeon: Blanche East, MD;  Location: [facility] ORS;  Service: Neurosurgery;  Laterality: Right;  . TONSILLECTOMY    . UMBILICAL HERNIA REPAIR N/A 06/29/2018   Procedure: LAPAROSCOPIC REPAIR OF RECURRENT UMBILICAL HERNIA WITH MESH;  Surgeon: Vickie Epley, MD;  Location: [facility] ORS;  Service: General;  Laterality: N/A;     Current Outpatient Medications:  .  amLODipine-benazepril (LOTREL) 10-40 MG capsule, TAKE 1 CAPSULE BY MOUTH EVERY DAY, Disp: 90 capsule, Rfl: 1 .  dicyclomine (BENTYL) 10 MG capsule, Take 1 capsule (10 mg total) by mouth 4 (four) times daily -  before meals and at bedtime., Disp: 40  capsule, Rfl: 1 .  omeprazole (PRILOSEC) 40 MG capsule, TAKE 1 CAPSULE BY MOUTH TWICE A DAY, Disp: 180 capsule, Rfl: 1 .  ondansetron (ZOFRAN ODT) 8 MG disintegrating tablet, Take 1 tablet (8 mg total) by mouth every 8 (eight) hours as needed for nausea or vomiting., Disp: 30 tablet, Rfl: 1 .  promethazine (PHENERGAN) 25 MG tablet, Take 0.5-1 tablets (12.5-25 mg total) by mouth every 8 (eight) hours as needed for nausea or vomiting., Disp: 30 tablet, Rfl: 0 .  Vitamins/Minerals TABS, Take by mouth., Disp: , Rfl:  .  gabapentin (NEURONTIN) 100 MG capsule, Take 1 capsule (100 mg total) by mouth 3 (three) times daily., Disp: 90 capsule, Rfl: 2  Family History  Problem Relation Age of Onset  . Hypertension Mother   . Cancer Mother        Ovarian  . Cervical cancer Mother   . Hypertension Father   . Cancer Father        tongue  . Throat cancer Father   . Hypertension Brother   . Prostate cancer Neg Hx   . Breast cancer Neg Hx   . Colon cancer Neg Hx   . Heart attack Neg Hx   . Stroke Neg Hx      Social History   Tobacco Use  . Smoking status: Former Smoker    Packs/day: 2.00  Years: 25.00    Pack years: 50.00    Types: Cigarettes    Quit date: 05/31/1987    Years since quitting: 32.4  . Smokeless tobacco: Never Used  Vaping Use  . Vaping Use: Never used  Substance Use Topics  . Alcohol use: Yes    Comment: 8-10 beers per day  . Drug use: No    Allergies as of 11/12/2019 - Review Complete 11/12/2019  Allergen Reaction Noted  . Nsaids Other (See Comments) 05/09/2016    Review of Systems:    All systems reviewed and negative except where noted in HPI.   Physical Exam:  BP 130/78 (BP Location: Left Arm, Patient Position: Sitting, Cuff Size: Normal)   Pulse 97   Temp 98.1 F (36.7 C) (Oral)   Ht 5\' 7"  (1.702 m)   Wt 156 lb (70.8 kg)   BMI 24.43 kg/m  No LMP for male patient.  General:   Alert, moderately built, moderately nourished, pleasant and cooperative in  NAD Head:  Normocephalic and atraumatic. Eyes:  Sclera clear, no icterus.   Conjunctiva pink. Ears:  Normal auditory acuity. Nose:  No deformity, discharge, or lesions. Mouth:  No deformity or lesions,oropharynx pink & moist. Neck:  Supple; no masses or thyromegaly. Lungs:  Respirations even and unlabored.  Clear throughout to auscultation.   No wheezes, crackles, or rhonchi. No acute distress. Heart:  Regular rate and rhythm; no murmurs, clicks, rubs, or gallops. Abdomen:  Normal bowel sounds. Soft, non-tender and non-distended without masses, hepatosplenomegaly or hernias noted.  No guarding or rebound tenderness.   Rectal: Not performed Msk:  Symmetrical without gross deformities. Good, equal movement & strength bilaterally. Pulses:  Normal pulses noted. Extremities:  No clubbing or edema.  No cyanosis. Neurologic:  Alert and oriented x3;  grossly normal neurologically. Skin:  Intact without significant lesions or rashes. No jaundice. Psych:  Alert and cooperative. Normal mood and affect.  Imaging Studies: Reviewed  Assessment and Plan:   Stella Encarnacion Markham is a 59 y.o. 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 pylori IgG and treat if positive   Iron deficiency anemia EGD and colonoscopy are unremarkable Check H. pylori IgG and celiac serologies Follow-up with hematology, patient is responding to iron infusion so far  If  persistently anemic, recommend video capsule endoscopy  Alcohol dependence Start gabapentin 100 mg 3 times daily and titrate based on his response, helps to prevent alcohol withdrawal symptoms  Follow up in 3 months   Luis Darby, MD

## 2019-11-13 ENCOUNTER — Inpatient Hospital Stay: Payer: Commercial Managed Care - PPO

## 2019-11-13 VITALS — BP 122/66 | HR 101 | Temp 98.6°F

## 2019-11-13 DIAGNOSIS — D509 Iron deficiency anemia, unspecified: Secondary | ICD-10-CM

## 2019-11-13 MED ORDER — SODIUM CHLORIDE 0.9 % IV SOLN
Freq: Once | INTRAVENOUS | Status: AC
  Filled 2019-11-13: qty 250

## 2019-11-13 MED ORDER — SODIUM CHLORIDE 0.9 % IV SOLN: 200.0000 mg | Freq: Once | INTRAVENOUS | Status: DC

## 2019-11-13 MED ORDER — IRON SUCROSE 20 MG/ML IV SOLN
200.0000 mg | Freq: Once | INTRAVENOUS | Status: AC
  Administered 2019-11-13: 200 mg via INTRAVENOUS
  Filled 2019-11-13: qty 10

## 2019-11-14 LAB — H. PYLORI ANTIBODY, IGG: H. pylori, IgG AbS: 0.62 Index Value (ref 0.00–0.79)

## 2019-11-14 LAB — CELIAC DISEASE PANEL
Endomysial IgA: NEGATIVE
IgA/Immunoglobulin A, Serum: 275 mg/dL (ref 90–386)
Transglutaminase IgA: <2 U/mL (ref 0–3)

## 2019-11-15 ENCOUNTER — Ambulatory Visit: Payer: Commercial Managed Care - PPO

## 2019-11-20 ENCOUNTER — Inpatient Hospital Stay: Payer: Commercial Managed Care - PPO

## 2019-11-20 ENCOUNTER — Other Ambulatory Visit: Payer: Self-pay

## 2019-11-20 VITALS — BP 129/84 | HR 67 | Temp 98.3°F | Resp 20

## 2019-11-20 DIAGNOSIS — D509 Iron deficiency anemia, unspecified: Secondary | ICD-10-CM

## 2019-11-20 MED ORDER — IRON SUCROSE 20 MG/ML IV SOLN
200.0000 mg | Freq: Once | INTRAVENOUS | Status: AC
  Administered 2019-11-20: 200 mg via INTRAVENOUS
  Filled 2019-11-20: qty 10

## 2019-11-20 MED ORDER — SODIUM CHLORIDE 0.9 % IV SOLN: 200.0000 mg | Freq: Once | INTRAVENOUS | Status: DC

## 2019-11-20 MED ORDER — SODIUM CHLORIDE 0.9 % IV SOLN
Freq: Once | INTRAVENOUS | Status: AC
  Filled 2019-11-20: qty 250

## 2019-11-27 ENCOUNTER — Other Ambulatory Visit: Payer: Self-pay

## 2019-11-27 ENCOUNTER — Ambulatory Visit: Payer: Commercial Managed Care - PPO | Admitting: Gastroenterology

## 2019-11-27 ENCOUNTER — Inpatient Hospital Stay: Payer: Commercial Managed Care - PPO

## 2019-11-27 VITALS — BP 136/86 | HR 80 | Temp 97.8°F | Resp 18

## 2019-11-27 DIAGNOSIS — D509 Iron deficiency anemia, unspecified: Secondary | ICD-10-CM

## 2019-11-27 MED ORDER — SODIUM CHLORIDE 0.9 % IV SOLN: 200.0000 mg | Freq: Once | INTRAVENOUS | Status: DC

## 2019-11-27 MED ORDER — IRON SUCROSE 20 MG/ML IV SOLN
200.0000 mg | Freq: Once | INTRAVENOUS | Status: AC
  Administered 2019-11-27: 200 mg via INTRAVENOUS
  Filled 2019-11-27: qty 10

## 2019-11-27 MED ORDER — SODIUM CHLORIDE 0.9 % IV SOLN
Freq: Once | INTRAVENOUS | Status: AC
  Filled 2019-11-27: qty 250

## 2019-12-03 ENCOUNTER — Encounter: Payer: Self-pay | Admitting: Gastroenterology

## 2019-12-03 NOTE — Telephone Encounter (Signed)
dont this needs to come from the hematology office

## 2019-12-05 ENCOUNTER — Telehealth: Payer: Self-pay

## 2019-12-05 NOTE — Telephone Encounter (Signed)
Received Leave of absence forms for this patient. Called patient to clarify if this request is for the days that he had iron infusions in June or for furture appts. I have tried to call him multiple times and no answer, unable to leave VM. Will hold on to forms until I can talk to him and clarify.

## 2019-12-05 NOTE — Telephone Encounter (Signed)
Did youcall the number I sent to you in secure chat? [phone], his other phone is broken

## 2019-12-06 NOTE — Telephone Encounter (Signed)
Pt was contacted yesterday

## 2019-12-06 NOTE — Telephone Encounter (Signed)
FMLA forms faxed to [employer].

## 2019-12-12 ENCOUNTER — Telehealth: Payer: Self-pay | Admitting: *Deleted

## 2019-12-12 NOTE — Telephone Encounter (Signed)
Patient called and left a vm on triage, which was transferred to Dr. Sharmaine Base clinical line. Patient stated that he needs additional paperwork faxed to [facility]. He would like a return phone call to discuss the items that is needed.

## 2019-12-12 NOTE — Telephone Encounter (Signed)
I called patient and he is said that his FMLA is not covering past appointments. A copy of FMLA form was sent to pt's email per request.

## 2019-12-16 ENCOUNTER — Other Ambulatory Visit: Payer: Self-pay | Admitting: Family Medicine

## 2019-12-16 DIAGNOSIS — I10 Essential (primary) hypertension: Secondary | ICD-10-CM

## 2019-12-16 DIAGNOSIS — K219 Gastro-esophageal reflux disease without esophagitis: Secondary | ICD-10-CM

## 2019-12-16 DIAGNOSIS — K449 Diaphragmatic hernia without obstruction or gangrene: Secondary | ICD-10-CM

## 2019-12-25 ENCOUNTER — Inpatient Hospital Stay: Payer: Commercial Managed Care - PPO

## 2019-12-27 ENCOUNTER — Ambulatory Visit: Payer: Commercial Managed Care - PPO | Admitting: Oncology

## 2019-12-27 ENCOUNTER — Ambulatory Visit: Payer: Commercial Managed Care - PPO

## 2020-01-01 ENCOUNTER — Other Ambulatory Visit: Payer: Self-pay

## 2020-01-01 ENCOUNTER — Inpatient Hospital Stay: Payer: Commercial Managed Care - PPO | Attending: Oncology

## 2020-01-01 ENCOUNTER — Inpatient Hospital Stay: Payer: Commercial Managed Care - PPO

## 2020-01-01 DIAGNOSIS — Z8249 Family history of ischemic heart disease and other diseases of the circulatory system: Secondary | ICD-10-CM | POA: Insufficient documentation

## 2020-01-01 DIAGNOSIS — K219 Gastro-esophageal reflux disease without esophagitis: Secondary | ICD-10-CM | POA: Insufficient documentation

## 2020-01-01 DIAGNOSIS — D509 Iron deficiency anemia, unspecified: Secondary | ICD-10-CM

## 2020-01-01 DIAGNOSIS — Z87891 Personal history of nicotine dependence: Secondary | ICD-10-CM | POA: Diagnosis not present

## 2020-01-01 DIAGNOSIS — R197 Diarrhea, unspecified: Secondary | ICD-10-CM | POA: Insufficient documentation

## 2020-01-01 DIAGNOSIS — Z801 Family history of malignant neoplasm of trachea, bronchus and lung: Secondary | ICD-10-CM | POA: Insufficient documentation

## 2020-01-01 DIAGNOSIS — Z79899 Other long term (current) drug therapy: Secondary | ICD-10-CM | POA: Insufficient documentation

## 2020-01-01 DIAGNOSIS — I10 Essential (primary) hypertension: Secondary | ICD-10-CM | POA: Insufficient documentation

## 2020-01-01 DIAGNOSIS — Z8049 Family history of malignant neoplasm of other genital organs: Secondary | ICD-10-CM | POA: Diagnosis not present

## 2020-01-01 DIAGNOSIS — D508 Other iron deficiency anemias: Secondary | ICD-10-CM | POA: Insufficient documentation

## 2020-01-01 DIAGNOSIS — Z8041 Family history of malignant neoplasm of ovary: Secondary | ICD-10-CM | POA: Diagnosis not present

## 2020-01-01 LAB — CBC WITH DIFFERENTIAL/PLATELET
Abs Immature Granulocytes: 0.04 10*3/uL (ref 0.00–0.07)
Basophils Absolute: 0.2 10*3/uL — ABNORMAL HIGH (ref 0.0–0.1)
Basophils Relative: 2 %
Eosinophils Absolute: 0.2 10*3/uL (ref 0.0–0.5)
Eosinophils Relative: 2 %
HCT: 40.9 % (ref 39.0–52.0)
Hemoglobin: 14.5 g/dL (ref 13.0–17.0)
Immature Granulocytes: 0 %
Lymphocytes Relative: 19 %
Lymphs Abs: 1.9 10*3/uL (ref 0.7–4.0)
MCH: 30.7 pg (ref 26.0–34.0)
MCHC: 35.5 g/dL (ref 30.0–36.0)
MCV: 86.5 fL (ref 80.0–100.0)
Monocytes Absolute: 1.1 10*3/uL — ABNORMAL HIGH (ref 0.1–1.0)
Monocytes Relative: 11 %
Neutro Abs: 6.6 10*3/uL (ref 1.7–7.7)
Neutrophils Relative %: 66 %
Platelets: 300 10*3/uL (ref 150–400)
RBC: 4.73 MIL/uL (ref 4.22–5.81)
RDW: 18.5 % — ABNORMAL HIGH (ref 11.5–15.5)
WBC: 9.9 10*3/uL (ref 4.0–10.5)
nRBC: 0 % (ref 0.0–0.2)

## 2020-01-01 LAB — FERRITIN: Ferritin: 52 ng/mL (ref 24–336)

## 2020-01-01 LAB — IRON AND TIBC
Iron: 110 ug/dL (ref 45–182)
Saturation Ratios: 25 % (ref 17.9–39.5)
TIBC: 445 ug/dL (ref 250–450)
UIBC: 335 ug/dL

## 2020-01-03 ENCOUNTER — Other Ambulatory Visit: Payer: Self-pay

## 2020-01-03 ENCOUNTER — Inpatient Hospital Stay (HOSPITAL_BASED_OUTPATIENT_CLINIC_OR_DEPARTMENT_OTHER): Payer: Commercial Managed Care - PPO | Admitting: Oncology

## 2020-01-03 ENCOUNTER — Inpatient Hospital Stay: Payer: Commercial Managed Care - PPO

## 2020-01-03 ENCOUNTER — Encounter: Payer: Self-pay | Admitting: Oncology

## 2020-01-03 VITALS — BP 124/72 | HR 90 | Temp 98.1°F | Resp 18 | Wt 158.0 lb

## 2020-01-03 DIAGNOSIS — D72824 Basophilia: Secondary | ICD-10-CM | POA: Diagnosis not present

## 2020-01-03 DIAGNOSIS — D508 Other iron deficiency anemias: Secondary | ICD-10-CM

## 2020-01-03 DIAGNOSIS — D509 Iron deficiency anemia, unspecified: Secondary | ICD-10-CM

## 2020-01-03 NOTE — Progress Notes (Signed)
Pt here for follow up. No new concerns voiced.   

## 2020-01-03 NOTE — Progress Notes (Signed)
Hematology/Oncology Consult note [facility] Telephone:([phone] [phone]   Patient Care Team: Olin Hauser, DO as PCP - General (Family Medicine)  REFERRING PROVIDER: Nobie Putnam * CHIEF COMPLAINTS/REASON FOR VISIT:  Evaluation of iron deficiency anemia  HISTORY OF PRESENTING ILLNESS:  Luis Porter is a  59 y.o.  male with PMH listed below was seen in consultation at the request of Nobie Putnam *   for evaluation of iron deficiency anemia.   Reviewed patient's recent labs  10/15/2019 labs revealed anemia with hemoglobin of 10.8, MCV 93, platelet count 248,000, white blood cell 5.7.  Iron panel showed ferritin 19, iron saturation 3, TIBC 506. Reviewed patient's previous lab, anemia is acute onset since April 2021  Patient had an episode of acute sigmoid diverticulitis, with bloody bowel movements, and was admitted to [facility] in April 2021.  CT confirmed presence of diverticulitis.  Patient received antibiotic treatments.  Currently he does not have any additional bloody bowel movement episodes.  He has ongoing nonbloody diarrhea. Patient was recently seen by Dr. Marius Ditch 10/15/2019 there is plan for stool studies and colonoscopy after ruling out infectious etiology.  Patient drinks alcohol, 6-7 beers daily.  Currently on omeprazole 40 mg twice daily.  There is plan for EGD. Patient reports profound fatigue.  INTERVAL HISTORY Luis Porter is a 59 y.o. male who has above history reviewed by me today presents for follow up visit for management of iron deficiency anemia Problems and complaints are listed below: Patient reports feeling very well today.  he has received IV Venofer treatments.  Fatigue has improved  Review of Systems  Constitutional: Negative for appetite change, chills, fatigue, fever and unexpected weight change.  HENT:   Negative for hearing loss and voice change.   Eyes: Negative for eye  problems and icterus.  Respiratory: Negative for chest tightness, cough and shortness of breath.   Cardiovascular: Negative for chest pain and leg swelling.  Gastrointestinal: Negative for abdominal distention and abdominal pain.  Endocrine: Negative for hot flashes.  Genitourinary: Negative for difficulty urinating, dysuria and frequency.   Musculoskeletal: Negative for arthralgias.  Skin: Negative for itching and rash.  Neurological: Negative for light-headedness and numbness.  Hematological: Negative for adenopathy. Does not bruise/bleed easily.  Psychiatric/Behavioral: Negative for confusion.    MEDICAL HISTORY:  Past Medical History:  Diagnosis Date  . Diverticulosis   . Gastric ulcer   . GERD (gastroesophageal reflux disease)   . History of hiatal hernia   . Hypertension   . Iron deficiency anemia 10/18/2019  . Recurrent umbilical hernia with incarceration 11/08/2016   Previously repaired x 1    SURGICAL HISTORY: Past Surgical History:  Procedure Laterality Date  . BACK SURGERY    . COLONOSCOPY WITH PROPOFOL N/A 12/30/2016   Procedure: COLONOSCOPY WITH PROPOFOL;  Surgeon: [name], MD;  Location: [facility] ENDOSCOPY;  Service: Endoscopy;  Laterality: N/A;  . COLONOSCOPY WITH PROPOFOL N/A 10/31/2019   Procedure: COLONOSCOPY WITH PROPOFOL;  Surgeon: Lin Landsman, MD;  Location: [facility] ENDOSCOPY;  Service: Gastroenterology;  Laterality: N/A;  . ESOPHAGOGASTRODUODENOSCOPY (EGD) WITH PROPOFOL N/A 12/30/2016   Procedure: ESOPHAGOGASTRODUODENOSCOPY (EGD) WITH PROPOFOL;  Surgeon: [name], MD;  Location: [facility] ENDOSCOPY;  Service: Endoscopy;  Laterality: N/A;  . ESOPHAGOGASTRODUODENOSCOPY (EGD) WITH PROPOFOL N/A 10/31/2019   Procedure: ESOPHAGOGASTRODUODENOSCOPY (EGD) WITH PROPOFOL;  Surgeon: Lin Landsman, MD;  Location: [facility] ENDOSCOPY;  Service: Gastroenterology;  Laterality: N/A;  . HERNIA REPAIR  9150   Umbilical Hernia Repair  .  LUMBAR LAMINECTOMY/DECOMPRESSION  MICRODISCECTOMY Right 05/18/2016   Procedure: right L4-5 microdiscectomy;  Surgeon: Blanche East, MD;  Location: [facility] ORS;  Service: Neurosurgery;  Laterality: Right;  . TONSILLECTOMY    . UMBILICAL HERNIA REPAIR N/A 06/29/2018   Procedure: LAPAROSCOPIC REPAIR OF RECURRENT UMBILICAL HERNIA WITH MESH;  Surgeon: Vickie Epley, MD;  Location: [facility] ORS;  Service: General;  Laterality: N/A;    SOCIAL HISTORY: Social History   Socioeconomic History  . Marital status: Divorced    Spouse name: Not on file  . Number of children: Not on file  . Years of education: Not on file  . Highest education level: Not on file  Occupational History  . Not on file  Tobacco Use  . Smoking status: Former Smoker    Packs/day: 2.00    Years: 25.00    Pack years: 50.00    Types: Cigarettes    Quit date: 05/31/1987    Years since quitting: 32.6  . Smokeless tobacco: Never Used  Vaping Use  . Vaping Use: Never used  Substance and Sexual Activity  . Alcohol use: Yes    Comment: 8-10 beers per day  . Drug use: No  . Sexual activity: Yes    Comment: parners birth control  Other Topics Concern  . Not on file  Social History Narrative  . Not on file   Social Determinants of Health   Financial Resource Strain:   . Difficulty of Paying Living Expenses:   Food Insecurity:   . Worried About Charity fundraiser in the Last Year:   . Arboriculturist in the Last Year:   Transportation Needs:   . Film/video editor (Medical):   Marland Kitchen Lack of Transportation (Non-Medical):   Physical Activity:   . Days of Exercise per Week:   . Minutes of Exercise per Session:   Stress:   . Feeling of Stress :   Social Connections:   . Frequency of Communication with Friends and Family:   . Frequency of Social Gatherings with Friends and Family:   . Attends Religious Services:   . Active Member of Clubs or Organizations:   . Attends Archivist Meetings:   Marland Kitchen Marital Status:   Intimate Partner  Violence:   . Fear of Current or Ex-Partner:   . Emotionally Abused:   Marland Kitchen Physically Abused:   . Sexually Abused:     FAMILY HISTORY: Family History  Problem Relation Age of Onset  . Hypertension Mother   . Cancer Mother        Ovarian  . Cervical cancer Mother   . Hypertension Father   . Cancer Father        tongue  . Throat cancer Father   . Hypertension Brother   . Prostate cancer Neg Hx   . Breast cancer Neg Hx   . Colon cancer Neg Hx   . Heart attack Neg Hx   . Stroke Neg Hx     ALLERGIES:  is allergic to nsaids.  MEDICATIONS:  Current Outpatient Medications  Medication Sig Dispense Refill  . amLODipine-benazepril (LOTREL) 10-40 MG capsule TAKE 1 CAPSULE BY MOUTH EVERY DAY 90 capsule 0  . dicyclomine (BENTYL) 10 MG capsule Take 1 capsule (10 mg total) by mouth 4 (four) times daily -  before meals and at bedtime. 40 capsule 1  . omeprazole (PRILOSEC) 40 MG capsule TAKE 1 CAPSULE BY MOUTH TWICE A DAY 180 capsule 0  . Vitamins/Minerals TABS Take by mouth.    Marland Kitchen  gabapentin (NEURONTIN) 100 MG capsule Take 1 capsule (100 mg total) by mouth 3 (three) times daily. (Patient not taking: Reported on 01/03/2020) 90 capsule 2  . ondansetron (ZOFRAN ODT) 8 MG disintegrating tablet Take 1 tablet (8 mg total) by mouth every 8 (eight) hours as needed for nausea or vomiting. (Patient not taking: Reported on 01/03/2020) 30 tablet 1  . promethazine (PHENERGAN) 25 MG tablet Take 0.5-1 tablets (12.5-25 mg total) by mouth every 8 (eight) hours as needed for nausea or vomiting. (Patient not taking: Reported on 01/03/2020) 30 tablet 0   No current facility-administered medications for this visit.     PHYSICAL EXAMINATION: ECOG PERFORMANCE STATUS: 0 - Asymptomatic Vitals:   01/03/20 1350  BP: 124/72  Pulse: 90  Resp: 18  Temp: 98.1 F (36.7 C)   Filed Weights   01/03/20 1350  Weight: 158 lb (71.7 kg)    Physical Exam Constitutional:      General: He is not in acute distress. HENT:      Head: Normocephalic and atraumatic.  Eyes:     General: No scleral icterus. Cardiovascular:     Rate and Rhythm: Normal rate and regular rhythm.     Heart sounds: Normal heart sounds.  Pulmonary:     Effort: Pulmonary effort is normal. No respiratory distress.     Breath sounds: No wheezing.  Abdominal:     General: Bowel sounds are normal. There is no distension.     Palpations: Abdomen is soft.  Musculoskeletal:        General: No deformity. Normal range of motion.     Cervical back: Normal range of motion and neck supple.  Skin:    General: Skin is warm and dry.     Findings: No erythema or rash.  Neurological:     Mental Status: He is alert and oriented to person, place, and time. Mental status is at baseline.     Cranial Nerves: No cranial nerve deficit.     Coordination: Coordination normal.  Psychiatric:        Mood and Affect: Mood normal.       CMP Latest Ref Rng & Units 10/20/2019  Glucose 70 - 99 mg/dL 100(H)  BUN 6 - 20 mg/dL <5(L)  Creatinine 0.61 - 1.24 mg/dL 0.58(L)  Sodium 135 - 145 mmol/L 132(L)  Potassium 3.5 - 5.1 mmol/L 3.6  Chloride 98 - 111 mmol/L 97(L)  CO2 22 - 32 mmol/L 22  Calcium 8.9 - 10.3 mg/dL 8.7(L)  Total Protein 6.5 - 8.1 g/dL 7.7  Total Bilirubin 0.3 - 1.2 mg/dL 0.7  Alkaline Phos 38 - 126 U/L 52  AST 15 - 41 U/L 74(H)  ALT 0 - 44 U/L 46(H)   CBC Latest Ref Rng & Units 01/01/2020  WBC 4.0 - 10.5 K/uL 9.9  Hemoglobin 13.0 - 17.0 g/dL 14.5  Hematocrit 39 - 52 % 40.9  Platelets 150 - 400 K/uL 300     LABORATORY DATA:  I have reviewed the data as listed Lab Results  Component Value Date   WBC 9.9 01/01/2020   HGB 14.5 01/01/2020   HCT 40.9 01/01/2020   MCV 86.5 01/01/2020   PLT 300 01/01/2020   Recent Labs    09/19/19 1002 09/19/19 1002 09/19/19 1824 09/30/19 1008 10/20/19 1946  NA 134*   < > 135 137 132*  K 3.6   < > 3.4* 4.1 3.6  CL 100   < > 103 100 97*  CO2 23   < >  22 25 22   GLUCOSE 97   < > 209* 104* 100*  BUN  6   < > 7 6* <5*  CREATININE 0.57*   < > 0.64 0.61* 0.58*  CALCIUM 8.6*   < > 8.4* 9.3 8.7*  GFRNONAA >60   < > >60 111 >60  GFRAA >60   < > >60 128 >60  PROT 7.4  --  6.3*  --  7.7  ALBUMIN 3.9  --  3.5  --  4.2  AST 37  --  30  --  74*  ALT 29  --  24  --  46*  ALKPHOS 55  --  46  --  52  BILITOT 0.5  --  0.7  --  0.7   < > = values in this interval not displayed.   Iron/TIBC/Ferritin/ %Sat    Component Value Date/Time   IRON 110 01/01/2020 1329   IRON 14 (L) 10/15/2019 1024   TIBC 445 01/01/2020 1329   TIBC 506 (H) 10/15/2019 1024   FERRITIN 52 01/01/2020 1329   FERRITIN 19 (L) 10/15/2019 1024   IRONPCTSAT 25 01/01/2020 1329   IRONPCTSAT 3 (LL) 10/15/2019 1024     ASSESSMENT & PLAN:  1. Other iron deficiency anemia   2. Basophilia    Labs are reviewed and discussed with patient.  Iron panel has improved and anemia has resolved. No need for additional IV Venofer today.  Monocytosis and basophilia, reviewed previous blood work.  He also has basal failure 3 months ago.  Most likely reactive etiology. I will check BCR ABL. Assuming blood work is normal.  I will discharge patient from our clinic.  All questions were answered. The patient knows to call the clinic with any problems questions or concerns. Earlie Server, MD, PhD Hematology Oncology [place] [facility] 01/03/2020

## 2020-01-10 LAB — BCR-ABL1 FISH
Cells Analyzed: 200
Cells Counted: 200

## 2020-01-17 ENCOUNTER — Other Ambulatory Visit: Payer: Self-pay | Admitting: Family Medicine

## 2020-01-17 DIAGNOSIS — K449 Diaphragmatic hernia without obstruction or gangrene: Secondary | ICD-10-CM

## 2020-01-17 DIAGNOSIS — K219 Gastro-esophageal reflux disease without esophagitis: Secondary | ICD-10-CM

## 2020-01-17 DIAGNOSIS — I10 Essential (primary) hypertension: Secondary | ICD-10-CM

## 2020-01-23 ENCOUNTER — Ambulatory Visit (INDEPENDENT_AMBULATORY_CARE_PROVIDER_SITE_OTHER): Payer: Commercial Managed Care - PPO | Admitting: Gastroenterology

## 2020-01-23 ENCOUNTER — Encounter: Payer: Self-pay | Admitting: Gastroenterology

## 2020-01-23 ENCOUNTER — Other Ambulatory Visit: Payer: Self-pay

## 2020-01-23 VITALS — BP 159/77 | HR 102 | Temp 99.0°F | Wt 155.4 lb

## 2020-01-23 DIAGNOSIS — R197 Diarrhea, unspecified: Secondary | ICD-10-CM

## 2020-01-23 DIAGNOSIS — R1032 Left lower quadrant pain: Secondary | ICD-10-CM

## 2020-01-23 NOTE — Progress Notes (Signed)
G

## 2020-01-24 NOTE — Progress Notes (Signed)
Cephas Darby, MD [address]  [place]  [place], [place] [address]  Main: [phone]  Fax: [phone]    Gastroenterology Consultation  Referring Provider:     Nobie Putnam * Primary Care Physician:  Olin Hauser, DO Primary Gastroenterologist:  Dr. Vicente Males Reason for Consultation:     History of acute sigmoid diverticulitis, lower abdominal pain and increased bowel frequency, alcohol dependence        HPI:   Luis Porter is a 59 y.o. male referred by Dr. Parks Ranger, [name], DO  for consultation & management of approximately 1 month history of lower abdominal pain, worse in left lower quadrant associated with 2-3 soft bowel movements daily.  Patient had recent episode of acute sigmoid diverticulitis, admitted to [facility] end of April 2021, CT confirmed presence of mild diverticulitis of the proximal sigmoid colon, uncomplicated.  This was treated with Augmentin.  Patient had bloody diarrhea during this event.  Since that admission, he did not have any episodes of rectal bleeding.  Patient reports having another event in 05/2019.  He acknowledges drinking alcohol, 6-7 beers per day for several years.  He does report fatigue, lower abdominal cramps as well as epigastric pain.  He denies melena.  He is currently on omeprazole 40 mg twice daily He also reports weight loss, unable to go to work due to bowel urgency and abdominal cramps postprandial  Follow-up visit 11/12/2019 Patient reports doing significantly better since last visit.  He underwent upper endoscopy as well as colonoscopy.  Upper endoscopy was unremarkable, colonoscopy revealed benign polyps, random colon biopsies were normal.  He has cut down on alcohol to 5 beers per day from 12 pack/day.  He is currently on omeprazole 40 mg 2 times daily with significantly relieves his dyspepsia symptoms.  He is also followed by Dr. Tasia Catchings, hematologist for IV iron.  He received first infusion on 6/9.  He  reports feeling better but has headaches, palpitations, jitteriness, likely alcohol withdrawal symptoms.  He denies any black stools.  His anemia is improving.  He is requesting return to work letter  Follow-up visit 01/23/2020 Patient has been doing well from anemia standpoint.  His iron deficiency anemia has resolved.  Today, he is concerned about 2 weeks history of nonbloody diarrhea associated with significant flatulence that is interfering with his work.  He is asking for work excuse for 1 to 2 weeks until his diarrhea is resolved.  He denies eating out.  He denies abdominal pain.  He does report intermittent nausea and vomiting, particularly end of the day after he goes home.  He denies fever, chills.  He does report having bowel movements 4-5 times daily.  He denies nocturnal diarrhea. He denies consuming carbonated beverages He did not start taking gabapentin  NSAIDs: None  Antiplts/Anticoagulants/Anti thrombotics: None  GI Procedures:  EGD and colonoscopy 10/31/2019 EGD was unremarkable  Colonoscopy - Preparation of the colon was fair. - Hemorrhoids found on perianal exam. - The examined portion of the ileum was normal. - Normal mucosa in the entire examined colon. Biopsied. - Two 3 to 5 mm polyps in the sigmoid colon and in the descending colon, removed with a cold snare. Resected and retrieved. - Severe diverticulosis in the sigmoid colon. There was no evidence of diverticular bleeding. - Non-bleeding external hemorrhoids.  DIAGNOSIS:  A. COLON, RANDOM; BIOPSIES:  - BENIGN COLONIC MUCOSA, NEGATIVE FOR ACTIVE MUCOSAL COLITIS,  LYMPHOCYTIC/MICROSCOPIC COLITIS, GRANULOMA AND DYSPLASIA.   B. COLON POLYP, DESCENDING;  BIOPSY:  - TUBULAR ADENOMA AND FRAGMENTS OF BENIGN COLONIC MUCOSA.  - NEGATIVE FOR HIGH-GRADE DYSPLASIA AND MALIGNANCY.   C. COLON POLYP, SIGMOID, BIOPSY:  - TUBULAR ADENOMA AND FRAGMENTS OF BENIGN COLONIC MUCOSA.  - NEGATIVE FOR HIGH-GRADE DYSPLASIA AND  MALIGNANCY.   EGD and colonoscopy 12/30/2016 by Dr. Vicente Males DIAGNOSIS:  A. STOMACH POLYP; COLD BIOPSY:  - HYPERPLASTIC GASTRIC POLYP.  - NEGATIVE FOR H. PYLORI, DYSPLASIA, AND MALIGNANCY.   B. GEJ; COLD BIOPSY:  - REFLUX GASTROESOPHAGITIS.  - NEGATIVE FOR DYSPLASIA AND MALIGNANCY.   C. COLON POLYP, CECUM; COLD SNARE:  - TUBULAR ADENOMA.  - NEGATIVE FOR HIGH GRADE DYSPLASIA AND MALIGNANCY.   D. COLON POLYP, TRANSVERSE; COLD SNARE:  - TUBULAR ADENOMA.  - NEGATIVE FOR HIGH GRADE DYSPLASIA AND MALIGNANCY.   E. COLON POLYP 3, SIGMOID; COLD SNARE:  - TUBULAR ADENOMA (4 FRAGMENTS).  - NEGATIVE FOR HIGH GRADE DYSPLASIA AND MALIGNANCY.   Past Medical History:  Diagnosis Date  . Diverticulosis   . Gastric ulcer   . GERD (gastroesophageal reflux disease)   . History of hiatal hernia   . Hypertension   . Iron deficiency anemia 10/18/2019  . 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 BACK SURGERY    . COLONOSCOPY WITH PROPOFOL N/A 12/30/2016   Procedure: COLONOSCOPY WITH PROPOFOL;  Surgeon: [name], MD;  Location: [facility] ENDOSCOPY;  Service: Endoscopy;  Laterality: N/A;  . COLONOSCOPY WITH PROPOFOL N/A 10/31/2019   Procedure: COLONOSCOPY WITH PROPOFOL;  Surgeon: Lin Landsman, MD;  Location: [facility] ENDOSCOPY;  Service: Gastroenterology;  Laterality: N/A;  . ESOPHAGOGASTRODUODENOSCOPY (EGD) WITH PROPOFOL N/A 12/30/2016   Procedure: ESOPHAGOGASTRODUODENOSCOPY (EGD) WITH PROPOFOL;  Surgeon: [name], MD;  Location: [facility] ENDOSCOPY;  Service: Endoscopy;  Laterality: N/A;  . ESOPHAGOGASTRODUODENOSCOPY (EGD) WITH PROPOFOL N/A 10/31/2019   Procedure: ESOPHAGOGASTRODUODENOSCOPY (EGD) WITH PROPOFOL;  Surgeon: Lin Landsman, MD;  Location: [facility] ENDOSCOPY;  Service: Gastroenterology;  Laterality: N/A;  . HERNIA REPAIR  [id number]   Umbilical Hernia Repair  . LUMBAR LAMINECTOMY/DECOMPRESSION  MICRODISCECTOMY Right 05/18/2016   Procedure: right L4-5 microdiscectomy;  Surgeon: Blanche East, MD;  Location: [facility] ORS;  Service: Neurosurgery;  Laterality: Right;  . TONSILLECTOMY    . UMBILICAL HERNIA REPAIR N/A 06/29/2018   Procedure: LAPAROSCOPIC REPAIR OF RECURRENT UMBILICAL HERNIA WITH MESH;  Surgeon: Vickie Epley, MD;  Location: [facility] ORS;  Service: General;  Laterality: N/A;     Current Outpatient Medications:  .  amLODipine-benazepril (LOTREL) 10-40 MG capsule, TAKE 1 CAPSULE BY MOUTH EVERY DAY, Disp: 90 capsule, Rfl: 0 .  dicyclomine (BENTYL) 10 MG capsule, Take 1 capsule (10 mg total) by mouth 4 (four) times daily -  before meals and at bedtime., Disp: 40 capsule, Rfl: 1 .  omeprazole (PRILOSEC) 40 MG capsule, TAKE 1 CAPSULE BY MOUTH TWICE A DAY, Disp: 180 capsule, Rfl: 0 .  Vitamins/Minerals TABS, Take by mouth., Disp: , Rfl:   Family History  Problem Relation Age of Onset  . Hypertension Mother   . Cancer Mother        Ovarian  . Cervical cancer Mother   . Hypertension Father   . Cancer Father        tongue  . Throat cancer Father   . Hypertension Brother   . Prostate cancer Neg Hx   . Breast cancer Neg Hx   . Colon cancer Neg Hx   . Heart attack  Neg Hx   . Stroke Neg Hx      Social History   Tobacco Use  . Smoking status: Former Smoker    Packs/day: 2.00    Years: 25.00    Pack years: 50.00    Types: Cigarettes    Quit date: 05/31/1987    Years since quitting: 32.6  . Smokeless tobacco: Never Used  Vaping Use  . Vaping Use: Never used  Substance Use Topics  . Alcohol use: Yes    Comment: 8-10 beers per day  . Drug use: No    Allergies as of 01/23/2020 - Review Complete 01/23/2020  Allergen Reaction Noted  . Nsaids Other (See Comments) 05/09/2016    Review of Systems:    All systems reviewed and negative except where noted in HPI.   Physical Exam:  BP (!) 159/77 (BP Location: Left Arm, Patient Position: Sitting, Cuff Size: Normal)    Pulse (!) 102   Temp 99 F (37.2 C) (Oral)   Wt 155 lb 6 oz (70.5 kg)   BMI 24.34 kg/m  No LMP for male patient.  General:   Alert, moderately built, moderately nourished, pleasant and cooperative in NAD Head:  Normocephalic and atraumatic. Eyes:  Sclera clear, no icterus.   Conjunctiva pink. Ears:  Normal auditory acuity. Nose:  No deformity, discharge, or lesions. Mouth:  No deformity or lesions,oropharynx pink & moist. Neck:  Supple; no masses or thyromegaly. Lungs:  Respirations even and unlabored.  Clear throughout to auscultation.   No wheezes, crackles, or rhonchi. No acute distress. Heart:  Regular rate and rhythm; no murmurs, clicks, rubs, or gallops. Abdomen:  Normal bowel sounds. Soft, non-tender and mildly distended, tympanic without masses, hepatosplenomegaly or hernias noted.  No guarding or rebound tenderness.   Rectal: Not performed Msk:  Symmetrical without gross deformities. Good, equal movement & strength bilaterally. Pulses:  Normal pulses noted. Extremities:  No clubbing or edema.  No cyanosis. Neurologic:  Alert and oriented x3;  grossly normal neurologically. Skin:  Intact without significant lesions or rashes. No jaundice. Psych:  Alert and cooperative. Normal mood and affect.  Imaging Studies: Reviewed  Assessment and Plan:   Luis Porter is a 59 y.o. male with history of alcohol abuse, no evidence of chronic liver disease or cirrhosis is seen for follow-up of 2 weeks history of nonbloody diarrhea with flatulence.  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 pylori IgG negative  Iron deficiency anemia, currently resolved Patient responded to parenteral iron therapy EGD and colonoscopy are unremarkable H. pylori IgG and celiac serologies unremarkable  Alcohol dependence Start gabapentin 100 mg 3 times daily and titrate based on his response, helps to prevent alcohol withdrawal symptoms  Follow up in 3 months   Cephas Darby, MD

## 2020-01-29 ENCOUNTER — Other Ambulatory Visit: Payer: Self-pay

## 2020-01-29 ENCOUNTER — Telehealth (INDEPENDENT_AMBULATORY_CARE_PROVIDER_SITE_OTHER): Payer: Commercial Managed Care - PPO | Admitting: Family Medicine

## 2020-01-29 ENCOUNTER — Telehealth: Payer: Commercial Managed Care - PPO | Admitting: Family Medicine

## 2020-01-29 ENCOUNTER — Encounter: Payer: Self-pay | Admitting: Family Medicine

## 2020-01-29 DIAGNOSIS — Z20822 Contact with and (suspected) exposure to covid-19: Secondary | ICD-10-CM | POA: Diagnosis not present

## 2020-01-29 DIAGNOSIS — R059 Cough, unspecified: Secondary | ICD-10-CM

## 2020-01-29 DIAGNOSIS — R05 Cough: Secondary | ICD-10-CM

## 2020-01-29 NOTE — Patient Instructions (Addendum)
Thank you for coming to the office today.    Please schedule a Follow-up Appointment to: Return in about 1 week (around 02/05/2020), or if symptoms worsen or fail to improve, for viral syndrome, possible covid.  If you have any other questions or concerns, please feel free to call the office or send a message through [place]. You may also schedule an earlier appointment if necessary.  Additionally, you may be receiving a survey about your experience at our office within a few days to 1 week by e-mail or mail. We value your feedback.  Nobie Putnam, DO [place]

## 2020-01-29 NOTE — Progress Notes (Signed)
Virtual Visit via Telephone The purpose of this virtual visit is to provide medical care while limiting exposure to the novel coronavirus (COVID19) for both patient and office staff.  Consent was obtained for phone visit:  Yes.   Answered questions that patient had about telehealth interaction:  Yes.   I discussed the limitations, risks, security and privacy concerns of performing an evaluation and management service by telephone. I also discussed with the patient that there may be a patient responsible charge related to this service. The patient expressed understanding and agreed to proceed.  Patient Location: Home Provider Location: [facility])   ---------------------------------------------------------------------- Chief Complaint  Patient presents with  . Cough    HA, cough, fever, chills, SOB onset 6 days and his covid test came back negative     S: Reviewed CMA documentation. I have called patient and gathered additional HPI as follows:  COUGH / HEADACHE / FEVER / Viral Syndrome Reports symptoms started 6 days ago, no known sick contact. He was in contact with his fiance and they both had symptoms similar URI with cough fever and dyspnea. She was tested and in hospital now admitted we are still waiting on her test result. - He was tested for COVID on Monday 01/27/20 and his test (48 hour, saliva test). Later on Monday he was then given COVID19 Vaccine (Moderna 1st dose 01/27/20). - He was already having symptoms prior to vaccine. Now he has persistent worsening symptoms, question if some side effect. - He is much improved on OTC coricidin HBP Flu with some good results  -Also admits Face dermatitis reaction, due to mask recently, he sent picture on mychart.  Patient currently in isolation  Admits fever, chill bodyache headache cough Denies any  sinus pain or pressure, abdominal pain, diarrhea, loss taste  smell  -------------------------------------------------------------------------- O: No physical exam performed due to remote telephone encounter.  -------------------------------------------------------------------------- A&P:   Suspected viral syndrome / possible COVID May have some secondary side effects of COVID vaccine from 01/27/20 No sign of bacterial etiology based on history reported to me at this time, we are limited by virtual visit - No comorbid pulmonary conditions (asthma, COPD) or immunocompromise  COVID19 Test NEGATIVE 01/27/20 1st dose Moderna vaccine 01/27/20  1. Advised will write work note now for absence from his time off since 01/27/20 due to covid test and now still needs to be out of work due to fever and still flu-like viral symptoms, cannot rule out covid, may have had false negative test, has sick contact concerning waiting on her test result. 2. Use Coricidin HBP for symptoms, offered other meds OTC and rx and declined at this time 3. Follow-up criteria given, seek care at urgent care or hospital if worsening or not improving  He may return on Weds 02/05/20 if fever free >3 days and symptoms improve, that is 10 days from first date of his covid test / vaccine If he has worse symptoms or wife is positive he should get re-tested promptly or by next week Mon/Tues/Weds, then may remain out up to 10 days from a test result.  No orders of the defined types were placed in this encounter.   REQUIRED self quarantine to [place] - advised to avoid all exposure with others while during TESTING and treatment. Should continue to quarantine for up to 10 days - pending resolution of symptoms, if TEST IS NEGATIVE and symptoms resolve by 10 days and is afebrile >3 days - may STOP  self quarantine at that time. IF test is POSITIVE then will require 10 additional day quarantine after date of positive test result.   If symptoms do not resolve or significantly improve OR if  WORSENING - fever / cough - or worsening shortness of breath - then should contact us and seek advice on next steps in treatment at home vs where/when to seek care at Urgent Care or Hospital ED for further intervention and possible testing if indicated.  Patient verbalizes understanding with the above medical recommendations including the limitation of remote medical advice.  Specific follow-up / call-back criteria were given for patient to follow-up or seek medical care more urgently if needed.   - Time spent in direct consultation with patient on phone: 10 minutes   Nobie Putnam, [facility] 01/29/2020, 2:10 PM

## 2020-01-30 ENCOUNTER — Ambulatory Visit: Payer: Commercial Managed Care - PPO | Admitting: Gastroenterology

## 2020-02-01 ENCOUNTER — Other Ambulatory Visit: Payer: Self-pay | Admitting: Gastroenterology

## 2020-02-04 NOTE — Telephone Encounter (Signed)
Last office visit 01/23/2020  Last refill 01/06/2020 but on 01/23/2020 patient said he was not taking medication

## 2020-02-10 ENCOUNTER — Ambulatory Visit: Payer: Self-pay

## 2020-02-10 NOTE — Telephone Encounter (Signed)
Yes, needs appointment (in person or virtual) visit for follow-up on his symptoms  I have written new work note sent to his mychart in meantime, out of work through Wednesday, return Thursday for now.  Nobie Putnam, [facility] 02/10/2020, 11:01 AM

## 2020-02-10 NOTE — Telephone Encounter (Signed)
Patient's appointment double book for 02/11/2020 around 9:40 am virtual.

## 2020-02-10 NOTE — Telephone Encounter (Signed)
Copied from [place] [id number]. Topic: General - Inquiry >> Feb 10, 2020  7:08 AM Lennox Solders wrote: Reason for CRM: pt is unable to go to work today. The patient will be talking with triage nurse this morning and he is having bronchitis symptom. Pt does not know how many more day he will need to be out >> Feb 10, 2020  8:47 AM Monia Pouch, CMA wrote: Patient was seen on 01/29/20 by Dr. Raliegh Ip he needs in office appt.

## 2020-02-10 NOTE — Telephone Encounter (Signed)
Pt. Reports he has 2 negative COVID 19 tests. Still having productive cough with green mucus and runny nose. Has wheezing in the morning. Has severe coughing spells. Coricidin is no longer helping. No availability for virtual visit with PCP today. Office currently closed. Please advise pt.   Reason for Disposition . SEVERE coughing spells (e.g., whooping sound after coughing, vomiting after coughing)  Answer Assessment - Initial Assessment Questions 1. ONSET: "When did the cough begin?"      2 weeks 2. SEVERITY: "How bad is the cough today?"      Severe 3. SPUTUM: "Describe the color of your sputum" (none, dry cough; clear, white, yellow, green)     Green 4. HEMOPTYSIS: "Are you coughing up any blood?" If so ask: "How much?" (flecks, streaks, tablespoons, etc.)     No 5. DIFFICULTY BREATHING: "Are you having difficulty breathing?" If Yes, ask: "How bad is it?" (e.g., mild, moderate, severe)    - MILD: No SOB at rest, mild SOB with walking, speaks normally in sentences, can lay down, no retractions, pulse < 100.    - MODERATE: SOB at rest, SOB with minimal exertion and prefers to sit, cannot lie down flat, speaks in phrases, mild retractions, audible wheezing, pulse 100-120.    - SEVERE: Very SOB at rest, speaks in single words, struggling to breathe, sitting hunched forward, retractions, pulse > 120      Mild 6. FEVER: "Do you have a fever?" If Yes, ask: "What is your temperature, how was it measured, and when did it start?"     No 7. CARDIAC HISTORY: "Do you have any history of heart disease?" (e.g., heart attack, congestive heart failure)      No 8. LUNG HISTORY: "Do you have any history of lung disease?"  (e.g., pulmonary embolus, asthma, emphysema)     No 9. PE RISK FACTORS: "Do you have a history of blood clots?" (or: recent major surgery, recent prolonged travel, bedridden)     No 10. OTHER SYMPTOMS: "Do you have any other symptoms?" (e.g., runny nose, wheezing, chest pain)        Wheezing, runny nose 11. PREGNANCY: "Is there any chance you are pregnant?" "When was your last menstrual period?"       n/a 12. TRAVEL: "Have you traveled out of the country in the last month?" (e.g., travel history, exposures)       No  Protocols used: [place]

## 2020-02-11 ENCOUNTER — Other Ambulatory Visit: Payer: Self-pay

## 2020-02-11 ENCOUNTER — Telehealth (INDEPENDENT_AMBULATORY_CARE_PROVIDER_SITE_OTHER): Payer: Commercial Managed Care - PPO | Admitting: Family Medicine

## 2020-02-11 ENCOUNTER — Encounter: Payer: Self-pay | Admitting: Family Medicine

## 2020-02-11 DIAGNOSIS — J0110 Acute frontal sinusitis, unspecified: Secondary | ICD-10-CM

## 2020-02-11 MED ORDER — FLUTICASONE PROPIONATE 50 MCG/ACT NA SUSP: 2.0000 | Freq: Every day | NASAL | 3 refills | Status: DC

## 2020-02-11 MED ORDER — AMOXICILLIN-POT CLAVULANATE 875-125 MG PO TABS: 1.0000 | ORAL_TABLET | Freq: Two times a day (BID) | ORAL | 0 refills | Status: DC

## 2020-02-11 NOTE — Progress Notes (Signed)
Virtual Visit via Telephone The purpose of this virtual visit is to provide medical care while limiting exposure to the novel coronavirus (COVID19) for both patient and office staff.  Consent was obtained for phone visit:  Yes.   Answered questions that patient had about telehealth interaction:  Yes.   I discussed the limitations, risks, security and privacy concerns of performing an evaluation and management service by telephone. I also discussed with the patient that there may be a patient responsible charge related to this service. The patient expressed understanding and agreed to proceed.  Patient Location: Home Provider Location: [facility])  ---------------------------------------------------------------------- Chief Complaint  Patient presents with  . Cough    both Covid test negative--denies fever--as per patient tongue is vert sore --productive mucus that make him gag reflex--also Coricidin not improving his Sxs now    S: Reviewed CMA documentation. I have called patient and gathered additional HPI as follows:  Sinusitis / Productive Cough COVID NEGATIVE on repeat testing - Last visit with me 01/29/20 telemedicine, treated with COVID Test negative on 01/27/20 and approx 02/02/20, he had been cleared to work but clinically did not feel resolved and ready to return. Needed new work today - Today reports feels a little worse, especially at night with productive cough and sinus drainage, coughing up thicker colored phlegm - Taking Coricidin PRN for cough - He took some allergy medicine - Already took [place] Vaccine 01/27/20 1st dose, next dose in 4 weeks.  - He was already having symptoms prior to vaccine. Now he has persistent worsening symptoms, question if some side effect. - He is much improved on OTC coricidin HBP Flu with some good results  Patient is currently not working, he is in isolation Denies any known or suspected exposure to person with or  possibly with [facility].  Admits productive cough, sinus drainage and pain pressure Denies any fevers, chills, sweats, body ache, shortness of breath, headache, abdominal pain, diarrhea  Past Medical History:  Diagnosis Date  . Diverticulosis   . Gastric ulcer   . GERD (gastroesophageal reflux disease)   . History of hiatal hernia   . Hypertension   . Iron deficiency anemia 10/18/2019  . 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 Smoking status: Former Smoker    Packs/day: 2.00    Years: 25.00    Pack years: 50.00    Types: Cigarettes    Quit date: 05/31/1987    Years since quitting: 32.7  . Smokeless tobacco: Never Used  Vaping Use  . Vaping Use: Never used  Substance Use Topics  . Alcohol use: Yes    Comment: 8-10 beers per day  . Drug use: No    Current Outpatient Medications:  .  amLODipine-benazepril (LOTREL) 10-40 MG capsule, TAKE 1 CAPSULE BY MOUTH EVERY DAY, Disp: 90 capsule, Rfl: 0 .  dicyclomine (BENTYL) 10 MG capsule, Take 1 capsule (10 mg total) by mouth 4 (four) times daily -  before meals and at bedtime., Disp: 40 capsule, Rfl: 1 .  omeprazole (PRILOSEC) 40 MG capsule, TAKE 1 CAPSULE BY MOUTH TWICE A DAY, Disp: 180 capsule, Rfl: 0 .  Vitamins/Minerals TABS, Take by mouth., Disp: , Rfl:  .  amoxicillin-clavulanate (AUGMENTIN) 875-125 MG tablet, Take 1 tablet by mouth 2 (two) times daily. For 10 days, Disp: 20 tablet, Rfl: 0 .  fluticasone (FLONASE) 50 MCG/ACT nasal spray, Place 2 sprays  into both nostrils daily. Use for 4-6 weeks then stop and use seasonally or as needed., Disp: 16 g, Rfl: 3  Depression screen [facility] 2/9 09/30/2019 11/20/2018 11/08/2016  Decreased Interest 0 0 0  Down, Depressed, Hopeless 0 0 0  PHQ - 2 Score 0 0 0  Altered sleeping - - 0  Tired, decreased energy - - 1  Change in appetite - - 0  Feeling bad or failure about yourself  - - 0  Trouble concentrating - - 0  Moving  slowly or fidgety/restless - - 0  Suicidal thoughts - - 0  PHQ-9 Score - - 1    No flowsheet data found.  -------------------------------------------------------------------------- O: No physical exam performed due to remote telephone encounter.  Lab results reviewed.  Recent Results (from the past 2160 hour(s))  Iron and TIBC     Status: None   Collection Time: 01/01/20  1:29 PM  Result Value Ref Range   Iron 110 45 - 182 ug/dL   TIBC 445 250 - 450 ug/dL   Saturation Ratios 25 17.9 - 39.5 %   UIBC 335 ug/dL    Comment: Performed at [facility], [address] [place], [place] [address]  Ferritin     Status: None   Collection Time: 01/01/20  1:29 PM  Result Value Ref Range   Ferritin 52 24 - 336 ng/mL    Comment: Performed at [facility], [address] [place], [place] [address]  CBC with Differential/Platelet     Status: Abnormal   Collection Time: 01/01/20  1:29 PM  Result Value Ref Range   WBC 9.9 4.0 - 10.5 K/uL   RBC 4.73 4.22 - 5.81 MIL/uL   Hemoglobin 14.5 13.0 - 17.0 g/dL   HCT 40.9 39 - 52 %   MCV 86.5 80.0 - 100.0 fL   MCH 30.7 26.0 - 34.0 pg   MCHC 35.5 30.0 - 36.0 g/dL   RDW 18.5 (H) 11.5 - 15.5 %   Platelets 300 150 - 400 K/uL   nRBC 0.0 0.0 - 0.2 %   Neutrophils Relative % 66 %   Neutro Abs 6.6 1.7 - 7.7 K/uL   Lymphocytes Relative 19 %   Lymphs Abs 1.9 0.7 - 4.0 K/uL   Monocytes Relative 11 %   Monocytes Absolute 1.1 (H) 0 - 1 K/uL   Eosinophils Relative 2 %   Eosinophils Absolute 0.2 0 - 0 K/uL   Basophils Relative 2 %   Basophils Absolute 0.2 (H) 0 - 0 K/uL   Immature Granulocytes 0 %   Abs Immature Granulocytes 0.04 0.00 - 0.07 K/uL    Comment: Performed at [facility], [address] [place], [place] [address]  BCR-ABL1 FISH     Status: None   Collection Time: 01/03/20  2:11 PM  Result Value Ref Range   Specimen Type BLOOD    Cells Counted 200    Cells Analyzed 200    FISH Result Comment:     Comment:  NORMAL:  NO BCR OR ABL1 GENE REARRANGEMENT OBSERVED   Interpretation Comment:     Comment: (NOTE)             nuc ish 9q34(ASS1,ABL1)x2,22q11.2(BCRx2)[200].      The fluorescence in situ hybridization (FISH) study was normal. FISH, using unique sequence DNA probes for the ABL1 and BCR gene regions showed two ABL1 signals (red), two control ASS1 gene signals (aqua) located adjacent to the ABL1 locus at 9q34, and two  BCR signals (green) at 22q11.2 in all interphase nuclei examined. There was NO evidence of CML or ALL-associated BCR/ABL1 dual fusion signals in this analysis. .      This analysis is limited to abnormalities detectable by the specific probes included in the study. FISH results should be interpreted within the context of a full cytogenetic analysis and pathology evaluation. .      This test was developed and its performance characteristics determined by [facility] [employer]). It has not been cleared or approved by the U.S. Food and Drug Administration. A BCR-ABL1 gene fusion in greater than 3 interphase nuclei in a  patient with a new clinical diagnosis is considered positive. The DNA probe vendor for this study was [employer], research (physical sciences)).    Director Review: Comment:     Comment: (NOTE) Chandra Batch, PhD, [facility] Performed At: [facility] [address] [place], [place] [id number] Katina Degree MDPhD WU:[id number]     -------------------------------------------------------------------------- A&P:  Problem List Items Addressed This Visit    None    Visit Diagnoses    Acute non-recurrent frontal sinusitis    -  Primary   Relevant Medications   amoxicillin-clavulanate (AUGMENTIN) 875-125 MG tablet   fluticasone (FLONASE) 50 MCG/ACT nasal spray     Ruled out [place], repeat negative testing, 01/27/20, 02/02/20 S/p 1st vaccine [place] 01/27/20  Consistent with subacute frontal sinusitis, likely initially viral URI vs  allergic rhinitis component with worsening concern for bacterial infection now >3 week duration with second sickening  Plan: 1. Start Augmentin 875-125mg  PO BID x 10 days 2. Start nasal steroid Flonase 2 sprays in each nostril daily for 4-6 weeks, may repeat course seasonally or as needed 3. Supportive care with nasal saline OTC, hydration  Work note, extended, return Thurs 9/16 now, notify office if need to adjust date, if extend further. He was already out due to COVID testing protocol, has been cleared from that stand point now, but still has symptoms of sinusitis. He has no fevers  Return criteria reviewed   Meds ordered this encounter  Medications  . amoxicillin-clavulanate (AUGMENTIN) 875-125 MG tablet    Sig: Take 1 tablet by mouth 2 (two) times daily. For 10 days    Dispense:  20 tablet    Refill:  0  . fluticasone (FLONASE) 50 MCG/ACT nasal spray    Sig: Place 2 sprays into both nostrils daily. Use for 4-6 weeks then stop and use seasonally or as needed.    Dispense:  16 g    Refill:  3    Follow-up: - Return in 1 week if not improved sinusitis  Patient verbalizes understanding with the above medical recommendations including the limitation of remote medical advice.  Specific follow-up and call-back criteria were given for patient to follow-up or seek medical care more urgently if needed.   - Time spent in direct consultation with patient on phone: 10 minutes  Nobie Putnam, [facility] 02/11/2020, 10:19 AM

## 2020-02-11 NOTE — Patient Instructions (Addendum)
1. It sounds like you have a Sinusitis (Bacterial Infection) - this most likely started as an Upper Respiratory Virus that has settled into an infection. Allergies can also cause this. - Start Augmentin 1 pill twice daily (breakfast and dinner, with food and plenty of water) for 10 days, complete entire course, do not stop early even if feeling better - Start nasal steroid Flonase 2 sprays in each nostril daily for 4-6 weeks, may repeat course seasonally or as needed  - Recommend to keep using Nasal Saline spray multiple times a day to help flush out congestion and clear sinuses - Improve hydration by drinking plenty of clear fluids (water, gatorade) to reduce secretions and thin congestion - Congestion draining down throat can cause irritation. May try warm herbal tea with honey, cough drops - Can take Tylenol or Ibuprofen as needed for fevers - May continue over the counter cold medicine as you are, I would not use any decongestant or mucinex longer than 7 days.  Return to work Thursday with note, unless need to change. Contact me.   Please schedule a Follow-up Appointment to: Return in about 1 week (around 02/18/2020), or if symptoms worsen or fail to improve, for sinusitis.  If you have any other questions or concerns, please feel free to call the office or send a message through [place]. You may also schedule an earlier appointment if necessary.  Additionally, you may be receiving a survey about your experience at our office within a few days to 1 week by e-mail or mail. We value your feedback.  Nobie Putnam, DO [place]

## 2020-02-12 ENCOUNTER — Telehealth: Payer: Commercial Managed Care - PPO | Admitting: Family Medicine

## 2020-02-13 ENCOUNTER — Telehealth: Payer: Self-pay

## 2020-02-13 ENCOUNTER — Encounter: Payer: Self-pay | Admitting: Family Medicine

## 2020-02-13 NOTE — Telephone Encounter (Signed)
Copied from [place] [id number]. Topic: General - Inquiry >> Feb 13, 2020  7:10 AM Scherrie Gerlach wrote: Reason for CRM: pt states he feels like he now has bronchitis, and does not feel as though he is up to going back to work today.  He has been coughing and gagging.  Pt states he feels like perhaps Monday would be a better day to return to work.  Pt would like you to extend the work note that he had already.

## 2020-02-13 NOTE — Telephone Encounter (Signed)
Patient informed. 

## 2020-02-13 NOTE — Telephone Encounter (Signed)
New work note written sent to his mychart  Return Monday 9/20  If he needs anything else, should do a virtual or in person visit with myself or nicole.  Nobie Putnam, [facility] 02/13/2020, 8:44 AM

## 2020-02-17 ENCOUNTER — Ambulatory Visit: Payer: Self-pay | Admitting: *Deleted

## 2020-02-17 ENCOUNTER — Emergency Department
Admission: EM | Admit: 2020-02-17 | Discharge: 2020-02-17 | Disposition: A | Discharge status: Home / Self Care | Payer: Commercial Managed Care - PPO | Attending: Emergency Medicine | Admitting: Emergency Medicine

## 2020-02-17 ENCOUNTER — Other Ambulatory Visit: Payer: Self-pay

## 2020-02-17 ENCOUNTER — Emergency Department: Payer: Commercial Managed Care - PPO

## 2020-02-17 DIAGNOSIS — Z87891 Personal history of nicotine dependence: Secondary | ICD-10-CM | POA: Diagnosis not present

## 2020-02-17 DIAGNOSIS — U071 COVID-19: Secondary | ICD-10-CM

## 2020-02-17 DIAGNOSIS — Z79899 Other long term (current) drug therapy: Secondary | ICD-10-CM | POA: Insufficient documentation

## 2020-02-17 DIAGNOSIS — I10 Essential (primary) hypertension: Secondary | ICD-10-CM | POA: Insufficient documentation

## 2020-02-17 DIAGNOSIS — R0602 Shortness of breath: Secondary | ICD-10-CM | POA: Diagnosis present

## 2020-02-17 LAB — COMPREHENSIVE METABOLIC PANEL
ALT: 80 U/L — ABNORMAL HIGH (ref 0–44)
AST: 123 U/L — ABNORMAL HIGH (ref 15–41)
Albumin: 4.4 g/dL (ref 3.5–5.0)
Alkaline Phosphatase: 66 U/L (ref 38–126)
Anion gap: 18 — ABNORMAL HIGH (ref 5–15)
BUN: 7 mg/dL (ref 6–20)
CO2: 18 mmol/L — ABNORMAL LOW (ref 22–32)
Calcium: 9 mg/dL (ref 8.9–10.3)
Chloride: 96 mmol/L — ABNORMAL LOW (ref 98–111)
Creatinine, Ser: 0.59 mg/dL — ABNORMAL LOW (ref 0.61–1.24)
GFR calc Af Amer: >60 mL/min (ref >60)
GFR calc non Af Amer: >60 mL/min (ref >60)
Glucose, Bld: 88 mg/dL (ref 70–99)
Potassium: 4 mmol/L (ref 3.5–5.1)
Sodium: 132 mmol/L — ABNORMAL LOW (ref 135–145)
Total Bilirubin: 1.1 mg/dL (ref 0.3–1.2)
Total Protein: 8.1 g/dL (ref 6.5–8.1)

## 2020-02-17 LAB — BLOOD GAS, VENOUS
Patient temperature: 37
pCO2, Ven: 30 mmHg — ABNORMAL LOW (ref 44.0–60.0)
pH, Ven: 7.47 — ABNORMAL HIGH (ref 7.250–7.430)
pO2, Ven: 62 mmHg — ABNORMAL HIGH (ref 32.0–45.0)

## 2020-02-17 LAB — CBC
HCT: 43.6 % (ref 39.0–52.0)
Hemoglobin: 15.9 g/dL (ref 13.0–17.0)
MCH: 32.9 pg (ref 26.0–34.0)
MCHC: 36.5 g/dL — ABNORMAL HIGH (ref 30.0–36.0)
MCV: 90.3 fL (ref 80.0–100.0)
Platelets: 188 10*3/uL (ref 150–400)
RBC: 4.83 MIL/uL (ref 4.22–5.81)
RDW: 14.6 % (ref 11.5–15.5)
WBC: 10.4 10*3/uL (ref 4.0–10.5)
nRBC: 0 % (ref 0.0–0.2)

## 2020-02-17 LAB — BASIC METABOLIC PANEL
Anion gap: 14 (ref 5–15)
BUN: 6 mg/dL (ref 6–20)
CO2: 20 mmol/L — ABNORMAL LOW (ref 22–32)
Calcium: 8.2 mg/dL — ABNORMAL LOW (ref 8.9–10.3)
Chloride: 96 mmol/L — ABNORMAL LOW (ref 98–111)
Creatinine, Ser: 0.72 mg/dL (ref 0.61–1.24)
GFR calc Af Amer: >60 mL/min (ref >60)
GFR calc non Af Amer: >60 mL/min (ref >60)
Glucose, Bld: 151 mg/dL — ABNORMAL HIGH (ref 70–99)
Potassium: 5.5 mmol/L — ABNORMAL HIGH (ref 3.5–5.1)
Sodium: 130 mmol/L — ABNORMAL LOW (ref 135–145)

## 2020-02-17 LAB — SARS CORONAVIRUS 2 BY RT PCR (HOSPITAL ORDER, PERFORMED IN ~~LOC~~ HOSPITAL LAB): SARS Coronavirus 2: POSITIVE — AB

## 2020-02-17 IMAGING — CR DG CHEST 2V
1 series · 2 of 2 positions shown · non-contrast
Comparison: 05/11/2016

CLINICAL DATA: Cough and tachycardia

EXAM:
CHEST - 2 VIEW

[Series 1: dg chest 2 view · 0.14mm/px · 2 of 2 slices shown]
[im 1/2]
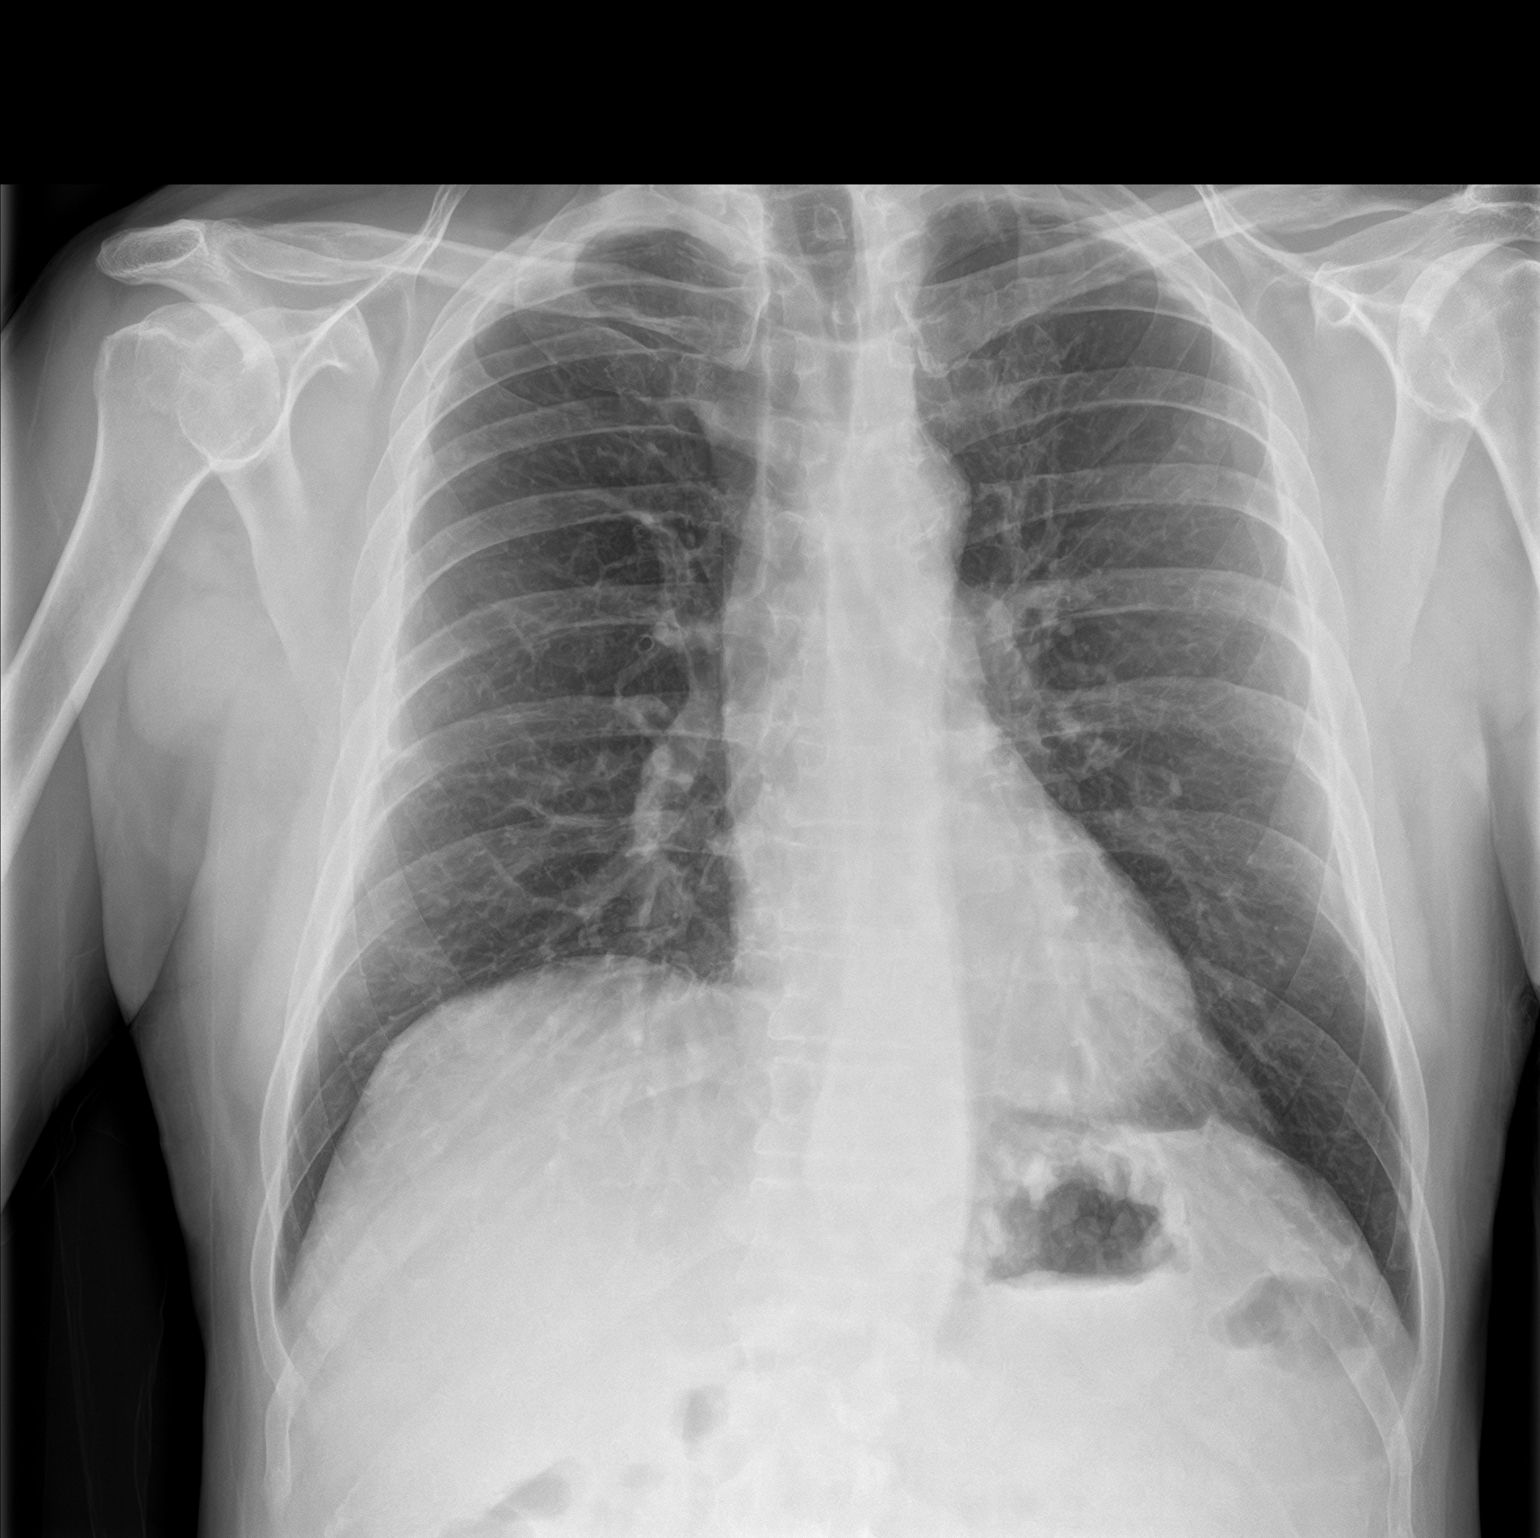
[im 2/2]
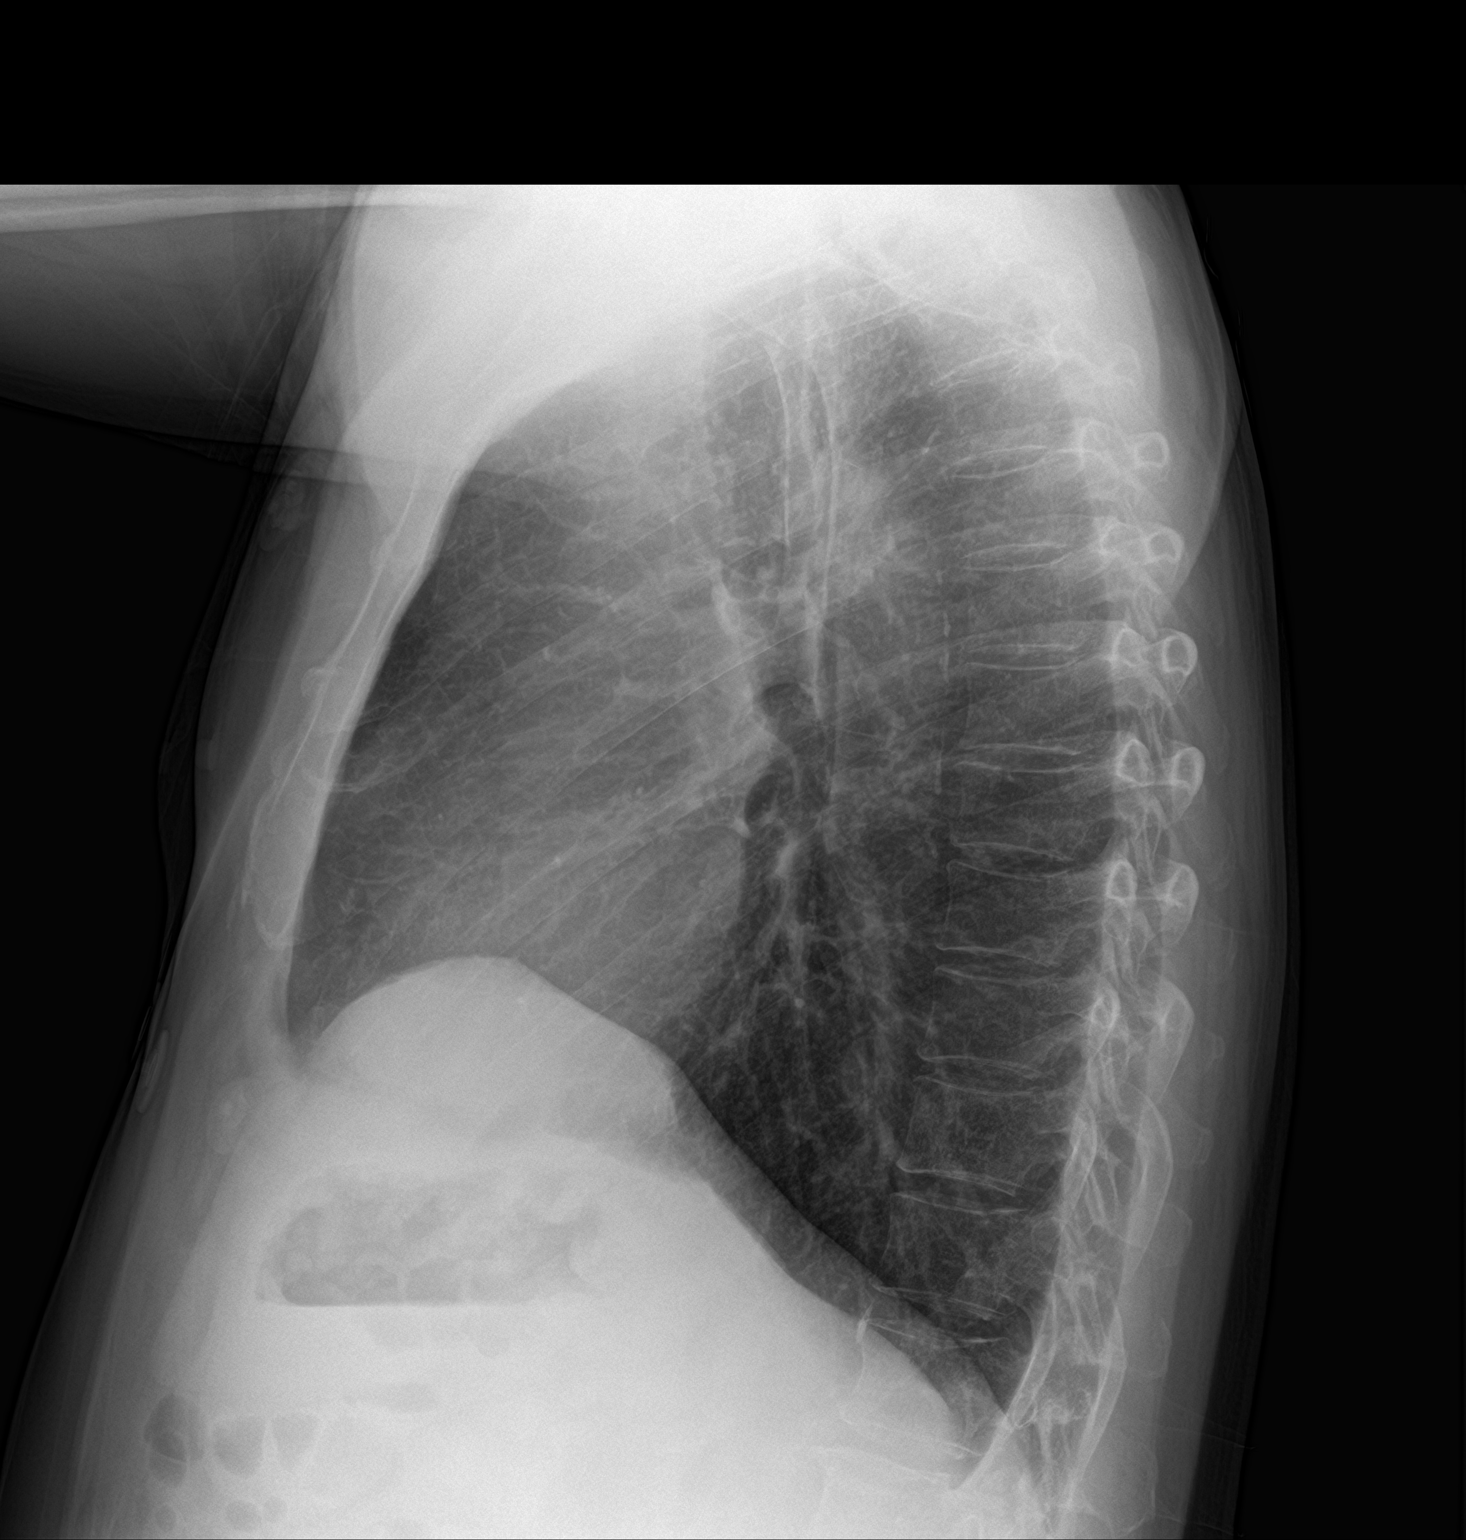

[2 of 2 positions shown; findings below may reference images not displayed]

FINDINGS: Normal heart size and mediastinal contours. No acute infiltrate or
edema. No effusion or pneumothorax. No acute osseous findings.

Artifact from EKG leads.
IMPRESSION: No active cardiopulmonary disease.

## 2020-02-17 MED ORDER — SODIUM CHLORIDE 0.9 % IV BOLUS
1000.0000 mL | Freq: Once | INTRAVENOUS | Status: AC
  Administered 2020-02-17: 1000 mL via INTRAVENOUS

## 2020-02-17 MED ORDER — DIPHENHYDRAMINE HCL 50 MG/ML IJ SOLN
12.5000 mg | Freq: Once | INTRAMUSCULAR | Status: AC
  Administered 2020-02-17: 12.5 mg via INTRAVENOUS
  Filled 2020-02-17: qty 1

## 2020-02-17 MED ORDER — FAMOTIDINE IN NACL 20-0.9 MG/50ML-% IV SOLN
20.0000 mg | Freq: Once | INTRAVENOUS | Status: AC
  Administered 2020-02-17: 20 mg via INTRAVENOUS
  Filled 2020-02-17: qty 50

## 2020-02-17 MED ORDER — PREDNISONE 10 MG (21) PO TBPK: ORAL_TABLET | ORAL | 0 refills | Status: DC

## 2020-02-17 MED ORDER — METHYLPREDNISOLONE SODIUM SUCC 125 MG IJ SOLR
125.0000 mg | Freq: Once | INTRAMUSCULAR | Status: AC
  Administered 2020-02-17: 125 mg via INTRAVENOUS
  Filled 2020-02-17: qty 2

## 2020-02-17 MED ORDER — LORAZEPAM 2 MG/ML IJ SOLN
2.0000 mg | Freq: Once | INTRAMUSCULAR | Status: AC
  Administered 2020-02-17: 2 mg via INTRAVENOUS
  Filled 2020-02-17: qty 1

## 2020-02-17 NOTE — Telephone Encounter (Signed)
Yes, patient is now in [facility] ED for this problem.  Nobie Putnam, [facility] 02/17/2020, 1:13 PM

## 2020-02-17 NOTE — Discharge Instructions (Addendum)
Please return to the ER for shortness of breath or other symptoms of concern. Quarantine until 02/25/20.

## 2020-02-17 NOTE — ED Triage Notes (Signed)
Pt states he was prescribed augmentin for bronchitis and was on it for 4 days when he started having swelling to lip on Friday and then the tongue Saturday and yesterday. States he stopped taking it. States he has been taking benadryl, last was yesterday. No noted facial swelling today, no tongue swelling observed. Pt does take amlodipine for b/p.Luis Porter

## 2020-02-17 NOTE — Telephone Encounter (Signed)
Patient states 02/13/20 the right side of his mouth started swelling. On 02/14/20 his tongue started swelling. He took 25 mg Benadryl at 02/16/20 at1800 due to the swelling. he reports the swelling is now reduced; the patient states he started taking Amoxicillin on 9/17/21and his last dose was AM 02/16/20; he is able to swallow, eat and breathe without difficulty; he says the under side of his tongue is inflamed more than the top; he also states the top layer of his tongue came off; recommendations made per nurse triage protocol; eh verbalized understanding; he is seen by Dr Parks Ranger, [place]; will route to office for notification.  Reason for Disposition . Pain in tongue, mouth, or tooth  Answer Assessment - Initial Assessment Questions 1. ONSET: "When did the swelling start?" (e.g., minutes, hours, days)     02/15/20 morning 0600 2. LOCATION: "What part of the tongue is swollen?"     The entire tongue; underside is worse; otngue also painfulr 3. SEVERITY: "How swollen is it?"     moderate 4. CAUSE: "What do you think is causing the tongue swelling?" (e.g., hx of angioedema, allergies)   Not sure; patient started amoxicillin on 02/12/20 5. RECURRENT SYMPTOM: "Have you had tongue swelling before?" If Yes, ask: "When was the last time?" "What happened that time?"     no 6. OTHER SYMPTOMS: "Do you have any other symptoms?" (e.g., difficulty breathing, facial swelling)     Right side of mouth swollen 7. PREGNANCY: "Is there any chance you are pregnant?" "When was your last menstrual period?"     n/a  Protocols used: TONGUE SWELLING-A-AH

## 2020-02-17 NOTE — Telephone Encounter (Signed)
Looks like patient is in the hospital now.

## 2020-02-17 NOTE — ED Provider Notes (Signed)
[facility] Emergency Department Provider Note ____________________________________________   First MD Initiated Contact with Patient 02/17/20 1155     (approximate)  I have reviewed the triage vital signs and the nursing notes.   HISTORY  Chief Complaint Allergic Reaction  HPI Luis Porter is a 59 y.o. male presents to the emergency department for treatment and evaluation of allergic reaction to Augmentin.  Patient started the medication for his sinus infection and after 3 doses began to have some tongue and lip swelling with wheezing and shortness of breath.  He has taken Benadryl with little relief.         Past Medical History:  Diagnosis Date  . Diverticulosis   . Gastric ulcer   . GERD (gastroesophageal reflux disease)   . History of hiatal hernia   . Hypertension   . Iron deficiency anemia 10/18/2019  . Recurrent umbilical hernia with incarceration 11/08/2016   Previously repaired x 1    Patient Active Problem List   Diagnosis Date Noted  . Diarrhea   . Abdominal pain, epigastric   . Iron deficiency anemia 10/18/2019  . GERD (gastroesophageal reflux disease) 09/19/2019  . Acute diverticulitis 09/19/2019  . Alcohol abuse 09/19/2019  . Essential tremor 07/04/2019  . Hx of adenomatous colonic polyps 01/06/2017  . Herniated lumbar intervertebral disc 05/18/2016  . Mixed hyperlipidemia 05/06/2014  . 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 Insomnia 03/03/2014  . Diverticulosis of large intestine without hemorrhage 03/03/2014  . Alcohol abuse, daily use 03/03/2014  . Essential hypertension 07/08/2011  . Hiatal hernia with gastroesophageal reflux 07/07/2011  . Lumbago with sciatica 07/07/2011    Past Surgical History:  Procedure Laterality Date  . BACK SURGERY    . COLONOSCOPY WITH PROPOFOL N/A 12/30/2016   Procedure: COLONOSCOPY WITH PROPOFOL;  Surgeon: [name], MD;  Location: [facility] ENDOSCOPY;   Service: Endoscopy;  Laterality: N/A;  . COLONOSCOPY WITH PROPOFOL N/A 10/31/2019   Procedure: COLONOSCOPY WITH PROPOFOL;  Surgeon: Lin Landsman, MD;  Location: [facility] ENDOSCOPY;  Service: Gastroenterology;  Laterality: N/A;  . ESOPHAGOGASTRODUODENOSCOPY (EGD) WITH PROPOFOL N/A 12/30/2016   Procedure: ESOPHAGOGASTRODUODENOSCOPY (EGD) WITH PROPOFOL;  Surgeon: [name], MD;  Location: [facility] ENDOSCOPY;  Service: Endoscopy;  Laterality: N/A;  . ESOPHAGOGASTRODUODENOSCOPY (EGD) WITH PROPOFOL N/A 10/31/2019   Procedure: ESOPHAGOGASTRODUODENOSCOPY (EGD) WITH PROPOFOL;  Surgeon: Lin Landsman, MD;  Location: [facility] ENDOSCOPY;  Service: Gastroenterology;  Laterality: N/A;  . HERNIA REPAIR  2229   Umbilical Hernia Repair  . LUMBAR LAMINECTOMY/DECOMPRESSION MICRODISCECTOMY Right 05/18/2016   Procedure: right L4-5 microdiscectomy;  Surgeon: Blanche East, MD;  Location: [facility] ORS;  Service: Neurosurgery;  Laterality: Right;  . TONSILLECTOMY    . UMBILICAL HERNIA REPAIR N/A 06/29/2018   Procedure: LAPAROSCOPIC REPAIR OF RECURRENT UMBILICAL HERNIA WITH MESH;  Surgeon: Vickie Epley, MD;  Location: [facility] ORS;  Service: General;  Laterality: N/A;    Prior to Admission medications   Medication Sig Start Date End Date Taking? Authorizing Provider  amLODipine-benazepril (LOTREL) 10-40 MG capsule TAKE 1 CAPSULE BY MOUTH EVERY DAY 12/16/19   Parks Ranger, [name], DO  amoxicillin-clavulanate (AUGMENTIN) 875-125 MG tablet Take 1 tablet by mouth 2 (two) times daily. For 10 days 02/11/20   Olin Hauser, DO  dicyclomine (BENTYL) 10 MG capsule Take 1 capsule (10 mg total) by mouth 4 (four) times daily -  before meals and at bedtime. 10/07/19   Karamalegos, [name], DO  fluticasone (FLONASE) 50 MCG/ACT nasal spray Place 2  sprays into both nostrils daily. Use for 4-6 weeks then stop and use seasonally or as needed. 02/11/20   Karamalegos, [name], DO  omeprazole (PRILOSEC) 40 MG capsule  TAKE 1 CAPSULE BY MOUTH TWICE A DAY 12/16/19   Karamalegos, [name], DO  predniSONE (STERAPRED UNI-PAK 21 TAB) 10 MG (21) TBPK tablet Take 6 tablets on the first day and decrease by 1 tablet each day until finished. 02/17/20   Karmyn Lowman, Dessa Phi, FNP  Vitamins/Minerals TABS Take by mouth.    [provider]    Allergies Nsaids  Family History  Problem Relation Age of Onset  . Hypertension Mother   . Cancer Mother        Ovarian  . Cervical cancer Mother   . Hypertension Father   . Cancer Father        tongue  . Throat cancer Father   . Hypertension Brother   . Prostate cancer Neg Hx   . Breast cancer Neg Hx   . Colon cancer Neg Hx   . Heart attack Neg Hx   . Stroke Neg Hx     Social History Social History   Tobacco Use  . Smoking status: Former Smoker    Packs/day: 2.00    Years: 25.00    Pack years: 50.00    Types: Cigarettes    Quit date: 05/31/1987    Years since quitting: 32.7  . Smokeless tobacco: Never Used  Vaping Use  . Vaping Use: Never used  Substance Use Topics  . Alcohol use: Yes    Comment: 8-10 beers per day  . Drug use: No    Review of Systems  Constitutional: No fever/chills Eyes: No visual changes. ENT: No sore throat. Cardiovascular: Denies chest pain. Respiratory: Slight shortness of breath. Gastrointestinal: No abdominal pain.  No nausea, no vomiting.  No diarrhea.  No constipation. Genitourinary: Negative for dysuria. Musculoskeletal: Negative for back pain. Skin: Negative for rash. Neurological: Negative for headaches, focal weakness or numbness. ____________________________________________   PHYSICAL EXAM:  VITAL SIGNS: ED Triage Vitals  Enc Vitals Group     BP 02/17/20 0902 (!) 139/98     Pulse Rate 02/17/20 0902 (!) 134     Resp 02/17/20 0902 18     Temp 02/17/20 0902 98.2 F (36.8 C)     Temp Source 02/17/20 0902 Oral     SpO2 02/17/20 0902 94 %     Weight 02/17/20 0904 150 lb (68 kg)     Height 02/17/20 0904  5\' 6"  (1.676 m)     Head Circumference --      Peak Flow --      Pain Score 02/17/20 0904 3     Pain Loc --      Pain Edu? --      Excl. in [place]? --     Constitutional: Alert and oriented. Well appearing and in no acute distress. Eyes: Conjunctivae are normal.  Head: Atraumatic. Nose: No congestion/rhinnorhea. Mouth/Throat: Mucous membranes are moist.  Oropharynx non-erythematous. Neck: No stridor.   Hematological/Lymphatic/Immunilogical: No cervical lymphadenopathy. Cardiovascular: Normal rate, regular rhythm. Grossly normal heart sounds.  Good peripheral circulation. Respiratory: Normal respiratory effort.  No retractions. Lungs CTAB. Gastrointestinal: Soft and nontender. No distention. No abdominal bruits. No CVA tenderness. Genitourinary:  Musculoskeletal: No lower extremity tenderness nor edema.  No joint effusions. Neurologic:  Normal speech and language. No gross focal neurologic deficits are appreciated. No gait instability. Skin:  Skin is warm, dry and intact. No rash  noted. Psychiatric: Mood and affect are normal. Speech and behavior are normal.  ____________________________________________   LABS (all labs ordered are listed, but only abnormal results are displayed)  Labs Reviewed  SARS CORONAVIRUS 2 BY RT PCR ([facility]) - Abnormal; Notable for the following components:      Result Value   SARS Coronavirus 2 POSITIVE (*)    All other components within normal limits  CBC - Abnormal; Notable for the following components:   MCHC 36.5 (*)    All other components within normal limits  COMPREHENSIVE METABOLIC PANEL - Abnormal; Notable for the following components:   Sodium 132 (*)    Chloride 96 (*)    CO2 18 (*)    Creatinine, Ser 0.59 (*)    AST 123 (*)    ALT 80 (*)    Anion gap 18 (*)    All other components within normal limits  BASIC METABOLIC PANEL - Abnormal; Notable for the following components:   Sodium 130 (*)     Potassium 5.5 (*)    Chloride 96 (*)    CO2 20 (*)    Glucose, Bld 151 (*)    Calcium 8.2 (*)    All other components within normal limits  BLOOD GAS, VENOUS - Abnormal; Notable for the following components:   pH, Ven 7.47 (*)    pCO2, Ven 30 (*)    pO2, Ven 62.0 (*)    All other components within normal limits   ____________________________________________  EKG  Not indicated. ____________________________________________  RADIOLOGY  ED MD interpretation:    Chest x-ray negative for acute findings.  I, Sherrie George, personally viewed and evaluated these images (plain radiographs) as part of my medical decision making, as well as reviewing the written report by the radiologist.  Official radiology report(s): DG Chest 2 View  Result Date: 02/17/2020 CLINICAL DATA:  Cough and tachycardia EXAM: CHEST - 2 VIEW COMPARISON:  05/11/2016 FINDINGS: Normal heart size and mediastinal contours. No acute infiltrate or edema. No effusion or pneumothorax. No acute osseous findings. Artifact from EKG leads. IMPRESSION: No active cardiopulmonary disease. Electronically Signed   By: Monte Fantasia M.D.   On: 02/17/2020 09:46    ____________________________________________   PROCEDURES  Procedure(s) performed (including Critical Care):  Procedures  ____________________________________________   INITIAL IMPRESSION / ASSESSMENT AND PLAN     59 year old male presenting to the emergency department for treatment and evaluation of symptoms as described in the HPI.  DIFFERENTIAL DIAGNOSIS  Allergic reaction  ED COURSE  Labs drawn while awaiting ER room assignment show a sodium of 132 with a bicarb of 18 and AST at 123 with an ALT of 80.  Alkaline phosphatase is normal and bilirubin is normal anion gap is 18.  Chest x-ray that was performed due to to tachycardia shows no acute cardiopulmonary abnormalities per radiology.  Plan will be to give him 2 L of fluid and recheck a BMP to  make sure that the anion gap has closed and bicarb is improving.  After IV fluids and medications, the patient no longer feels that his tongue is hitting the back of his throat.  Voice is normal.  Awaiting second liter of fluids to finish and will then repeat BMP.  Anion gap is now 2:14 liters of fluids.  Patient does remain tachycardic but he has not had any alcohol this morning.  Plan will be to give him 1 additional liter of fluid and 2 mg of  Ativan.  Airway remains patent and he denies feeling short of breath.  Patient is Covid positive.  Sensation of tongue feeling bigger may likely be consistent with COVID-19 instead of allergy to Augmentin.  Heart rate has come down to 107.  Oxygen saturation 93% on room air.  Patient will be discharged home with a prescription for prednisone.  Quarantine instructions were given as well as strict ER return precautions.  Work note provided for quarantine for him and his wife.    ___________________________________________   FINAL CLINICAL IMPRESSION(S) / ED DIAGNOSES  Final diagnoses:  COVID-19     ED Discharge Orders         Ordered    predniSONE (STERAPRED UNI-PAK 21 TAB) 10 MG (21) TBPK tablet        02/17/20 1833           Grace Haggart Arseneault was evaluated in Emergency Department on 02/17/2020 for the symptoms described in the history of present illness. He was evaluated in the context of the global COVID-19 pandemic, which necessitated consideration that the patient might be at risk for infection with the SARS-CoV-2 virus that causes COVID-19. Institutional protocols and algorithms that pertain to the evaluation of patients at risk for COVID-19 are in a state of rapid change based on information released by regulatory bodies including the CDC and federal and state organizations. These policies and algorithms were followed during the patient's care in the ED.   Note:  This document was prepared using Dragon voice recognition software and  may include unintentional dictation errors.   Victorino Dike, FNP 02/17/20 [place], MD 02/18/20 [id number]

## 2020-02-18 ENCOUNTER — Telehealth: Payer: Self-pay

## 2020-02-18 ENCOUNTER — Telehealth: Payer: Self-pay | Admitting: Internal Medicine

## 2020-02-18 NOTE — Telephone Encounter (Signed)
Called to Discuss with patient about Covid symptoms and the use of the monoclonal antibody infusion for those with mild to moderate Covid symptoms and at a high risk of hospitalization.     It appears pt is out of 10 day window for this infusion due. Attempted to verify by speaking to pt. No answer on phone. Will send MyChart message.   Alan Ripper, [place]

## 2020-02-18 NOTE — Telephone Encounter (Signed)
FYI He was out of work on 01/27/20 due to covid exposure and testing, he tested negative on 8/30 and 9/5.  He had bronchitis / sinus symptoms later, he was kept out of work from 01/27/20 through 9/19 and return on 02/17/20.  However now it appears on 9/20 he went to ED and tested positive for COVID.  Just FYI for when you speak wit him, will likely need new work note, out 10 days from date of positive test, however his timeline is tricky because if you go based on first onset symptoms he is likely cleared by now.  It would depend on his current symptoms, if fever or respiratory concerns.  Hope this helps.  Nobie Putnam, DO [facility] 02/18/2020, 12:32 PM

## 2020-02-18 NOTE — Telephone Encounter (Signed)
Copied from [place] [id number]. Topic: General - Other >> Feb 18, 2020 10:38 AM Keene Breath wrote: Reason for CRM: Patient called to inform the nurse and doctor that he did test positive for covid.  Would like to know if the nurse could call to discuss further.  CB# [phone]

## 2020-02-19 ENCOUNTER — Telehealth (INDEPENDENT_AMBULATORY_CARE_PROVIDER_SITE_OTHER): Payer: Commercial Managed Care - PPO | Admitting: Family Medicine

## 2020-02-19 ENCOUNTER — Other Ambulatory Visit: Payer: Self-pay

## 2020-02-19 ENCOUNTER — Encounter: Payer: Self-pay | Admitting: Family Medicine

## 2020-02-19 DIAGNOSIS — U071 COVID-19: Secondary | ICD-10-CM | POA: Insufficient documentation

## 2020-02-19 DIAGNOSIS — R05 Cough: Secondary | ICD-10-CM

## 2020-02-19 DIAGNOSIS — R059 Cough, unspecified: Secondary | ICD-10-CM | POA: Insufficient documentation

## 2020-02-19 MED ORDER — ALBUTEROL SULFATE HFA 108 (90 BASE) MCG/ACT IN AERS: 1.0000 | INHALATION_SPRAY | RESPIRATORY_TRACT | 1 refills | Status: DC | PRN

## 2020-02-19 MED ORDER — GUAIFENESIN ER 600 MG PO TB12: 1200.0000 mg | ORAL_TABLET | Freq: Two times a day (BID) | ORAL | 0 refills | Status: AC

## 2020-02-19 NOTE — Telephone Encounter (Signed)
OV completed 02/19/2020

## 2020-02-19 NOTE — Assessment & Plan Note (Signed)
COVID19 +, dx 02/17/2020 through [facility] ER.  Has been having symptoms of raspy voice with productive cough.  Has been taking flonase, prednisone and coriciden for his symptoms with minimal improvement, believes that his cough and mucus production may be worse than it was when he was at the hospital.  Discussed adding in a mucinex and an albuterol inhaler to take as directed and if having worsening of symptoms that are not responsive to his current treatment plan to return to the hospital for re-evaluation, patient agreeable with treatment plan.  RTW note placed through 03/02/2020.  Plan: 1. Continue all medications as directed 2. Begin mucinex 1200mg  twice daily for helping to clear mucus 3. Begin albuterol inhaler 1-2 puffs every 4-6 hours as needed for cough, congestion and/or shortness of breath 4. Strict ER precautions provided 5. RTC prior to 03/02/2020 if needing to discuss RTW restrictions/work note

## 2020-02-19 NOTE — Progress Notes (Signed)
Virtual Visit via Telephone  The purpose of this virtual visit is to provide medical care while limiting exposure to the novel coronavirus (COVID19) for both patient and office staff.  Consent was obtained for phone visit:  Yes.   Answered questions that patient had about telehealth interaction:  Yes.   I discussed the limitations, risks, security and privacy concerns of performing an evaluation and management service by telephone. I also discussed with the patient that there may be a patient responsible charge related to this service. The patient expressed understanding and agreed to proceed.  Patient is at home and is accessed via telephone Services are provided by Harlin Rain, FNP-C from [facility])  ---------------------------------------------------------------------- Chief Complaint  Patient presents with  . Covid Positive    raspy voice, tongue swollen, fever, coughing, and cheeks swollen x 3 weeks. w/ several negative test until 2 days ago. The pt was seen in the emergency room.    S: Reviewed CMA documentation. I have called patient and gathered additional HPI as follows:  Luis Porter presents to clinic today for follow up on his recent positive COVID test on 02/17/2020.  Reports he has a raspy voice and some coughing with mucus production.  Denies current fever, sore throat, change in taste/smell, SOB, CP, abdominal pain, n/v/d.  Reports he has been in quarantine since his positive COVID test.  Has been taking the prednisone that was prescribed to him through the emergency department 2 days ago and feels that he may be a little bit worse than he was at the ER.    Patient is currently in isolation Denies any high risk travel to areas of current concern for COVID19. Denies any known or suspected exposure to person with or possibly with COVID19.  Denies any fevers, chills, sweats, body ache, cough, shortness of breath, sinus pain or pressure, headache,  abdominal pain, diarrhea  Past Medical History:  Diagnosis Date  . Diverticulosis   . Gastric ulcer   . GERD (gastroesophageal reflux disease)   . History of hiatal hernia   . Hypertension   . Iron deficiency anemia 10/18/2019  . 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 Smoking status: Former Smoker    Packs/day: 2.00    Years: 25.00    Pack years: 50.00    Types: Cigarettes    Quit date: 05/31/1987    Years since quitting: 32.7  . Smokeless tobacco: Never Used  Vaping Use  . Vaping Use: Never used  Substance Use Topics  . Alcohol use: Yes    Comment: 8-10 beers per day  . Drug use: No    Current Outpatient Medications:  .  amLODipine-benazepril (LOTREL) 10-40 MG capsule, TAKE 1 CAPSULE BY MOUTH EVERY DAY, Disp: 90 capsule, Rfl: 0 .  dicyclomine (BENTYL) 10 MG capsule, Take 1 capsule (10 mg total) by mouth 4 (four) times daily -  before meals and at bedtime., Disp: 40 capsule, Rfl: 1 .  fluticasone (FLONASE) 50 MCG/ACT nasal spray, Place 2 sprays into both nostrils daily. Use for 4-6 weeks then stop and use seasonally or as needed., Disp: 16 g, Rfl: 3 .  omeprazole (PRILOSEC) 40 MG capsule, TAKE 1 CAPSULE BY MOUTH TWICE A DAY, Disp: 180 capsule, Rfl: 0 .  predniSONE (STERAPRED UNI-PAK 21 TAB) 10 MG (21) TBPK tablet, Take 6 tablets on the first day and decrease by 1 tablet each day  until finished., Disp: 21 tablet, Rfl: 0 .  Vitamins/Minerals TABS, Take by mouth., Disp: , Rfl:  .  albuterol (PROAIR HFA) 108 (90 Base) MCG/ACT inhaler, Inhale 1-2 puffs into the lungs every 4 (four) hours as needed for wheezing or shortness of breath., Disp: 8 g, Rfl: 1 .  guaiFENesin (MUCINEX) 600 MG 12 hr tablet, Take 2 tablets (1,200 mg total) by mouth 2 (two) times daily for 10 days., Disp: 40 tablet, Rfl: 0  Depression screen [facility] 2/9 09/30/2019 11/20/2018 11/08/2016  Decreased Interest 0 0 0  Down, Depressed, Hopeless 0 0  0  PHQ - 2 Score 0 0 0  Altered sleeping - - 0  Tired, decreased energy - - 1  Change in appetite - - 0  Feeling bad or failure about yourself  - - 0  Trouble concentrating - - 0  Moving slowly or fidgety/restless - - 0  Suicidal thoughts - - 0  PHQ-9 Score - - 1    No flowsheet data found.  -------------------------------------------------------------------------- O: No physical exam performed due to remote telephone encounter.  Physical Exam: Patient remotely monitored without video.  Verbal communication appropriate.  Cognition normal.  Recent Results (from the past 2160 hour(s))  Iron and TIBC     Status: None   Collection Time: 01/01/20  1:29 PM  Result Value Ref Range   Iron 110 45 - 182 ug/dL   TIBC 445 250 - 450 ug/dL   Saturation Ratios 25 17.9 - 39.5 %   UIBC 335 ug/dL    Comment: Performed at [facility], [address] [place], [place] [address]  Ferritin     Status: None   Collection Time: 01/01/20  1:29 PM  Result Value Ref Range   Ferritin 52 24 - 336 ng/mL    Comment: Performed at [facility], [address] [place], [place] [address]  CBC with Differential/Platelet     Status: Abnormal   Collection Time: 01/01/20  1:29 PM  Result Value Ref Range   WBC 9.9 4.0 - 10.5 K/uL   RBC 4.73 4.22 - 5.81 MIL/uL   Hemoglobin 14.5 13.0 - 17.0 g/dL   HCT 40.9 39 - 52 %   MCV 86.5 80.0 - 100.0 fL   MCH 30.7 26.0 - 34.0 pg   MCHC 35.5 30.0 - 36.0 g/dL   RDW 18.5 (H) 11.5 - 15.5 %   Platelets 300 150 - 400 K/uL   nRBC 0.0 0.0 - 0.2 %   Neutrophils Relative % 66 %   Neutro Abs 6.6 1.7 - 7.7 K/uL   Lymphocytes Relative 19 %   Lymphs Abs 1.9 0.7 - 4.0 K/uL   Monocytes Relative 11 %   Monocytes Absolute 1.1 (H) 0 - 1 K/uL   Eosinophils Relative 2 %   Eosinophils Absolute 0.2 0 - 0 K/uL   Basophils Relative 2 %   Basophils Absolute 0.2 (H) 0 - 0 K/uL   Immature Granulocytes 0 %   Abs Immature Granulocytes 0.04 0.00 - 0.07 K/uL    Comment:  Performed at [facility], [address] [place], [place] [address]  BCR-ABL1 FISH     Status: None   Collection Time: 01/03/20  2:11 PM  Result Value Ref Range   Specimen Type BLOOD    Cells Counted 200    Cells Analyzed 200    FISH Result Comment:     Comment: NORMAL:  NO BCR OR ABL1 GENE REARRANGEMENT OBSERVED   Interpretation  Comment:     Comment: (NOTE)             nuc ish 9q34(ASS1,ABL1)x2,22q11.2(BCRx2)[200].      The fluorescence in situ hybridization (FISH) study was normal. FISH, using unique sequence DNA probes for the ABL1 and BCR gene regions showed two ABL1 signals (red), two control ASS1 gene signals (aqua) located adjacent to the ABL1 locus at 9q34, and two BCR signals (green) at 22q11.2 in all interphase nuclei examined. There was NO evidence of CML or ALL-associated BCR/ABL1 dual fusion signals in this analysis. .      This analysis is limited to abnormalities detectable by the specific probes included in the study. FISH results should be interpreted within the context of a full cytogenetic analysis and pathology evaluation. .      This test was developed and its performance characteristics determined by [facility] [employer]). It has not been cleared or approved by the U.S. Food and Drug Administration. A BCR-ABL1 gene fusion in greater than 3 interphase nuclei in a  patient with a new clinical diagnosis is considered positive. The DNA probe vendor for this study was [employer], research (physical sciences)).    Director Review: Comment:     Comment: (NOTE) Chandra Batch, PhD, [facility] Performed At: [facility] [place], [place] [id number] Katina Degree MDPhD [id number]   CBC     Status: Abnormal   Collection Time: 02/17/20  9:33 AM  Result Value Ref Range   WBC 10.4 4.0 - 10.5 K/uL   RBC 4.83 4.22 - 5.81 MIL/uL   Hemoglobin 15.9 13.0 - 17.0 g/dL   HCT 43.6 39 - 52 %   MCV 90.3 80.0 - 100.0 fL   MCH 32.9  26.0 - 34.0 pg   MCHC 36.5 (H) 30.0 - 36.0 g/dL   RDW 14.6 11.5 - 15.5 %   Platelets 188 150 - 400 K/uL   nRBC 0.0 0.0 - 0.2 %    Comment: Performed at [facility], [address] [place], [place] [address]  Comprehensive metabolic panel     Status: Abnormal   Collection Time: 02/17/20  9:33 AM  Result Value Ref Range   Sodium 132 (L) 135 - 145 mmol/L   Potassium 4.0 3.5 - 5.1 mmol/L   Chloride 96 (L) 98 - 111 mmol/L   CO2 18 (L) 22 - 32 mmol/L   Glucose, Bld 88 70 - 99 mg/dL    Comment: Glucose reference range applies only to samples taken after fasting for at least 8 hours.   BUN 7 6 - 20 mg/dL   Creatinine, Ser 0.59 (L) 0.61 - 1.24 mg/dL   Calcium 9.0 8.9 - 10.3 mg/dL   Total Protein 8.1 6.5 - 8.1 g/dL   Albumin 4.4 3.5 - 5.0 g/dL   AST 123 (H) 15 - 41 U/L   ALT 80 (H) 0 - 44 U/L   Alkaline Phosphatase 66 38 - 126 U/L   Total Bilirubin 1.1 0.3 - 1.2 mg/dL   GFR calc non Af Amer >60 >60 mL/min   GFR calc Af Amer >60 >60 mL/min   Anion gap 18 (H) 5 - 15    Comment: Performed at [facility], [address] [place], [place] [address]  Basic metabolic panel     Status: Abnormal   Collection Time: 02/17/20  4:26 PM  Result Value Ref Range   Sodium 130 (L) 135 - 145 mmol/L   Potassium  5.5 (H) 3.5 - 5.1 mmol/L   Chloride 96 (L) 98 - 111 mmol/L   CO2 20 (L) 22 - 32 mmol/L   Glucose, Bld 151 (H) 70 - 99 mg/dL    Comment: Glucose reference range applies only to samples taken after fasting for at least 8 hours.   BUN 6 6 - 20 mg/dL   Creatinine, Ser 0.72 0.61 - 1.24 mg/dL   Calcium 8.2 (L) 8.9 - 10.3 mg/dL   GFR calc non Af Amer >60 >60 mL/min   GFR calc Af Amer >60 >60 mL/min   Anion gap 14 5 - 15    Comment: Performed at [facility], [address] [place], [place] [address]  SARS Coronavirus 2 by RT PCR (hospital order, performed in [facility] hospital lab) Nasopharyngeal Nasopharyngeal Swab     Status: Abnormal   Collection Time: 02/17/20   4:58 PM   Specimen: Nasopharyngeal Swab  Result Value Ref Range   SARS Coronavirus 2 POSITIVE (A) NEGATIVE    Comment: RESULT CALLED TO, READ BACK BY AND VERIFIED WITH: HEATHER FISHER 02/17/20 AT 1821 BY ACR (NOTE) SARS-CoV-2 target nucleic acids are DETECTED  SARS-CoV-2 RNA is generally detectable in upper respiratory specimens  during the acute phase of infection.  Positive results are indicative  of the presence of the identified virus, but do not rule out bacterial infection or co-infection with other pathogens not detected by the test.  Clinical correlation with patient history and  other diagnostic information is necessary to determine patient infection status.  The expected result is negative.  Fact Sheet for Patients:   StrictlyIdeas.no   Fact Sheet for Healthcare Providers:   BankingDealers.co.za    This test is not yet approved or cleared by the [place] FDA and  has been authorized for detection and/or diagnosis of SARS-CoV-2 by FDA under an Emergency Use Authorization (EUA).  This EUA will remain in effect (meaning this  test can be used) for the duration of  the COVID-19 declaration under Section 564(b)(1) of the Act, 21 U.S.C. section 360-bbb-3(b)(1), unless the authorization is terminated or revoked sooner.  Performed at [facility], [address] [place], [place] [address]   Blood gas, venous     Status: Abnormal   Collection Time: 02/17/20  5:25 PM  Result Value Ref Range   pH, Ven 7.47 (H) 7.25 - 7.43   pCO2, Ven 30 (L) 44 - 60 mmHg   pO2, Ven 62.0 (H) 32 - 45 mmHg   Patient temperature 37.0    Collection site VENOUS    Sample type VENOUS     Comment: Performed at [facility], [address] [place], [place] [address]    -------------------------------------------------------------------------- A&P:  Problem List Items Addressed This Visit      Other   MPNTI-14 - Primary     COVID19 +, dx 02/17/2020 through [facility] ER.  Has been having symptoms of raspy voice with productive cough.  Has been taking flonase, prednisone and coriciden for his symptoms with minimal improvement, believes that his cough and mucus production may be worse than it was when he was at the hospital.  Discussed adding in a mucinex and an albuterol inhaler to take as directed and if having worsening of symptoms that are not responsive to his current treatment plan to return to the hospital for re-evaluation, patient agreeable with treatment plan.  RTW note placed through 03/02/2020.  Plan: 1. Continue all medications as directed 2. Begin mucinex  1200mg  twice daily for helping to clear mucus 3. Begin albuterol inhaler 1-2 puffs every 4-6 hours as needed for cough, congestion and/or shortness of breath 4. Strict ER precautions provided 5. RTC prior to 03/02/2020 if needing to discuss RTW restrictions/work note      Relevant Medications   albuterol (PROAIR HFA) 108 (90 Base) MCG/ACT inhaler   guaiFENesin (MUCINEX) 600 MG 12 hr tablet   Cough    See COVID19 A/P      Relevant Medications   albuterol (PROAIR HFA) 108 (90 Base) MCG/ACT inhaler   guaiFENesin (MUCINEX) 600 MG 12 hr tablet      Meds ordered this encounter  Medications  . albuterol (PROAIR HFA) 108 (90 Base) MCG/ACT inhaler    Sig: Inhale 1-2 puffs into the lungs every 4 (four) hours as needed for wheezing or shortness of breath.    Dispense:  8 g    Refill:  1  . guaiFENesin (MUCINEX) 600 MG 12 hr tablet    Sig: Take 2 tablets (1,200 mg total) by mouth 2 (two) times daily for 10 days.    Dispense:  40 tablet    Refill:  0    Follow-up: - Return before 03/02/2020, if believes needs to discuss work restrictions and return to work plan  Patient verbalizes understanding with the above medical recommendations including the limitation of remote medical advice.  Specific follow-up and call-back criteria were given for patient to  follow-up or seek medical care more urgently if needed.  - Time spent in direct consultation with patient on phone: 9 minutes  Harlin Rain, [facility] 02/19/2020, 3:41 PM

## 2020-02-19 NOTE — Patient Instructions (Signed)
Continue your medications as directed.  I have sent in a prescription for mucinex 600mg  tablets, to take 2 tablets every 12 hours to help thin mucus.  This may make you cough a bit more or increase your post-nasal drip/runny nose.  This is a normal process for this medication.  It is thinning the mucus to get out of your system.  I have sent in a prescription for an albuterol inhaler to take 1-2 puffs every 4-6 hours as needed for cough, shortness of breath and/or wheezing  If you begin to have worsening shortness of breath, chest pain, fever over 104 that is not responsive to ibuprofen and/or acetaminophen, or impending sense of doom to [place]!  We will plan to see you back if your symptoms worsen or fail to improve  You will receive a survey after today's visit either digitally by e-mail or paper by USPS mail. Your experiences and feedback matter to [place].  Please respond so we know how we are doing as we provide care for you.  Call us with any questions/concerns/needs.  It is my goal to be available to you for your health concerns.  Thanks for choosing me to be a partner in your healthcare needs!  Harlin Rain, FNP-C Family Nurse Practitioner [facility] Phone: [phone]

## 2020-02-19 NOTE — Assessment & Plan Note (Signed)
See COVID19 A/P

## 2020-02-25 ENCOUNTER — Other Ambulatory Visit: Payer: Self-pay | Admitting: Family Medicine

## 2020-02-25 DIAGNOSIS — R110 Nausea: Secondary | ICD-10-CM

## 2020-02-25 NOTE — Telephone Encounter (Signed)
Requested medication (s) are due for refill today: yes  Requested medication (s) are on the active medication list: no  Last refill: 10/03/19  Future visit scheduled: no  Notes to clinic:  not delegated   Requested Prescriptions  Pending Prescriptions Disp Refills   promethazine (PHENERGAN) 25 MG tablet [Pharmacy Med Name: PROMETHAZINE 25 MG TABLET] 30 tablet 0    Sig: TAKE 1/2 TO 1 TABLET BY MOUTH EVERY 8 (EIGHT) HOURS AS NEEDED FOR NAUSEA OR VOMITING.      Not Delegated - Gastroenterology: Antiemetics Failed - 02/25/2020 10:47 AM      Failed - This refill cannot be delegated      Passed - Valid encounter within last 6 months    Recent Outpatient Visits           6 days ago [facility] [place], [name], [place]   2 weeks ago Acute non-recurrent frontal sinusitis   [facility], [name], DO   3 weeks ago Suspected COVID-19 virus infection   [place], DO   4 months ago Non-intractable vomiting with nausea, unspecified vomiting type   [place], DO   4 months ago Nausea   [place], [name], [place]

## 2020-02-28 ENCOUNTER — Telehealth: Payer: Self-pay

## 2020-02-28 NOTE — Telephone Encounter (Signed)
Note printed to the front desk.  Have you seen any FMLA paperwork for this patient?

## 2020-02-28 NOTE — Telephone Encounter (Signed)
Copied from [place] [id number]. Topic: General - Other >> Feb 28, 2020  9:39 AM Rainey Pines A wrote: Patient stated that his mychart isnt working correctly and that he needs his return to work releasde date of 10/4 printed as well as his most recent covid test results. Patient also requesting follow up call in regards to [facility] paperwork that was faxed was received.Please advise

## 2020-02-28 NOTE — Telephone Encounter (Signed)
FMLA paperwork completed.

## 2020-02-28 NOTE — Telephone Encounter (Unsigned)
Copied from [place] [id number]. Topic: General - Other >> Feb 28, 2020  9:39 AM Rainey Pines A wrote: Patient stated that his mychart isnt working correctly and that he needs his return to work releasde date of 10/4 printed as well as his most recent covid test results. Patient also requesting follow up call in regards to [facility] paperwork that was faxed was received.Please advise

## 2020-02-28 NOTE — Telephone Encounter (Signed)
FMLA paperwork left in your box.

## 2020-02-28 NOTE — Telephone Encounter (Signed)
Paper faxed and the patient came by and pick up a copy.

## 2020-03-04 ENCOUNTER — Ambulatory Visit: Payer: Commercial Managed Care - PPO | Admitting: Gastroenterology

## 2020-03-11 NOTE — Telephone Encounter (Signed)
PT f/up on his FMLA paperwork, his missing the begging date on it 01/30/20/ requesting a new form / please advise

## 2020-03-12 NOTE — Telephone Encounter (Addendum)
Pt is calling and needs FMLA paperwork resent to [employer]. Pt need info sent to [employer] asap. Pt does not want to lose his job. Pt would like a return call

## 2020-03-12 NOTE — Telephone Encounter (Signed)
Spoke with patient, needing line #5 updated to be out continuously from 01/23/2020-03/02/2020.  Updated, refaxed to employer.

## 2020-03-23 ENCOUNTER — Other Ambulatory Visit: Payer: Self-pay | Admitting: Family Medicine

## 2020-03-23 DIAGNOSIS — K449 Diaphragmatic hernia without obstruction or gangrene: Secondary | ICD-10-CM

## 2020-03-23 DIAGNOSIS — K219 Gastro-esophageal reflux disease without esophagitis: Secondary | ICD-10-CM

## 2020-03-23 DIAGNOSIS — I10 Essential (primary) hypertension: Secondary | ICD-10-CM

## 2020-03-24 NOTE — Telephone Encounter (Signed)
Requested Prescriptions  Pending Prescriptions Disp Refills  . amLODipine-benazepril (LOTREL) 10-40 MG capsule [Pharmacy Med Name: AMLODIPINE-BENAZEPRIL 10-40 MG] 90 capsule 0    Sig: TAKE 1 CAPSULE BY MOUTH EVERY DAY     Cardiovascular: CCB + ACEI Combos Failed - 03/23/2020  7:07 PM      Failed - K in normal range and within 180 days    Potassium  Date Value Ref Range Status  02/17/2020 5.5 (H) 3.5 - 5.1 mmol/L Final         Passed - Cr in normal range and within 180 days    Creat  Date Value Ref Range Status  09/30/2019 0.61 (L) 0.70 - 1.33 mg/dL Final    Comment:    For patients >74 years of age, the reference limit for Creatinine is approximately 13% higher for people identified as African-American. .    Creatinine, Ser  Date Value Ref Range Status  02/17/2020 0.72 0.61 - 1.24 mg/dL Final         Passed - Patient is not pregnant      Passed - Last BP in normal range    BP Readings from Last 1 Encounters:  02/17/20 122/85         Passed - Valid encounter within last 6 months    Recent Outpatient Visits          1 month ago [facility] [place], [name], FNP   1 month ago Acute non-recurrent frontal sinusitis   [facility], [name], DO   1 month ago Suspected COVID-19 virus infection   [place], DO   5 months ago Non-intractable vomiting with nausea, unspecified vomiting type   [place], DO   5 months ago Nausea   [facility], [name], DO             . omeprazole (PRILOSEC) 40 MG capsule [Pharmacy Med Name: OMEPRAZOLE DR 40 MG CAPSULE] 180 capsule 0    Sig: TAKE 1 CAPSULE BY MOUTH TWICE A DAY     Gastroenterology: Proton Pump Inhibitors Passed - 03/23/2020  7:07 PM      Passed - Valid encounter within last 12 months    Recent Outpatient Visits          1 month ago [facility] [place], [name], FNP   1 month ago Acute non-recurrent frontal sinusitis   [facility] [place], [name], DO   1 month ago Suspected COVID-19 virus infection   [place], DO   5 months ago Non-intractable vomiting with nausea, unspecified vomiting type   [place], DO   5 months ago Nausea   [place], [name], [place]

## 2020-04-11 ENCOUNTER — Other Ambulatory Visit: Payer: Self-pay | Admitting: Family Medicine

## 2020-04-11 DIAGNOSIS — R059 Cough, unspecified: Secondary | ICD-10-CM

## 2020-04-11 DIAGNOSIS — U071 COVID-19: Secondary | ICD-10-CM

## 2020-04-11 NOTE — Telephone Encounter (Signed)
Requested Prescriptions  Pending Prescriptions Disp Refills  . albuterol (VENTOLIN HFA) 108 (90 Base) MCG/ACT inhaler [Pharmacy Med Name: ALBUTEROL HFA (PROVENTIL) INH] 6.7 each 1    Sig: INHALE 1-2 PUFFS INTO THE LUNGS EVERY 4 (FOUR) HOURS AS NEEDED FOR WHEEZING OR SHORTNESS OF BREATH.     Pulmonology:  Beta Agonists Failed - 04/11/2020  9:05 AM      Failed - One inhaler should last at least one month. If the patient is requesting refills earlier, contact the patient to check for uncontrolled symptoms.      Passed - Valid encounter within last 12 months    Recent Outpatient Visits          1 month ago [facility] [place], [name], FNP   2 months ago Acute non-recurrent frontal sinusitis   [facility] [place], [name], DO   2 months ago Suspected COVID-19 virus infection   [place], DO   6 months ago Non-intractable vomiting with nausea, unspecified vomiting type   [place], DO   6 months ago Nausea   [place], [name], [place]

## 2020-06-16 ENCOUNTER — Other Ambulatory Visit: Payer: Self-pay | Admitting: Family Medicine

## 2020-06-16 DIAGNOSIS — K449 Diaphragmatic hernia without obstruction or gangrene: Secondary | ICD-10-CM

## 2020-06-16 DIAGNOSIS — K219 Gastro-esophageal reflux disease without esophagitis: Secondary | ICD-10-CM

## 2020-06-16 DIAGNOSIS — I10 Essential (primary) hypertension: Secondary | ICD-10-CM

## 2020-06-18 ENCOUNTER — Other Ambulatory Visit: Payer: Self-pay

## 2020-06-18 ENCOUNTER — Telehealth (INDEPENDENT_AMBULATORY_CARE_PROVIDER_SITE_OTHER): Payer: Commercial Managed Care - PPO | Admitting: Family Medicine

## 2020-06-18 ENCOUNTER — Encounter: Payer: Self-pay | Admitting: Family Medicine

## 2020-06-18 VITALS — Ht 66.0 in | Wt 150.0 lb

## 2020-06-18 DIAGNOSIS — R52 Pain, unspecified: Secondary | ICD-10-CM | POA: Diagnosis not present

## 2020-06-18 DIAGNOSIS — R110 Nausea: Secondary | ICD-10-CM

## 2020-06-18 DIAGNOSIS — R6889 Other general symptoms and signs: Secondary | ICD-10-CM

## 2020-06-18 DIAGNOSIS — Z20822 Contact with and (suspected) exposure to covid-19: Secondary | ICD-10-CM | POA: Diagnosis not present

## 2020-06-18 MED ORDER — ONDANSETRON 4 MG PO TBDP: 4.0000 mg | ORAL_TABLET | Freq: Three times a day (TID) | ORAL | 0 refills | Status: DC | PRN

## 2020-06-18 NOTE — Progress Notes (Signed)
Virtual Visit via Telephone The purpose of this virtual visit is to provide medical care while limiting exposure to the novel coronavirus (COVID19) for both patient and office staff.  Consent was obtained for phone visit:  Yes.   Answered questions that patient had about telehealth interaction:  Yes.   I discussed the limitations, risks, security and privacy concerns of performing an evaluation and management service by telephone. I also discussed with the patient that there may be a patient responsible charge related to this service. The patient expressed understanding and agreed to proceed.  Patient Location: Home Provider Location: [facility] (Office)  Participants in virtual visit: - Patient: Luis Porter - CMA: Orinda Kenner, CMA - Provider: Dr Parks Ranger  ---------------------------------------------------------------------- Chief Complaint  Patient presents with  . Emesis  . Generalized Body Aches  . Dizziness  . Fatigue    S: Reviewed CMA documentation. I have called patient and gathered additional HPI as follows:  Suspected COVID Flu-like symptoms Nausea vomiting Body aches Reports that symptoms started last week Friday 06/12/20 with nausea vomiting, body aches, fatigued, cough. He has not recorded a fever. No coughing or respiratory symptoms at this time. No known covid exposure - He had a COVID test negative on 06/16/20 through [facility] - He received 2nd dose pfizer covid vaccine later that same day after symptoms already started OTC medications with some relief  Patient is currently in isolation out of work Denies any high risk travel to areas of current concern for [place]. Denies any known or suspected exposure to person with or possibly with COVID19.  Denies any fevers, chills, sweats, headache, abdominal pain, diarrhea  Past Medical History:  Diagnosis Date  . Diverticulosis   . Gastric ulcer   . GERD (gastroesophageal reflux disease)    . History of hiatal hernia   . Hypertension   . Iron deficiency anemia 10/18/2019  . 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 Smoking status: Former Smoker    Packs/day: 2.00    Years: 25.00    Pack years: 50.00    Types: Cigarettes    Quit date: 05/31/1987    Years since quitting: 33.0  . Smokeless tobacco: Never Used  Vaping Use  . Vaping Use: Never used  Substance Use Topics  . Alcohol use: Yes    Comment: 8-10 beers per day  . Drug use: No    Current Outpatient Medications:  .  albuterol (VENTOLIN HFA) 108 (90 Base) MCG/ACT inhaler, INHALE 1-2 PUFFS INTO THE LUNGS EVERY 4 (FOUR) HOURS AS NEEDED FOR WHEEZING OR SHORTNESS OF BREATH., Disp: 6.7 each, Rfl: 1 .  amLODipine-benazepril (LOTREL) 10-40 MG capsule, TAKE 1 CAPSULE BY MOUTH EVERY DAY, Disp: 90 capsule, Rfl: 0 .  fluticasone (FLONASE) 50 MCG/ACT nasal spray, Place 2 sprays into both nostrils Porter. Use for 4-6 weeks then stop and use seasonally or as needed., Disp: 16 g, Rfl: 3 .  omeprazole (PRILOSEC) 40 MG capsule, TAKE 1 CAPSULE BY MOUTH TWICE A DAY, Disp: 180 capsule, Rfl: 1 .  ondansetron (ZOFRAN ODT) 4 MG disintegrating tablet, Take 1 tablet (4 mg total) by mouth every 8 (eight) hours as needed for nausea or vomiting., Disp: 30 tablet, Rfl: 0 .  Vitamins/Minerals TABS, Take by mouth., Disp: , Rfl:  .  dicyclomine (BENTYL) 10 MG capsule, Take 1 capsule (10 mg total) by mouth 4 (four) times Porter -  before  meals and at bedtime. (Patient not taking: Reported on 06/18/2020), Disp: 40 capsule, Rfl: 1  Depression screen [facility] 2/9 09/30/2019 11/20/2018 11/08/2016  Decreased Interest 0 0 0  Down, Depressed, Hopeless 0 0 0  PHQ - 2 Score 0 0 0  Altered sleeping - - 0  Tired, decreased energy - - 1  Change in appetite - - 0  Feeling bad or failure about yourself  - - 0  Trouble concentrating - - 0  Moving slowly or fidgety/restless - - 0  Suicidal  thoughts - - 0  PHQ-9 Score - - 1    No flowsheet data found.  -------------------------------------------------------------------------- O: No physical exam performed due to remote telephone encounter.  Lab results reviewed.  No results found for this or any previous visit (from the past 2160 hour(s)).  -------------------------------------------------------------------------- A&P:  Problem List Items Addressed This Visit   None   Visit Diagnoses    Suspected COVID-19 virus infection    -  Primary   Flu-like symptoms       Generalized body aches       Nausea       Relevant Medications   ondansetron (ZOFRAN ODT) 4 MG disintegrating tablet     Suspected COVID vs Flu-like viral syndrome Reassuring no respiratory symptoms at this time, afebrile Duration >6 days now, defer flu testing or flu medication given duration of symptoms 1st symptoms 06/12/20 2nd dose vaccine 06/12/20 - some side effects may be experiencing now but still has symptoms so less likely only side effect Prior history of covid  COVID Swab [facility] - patient will self collect drive up to office today  Rx Zofran ODT PRN for nausea Continue OTC medications for anti inflammatory flu like symptoms  Will likely need documentation work note / FMLA for this episode, will discuss pending covid result and symptoms   Meds ordered this encounter  Medications  . ondansetron (ZOFRAN ODT) 4 MG disintegrating tablet    Sig: Take 1 tablet (4 mg total) by mouth every 8 (eight) hours as needed for nausea or vomiting.    Dispense:  30 tablet    Refill:  0    Follow-up: - Return as needed  Patient verbalizes understanding with the above medical recommendations including the limitation of remote medical advice.  Specific follow-up and call-back criteria were given for patient to follow-up or seek medical care more urgently if needed.   - Time spent in direct consultation with patient on phone: 9 minutes   Nobie Putnam, [facility] 06/18/2020, 11:05 AM

## 2020-06-20 LAB — NOVEL CORONAVIRUS, NAA: SARS-CoV-2, NAA: NOT DETECTED

## 2020-06-20 LAB — SARS-COV-2, NAA 2 DAY TAT

## 2020-06-22 ENCOUNTER — Telehealth: Payer: Self-pay

## 2020-06-22 NOTE — Telephone Encounter (Signed)
Work note written, on [employer] sent to patient. Please notify him as he requested call back.  Thanks  Nobie Putnam, [facility] 06/22/2020, 4:29 PM

## 2020-06-22 NOTE — Telephone Encounter (Signed)
Patient has been notified

## 2020-06-22 NOTE — Telephone Encounter (Signed)
Copied from [facility] [phone]. Topic: General - Other >> Jun 22, 2020  9:12 AM Yvette Rack wrote: Reason for CRM: Pt stated he would like to return to work on 06/24/20 and he will need a doctor's note. Pt requests that the doctor's note gives him permission to return to work on 06/24/20. Pt also requests call back once note is ready.

## 2020-06-24 ENCOUNTER — Encounter: Payer: Self-pay | Admitting: Family Medicine

## 2020-06-24 ENCOUNTER — Telehealth (INDEPENDENT_AMBULATORY_CARE_PROVIDER_SITE_OTHER): Payer: Commercial Managed Care - PPO | Admitting: Family Medicine

## 2020-06-24 ENCOUNTER — Other Ambulatory Visit: Payer: Self-pay

## 2020-06-24 VITALS — Ht 66.0 in | Wt 150.0 lb

## 2020-06-24 DIAGNOSIS — R42 Dizziness and giddiness: Secondary | ICD-10-CM | POA: Diagnosis not present

## 2020-06-24 DIAGNOSIS — R112 Nausea with vomiting, unspecified: Secondary | ICD-10-CM | POA: Diagnosis not present

## 2020-06-24 MED ORDER — MECLIZINE HCL 25 MG PO TABS: 25.0000 mg | ORAL_TABLET | Freq: Three times a day (TID) | ORAL | 1 refills | Status: DC | PRN

## 2020-06-24 NOTE — Progress Notes (Signed)
Virtual Visit via Telephone The purpose of this virtual visit is to provide medical care while limiting exposure to the novel coronavirus (COVID19) for both patient and office staff.  Consent was obtained for phone visit:  Yes.   Answered questions that patient had about telehealth interaction:  Yes.   I discussed the limitations, risks, security and privacy concerns of performing an evaluation and management service by telephone. I also discussed with the patient that there may be a patient responsible charge related to this service. The patient expressed understanding and agreed to proceed.  Patient Location: Home Provider Location: [facility] (Office)  Participants in virtual visit: - Patient: Luis Porter - CMA: Orinda Kenner, CMA - Provider: Dr Parks Ranger  ---------------------------------------------------------------------- Chief Complaint  Patient presents with  . Emesis    S: Reviewed CMA documentation. 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 Last virtual 06/18/20. Given zofran PRN some relief still feels weak and symptoms when stands up and gets up in AM with spinning or dizziness causing some nausea  Previously followed by [facility] last 08/6268 Dx Alcoholic Gastritis . Now only drinks small amount not every day Taking Omeprazole 40 BID Has taken Dramamine in past.  Patient is currently not able to work. Was out since 06/12/20 due to covid testing.  Denies any known or suspected exposure to person with or possibly with COVID19.  Denies any fevers, chills, sweats, body ache, cough, shortness of breath, sinus pain or pressure, headache, abdominal pain, diarrhea  Past Medical History:  Diagnosis Date  .  Diverticulosis   . Gastric ulcer   . GERD (gastroesophageal reflux disease)   . History of hiatal hernia   . Hypertension   . Iron deficiency anemia 10/18/2019  . 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 Smoking status: Former Smoker    Packs/day: 2.00    Years: 25.00    Pack years: 50.00    Types: Cigarettes    Quit date: 05/31/1987    Years since quitting: 33.0  . Smokeless tobacco: Never Used  Vaping Use  . Vaping Use: Never used  Substance Use Topics  . Alcohol use: Yes    Comment: 8-10 beers per day  . Drug use: No    Current Outpatient Medications:  .  albuterol (VENTOLIN HFA) 108 (90 Base) MCG/ACT inhaler, INHALE 1-2 PUFFS INTO THE LUNGS EVERY 4 (FOUR) HOURS AS NEEDED FOR WHEEZING OR SHORTNESS OF BREATH., Disp: 6.7 each, Rfl: 1 .  amLODipine-benazepril (LOTREL) 10-40 MG capsule, TAKE 1 CAPSULE BY MOUTH EVERY DAY, Disp: 90 capsule, Rfl: 0 .  fluticasone (FLONASE) 50 MCG/ACT nasal spray, Place 2 sprays into both nostrils daily. Use for 4-6 weeks then stop and use seasonally or as needed., Disp: 16 g, Rfl: 3 .  omeprazole (PRILOSEC) 40 MG capsule, TAKE 1 CAPSULE BY MOUTH TWICE A DAY, Disp: 180 capsule, Rfl: 1 .  ondansetron (ZOFRAN ODT) 4 MG disintegrating tablet, Take 1 tablet (4 mg total) by mouth every 8 (eight) hours as needed for nausea or vomiting., Disp: 30 tablet, Rfl: 0 .  Vitamins/Minerals TABS, Take by mouth., Disp: , Rfl:   Depression screen [facility] 2/9 09/30/2019 11/20/2018  11/08/2016  Decreased Interest 0 0 0  Down, Depressed, Hopeless 0 0 0  PHQ - 2 Score 0 0 0  Altered sleeping - - 0  Tired, decreased energy - - 1  Change in appetite - - 0  Feeling bad or failure about yourself  - - 0  Trouble concentrating - - 0  Moving slowly or fidgety/restless - - 0  Suicidal thoughts - - 0  PHQ-9 Score - - 1    No flowsheet data  found.  -------------------------------------------------------------------------- O: No physical exam performed due to remote telephone encounter.  Lab results reviewed.  Recent Results (from the past 2160 hour(s))  Novel Coronavirus, NAA ([facility])     Status: None   Collection Time: 06/18/20 12:00 AM   Specimen: Nasopharyngeal(NP) swabs in vial transport medium   Nasopharynge  Result Value Ref Range   SARS-CoV-2, NAA Not Detected Not Detected    Comment: This nucleic acid amplification test was developed and its performance characteristics determined by [employer]. Nucleic acid amplification tests include RT-PCR and TMA. This test has not been FDA cleared or approved. This test has been authorized by FDA under an Emergency Use Authorization (EUA). This test is only authorized for the duration of time the declaration that circumstances exist justifying the authorization of the emergency use of in vitro diagnostic tests for detection of SARS-CoV-2 virus and/or diagnosis of COVID-19 infection under section 564(b)(1) of the Act, 21 U.S.C. 093GHW-2(X) (1), unless the authorization is terminated or revoked sooner. When diagnostic testing is negative, the possibility of a false negative result should be considered in the context of a patient's recent exposures and the presence of clinical signs and symptoms consistent with COVID-19. An individual without symptoms of COVID-19 and who is not shedding SARS-CoV-2 virus wo uld expect to have a negative (not detected) result in this assay.   SARS-COV-2, NAA 2 DAY TAT     Status: None   Collection Time: 06/18/20 12:00 AM   Nasopharynge  Result Value Ref Range   SARS-CoV-2, NAA 2 DAY TAT Performed     -------------------------------------------------------------------------- A&P:  Problem List Items Addressed This Visit   None   Visit Diagnoses    Vertigo    -  Primary   Non-intractable vomiting with nausea, unspecified  vomiting type         Suspected persistent residual viral syndrome or secondary causes now with vertigo / dizziness / generalized weakness, nausea vomiting  Plan: 1. AVS Handout given with Epley maneuver TID for 1-2 weeks until resolved 2. Rx meclizine PRN  - Use Zofran ODT - May return to GI given his chronic GI symptoms seems to still provoke his current course   Return criteria reviewed  May need Urgent Care for prompt testing if acute GI symptoms or weakness worsening.  Work note extended through 07/08/20   No orders of the defined types were placed in this encounter.   Follow-up: - Return as needed within 2-4 weeks, he should see GI specialist as well  Patient verbalizes understanding with the above medical recommendations including the limitation of remote medical advice.  Specific follow-up and call-back criteria were given for patient to follow-up or seek medical care more urgently if needed.   - Time spent in direct consultation with patient on phone: 7 minutes   Nobie Putnam, [facility] 06/24/2020, 2:59 PM

## 2020-06-24 NOTE — Patient Instructions (Signed)
1. You have symptoms of Vertigo (Benign Paroxysmal Positional Vertigo) - This is commonly caused by inner ear fluid imbalance, sometimes can be worsened by allergies and sinus symptoms, otherwise it can occur randomly sometimes and we may never discover the exact cause. - To treat this, try the Epley Manuever (see diagrams/instructions below) at home up to 3 times a day for 1-2 weeks or until symptoms resolve - You may take Meclizine as needed up to 3 times a day for dizziness, this will not cure symptoms but may help. Caution may make you drowsy.  If you develop significant worsening episode with vertigo that does not improve and you get severe headache, loss of vision, arm or leg weakness, slurred speech, or other concerning symptoms please seek immediate medical attention at Emergency Department.  Please schedule a follow-up appointment with Dr Parks Ranger within 4 weeks if Vertigo not improving, and will consider Referral to Vestibular Rehab  See the next page for images describing the Epley Manuever.     ----------------------------------------------------------------------------------------------------------------------          Please schedule a Follow-up Appointment to: No follow-ups on file.  If you have any other questions or concerns, please feel free to call the office or send a message through [place]. You may also schedule an earlier appointment if necessary.  Additionally, you may be receiving a survey about your experience at our office within a few days to 1 week by e-mail or mail. We value your feedback.  Luis Putnam, DO [place]

## 2020-07-14 ENCOUNTER — Other Ambulatory Visit: Payer: Self-pay

## 2020-07-14 ENCOUNTER — Telehealth (INDEPENDENT_AMBULATORY_CARE_PROVIDER_SITE_OTHER): Payer: Commercial Managed Care - PPO | Admitting: Family Medicine

## 2020-07-14 ENCOUNTER — Encounter: Payer: Self-pay | Admitting: Family Medicine

## 2020-07-14 DIAGNOSIS — Z91199 Patient's noncompliance with other medical treatment and regimen due to unspecified reason: Secondary | ICD-10-CM

## 2020-07-14 DIAGNOSIS — Z5329 Procedure and treatment not carried out because of patient's decision for other reasons: Secondary | ICD-10-CM

## 2020-07-14 NOTE — Progress Notes (Signed)
Attempted to call multiple times today did not reach, only voicemail  No show.  Nobie Putnam, [facility] 07/14/2020, 2:34 PM

## 2020-07-20 ENCOUNTER — Other Ambulatory Visit: Payer: Self-pay | Admitting: Family Medicine

## 2020-07-20 DIAGNOSIS — I10 Essential (primary) hypertension: Secondary | ICD-10-CM

## 2020-07-20 DIAGNOSIS — R110 Nausea: Secondary | ICD-10-CM

## 2020-07-20 DIAGNOSIS — J0110 Acute frontal sinusitis, unspecified: Secondary | ICD-10-CM

## 2020-07-20 NOTE — Telephone Encounter (Signed)
Requested medication (s) are due for refill today: lotrel - too soon, zofran- yes , phenergan- no d/c'd on 01/23/20  Requested medication (s) are on the active medication list: yes except phenergan  Last refill:  06/18/20 #30 0 refills   Future visit scheduled: yes in 2 days   Notes to clinic:  not delegated per protocol- zofran      Requested Prescriptions  Pending Prescriptions Disp Refills   amLODipine-benazepril (LOTREL) 10-40 MG capsule [Pharmacy Med Name: AMLODIPINE-BENAZEPRIL 10-40 MG] 90 capsule 0    Sig: TAKE 1 CAPSULE BY MOUTH EVERY DAY      Cardiovascular: CCB + ACEI Combos Failed - 07/20/2020  4:32 PM      Failed - K in normal range and within 180 days    Potassium  Date Value Ref Range Status  02/17/2020 5.5 (H) 3.5 - 5.1 mmol/L Final          Passed - Cr in normal range and within 180 days    Creat  Date Value Ref Range Status  09/30/2019 0.61 (L) 0.70 - 1.33 mg/dL Final    Comment:    For patients >25 years of age, the reference limit for Creatinine is approximately 13% higher for people identified as African-American. .    Creatinine, Ser  Date Value Ref Range Status  02/17/2020 0.72 0.61 - 1.24 mg/dL Final          Passed - Patient is not pregnant      Passed - Last BP in normal range    BP Readings from Last 1 Encounters:  02/17/20 122/85          Passed - Valid encounter within last 6 months    Recent Outpatient Visits           6 days ago No-show for appointment   [facility] Olin Hauser, DO   3 weeks ago [facility] Olin Hauser, DO   1 month ago Suspected COVID-19 virus infection   [place], DO   5 months ago [facility] [place], [name], FNP   5 months ago Acute non-recurrent frontal sinusitis   [facility] Parks Ranger, [name], DO       Future Appointments             In  2 days Parks Ranger, [name], [facility]               ondansetron (ZOFRAN-ODT) 4 MG disintegrating tablet [Pharmacy Med Name: ONDANSETRON ODT 4 MG TABLET] 30 tablet 0    Sig: TAKE 1 TABLET BY MOUTH EVERY 8 HOURS AS NEEDED FOR NAUSEA AND VOMITING      Not Delegated - Gastroenterology: Antiemetics Failed - 07/20/2020  4:32 PM      Failed - This refill cannot be delegated      Passed - Valid encounter within last 6 months    Recent Outpatient Visits           6 days ago No-show for appointment   [place], DO   3 weeks ago [place], DO   1 month ago Suspected COVID-19 virus infection   [place], DO   5 months ago [facility] [place], [name], [place]  5 months ago Acute non-recurrent frontal sinusitis   [place], DO       Future Appointments             In 2 days [place], DO [facility], [facility]               promethazine (PHENERGAN) 25 MG tablet [Pharmacy Med Name: PROMETHAZINE 25 MG TABLET] 30 tablet 0    Sig: TAKE 1/2 TO 1 TABLET BY MOUTH EVERY 8 (EIGHT) HOURS AS NEEDED FOR NAUSEA OR VOMITING.      Not Delegated - Gastroenterology: Antiemetics Failed - 07/20/2020  4:32 PM      Failed - This refill cannot be delegated      Passed - Valid encounter within last 6 months    Recent Outpatient Visits           6 days ago No-show for appointment   [facility] Olin Hauser, DO   3 weeks ago [facility] [place], [name], DO   1 month ago Suspected COVID-19 virus infection   [place], DO   5 months ago [facility] [place], [name], FNP   5 months ago Acute non-recurrent  frontal sinusitis   [place], DO       Future Appointments             In 2 days Parks Ranger, [name], DO [facility], [facility]              Signed Prescriptions Disp Refills   fluticasone (FLONASE) 50 MCG/ACT nasal spray 48 mL 1    Sig: PLACE 2 SPRAYS INTO BOTH NOSTRILS DAILY. USE FOR 4-6 WEEKS THEN STOP AND USE SEASONALLY OR AS NEEDED.      Ear, Nose, and Throat: Nasal Preparations - Corticosteroids Passed - 07/20/2020  4:32 PM      Passed - Valid encounter within last 12 months    Recent Outpatient Visits           6 days ago No-show for appointment   [place], DO   3 weeks ago [facility] Olin Hauser, DO   1 month ago Suspected COVID-19 virus infection   [place], DO   5 months ago [facility] [place], [name], FNP   5 months ago Acute non-recurrent frontal sinusitis   [facility] Parks Ranger, [name], DO       Future Appointments             In 2 days Parks Ranger, [name], [facility], [facility]

## 2020-07-20 NOTE — Telephone Encounter (Signed)
Future in 2 days

## 2020-07-22 ENCOUNTER — Telehealth (INDEPENDENT_AMBULATORY_CARE_PROVIDER_SITE_OTHER): Payer: Commercial Managed Care - PPO | Admitting: Family Medicine

## 2020-07-22 ENCOUNTER — Telehealth: Payer: Self-pay

## 2020-07-22 ENCOUNTER — Other Ambulatory Visit: Payer: Self-pay

## 2020-07-22 ENCOUNTER — Ambulatory Visit: Payer: Self-pay | Admitting: *Deleted

## 2020-07-22 ENCOUNTER — Encounter: Payer: Self-pay | Admitting: Family Medicine

## 2020-07-22 VITALS — Ht 66.0 in | Wt 150.0 lb

## 2020-07-22 DIAGNOSIS — R5383 Other fatigue: Secondary | ICD-10-CM | POA: Diagnosis not present

## 2020-07-22 DIAGNOSIS — R42 Dizziness and giddiness: Secondary | ICD-10-CM | POA: Diagnosis not present

## 2020-07-22 DIAGNOSIS — R112 Nausea with vomiting, unspecified: Secondary | ICD-10-CM

## 2020-07-22 DIAGNOSIS — K909 Intestinal malabsorption, unspecified: Secondary | ICD-10-CM

## 2020-07-22 DIAGNOSIS — D649 Anemia, unspecified: Secondary | ICD-10-CM | POA: Diagnosis not present

## 2020-07-22 NOTE — Progress Notes (Unsigned)
Subjective:    Patient ID: Luis Porter, male    DOB: Oct 14, 1960, 60 y.o.   MRN: [id number]  Luis Porter is a 60 y.o. male presenting on 07/22/2020 for Diverticulitis  Virtual / Telehealth Encounter - Video Visit via MyChart The purpose of this virtual visit is to provide medical care while limiting exposure to the novel coronavirus (COVID19) for both patient and office staff.  Consent was obtained for remote visit:  Yes.   Answered questions that patient had about telehealth interaction:  Yes.   I discussed the limitations, risks, security and privacy concerns of performing an evaluation and management service by video/telephone. I also discussed with the patient that there may be a patient responsible charge related to this service. The patient expressed understanding and agreed to proceed.  Patient Location: Home Provider Location: [facility] (Office)  Participants in virtual visit: - Patient: [name] - CMA: Orinda Kenner, CMA - Provider: Dr Parks Ranger   HPI   Dizziness / nausea vomiting / Weakness Vertigo  History of GERD / PUD / Diverticulitis  Chronic recurrent viral syndromes it seems since Fall 2021, has had recent Flu like illness and negative COVID test. Last virtual 06/18/20. Given zofran PRN some relief still feels weak and symptoms when stands up and gets up in AM with spinning or dizziness causing some nausea  Previously followed by [facility] last 09/5730 Dx Alcoholic Gastritis . Now only drinks small amount not every day Taking Omeprazole 40 BID Has taken Dramamine in past.  No energy, has nausea vomiting in AM  Last seen by [facility] 12/2019, he did not follow up with them, after procedures colonoscopy  Due for labs given worsening fatigue symptoms, history of anemia. previously treated by [facility]  Not dehydrated, drinking Ensures, vitamins, water, gatorade.  History diverticulitis, but now no  dark stool or blood in stool.  Mostly 0 intake alcohol.   Depression screen [facility] 2/9 09/30/2019 11/20/2018 11/08/2016  Decreased Interest 0 0 0  Down, Depressed, Hopeless 0 0 0  PHQ - 2 Score 0 0 0  Altered sleeping - - 0  Tired, decreased energy - - 1  Change in appetite - - 0  Feeling bad or failure about yourself  - - 0  Trouble concentrating - - 0  Moving slowly or fidgety/restless - - 0  Suicidal thoughts - - 0  PHQ-9 Score - - 1    Social History   Tobacco Use  . Smoking status: Former Smoker    Packs/day: 2.00    Years: 25.00    Pack years: 50.00    Types: Cigarettes    Quit date: 05/31/1987    Years since quitting: 33.1  . Smokeless tobacco: Never Used  Vaping Use  . Vaping Use: Never used  Substance Use Topics  . Alcohol use: Yes    Comment: 8-10 beers per day  . Drug use: No    Review of Systems Per HPI unless specifically indicated above     Objective:    Ht 5\' 6"  (1.676 m)   Wt 150 lb (68 kg)   BMI 24.21 kg/m   Wt Readings from Last 3 Encounters:  07/22/20 150 lb (68 kg)  06/24/20 150 lb (68 kg)  06/18/20 150 lb (68 kg)    Physical Exam   Note examination was completely remotely via video observation objective data only  Gen - well-appearing, no acute distress or apparent pain, comfortable HEENT -  eyes appear clear without discharge or redness Heart/Lungs - cannot examine virtually - observed no evidence of coughing or labored breathing. Abd - cannot examine virtually  Skin - face visible today- no rash Neuro - awake, alert, oriented Psych - not anxious appearing   Results for orders placed or performed in visit on 06/18/20  Novel Coronavirus, NAA ([facility])   Specimen: Nasopharyngeal(NP) swabs in vial transport medium   Nasopharynge  Result Value Ref Range   SARS-CoV-2, NAA Not Detected Not Detected  SARS-COV-2, NAA 2 DAY TAT   Nasopharynge  Result Value Ref Range   SARS-CoV-2, NAA 2 DAY TAT Performed       Assessment & Plan:    Problem List Items Addressed This Visit   None   Visit Diagnoses    Non-intractable vomiting with nausea, unspecified vomiting type    -  Primary   Relevant Orders   COMPLETE METABOLIC PANEL WITH GFR   Vertigo       Normocytic anemia       Relevant Orders   CBC with Differential/Platelet   COMPLETE METABOLIC PANEL WITH GFR   Iron, TIBC and Ferritin Panel   Vitamin B12   Folate   Thyroid Panel With TSH   Fatigue, unspecified type       Relevant Orders   CBC with Differential/Platelet   COMPLETE METABOLIC PANEL WITH GFR   Thyroid Panel With TSH   Intestinal malabsorption, unspecified type       Relevant Orders   Iron, TIBC and Ferritin Panel   Vitamin B12   Folate   VITAMIN D 25 Hydroxy (Vit-D Deficiency, Fractures)   Thyroid Panel With TSH      Clinically still has constellation of GI symptoms and constitutional issue with fatigue, reduced energy, dizziness, also GI symptoms nausea vomiting diarrhea, at risk of malabsorption with history as provided, poor PO intake, seems to have had episodic issues or flare for months, with recurrent viral syndrome.  Prior colonoscopy and diagnostic from GI  He has nearly stopped alcohol intake by his report. History of gastritis in past.  Will check broad lab panel and recheck for anemia, that he had prior, on upcoming lab in 24 hours, return tomorrow..  Asked him again to call and schedule f/u with [facility] as he still may need further evaluation given constellation of symptoms impacting his function  Remains out of work, unable to perform job.  Regarding vertigo should be using meclizine and home Epley maneuver, likely secondary to constitutional issue, may refer to ENT in future if refractory  On Zofran / Phenergan PRN, do not take at same time, take intermittently PRN. Can alternate  No orders of the defined types were placed in this encounter.  Orders Placed This Encounter  Procedures  . CBC with Differential/Platelet   . COMPLETE METABOLIC PANEL WITH GFR  . Iron, TIBC and Ferritin Panel  . Vitamin B12  . Folate  . VITAMIN D 25 Hydroxy (Vit-D Deficiency, Fractures)  . Thyroid Panel With TSH      Follow up plan: No follow-ups on file.  Future labs ordered for 07/23/20, released, LAB ONLY Apt scheduled 9am   Patient verbalizes understanding with the above medical recommendations including the limitation of remote medical advice.  Specific follow-up and call-back criteria were given for patient to follow-up or seek medical care more urgently if needed.  Total duration of direct patient care provided via video conference: 15 minutes   Nobie Putnam, [facility]  [facility] 07/22/2020, 11:42 AM

## 2020-07-22 NOTE — Telephone Encounter (Signed)
Copied from [place] [id number]. Topic: General - Other >> Jul 22, 2020 12:05 PM Erick Blinks wrote: Reason for CRM: Pt forgot to mention that he is lightheaded/dizzy in addition to all other symptoms. Very fatigued/sleepy but also very dizzy. Consulted triage but he disconnected right before he was transferred. Please advise

## 2020-07-22 NOTE — Telephone Encounter (Signed)
Actually, we discussed dizziness in detail on 06/24/20.  This is not new. I was aware of this problem. We discussed a lot of symptoms today.  It will be added to the discussion today.  It does not change my treatment plan. He should have dizzy medication and he should still plan for blood work as we ordered and follow-up with GI. No change from our visit today  Nobie Putnam, [facility] 07/22/2020, 1:53 PM

## 2020-07-22 NOTE — Telephone Encounter (Signed)
Caller is calling to report he forgot to tell PCP he is having dizziness- patient disconnected call before he was transferred.

## 2020-07-23 ENCOUNTER — Emergency Department: Payer: Commercial Managed Care - PPO

## 2020-07-23 ENCOUNTER — Other Ambulatory Visit: Payer: Self-pay

## 2020-07-23 ENCOUNTER — Emergency Department
Admission: EM | Admit: 2020-07-23 | Discharge: 2020-07-23 | Disposition: A | Discharge status: Home / Self Care | Payer: Commercial Managed Care - PPO | Attending: Emergency Medicine | Admitting: Emergency Medicine

## 2020-07-23 ENCOUNTER — Ambulatory Visit: Payer: Self-pay

## 2020-07-23 DIAGNOSIS — Z87891 Personal history of nicotine dependence: Secondary | ICD-10-CM | POA: Diagnosis not present

## 2020-07-23 DIAGNOSIS — K7030 Alcoholic cirrhosis of liver without ascites: Secondary | ICD-10-CM | POA: Diagnosis not present

## 2020-07-23 DIAGNOSIS — I700 Atherosclerosis of aorta: Secondary | ICD-10-CM | POA: Diagnosis not present

## 2020-07-23 DIAGNOSIS — K2950 Unspecified chronic gastritis without bleeding: Secondary | ICD-10-CM

## 2020-07-23 DIAGNOSIS — R109 Unspecified abdominal pain: Secondary | ICD-10-CM | POA: Diagnosis present

## 2020-07-23 DIAGNOSIS — F10129 Alcohol abuse with intoxication, unspecified: Secondary | ICD-10-CM | POA: Insufficient documentation

## 2020-07-23 DIAGNOSIS — Z79899 Other long term (current) drug therapy: Secondary | ICD-10-CM | POA: Diagnosis not present

## 2020-07-23 DIAGNOSIS — F1010 Alcohol abuse, uncomplicated: Secondary | ICD-10-CM

## 2020-07-23 DIAGNOSIS — I7090 Unspecified atherosclerosis: Secondary | ICD-10-CM

## 2020-07-23 DIAGNOSIS — I10 Essential (primary) hypertension: Secondary | ICD-10-CM | POA: Diagnosis not present

## 2020-07-23 DIAGNOSIS — E860 Dehydration: Secondary | ICD-10-CM

## 2020-07-23 DIAGNOSIS — Z8616 Personal history of COVID-19: Secondary | ICD-10-CM | POA: Diagnosis not present

## 2020-07-23 LAB — BASIC METABOLIC PANEL
Anion gap: 16 — ABNORMAL HIGH (ref 5–15)
BUN: <5 mg/dL — ABNORMAL LOW (ref 6–20)
CO2: 20 mmol/L — ABNORMAL LOW (ref 22–32)
Calcium: 9 mg/dL (ref 8.9–10.3)
Chloride: 95 mmol/L — ABNORMAL LOW (ref 98–111)
Creatinine, Ser: 0.54 mg/dL — ABNORMAL LOW (ref 0.61–1.24)
GFR, Estimated: >60 mL/min (ref >60)
Glucose, Bld: 95 mg/dL (ref 70–99)
Potassium: 3.6 mmol/L (ref 3.5–5.1)
Sodium: 131 mmol/L — ABNORMAL LOW (ref 135–145)

## 2020-07-23 LAB — HEPATIC FUNCTION PANEL
ALT: 117 U/L — ABNORMAL HIGH (ref 0–44)
AST: 205 U/L — ABNORMAL HIGH (ref 15–41)
Albumin: 4.7 g/dL (ref 3.5–5.0)
Alkaline Phosphatase: 59 U/L (ref 38–126)
Bilirubin, Direct: 0.2 mg/dL (ref 0.0–0.2)
Indirect Bilirubin: 0.7 mg/dL (ref 0.3–0.9)
Total Bilirubin: 0.9 mg/dL (ref 0.3–1.2)
Total Protein: 8.3 g/dL — ABNORMAL HIGH (ref 6.5–8.1)

## 2020-07-23 LAB — LIPASE, BLOOD: Lipase: 70 U/L — ABNORMAL HIGH (ref 11–51)

## 2020-07-23 LAB — URINALYSIS, COMPLETE (UACMP) WITH MICROSCOPIC
Bacteria, UA: NONE SEEN
Bilirubin Urine: NEGATIVE
Glucose, UA: NEGATIVE mg/dL
Ketones, ur: 5 mg/dL — AB
Leukocytes,Ua: NEGATIVE
Nitrite: NEGATIVE
Protein, ur: 30 mg/dL — AB
Specific Gravity, Urine: 1.004 — ABNORMAL LOW (ref 1.005–1.030)
Squamous Epithelial / HPF: NONE SEEN (ref 0–5)
WBC, UA: NONE SEEN WBC/hpf (ref 0–5)
pH: 5 (ref 5.0–8.0)

## 2020-07-23 LAB — HEPATITIS PANEL, ACUTE
HCV Ab: NONREACTIVE
Hep A IgM: NONREACTIVE
Hep B C IgM: NONREACTIVE
Hepatitis B Surface Ag: NONREACTIVE

## 2020-07-23 LAB — CBC
HCT: 42.8 % (ref 39.0–52.0)
Hemoglobin: 15.6 g/dL (ref 13.0–17.0)
MCH: 33.8 pg (ref 26.0–34.0)
MCHC: 36.4 g/dL — ABNORMAL HIGH (ref 30.0–36.0)
MCV: 92.6 fL (ref 80.0–100.0)
Platelets: 185 10*3/uL (ref 150–400)
RBC: 4.62 MIL/uL (ref 4.22–5.81)
RDW: 14.1 % (ref 11.5–15.5)
WBC: 6.1 10*3/uL (ref 4.0–10.5)
nRBC: 0 % (ref 0.0–0.2)

## 2020-07-23 IMAGING — CT CT ABD-PELV W/ CM
2 of 5 series · 16 of 46 positions shown, 18 images · IV contrast (APPLIED)
Comparison: 09/19/2019

CLINICAL DATA: Diffuse abdominal pain, nausea and vomiting, and
dizziness for 5 weeks.

EXAM:
CT ABDOMEN AND PELVIS WITH CONTRAST
TECHNIQUE: Multidetector CT imaging of the abdomen and pelvis was performed
using the standard protocol following bolus administration of
intravenous contrast.
CONTRAST:  100mL OMNIPAQUE IOHEXOL 300 MG/ML  SOLN

[Series 2: routine abd/pel with · axial · 0.70mm/px · z∈[-1021,-591]mm · 13 of 98 slices shown, 15 images]
[im 6/98  soft-tissue]
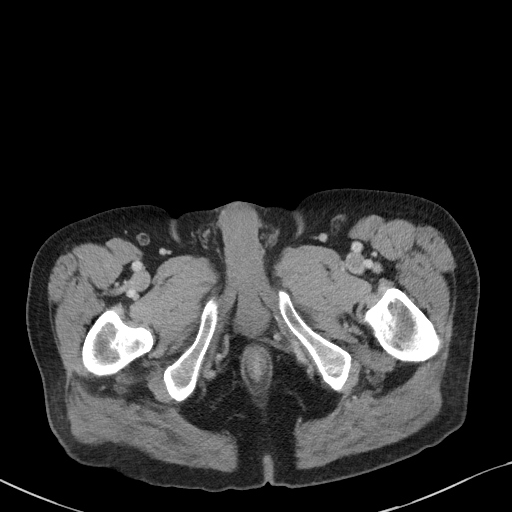
[im 6/98  bone]
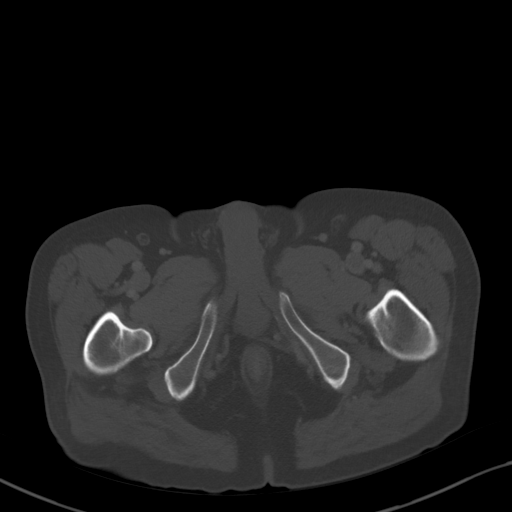
[im 11/98  soft-tissue]
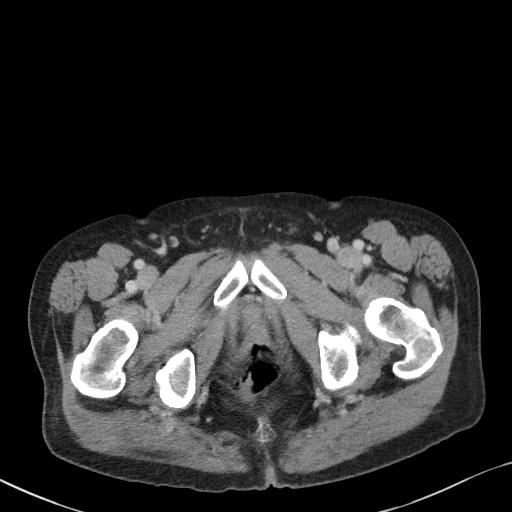
[im 22/98  soft-tissue]
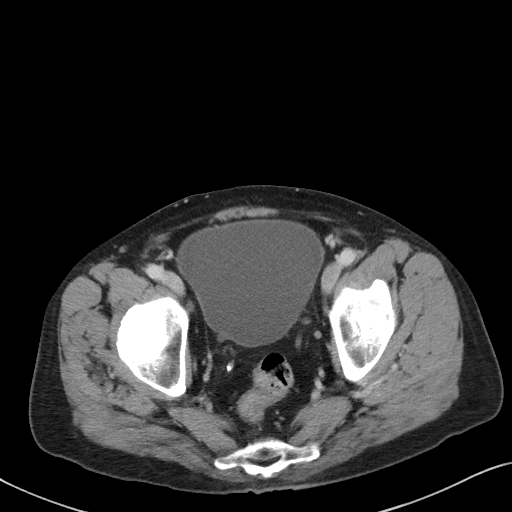
[im 27/98  soft-tissue]
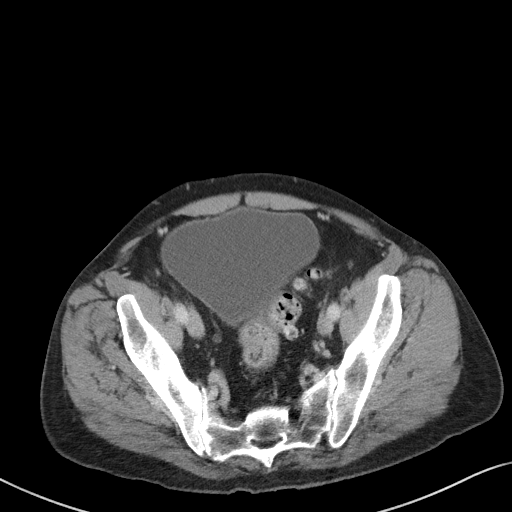
[im 33/98  soft-tissue]
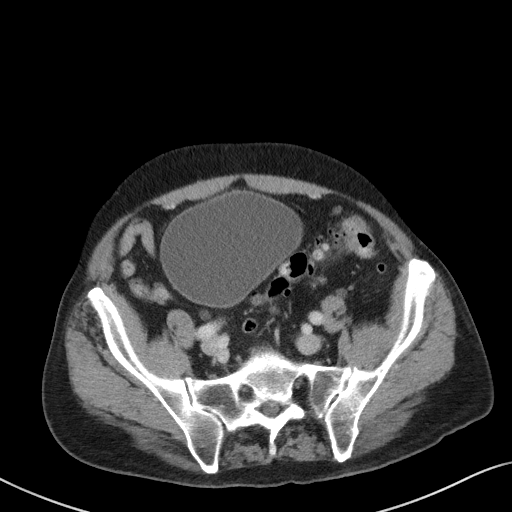
[im 44/98  soft-tissue]
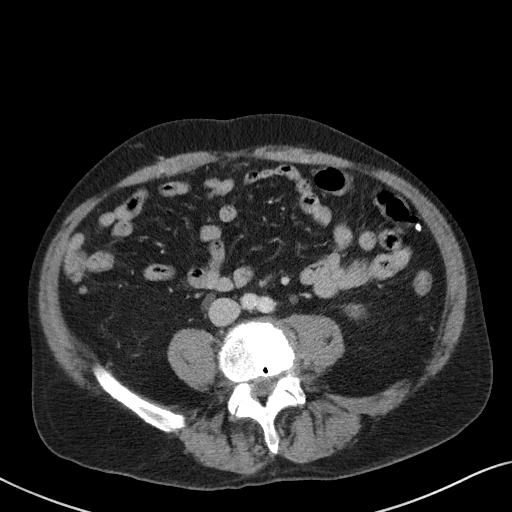
[im 49/98  soft-tissue]
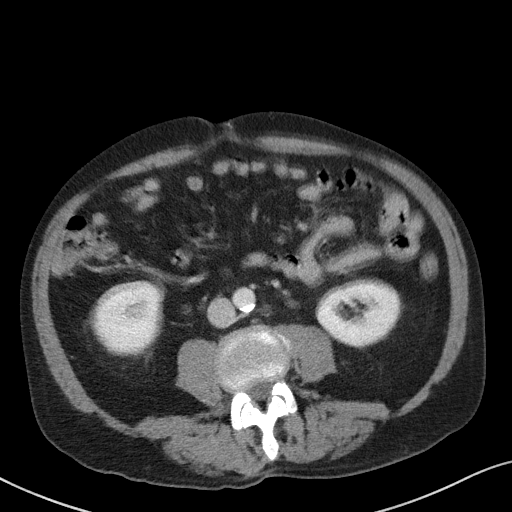
[im 54/98  soft-tissue]
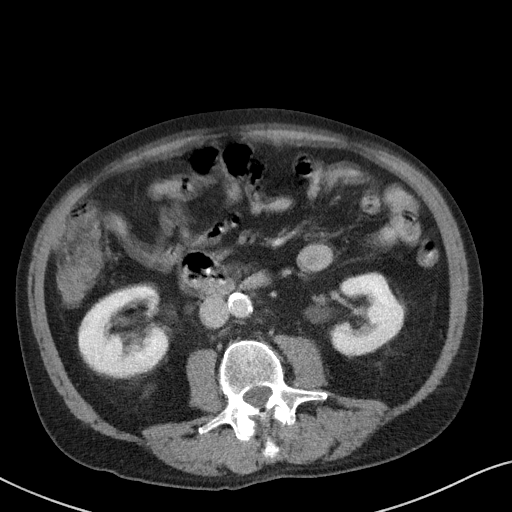
[im 65/98  soft-tissue]
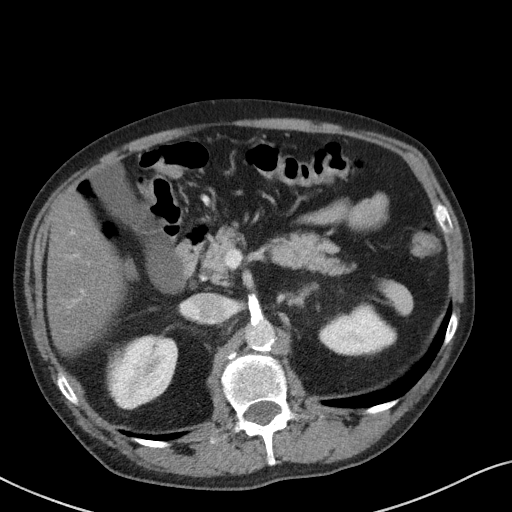
[im 65/98  bone]
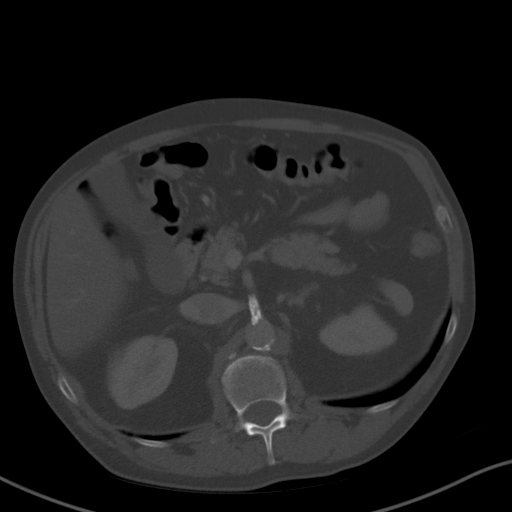
[im 71/98  soft-tissue]
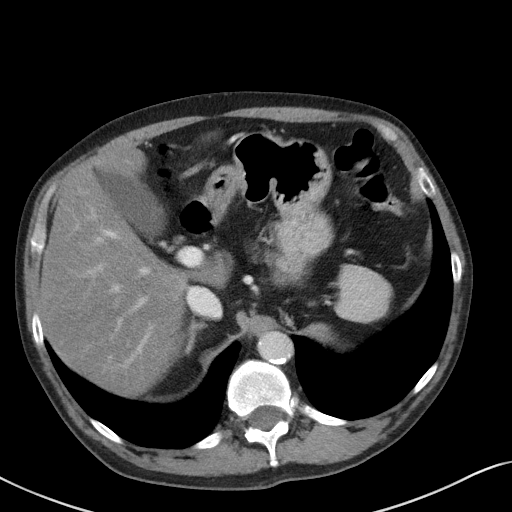
[im 76/98  soft-tissue]
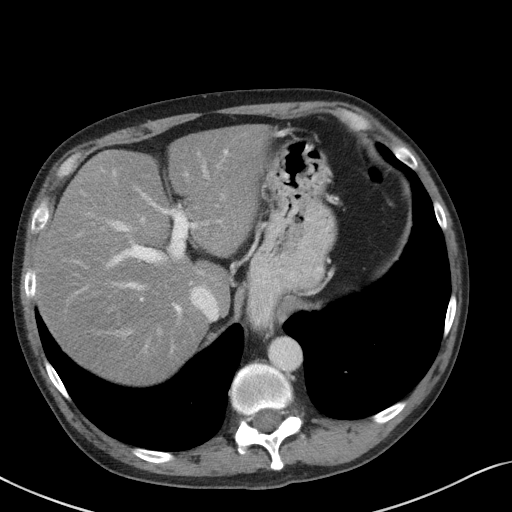
[im 87/98  soft-tissue]
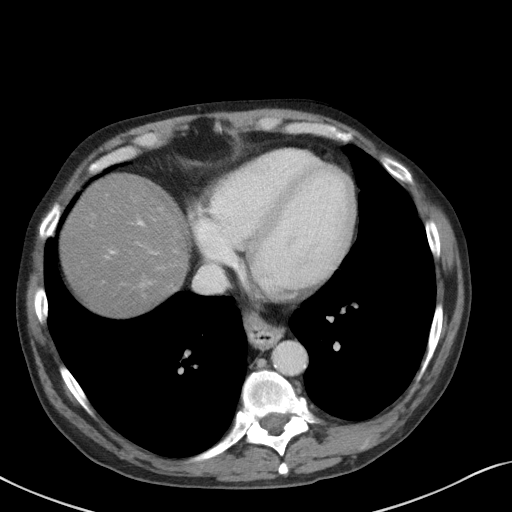
[im 92/98  soft-tissue]
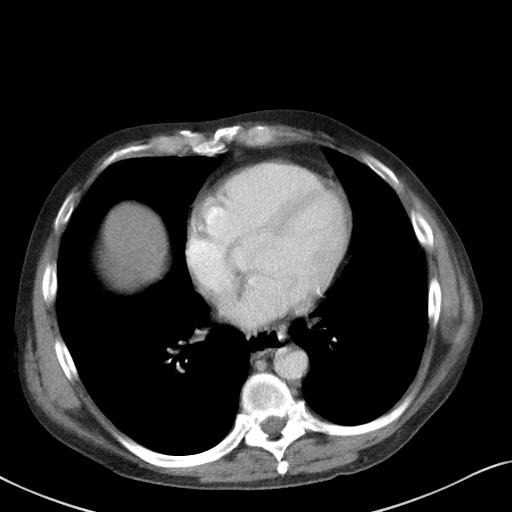

[Series 5: coronal st · coronal · 0.70mm/px · 3 of 95 slices shown]
[im 32/95  soft-tissue]
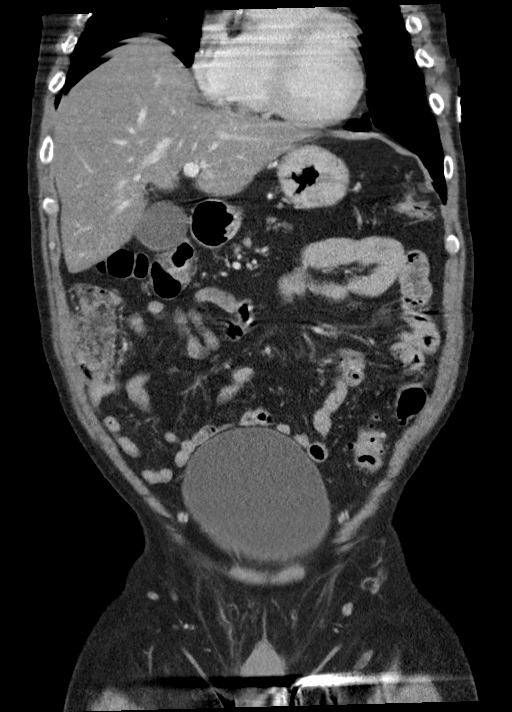
[im 42/95  soft-tissue]
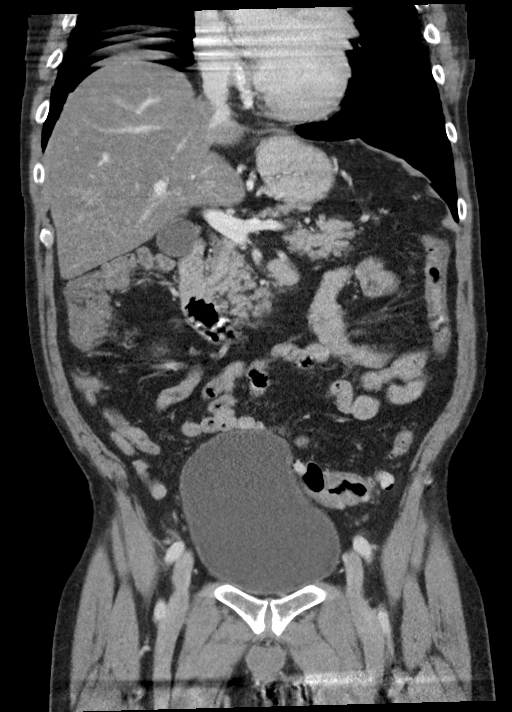
[im 53/95  soft-tissue]
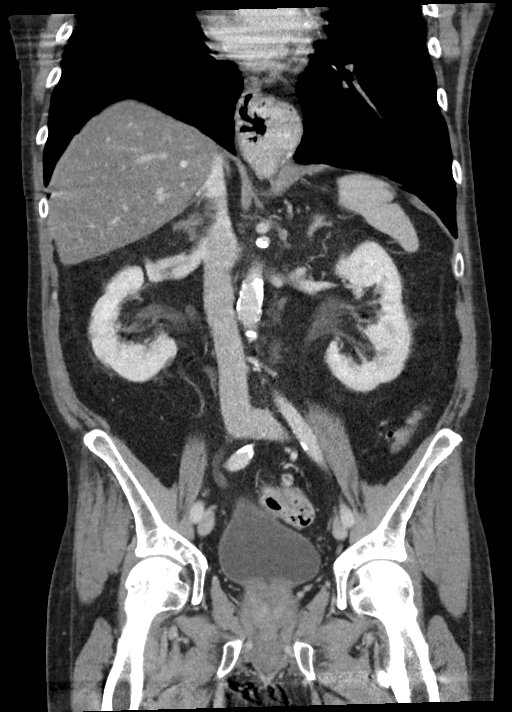

[16 of 46 positions shown; findings below may reference images not displayed]

FINDINGS: Lower Chest: No acute findings.

Hepatobiliary: Moderate to severe diffuse hepatic steatosis is
noted. No hepatic masses identified. Gallbladder is unremarkable. No
evidence of biliary ductal dilatation.

Pancreas:  No mass or inflammatory changes.

Spleen: Within normal limits in size and appearance.

Adrenals/Urinary Tract: No masses identified. Congenital duplication
of right renal collecting system again noted. No evidence of
ureteral calculi or hydronephrosis. Unremarkable unopacified urinary
bladder.

Stomach/Bowel: Moderate hiatal hernia is seen. No evidence of
obstruction, inflammatory process or abnormal fluid collections.
Normal appendix visualized. Diverticulosis is seen mainly involving
the sigmoid colon, however there is no evidence of diverticulitis.

Vascular/Lymphatic: No pathologically enlarged lymph nodes. No
abdominal aortic aneurysm. Aortic atherosclerotic calcification
noted.

Reproductive:  No mass or other significant abnormality.

Other:  None.

Musculoskeletal:  No suspicious bone lesions identified.
IMPRESSION: No acute findings within the abdomen or pelvis.

Moderate hiatal hernia.

Moderate to severe hepatic steatosis.

Colonic diverticulosis. No radiographic evidence of diverticulitis.

Aortic Atherosclerosis ([id number]-C9J.J).

## 2020-07-23 MED ORDER — IOHEXOL 300 MG/ML  SOLN
100.0000 mL | Freq: Once | INTRAMUSCULAR | Status: AC | PRN
  Administered 2020-07-23: 100 mL via INTRAVENOUS

## 2020-07-23 MED ORDER — LACTATED RINGERS IV BOLUS
1000.0000 mL | Freq: Once | INTRAVENOUS | Status: AC
  Administered 2020-07-23: 1000 mL via INTRAVENOUS

## 2020-07-23 NOTE — Telephone Encounter (Signed)
Pt. Reports he is still dizzy as soon as he gets up. "Medication helps a little and then dizziness and nausea comes back. I don't if I'm keeping enough nutrition down." "I have had this for 5 weeks." States he has weakness. Wife could not bring him in for lab work because she had to work. States he feels like he needs to be admitted to the hospital. Does not want to go to ED. Please advise pt.  Answer Assessment - Initial Assessment Questions 1. DESCRIPTION: "Describe your dizziness."     Dizzy 2. VERTIGO: "Do you feel like either you or the room is spinning or tilting?"      No 3. LIGHTHEADED: "Do you feel lightheaded?" (e.g., somewhat faint, woozy, weak upon standing)     Yes 4. SEVERITY: "How bad is it?"  "Can you walk?"   - MILD: Feels unsteady but walking normally.   - MODERATE: Feels very unsteady when walking, but not falling; interferes with normal activities (e.g., school, work) .   - SEVERE: Unable to walk without falling, or requires assistance to walk without falling.     Moderate 5. ONSET:  "When did the dizziness begin?"     5 weeks ago 6. AGGRAVATING FACTORS: "Does anything make it worse?" (e.g., standing, change in head position)     [place] 7. CAUSE: "What do you think is causing the dizziness?"     Unsure 8. RECURRENT SYMPTOM: "Have you had dizziness before?" If Yes, ask: "When was the last time?" "What happened that time?"     Yes 9. OTHER SYMPTOMS: "Do you have any other symptoms?" (e.g., headache, weakness, numbness, vomiting, earache)     Vomiting, weakness 10. PREGNANCY: "Is there any chance you are pregnant?" "When was your last menstrual period?"       n/a  Protocols used: DIZZINESS - VERTIGO-A-AH

## 2020-07-23 NOTE — ED Provider Notes (Signed)
[facility] Emergency Department Provider Note  ____________________________________________   Event Date/Time   First MD Initiated Contact with Patient 07/23/20 1401     (approximate)  I have reviewed the triage vital signs and the nursing notes.   HISTORY  Chief Complaint Weakness   HPI Luis Porter is a 60 y.o. male with a past medical history of HTN, hiatal hernia, GERD, PUD, diverticulitis and EtOH abuse previously drinking up to 12 pack of beer per day but states he is down to half a beer over the last several weeks who presents for assessment of approximately 5 weeks of almost daily nonbloody nonbilious emesis and some migrating abdominal pain as well as tremors in his upper extremities.  Patient states he has had tremor since he was a child in the face have not seem to gotten better since he stopped drinking.  He denies any headache, earache, sore throat, chest pain, cough, shortness of breath, back pain, diarrhea, urinary symptoms rash or recent falls or injuries         Past Medical History:  Diagnosis Date  . Diverticulosis   . Gastric ulcer   . GERD (gastroesophageal reflux disease)   . History of hiatal hernia   . Hypertension   . Iron deficiency anemia 10/18/2019  . Recurrent umbilical hernia with incarceration 11/08/2016   Previously repaired x 1    Patient Active Problem List   Diagnosis Date Noted  . COVID-19 02/19/2020  . Cough 02/19/2020  . Diarrhea   . Abdominal pain, epigastric   . Iron deficiency anemia 10/18/2019  . GERD (gastroesophageal reflux disease) 09/19/2019  . Acute diverticulitis 09/19/2019  . Alcohol abuse 09/19/2019  . Essential tremor 07/04/2019  . Hx of adenomatous colonic polyps 01/06/2017  . Herniated lumbar intervertebral disc 05/18/2016  . Mixed hyperlipidemia 05/06/2014  . 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 Insomnia 03/03/2014  . Diverticulosis of large intestine  without hemorrhage 03/03/2014  . Alcohol abuse, daily use 03/03/2014  . Essential hypertension 07/08/2011  . Hiatal hernia with gastroesophageal reflux 07/07/2011  . Lumbago with sciatica 07/07/2011    Past Surgical History:  Procedure Laterality Date  . BACK SURGERY    . COLONOSCOPY WITH PROPOFOL N/A 12/30/2016   Procedure: COLONOSCOPY WITH PROPOFOL;  Surgeon: [name], MD;  Location: [facility] ENDOSCOPY;  Service: Endoscopy;  Laterality: N/A;  . COLONOSCOPY WITH PROPOFOL N/A 10/31/2019   Procedure: COLONOSCOPY WITH PROPOFOL;  Surgeon: Lin Landsman, MD;  Location: [facility] ENDOSCOPY;  Service: Gastroenterology;  Laterality: N/A;  . ESOPHAGOGASTRODUODENOSCOPY (EGD) WITH PROPOFOL N/A 12/30/2016   Procedure: ESOPHAGOGASTRODUODENOSCOPY (EGD) WITH PROPOFOL;  Surgeon: [name], MD;  Location: [facility] ENDOSCOPY;  Service: Endoscopy;  Laterality: N/A;  . ESOPHAGOGASTRODUODENOSCOPY (EGD) WITH PROPOFOL N/A 10/31/2019   Procedure: ESOPHAGOGASTRODUODENOSCOPY (EGD) WITH PROPOFOL;  Surgeon: Lin Landsman, MD;  Location: [facility] ENDOSCOPY;  Service: Gastroenterology;  Laterality: N/A;  . HERNIA REPAIR  [id number]   Umbilical Hernia Repair  . LUMBAR LAMINECTOMY/DECOMPRESSION MICRODISCECTOMY Right 05/18/2016   Procedure: right L4-5 microdiscectomy;  Surgeon: Blanche East, MD;  Location: [facility] ORS;  Service: Neurosurgery;  Laterality: Right;  . TONSILLECTOMY    . UMBILICAL HERNIA REPAIR N/A 06/29/2018   Procedure: LAPAROSCOPIC REPAIR OF RECURRENT UMBILICAL HERNIA WITH MESH;  Surgeon: Vickie Epley, MD;  Location: [facility] ORS;  Service: General;  Laterality: N/A;    Prior to Admission medications   Medication Sig Start Date End Date Taking? Authorizing Provider  albuterol (VENTOLIN HFA) 108 (90  Base) MCG/ACT inhaler INHALE 1-2 PUFFS INTO THE LUNGS EVERY 4 (FOUR) HOURS AS NEEDED FOR WHEEZING OR SHORTNESS OF BREATH. 04/11/20   Karamalegos, [name], DO  amLODipine-benazepril (LOTREL) 10-40 MG capsule TAKE  1 CAPSULE BY MOUTH EVERY DAY 07/21/20   Karamalegos, [name], DO  fluticasone (FLONASE) 50 MCG/ACT nasal spray PLACE 2 SPRAYS INTO BOTH NOSTRILS DAILY. USE FOR 4-6 WEEKS THEN STOP AND USE SEASONALLY OR AS NEEDED. 07/20/20   Parks Ranger, [name], DO  meclizine (ANTIVERT) 25 MG tablet Take 1 tablet (25 mg total) by mouth 3 (three) times daily as needed for dizziness or nausea. 06/24/20   Karamalegos, [name], DO  omeprazole (PRILOSEC) 40 MG capsule TAKE 1 CAPSULE BY MOUTH TWICE A DAY 06/16/20   Karamalegos, Alexander J, DO  ondansetron (ZOFRAN-ODT) 4 MG disintegrating tablet TAKE 1 TABLET BY MOUTH EVERY 8 HOURS AS NEEDED FOR NAUSEA AND VOMITING 07/21/20   Parks Ranger, [name], DO  promethazine (PHENERGAN) 25 MG tablet TAKE 1/2 TO 1 TABLET BY MOUTH EVERY 8 (EIGHT) HOURS AS NEEDED FOR NAUSEA OR VOMITING. 07/21/20   Olin Hauser, DO  Vitamins/Minerals TABS Take by mouth.    [provider]    Allergies Nsaids  Family History  Problem Relation Age of Onset  . Hypertension Mother   . Cancer Mother        Ovarian  . Cervical cancer Mother   . Hypertension Father   . Cancer Father        tongue  . Throat cancer Father   . Hypertension Brother   . Prostate cancer Neg Hx   . Breast cancer Neg Hx   . Colon cancer Neg Hx   . Heart attack Neg Hx   . Stroke Neg Hx     Social History Social History   Tobacco Use  . Smoking status: Former Smoker    Packs/day: 2.00    Years: 25.00    Pack years: 50.00    Types: Cigarettes    Quit date: 05/31/1987    Years since quitting: 33.1  . Smokeless tobacco: Never Used  Vaping Use  . Vaping Use: Never used  Substance Use Topics  . Alcohol use: Yes    Comment: 8-10 beers per day  . Drug use: No    Review of Systems  Review of Systems  Constitutional: Positive for malaise/fatigue. Negative for chills and fever.  HENT: Negative for sore throat.   Eyes: Negative for pain.  Respiratory: Negative for cough and  stridor.   Cardiovascular: Negative for chest pain.  Gastrointestinal: Positive for abdominal pain, nausea and vomiting.  Genitourinary: Positive for dysuria.  Musculoskeletal: Negative for myalgias.  Skin: Negative for rash.  Neurological: Positive for tremors. Negative for seizures, loss of consciousness and headaches.  Psychiatric/Behavioral: Negative for suicidal ideas.  All other systems reviewed and are negative.     ____________________________________________   PHYSICAL EXAM:  VITAL SIGNS: ED Triage Vitals  Enc Vitals Group     BP 07/23/20 1252 (!) 151/98     Pulse Rate 07/23/20 1252 (!) 109     Resp 07/23/20 1252 20     Temp 07/23/20 1252 97.9 F (36.6 C)     Temp src --      SpO2 07/23/20 1252 98 %     Weight 07/23/20 1251 150 lb (68 kg)     Height 07/23/20 1251 5\' 6"  (1.676 m)     Head Circumference --      Peak Flow --  Pain Score 07/23/20 1251 0     Pain Loc --      Pain Edu? --      Excl. in [place]? --    Vitals:   07/23/20 1252  BP: (!) 151/98  Pulse: (!) 109  Resp: 20  Temp: 97.9 F (36.6 C)  SpO2: 98%   Physical Exam Vitals and nursing note reviewed.  Constitutional:      Appearance: He is well-developed and well-nourished.  HENT:     Head: Normocephalic and atraumatic.     Right Ear: External ear normal.     Left Ear: External ear normal.     Nose: Nose normal.     Mouth/Throat:     Mouth: Mucous membranes are moist.  Eyes:     Conjunctiva/sclera: Conjunctivae normal.  Cardiovascular:     Rate and Rhythm: Normal rate and regular rhythm.     Pulses: Normal pulses.     Heart sounds: No murmur heard.   Pulmonary:     Effort: Pulmonary effort is normal. No respiratory distress.     Breath sounds: Normal breath sounds.  Abdominal:     Palpations: Abdomen is soft.     Tenderness: There is no abdominal tenderness.  Musculoskeletal:        General: No edema.     Cervical back: Neck supple.     Right lower leg: No edema.     Left  lower leg: No edema.  Skin:    General: Skin is warm and dry.     Capillary Refill: Capillary refill takes less than 2 seconds.  Neurological:     Mental Status: He is alert and oriented to person, place, and time.     Motor: Tremor present.  Psychiatric:        Mood and Affect: Mood and affect and mood normal.      ____________________________________________   LABS (all labs ordered are listed, but only abnormal results are displayed)  Labs Reviewed  BASIC METABOLIC PANEL - Abnormal; Notable for the following components:      Result Value   Sodium 131 (*)    Chloride 95 (*)    CO2 20 (*)    BUN <5 (*)    Creatinine, Ser 0.54 (*)    Anion gap 16 (*)    All other components within normal limits  CBC - Abnormal; Notable for the following components:   MCHC 36.4 (*)    All other components within normal limits  URINALYSIS, COMPLETE (UACMP) WITH MICROSCOPIC - Abnormal; Notable for the following components:   Color, Urine YELLOW (*)    APPearance CLEAR (*)    Specific Gravity, Urine 1.004 (*)    Hgb urine dipstick MODERATE (*)    Ketones, ur 5 (*)    Protein, ur 30 (*)    All other components within normal limits  HEPATIC FUNCTION PANEL - Abnormal; Notable for the following components:   Total Protein 8.3 (*)    AST 205 (*)    ALT 117 (*)    All other components within normal limits  LIPASE, BLOOD - Abnormal; Notable for the following components:   Lipase 70 (*)    All other components within normal limits  HEPATITIS PANEL, ACUTE  CBG MONITORING, ED   ____________________________________________  EKG  Sinus tachycardia with ventricular rate of 109, normal axis, unremarkable intervals, incomplete right bundle branch block no other clear evidence of acute ischemia or other significant underlying arrhythmia. ____________________________________________  RADIOLOGY  ED  MD interpretation: No evidence of acute cholecystitis, pancreatitis, diverticulitis or other clear  acute abdominal pelvic pathology.  There is evidence of moderate hiatal hernia as well as severe hepatic steatosis and diverticulosis.   Official radiology report(s): CT ABDOMEN PELVIS W CONTRAST  Result Date: 07/23/2020 CLINICAL DATA:  Diffuse abdominal pain, nausea and vomiting, and dizziness for 5 weeks. EXAM: CT ABDOMEN AND PELVIS WITH CONTRAST TECHNIQUE: Multidetector CT imaging of the abdomen and pelvis was performed using the standard protocol following bolus administration of intravenous contrast. CONTRAST:  184mL OMNIPAQUE IOHEXOL 300 MG/ML  SOLN COMPARISON:  09/19/2019 FINDINGS: Lower Chest: No acute findings. Hepatobiliary: Moderate to severe diffuse hepatic steatosis is noted. No hepatic masses identified. Gallbladder is unremarkable. No evidence of biliary ductal dilatation. Pancreas:  No mass or inflammatory changes. Spleen: Within normal limits in size and appearance. Adrenals/Urinary Tract: No masses identified. 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 No evidence of ureteral calculi or hydronephrosis. Unremarkable unopacified urinary bladder. Stomach/Bowel: Moderate hiatal hernia is seen. No evidence of obstruction, inflammatory process or abnormal fluid collections. Normal appendix visualized. Diverticulosis is seen mainly involving the sigmoid colon, however there is no evidence of diverticulitis. Vascular/Lymphatic: No pathologically enlarged lymph nodes. No abdominal aortic aneurysm. Aortic atherosclerotic calcification noted. Reproductive:  No mass or other significant abnormality. Other:  None. Musculoskeletal:  No suspicious bone lesions identified. IMPRESSION: No acute findings within the abdomen or pelvis. Moderate hiatal hernia. Moderate to severe hepatic steatosis. Colonic diverticulosis. No radiographic evidence of diverticulitis. Aortic Atherosclerosis (ICD10-I70.0). Electronically Signed   By: Marlaine Hind M.D.   On: 07/23/2020 15:45     ____________________________________________   PROCEDURES  Procedure(s) performed (including Critical Care):  .1-3 Lead EKG Interpretation Performed by: Lucrezia Starch, MD Authorized by: Lucrezia Starch, MD     Interpretation: normal     ECG rate assessment: normal     Rhythm: sinus rhythm     Ectopy: none     Conduction: normal       ____________________________________________   INITIAL IMPRESSION / ASSESSMENT AND PLAN / ED COURSE        Patient presents for assessment of several weeks of persistent nausea vomiting's abdominal pain described above.  Differential includes gastritis versus PUD related to alcohol abuse, pancreatitis, cholecystitis, diverticulitis, metabolic derangements and possible acute infectious gastroenteritis.  ECG has no evidence of clear ischemic changes and given absence of any chest pain suspicion for ACS.  BMP remarkable for bicarb of 20 and anion gap 16 which aspect represents a mild starvation ketosis.  No other significant derangements.  No leukocytosis or acute anemia.  UA has some ketones and moderate blood of unclear etiology but no evidence of acute infection.  Hepatic function panel shows AST of 205 and ALT of 117.  This is elevated compared to 5 months ago when AST was 205 and ALT was 117.  Bili is normal and there is no evidence of cholestasis.  Lipase is 70 is not consistent with acute pancreatitis.  CT abdomen pelvis obtained shows no evidence of diverticulitis, appendicitis, cholecystitis or other clear acute abdominal pelvic pathology.  There is evidence of hiatal hernia and diverticuli as well as aortic atherosclerosis but no clear source of patient's symptoms.  Suspect transaminitis secondary to alcoholic cirrhosis patient continued to drink up to half a beer a day although this seems this is quite decreased from several weeks ago he was drinking up to 12 pack/day.  I suspect he had some mild dehydration today.  Advised him that he  may continue to take Zofran Phenergan which he is doing on outpatient basis but will not prescribe additional antiemetics at this time.  Advised him that he must stop drinking alcohol and send hepatitis panel as well today that he can follow-up on outpatient basis.  Advised him that he will need to follow-up with his PCP and GI for his persistent nausea vomiting abdominal pain.  On reassessment his heart rate had decreased to the mid 90s.  Given otherwise reassuring exam and work-up and duration of symptoms I believe he is safe for continued outpatient evaluation and management.  Patient voiced understanding and agreement this plan.  He was discharged stable condition.  Extensively counseled to cease all alcohol intake.  Strict return cautions advised and discussed.     ____________________________________________   FINAL CLINICAL IMPRESSION(S) / ED DIAGNOSES  Final diagnoses:  Dehydration  Alcohol abuse  Alcoholic cirrhosis, unspecified whether ascites present (HCC)  Atherosclerosis  Chronic gastritis without bleeding, unspecified gastritis type    Medications  lactated ringers bolus 1,000 mL (1,000 mLs Intravenous New Bag/Given 07/23/20 1435)  iohexol (OMNIPAQUE) 300 MG/ML solution 100 mL (100 mLs Intravenous Contrast Given 07/23/20 1458)     ED Discharge Orders    None       Note:  This document was prepared using Dragon voice recognition software and may include unintentional dictation errors.   Lucrezia Starch, MD 07/23/20 [phone]

## 2020-07-23 NOTE — ED Triage Notes (Addendum)
N/V and dizziness x 5 weeks, and feeling "tired". Out of work for 5 weeks due to symptoms. Hx of diverticulitis, pt PCP believes that could be causing the N/V. Pt continues to states "this is not a new issue, its just not getting better". NAD noted at this time.  Pt takes zofran and phenergan at home without relief. Strong and equal strength in extremities.

## 2020-07-23 NOTE — ED Triage Notes (Signed)
First Nurse Note:  Arrives via [facility] for c/o dizziness (ongoing issue).  VS:  P: 110  BP:  170/98.  AOx3.

## 2020-07-23 NOTE — Discharge Instructions (Signed)
You must stop drinking all alcohol.  This is greatly damaging your liver and contributing to your nausea and vomiting.

## 2020-07-23 NOTE — Telephone Encounter (Signed)
I contacted the patient and notified him of Dr. Raliegh Ip results. He verbalize understanding, but is very hesitant about going to the ER. The pt is requesting that Dr. Raliegh Ip direct admit him in the hospital because he doesn't feel like he can sit in the waiting room that long waiting to be seen. I informed the patient that ER triage patients according to the most urgent. We do not have any control over wait time.

## 2020-07-23 NOTE — Telephone Encounter (Signed)
I understand his concerns. Again, there is no new update here as far as I am concerned. This is all consistent with what he has been reporting to [place].  He needs further evaluation.  He may be anemic and it could be severe. We do not have recent tests to show what his lab levels are, therefore, I currently do not know his status.  He will need lab testing as I have ordered it here. Or if he cannot get those, then yes hospital ED is possible next location, if he is dehydrated and cannot keep food or fluids down. I am concerned given the long course of his problem, I do not have a clear diagnosis and it seems he is not absorbing or keeping nutrition.  He has seen [facility] in past for this but that has been since 12/2019, and symptoms have worsened. He has been asked multiple times to contact them to schedule follow-up to address his issues.  He has been given all of the symptom relief medicines that I have including nausea medication and dizziness medication.  Nobie Putnam, DO [facility] 07/23/2020, 10:10 AM

## 2020-07-24 ENCOUNTER — Telehealth: Payer: Self-pay | Admitting: Family Medicine

## 2020-07-24 NOTE — Telephone Encounter (Signed)
This is a little bit of an usual request. The ER physician would have gone over his lab results before he was discharged.  He had notable dehydration, electrolyte imbalance, elevated abnormal liver enzymes that do reflect alcohol intake consistent with liver problem.  Any further detailed discussion will need to be discussed at hospital follow-up.  Nobie Putnam, DO [facility] 07/24/2020, 9:05 AM

## 2020-07-24 NOTE — Telephone Encounter (Signed)
Pt has been scheduled.  °

## 2020-07-24 NOTE — Telephone Encounter (Signed)
Pt went [facility] yesterday and would like dr Raliegh Ip to review the blood work they did yesterday

## 2020-07-24 NOTE — Telephone Encounter (Signed)
The pt was notified of Dr. Raliegh Ip recommendation. I also informed him that he need to schedule a hospital f/u and Gastro appt. He said he will do that at a later time.

## 2020-07-24 NOTE — Telephone Encounter (Signed)
The pt called requesting you to review his labs from yesterday ER visit. He was seen and diagnosed with Dehydration, Alcohol abuse, Alcoholic cirrhosis, Arthrosclerosis, and chronic gastritis. They recommended that the patient schedule a f/u visit with Gastroenterology and hospital f/u with his PCP.

## 2020-07-27 ENCOUNTER — Ambulatory Visit: Payer: Commercial Managed Care - PPO | Admitting: Family Medicine

## 2020-07-27 NOTE — Telephone Encounter (Signed)
Copied from [place] [id number]. Topic: General - Other >> Jul 27, 2020 10:36 AM Erick Blinks wrote: Reason for CRM: Requesting a doctor's note that covers his sick leave from January 06/24/2020 until March 1st 2022. Please advise, pt states that he feels better now and is ready to return to work. He prefers for this to be uploaded via mychart.

## 2020-07-28 ENCOUNTER — Encounter: Payer: Self-pay | Admitting: Emergency Medicine

## 2020-07-28 ENCOUNTER — Observation Stay
Admission: EM | Admit: 2020-07-28 | Discharge: 2020-07-31 | Disposition: A | Discharge status: Home / Self Care | Payer: Commercial Managed Care - PPO | Attending: Internal Medicine | Admitting: Internal Medicine

## 2020-07-28 DIAGNOSIS — Z86010 Personal history of colonic polyps: Secondary | ICD-10-CM | POA: Diagnosis not present

## 2020-07-28 DIAGNOSIS — F10230 Alcohol dependence with withdrawal, uncomplicated: Secondary | ICD-10-CM

## 2020-07-28 DIAGNOSIS — K219 Gastro-esophageal reflux disease without esophagitis: Secondary | ICD-10-CM | POA: Diagnosis present

## 2020-07-28 DIAGNOSIS — E876 Hypokalemia: Secondary | ICD-10-CM | POA: Diagnosis present

## 2020-07-28 DIAGNOSIS — R220 Localized swelling, mass and lump, head: Secondary | ICD-10-CM | POA: Diagnosis present

## 2020-07-28 DIAGNOSIS — F10239 Alcohol dependence with withdrawal, unspecified: Secondary | ICD-10-CM | POA: Diagnosis not present

## 2020-07-28 DIAGNOSIS — Z20822 Contact with and (suspected) exposure to covid-19: Secondary | ICD-10-CM | POA: Diagnosis not present

## 2020-07-28 DIAGNOSIS — T783XXA Angioneurotic edema, initial encounter: Principal | ICD-10-CM | POA: Diagnosis present

## 2020-07-28 DIAGNOSIS — I10 Essential (primary) hypertension: Secondary | ICD-10-CM

## 2020-07-28 DIAGNOSIS — F1010 Alcohol abuse, uncomplicated: Secondary | ICD-10-CM | POA: Diagnosis not present

## 2020-07-28 DIAGNOSIS — Z8616 Personal history of COVID-19: Secondary | ICD-10-CM | POA: Insufficient documentation

## 2020-07-28 DIAGNOSIS — Z79899 Other long term (current) drug therapy: Secondary | ICD-10-CM | POA: Insufficient documentation

## 2020-07-28 DIAGNOSIS — Z87891 Personal history of nicotine dependence: Secondary | ICD-10-CM | POA: Insufficient documentation

## 2020-07-28 DIAGNOSIS — F10930 Alcohol use, unspecified with withdrawal, uncomplicated: Secondary | ICD-10-CM

## 2020-07-28 DIAGNOSIS — F10939 Alcohol use, unspecified with withdrawal, unspecified: Secondary | ICD-10-CM | POA: Insufficient documentation

## 2020-07-28 LAB — CBC
HCT: 36.5 % — ABNORMAL LOW (ref 39.0–52.0)
Hemoglobin: 13.5 g/dL (ref 13.0–17.0)
MCH: 35.2 pg — ABNORMAL HIGH (ref 26.0–34.0)
MCHC: 37 g/dL — ABNORMAL HIGH (ref 30.0–36.0)
MCV: 95.1 fL (ref 80.0–100.0)
Platelets: 205 10*3/uL (ref 150–400)
RBC: 3.84 MIL/uL — ABNORMAL LOW (ref 4.22–5.81)
RDW: 13.3 % (ref 11.5–15.5)
WBC: 9.6 10*3/uL (ref 4.0–10.5)
nRBC: 0 % (ref 0.0–0.2)

## 2020-07-28 LAB — MAGNESIUM: Magnesium: 1.9 mg/dL (ref 1.7–2.4)

## 2020-07-28 LAB — COMPREHENSIVE METABOLIC PANEL
ALT: 56 U/L — ABNORMAL HIGH (ref 0–44)
AST: 51 U/L — ABNORMAL HIGH (ref 15–41)
Albumin: 4 g/dL (ref 3.5–5.0)
Alkaline Phosphatase: 48 U/L (ref 38–126)
Anion gap: 12 (ref 5–15)
BUN: 6 mg/dL (ref 6–20)
CO2: 24 mmol/L (ref 22–32)
Calcium: 8.8 mg/dL — ABNORMAL LOW (ref 8.9–10.3)
Chloride: 97 mmol/L — ABNORMAL LOW (ref 98–111)
Creatinine, Ser: 0.55 mg/dL — ABNORMAL LOW (ref 0.61–1.24)
GFR, Estimated: >60 mL/min (ref >60)
Glucose, Bld: 106 mg/dL — ABNORMAL HIGH (ref 70–99)
Potassium: 3.1 mmol/L — ABNORMAL LOW (ref 3.5–5.1)
Sodium: 133 mmol/L — ABNORMAL LOW (ref 135–145)
Total Bilirubin: 1.3 mg/dL — ABNORMAL HIGH (ref 0.3–1.2)
Total Protein: 6.9 g/dL (ref 6.5–8.1)

## 2020-07-28 LAB — RESP PANEL BY RT-PCR (FLU A&B, COVID) ARPGX2
Influenza A by PCR: NEGATIVE
Influenza B by PCR: NEGATIVE
SARS Coronavirus 2 by RT PCR: NEGATIVE

## 2020-07-28 LAB — LIPASE, BLOOD: Lipase: 54 U/L — ABNORMAL HIGH (ref 11–51)

## 2020-07-28 MED ORDER — DIPHENHYDRAMINE HCL 50 MG/ML IJ SOLN
50.0000 mg | Freq: Once | INTRAMUSCULAR | Status: AC
  Administered 2020-07-28: 50 mg via INTRAVENOUS
  Filled 2020-07-28: qty 1

## 2020-07-28 MED ORDER — FLUTICASONE PROPIONATE 50 MCG/ACT NA SUSP
2.0000 | Freq: Every day | NASAL | Status: DC | PRN
  Filled 2020-07-28: qty 16

## 2020-07-28 MED ORDER — METHYLPREDNISOLONE SODIUM SUCC 40 MG IJ SOLR
40.0000 mg | Freq: Two times a day (BID) | INTRAMUSCULAR | Status: DC
  Administered 2020-07-28 – 2020-07-29 (×2): 40 mg via INTRAVENOUS
  Filled 2020-07-28 (×2): qty 1

## 2020-07-28 MED ORDER — AMLODIPINE BESYLATE 10 MG PO TABS
10.0000 mg | ORAL_TABLET | Freq: Every day | ORAL | Status: DC
  Administered 2020-07-29 – 2020-07-31 (×3): 10 mg via ORAL
  Filled 2020-07-28 (×3): qty 1

## 2020-07-28 MED ORDER — SODIUM CHLORIDE 0.9 % IV SOLN: INTRAVENOUS | Status: DC

## 2020-07-28 MED ORDER — FAMOTIDINE IN NACL 20-0.9 MG/50ML-% IV SOLN
20.0000 mg | Freq: Once | INTRAVENOUS | Status: AC
  Administered 2020-07-28: 20 mg via INTRAVENOUS
  Filled 2020-07-28: qty 50

## 2020-07-28 MED ORDER — LORAZEPAM 2 MG PO TABS
0.0000 mg | ORAL_TABLET | Freq: Four times a day (QID) | ORAL | Status: AC
  Administered 2020-07-28 (×3): 2 mg via ORAL
  Administered 2020-07-29 – 2020-07-30 (×5): 1 mg via ORAL
  Filled 2020-07-28 (×8): qty 1

## 2020-07-28 MED ORDER — LORAZEPAM 2 MG/ML IJ SOLN
1.0000 mg | Freq: Once | INTRAMUSCULAR | Status: AC
  Administered 2020-07-28: 1 mg via INTRAVENOUS
  Filled 2020-07-28: qty 1

## 2020-07-28 MED ORDER — PANTOPRAZOLE SODIUM 40 MG PO TBEC
40.0000 mg | DELAYED_RELEASE_TABLET | Freq: Every day | ORAL | Status: DC
  Administered 2020-07-28 – 2020-07-31 (×4): 40 mg via ORAL
  Filled 2020-07-28 (×4): qty 1

## 2020-07-28 MED ORDER — CHLORDIAZEPOXIDE HCL 25 MG PO CAPS
25.0000 mg | ORAL_CAPSULE | Freq: Three times a day (TID) | ORAL | Status: DC
  Administered 2020-07-28 – 2020-07-31 (×9): 25 mg via ORAL
  Filled 2020-07-28 (×9): qty 1

## 2020-07-28 MED ORDER — MECLIZINE HCL 25 MG PO TABS
25.0000 mg | ORAL_TABLET | Freq: Three times a day (TID) | ORAL | Status: DC | PRN
  Filled 2020-07-28: qty 1

## 2020-07-28 MED ORDER — LORAZEPAM 2 MG/ML IJ SOLN: 0.0000 mg | Freq: Two times a day (BID) | INTRAMUSCULAR | Status: DC

## 2020-07-28 MED ORDER — ALBUTEROL SULFATE HFA 108 (90 BASE) MCG/ACT IN AERS
2.0000 | INHALATION_SPRAY | RESPIRATORY_TRACT | Status: DC | PRN
  Filled 2020-07-28: qty 6.7

## 2020-07-28 MED ORDER — LORAZEPAM 2 MG/ML IJ SOLN: 0.0000 mg | Freq: Four times a day (QID) | INTRAMUSCULAR | Status: AC

## 2020-07-28 MED ORDER — SODIUM CHLORIDE 0.9 % IV BOLUS
1000.0000 mL | Freq: Once | INTRAVENOUS | Status: AC
  Administered 2020-07-28: 1000 mL via INTRAVENOUS

## 2020-07-28 MED ORDER — CHLORDIAZEPOXIDE HCL 25 MG PO CAPS
25.0000 mg | ORAL_CAPSULE | Freq: Once | ORAL | Status: AC
  Administered 2020-07-28: 25 mg via ORAL
  Filled 2020-07-28: qty 1

## 2020-07-28 MED ORDER — HYDRALAZINE HCL 20 MG/ML IJ SOLN: 5.0000 mg | INTRAMUSCULAR | Status: DC | PRN

## 2020-07-28 MED ORDER — THIAMINE HCL 100 MG PO TABS
100.0000 mg | ORAL_TABLET | Freq: Every day | ORAL | Status: DC
  Administered 2020-07-29 – 2020-07-31 (×3): 100 mg via ORAL
  Filled 2020-07-28 (×3): qty 1

## 2020-07-28 MED ORDER — THIAMINE HCL 100 MG/ML IJ SOLN
100.0000 mg | Freq: Once | INTRAMUSCULAR | Status: AC
  Administered 2020-07-28: 100 mg via INTRAVENOUS
  Filled 2020-07-28: qty 2

## 2020-07-28 MED ORDER — ONDANSETRON HCL 4 MG/2ML IJ SOLN: 4.0000 mg | Freq: Three times a day (TID) | INTRAMUSCULAR | Status: DC | PRN

## 2020-07-28 MED ORDER — DIPHENHYDRAMINE HCL 50 MG/ML IJ SOLN
25.0000 mg | Freq: Two times a day (BID) | INTRAMUSCULAR | Status: DC
  Administered 2020-07-28 – 2020-07-30 (×4): 25 mg via INTRAVENOUS
  Filled 2020-07-28 (×5): qty 1

## 2020-07-28 MED ORDER — LORAZEPAM 2 MG PO TABS
0.0000 mg | ORAL_TABLET | Freq: Two times a day (BID) | ORAL | Status: DC
  Administered 2020-07-30 – 2020-07-31 (×2): 1 mg via ORAL
  Filled 2020-07-28 (×2): qty 1

## 2020-07-28 MED ORDER — THIAMINE HCL 100 MG/ML IJ SOLN
100.0000 mg | Freq: Every day | INTRAMUSCULAR | Status: DC
  Filled 2020-07-28: qty 2

## 2020-07-28 MED ORDER — EPINEPHRINE 0.3 MG/0.3ML IJ SOAJ
0.3000 mg | Freq: Once | INTRAMUSCULAR | Status: AC
  Administered 2020-07-28: 0.3 mg via INTRAMUSCULAR
  Filled 2020-07-28: qty 0.3

## 2020-07-28 MED ORDER — ENOXAPARIN SODIUM 40 MG/0.4ML ~~LOC~~ SOLN
40.0000 mg | SUBCUTANEOUS | Status: DC
  Administered 2020-07-28 – 2020-07-30 (×3): 40 mg via SUBCUTANEOUS
  Filled 2020-07-28 (×3): qty 0.4

## 2020-07-28 MED ORDER — FAMOTIDINE IN NACL 20-0.9 MG/50ML-% IV SOLN
20.0000 mg | Freq: Two times a day (BID) | INTRAVENOUS | Status: DC
  Administered 2020-07-28 – 2020-07-30 (×4): 20 mg via INTRAVENOUS
  Filled 2020-07-28 (×5): qty 50

## 2020-07-28 MED ORDER — SODIUM CHLORIDE 0.9 % IV BOLUS (SEPSIS)
1000.0000 mL | Freq: Once | INTRAVENOUS | Status: AC
  Administered 2020-07-28: 1000 mL via INTRAVENOUS

## 2020-07-28 MED ORDER — METHYLPREDNISOLONE SODIUM SUCC 125 MG IJ SOLR
125.0000 mg | Freq: Once | INTRAMUSCULAR | Status: AC
  Administered 2020-07-28: 125 mg via INTRAVENOUS
  Filled 2020-07-28: qty 2

## 2020-07-28 NOTE — Plan of Care (Signed)
  Problem: Health Behavior/Discharge Planning: Goal: Ability to manage health-related needs will improve Outcome: Not Met (add Reason)   Problem: Clinical Measurements: Goal: Ability to maintain clinical measurements within normal limits will improve Outcome: Not Met (add Reason) Goal: Will remain free from infection Outcome: Not Met (add Reason) Goal: Diagnostic test results will improve Outcome: Not Met (add Reason) Goal: Respiratory complications will improve Outcome: Not Met (add Reason) Goal: Cardiovascular complication will be avoided Outcome: Not Met (add Reason)   Problem: Activity: Goal: Risk for activity intolerance will decrease Outcome: Not Met (add Reason)   Problem: Nutrition: Goal: Adequate nutrition will be maintained Outcome: Not Met (add Reason)   Problem: Health Behavior/Discharge Planning: Goal: Ability to identify changes in lifestyle to reduce recurrence of condition will improve Outcome: Not Met (add Reason) Goal: Identification of resources available to assist in meeting health care needs will improve Outcome: Not Met (add Reason)   Problem: Physical Regulation: Goal: Complications related to the disease process, condition or treatment will be avoided or minimized Outcome: Not Met (add Reason)   Problem: Safety: Goal: Ability to remain free from injury will improve Outcome: Not Met (add Reason)

## 2020-07-28 NOTE — ED Notes (Signed)
No benadryl PTA

## 2020-07-28 NOTE — ED Provider Notes (Signed)
[facility] Emergency Department Provider Note  ____________________________________________   Event Date/Time   First MD Initiated Contact with Patient 07/28/20 [id number]     (approximate)  I have reviewed the triage vital signs and the nursing notes.   HISTORY  Chief Complaint Allergic Reaction and Oral Swelling    HPI Kaeden Mester Ochs is a 60 y.o. male with history of alcohol abuse, hypertension on amlodipine who presents to the emergency department with swelling of his lips.  States this was present when he woke up this morning.  Went to sleep feeling fine.  States he feels his lip swelling has gotten worse and he feels that his tongue is mildly swollen.  No difficulty swallowing, speaking but does feel short of breath.  No wheezing.  No new rash, hives.  He has a chronic rash to his face which is unchanged.  No new exposures.  Denies new soaps, lotions, detergents, medications.  He is not on ACE inhibitor or an ARB.  No history of the same.  No family history of angioedema.     He reports that he was previously drinking about a 12 pack of beer every day but recently cut down on this and has only been drinking 3 beers a day.  States his last drink was about 2 days ago.  He states he does become tremulous when he does not drink but no history of withdrawal seizure.     Past Medical History:  Diagnosis Date  . Diverticulosis   . Gastric ulcer   . GERD (gastroesophageal reflux disease)   . History of hiatal hernia   . Hypertension   . Iron deficiency anemia 10/18/2019  . Recurrent umbilical hernia with incarceration 11/08/2016   Previously repaired x 1    Patient Active Problem List   Diagnosis Date Noted  . COVID-19 02/19/2020  . Cough 02/19/2020  . Diarrhea   . Abdominal pain, epigastric   . Iron deficiency anemia 10/18/2019  . GERD (gastroesophageal reflux disease) 09/19/2019  . Acute diverticulitis 09/19/2019  . Alcohol abuse 09/19/2019  .  Essential tremor 07/04/2019  . Hx of adenomatous colonic polyps 01/06/2017  . Herniated lumbar intervertebral disc 05/18/2016  . Mixed hyperlipidemia 05/06/2014  . 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 Insomnia 03/03/2014  . Diverticulosis of large intestine without hemorrhage 03/03/2014  . Alcohol abuse, daily use 03/03/2014  . Essential hypertension 07/08/2011  . Hiatal hernia with gastroesophageal reflux 07/07/2011  . Lumbago with sciatica 07/07/2011    Past Surgical History:  Procedure Laterality Date  . BACK SURGERY    . COLONOSCOPY WITH PROPOFOL N/A 12/30/2016   Procedure: COLONOSCOPY WITH PROPOFOL;  Surgeon: [name], MD;  Location: [facility] ENDOSCOPY;  Service: Endoscopy;  Laterality: N/A;  . COLONOSCOPY WITH PROPOFOL N/A 10/31/2019   Procedure: COLONOSCOPY WITH PROPOFOL;  Surgeon: Lin Landsman, MD;  Location: [facility] ENDOSCOPY;  Service: Gastroenterology;  Laterality: N/A;  . ESOPHAGOGASTRODUODENOSCOPY (EGD) WITH PROPOFOL N/A 12/30/2016   Procedure: ESOPHAGOGASTRODUODENOSCOPY (EGD) WITH PROPOFOL;  Surgeon: [name], MD;  Location[facility] ENDOSCOPY;  Service: Endoscopy;  Laterality: N/A;  . ESOPHAGOGASTRODUODENOSCOPY (EGD) WITH PROPOFOL N/A 10/31/2019   Procedure: ESOPHAGOGASTRODUODENOSCOPY (EGD) WITH PROPOFOL;  Surgeon: Lin Landsman, MD;  Location: [facility] ENDOSCOPY;  Service: Gastroenterology;  Laterality: N/A;  . HERNIA REPAIR  [id number]   Umbilical Hernia Repair  . LUMBAR LAMINECTOMY/DECOMPRESSION MICRODISCECTOMY Right 05/18/2016   Procedure: right L4-5 microdiscectomy;  Surgeon: Blanche East, MD;  Location: [facility] ORS;  Service: Neurosurgery;  Laterality: Right;  . TONSILLECTOMY    . UMBILICAL HERNIA REPAIR N/A 06/29/2018   Procedure: LAPAROSCOPIC REPAIR OF RECURRENT UMBILICAL HERNIA WITH MESH;  Surgeon: Vickie Epley, MD;  Location: [facility] ORS;  Service: General;  Laterality: N/A;    Prior to Admission medications   Medication Sig Start Date  End Date Taking? Authorizing Provider  albuterol (VENTOLIN HFA) 108 (90 Base) MCG/ACT inhaler INHALE 1-2 PUFFS INTO THE LUNGS EVERY 4 (FOUR) HOURS AS NEEDED FOR WHEEZING OR SHORTNESS OF BREATH. 04/11/20   Karamalegos, [name], DO  amLODipine-benazepril (LOTREL) 10-40 MG capsule TAKE 1 CAPSULE BY MOUTH EVERY DAY 07/21/20   Karamalegos, [name], DO  fluticasone (FLONASE) 50 MCG/ACT nasal spray PLACE 2 SPRAYS INTO BOTH NOSTRILS DAILY. USE FOR 4-6 WEEKS THEN STOP AND USE SEASONALLY OR AS NEEDED. 07/20/20   Parks Ranger, [name], DO  meclizine (ANTIVERT) 25 MG tablet Take 1 tablet (25 mg total) by mouth 3 (three) times daily as needed for dizziness or nausea. 06/24/20   Karamalegos, [name], DO  omeprazole (PRILOSEC) 40 MG capsule TAKE 1 CAPSULE BY MOUTH TWICE A DAY 06/16/20   Karamalegos, Alexander J, DO  ondansetron (ZOFRAN-ODT) 4 MG disintegrating tablet TAKE 1 TABLET BY MOUTH EVERY 8 HOURS AS NEEDED FOR NAUSEA AND VOMITING 07/21/20   Parks Ranger, [name], DO  promethazine (PHENERGAN) 25 MG tablet TAKE 1/2 TO 1 TABLET BY MOUTH EVERY 8 (EIGHT) HOURS AS NEEDED FOR NAUSEA OR VOMITING. 07/21/20   Olin Hauser, DO  Vitamins/Minerals TABS Take by mouth.    [provider]    Allergies Nsaids  Family History  Problem Relation Age of Onset  . Hypertension Mother   . Cancer Mother        Ovarian  . Cervical cancer Mother   . Hypertension Father   . Cancer Father        tongue  . Throat cancer Father   . Hypertension Brother   . Prostate cancer Neg Hx   . Breast cancer Neg Hx   . Colon cancer Neg Hx   . Heart attack Neg Hx   . Stroke Neg Hx     Social History Social History   Tobacco Use  . Smoking status: Former Smoker    Packs/day: 2.00    Years: 25.00    Pack years: 50.00    Types: Cigarettes    Quit date: 05/31/1987    Years since quitting: 33.1  . Smokeless tobacco: Never Used  Vaping Use  . Vaping Use: Never used  Substance Use Topics  .  Alcohol use: Yes    Comment: 8-10 beers per day  . Drug use: No    Review of Systems Constitutional: No fever. Eyes: No visual changes. ENT: No sore throat. Cardiovascular: Denies chest pain. Respiratory: Denies shortness of breath. Gastrointestinal: No nausea, vomiting, diarrhea. Genitourinary: Negative for dysuria. Musculoskeletal: Negative for back pain. Skin: Negative for rash. Neurological: Negative for focal weakness or numbness.  ____________________________________________   PHYSICAL EXAM:  VITAL SIGNS: ED Triage Vitals [07/28/20 0633]  Enc Vitals Group     BP 117/80     Pulse Rate (!) 135     Resp 20     Temp 99.1 F (37.3 C)     Temp Source Oral     SpO2 98 %     Weight      Height      Head Circumference      Peak Flow      Pain  Score      Pain Loc      Pain Edu?      Excl. in [place]?    CONSTITUTIONAL: Alert and oriented and responds appropriately to questions. Well-appearing; well-nourished HEAD: Normocephalic EYES: Conjunctivae clear, pupils appear equal, EOM appear intact ENT: normal nose; moist mucous membranes; No pharyngeal erythema or petechiae, no tonsillar hypertrophy or exudate, no uvular deviation, uvular swelling, no unilateral swelling, no trismus or drooling, no muffled voice, normal phonation, no stridor, no Ludwig's angina, tongue sits flat in the bottom of the mouth, no angioedema, no facial erythema or warmth, no facial swelling; no pain with movement of the neck, no cervical LAD.  Patient has significant angioedema of his upper and lower lip.  His tongue appears normal without any noticeable swelling.  Posterior oropharynx is patent. NECK: Supple, normal ROM CARD: Regular and tachycardic; S1 and S2 appreciated; no murmurs, no clicks, no rubs, no gallops RESP: Normal chest excursion without splinting or tachypnea; breath sounds clear and equal bilaterally; no wheezes, no rhonchi, no rales, no hypoxia or respiratory distress, speaking full  sentences ABD/GI: Normal bowel sounds; non-distended; soft, non-tender, no rebound, no guarding, no peritoneal signs, no hepatosplenomegaly BACK: The back appears normal EXT: Normal ROM in all joints; no deformity noted, no edema; no cyanosis SKIN: Normal color for age and race; warm; no rash on exposed skin, no urticaria NEURO: Moves all extremities equally, normal speech, no facial asymmetry, mildly tremulous, no asterixis PSYCH: The patient's mood and manner are appropriate.  ____________________________________________   LABS (all labs ordered are listed, but only abnormal results are displayed)  Labs Reviewed  CBC - Abnormal; Notable for the following components:      Result Value   RBC 3.84 (*)    HCT 36.5 (*)    MCH 35.2 (*)    MCHC 37.0 (*)    All other components within normal limits  COMPREHENSIVE METABOLIC PANEL - Abnormal; Notable for the following components:   Sodium 133 (*)    Potassium 3.1 (*)    Chloride 97 (*)    Glucose, Bld 106 (*)    Creatinine, Ser 0.55 (*)    Calcium 8.8 (*)    AST 51 (*)    ALT 56 (*)    Total Bilirubin 1.3 (*)    All other components within normal limits  RESP PANEL BY RT-PCR (FLU A&B, COVID) ARPGX2  LIPASE, BLOOD   ____________________________________________  EKG   EKG Interpretation  Date/Time:  Tuesday July 28 2020 07:45:09 EST Ventricular Rate:  101 PR Interval:    QRS Duration: 99 QT Interval:  358 QTC Calculation: 464 R Axis:   1 Text Interpretation: Sinus tachycardia RSR' in V1 or V2, right VCD or RVH Left ventricular hypertrophy Borderline T abnormalities, inferior leads Confirmed by Pryor Curia [phone]) on 07/28/2020 7:50:47 AM       ____________________________________________  RADIOLOGY Jessie Foot Remell Giaimo, personally viewed and evaluated these images (plain radiographs) as part of my medical decision making, as well as reviewing the written report by the radiologist.  ED MD interpretation:  none  Official  radiology report(s): No results found.  ____________________________________________   PROCEDURES  Procedure(s) performed (including Critical Care):  Procedures  CRITICAL CARE Performed by: Pryor Curia   Total critical care time: 50 minutes  Critical care time was exclusive of separately billable procedures and treating other patients.  Critical care was necessary to treat or prevent imminent or life-threatening deterioration.  Critical care was time spent personally  by me on the following activities: development of treatment plan with patient and/or surrogate as well as nursing, discussions with consultants, evaluation of patient's response to treatment, examination of patient, obtaining history from patient or surrogate, ordering and performing treatments and interventions, ordering and review of laboratory studies, ordering and review of radiographic studies, pulse oximetry and re-evaluation of patient's condition.  ____________________________________________   INITIAL IMPRESSION / ASSESSMENT AND PLAN / ED COURSE  As part of my medical decision making, I reviewed the following data within the [place] notes reviewed and incorporated, Labs reviewed  and Notes from prior ED visits         Patient here with angioedema that he feels is getting worse.  Seems to mostly involve the lips.  Tongue does not appear swollen and there is no swelling of the posterior oropharynx.  His voice is normal.  Satting 100% on room air at rest at this time.  His lungs are clear.  No urticaria or hypotension.  He is not on an ACE inhibitor or ARB.  He denies family history of angioedema.  He has never had similar symptoms.  Denies any new exposures.  Will give epi, Benadryl, Solu-Medrol, Pepcid and monitor closely.  Patient does have some signs of alcohol withdrawal as well on exam.  Will give IV fluids, Ativan, thiamine and continue to closely monitor.  ED  PROGRESS  7:50 AM Pt stable.  He reports his angioedema feels the same.  No improvement or significant worsening.  Patient on CIWA protocol at this time for alcohol withdrawal.  Labs show a minimally elevated AST and ALT consistent with history of alcohol abuse.  Potassium level is 3.1.  Magnesium pending.  Signed out to Dr. Quentin Cornwall.  May need admission for ETOH witdrawl and angioedema.  I reviewed all nursing notes and pertinent previous records as available.  I have reviewed and interpreted any EKGs, lab and urine results, imaging (as available).  ____________________________________________   FINAL CLINICAL IMPRESSION(S) / ED DIAGNOSES  Final diagnoses:  Angioedema, initial encounter  Alcohol withdrawal syndrome without complication [facility])     ED Discharge Orders    None      *Please note:  Kosta Schnitzler Romito was evaluated in Emergency Department on 07/28/2020 for the symptoms described in the history of present illness. He was evaluated in the context of the global COVID-19 pandemic, which necessitated consideration that the patient might be at risk for infection with the SARS-CoV-2 virus that causes COVID-19. Institutional protocols and algorithms that pertain to the evaluation of patients at risk for COVID-19 are in a state of rapid change based on information released by regulatory bodies including the CDC and federal and state organizations. These policies and algorithms were followed during the patient's care in the ED.  Some ED evaluations and interventions may be delayed as a result of limited staffing during and the pandemic.*   Note:  This document was prepared using Dragon voice recognition software and may include unintentional dictation errors.   Omere Marti, Delice Bison, DO 07/28/20 [phone]

## 2020-07-28 NOTE — ED Notes (Signed)
Lunch meal tray given at this time.  

## 2020-07-28 NOTE — ED Notes (Signed)
Transport requested

## 2020-07-28 NOTE — H&P (Signed)
History and Physical    Luis Porter JXB:[id number] DOB: 1961-01-17 DOA: 07/28/2020  Referring MD/NP/PA:   PCP: Olin Hauser, DO   Patient coming from:  The patient is coming from home.  At baseline, pt is independent for most of ADL.        Chief Complaint: lip swelling  HPI: Luis Porter is a 60 y.o. male with medical history significant of hypertension, hyperlipidemia, GERD, gastric ulcer disease, diverticulitis, alcohol abuse, BPH, COVID-19 infection, essential tremor, who presents with sleep swelling.  Patient states that he noted lip swelling in the early morning. He also has tongue swelling, some throat tightness and itchiness later on. He states that feels the need to clear throat a lot. He denies chest pain, shortness of breath, cough, fever or chills.  No nausea vomiting, diarrhea, abdominal pain, symptoms of UTI.  Recently no new medications, soaps, lotion, detergent. Of note, patient is taking amlodipine/benazepril for high blood pressure.  Patient is tremulous. Patient was treated with epi, 125 mg of Solu-Medrol, 20 mg Pepcid and 50 mg of Benadryl with significant improvement of lip swelling and other symptoms in ED.   ED Course: pt was found to have WBC 9.6, lipase 54, negative Covid PCR, potassium 3.1, renal function okay, temperature 99.1, blood pressure 127/85, heart rate 115, 96, RR 20, 18, oxygen saturation 96% on room air.  Patient is placed on MedSurg bed for observation.  Review of Systems:   General: no fevers, chills, no body weight gain, has fatigue HEENT: no blurry vision, hearing changes. Has lip swelling, tongue swelling and discomfort in throat. Respiratory: no dyspnea, coughing, wheezing CV: no chest pain, no palpitations GI: no nausea, vomiting, abdominal pain, diarrhea, constipation GU: no dysuria, burning on urination, increased urinary frequency, hematuria  Ext: no leg edema Neuro: no unilateral weakness, numbness, or  tingling, no vision change or hearing loss Skin: no rash, no skin tear. MSK: No muscle spasm, no deformity, no limitation of range of movement in spin Heme: No easy bruising.  Travel history: No recent long distant travel.  Allergy:  Allergies  Allergen Reactions  . Benazepril     Angioedema  . Nsaids Other (See Comments)    Due to history of ulcers     Past Medical History:  Diagnosis Date  . Diverticulosis   . Gastric ulcer   . GERD (gastroesophageal reflux disease)   . History of hiatal hernia   . Hypertension   . Iron deficiency anemia 10/18/2019  . 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 BACK SURGERY    . COLONOSCOPY WITH PROPOFOL N/A 12/30/2016   Procedure: COLONOSCOPY WITH PROPOFOL;  Surgeon: [name], MD;  Location: [facility] ENDOSCOPY;  Service: Endoscopy;  Laterality: N/A;  . COLONOSCOPY WITH PROPOFOL N/A 10/31/2019   Procedure: COLONOSCOPY WITH PROPOFOL;  Surgeon: Lin Landsman, MD;  Location: [facility] ENDOSCOPY;  Service: Gastroenterology;  Laterality: N/A;  . ESOPHAGOGASTRODUODENOSCOPY (EGD) WITH PROPOFOL N/A 12/30/2016   Procedure: ESOPHAGOGASTRODUODENOSCOPY (EGD) WITH PROPOFOL;  Surgeon: [name], MD;  Location: [facility] ENDOSCOPY;  Service: Endoscopy;  Laterality: N/A;  . ESOPHAGOGASTRODUODENOSCOPY (EGD) WITH PROPOFOL N/A 10/31/2019   Procedure: ESOPHAGOGASTRODUODENOSCOPY (EGD) WITH PROPOFOL;  Surgeon: Lin Landsman, MD;  Location: [facility] ENDOSCOPY;  Service: Gastroenterology;  Laterality: N/A;  . HERNIA REPAIR  1308   Umbilical Hernia Repair  . LUMBAR LAMINECTOMY/DECOMPRESSION MICRODISCECTOMY Right 05/18/2016   Procedure: right L4-5  microdiscectomy;  Surgeon: Blanche East, MD;  Location: [facility] ORS;  Service: Neurosurgery;  Laterality: Right;  . TONSILLECTOMY    . UMBILICAL HERNIA REPAIR N/A 06/29/2018   Procedure: LAPAROSCOPIC REPAIR OF RECURRENT UMBILICAL  HERNIA WITH MESH;  Surgeon: Vickie Epley, MD;  Location: [facility] ORS;  Service: General;  Laterality: N/A;    Social History:  reports that he quit smoking about 33 years ago. His smoking use included cigarettes. He has a 50.00 pack-year smoking history. He has never used smokeless tobacco. He reports current alcohol use. He reports that he does not use drugs.  Family History:  Family History  Problem Relation Age of Onset  . Hypertension Mother   . Cancer Mother        Ovarian  . Cervical cancer Mother   . Hypertension Father   . Cancer Father        tongue  . Throat cancer Father   . Hypertension Brother   . Prostate cancer Neg Hx   . Breast cancer Neg Hx   . Colon cancer Neg Hx   . Heart attack Neg Hx   . Stroke Neg Hx      Prior to Admission medications   Medication Sig Start Date End Date Taking? Authorizing Provider  albuterol (VENTOLIN HFA) 108 (90 Base) MCG/ACT inhaler INHALE 1-2 PUFFS INTO THE LUNGS EVERY 4 (FOUR) HOURS AS NEEDED FOR WHEEZING OR SHORTNESS OF BREATH. 04/11/20   Karamalegos, [name], DO  amLODipine-benazepril (LOTREL) 10-40 MG capsule TAKE 1 CAPSULE BY MOUTH EVERY DAY 07/21/20   Karamalegos, [name], DO  fluticasone (FLONASE) 50 MCG/ACT nasal spray PLACE 2 SPRAYS INTO BOTH NOSTRILS DAILY. USE FOR 4-6 WEEKS THEN STOP AND USE SEASONALLY OR AS NEEDED. 07/20/20   Parks Ranger, [name], DO  meclizine (ANTIVERT) 25 MG tablet Take 1 tablet (25 mg total) by mouth 3 (three) times daily as needed for dizziness or nausea. 06/24/20   Karamalegos, [name], DO  omeprazole (PRILOSEC) 40 MG capsule TAKE 1 CAPSULE BY MOUTH TWICE A DAY 06/16/20   Karamalegos, Alexander J, DO  ondansetron (ZOFRAN-ODT) 4 MG disintegrating tablet TAKE 1 TABLET BY MOUTH EVERY 8 HOURS AS NEEDED FOR NAUSEA AND VOMITING 07/21/20   Parks Ranger, [name], DO  promethazine (PHENERGAN) 25 MG tablet TAKE 1/2 TO 1 TABLET BY MOUTH EVERY 8 (EIGHT) HOURS AS NEEDED FOR NAUSEA OR VOMITING. 07/21/20    Olin Hauser, DO  Vitamins/Minerals TABS Take by mouth.    [provider]    Physical Exam: Vitals:   07/28/20 0800 07/28/20 1000 07/28/20 1115 07/28/20 1130  BP: 127/85 110/82 120/72 114/69  Pulse: 96 (!) 107 97 99  Resp: 18 17  20   Temp:      TempSrc:      SpO2: 96% 95% 94% 93%   General: Not in acute distress HEENT: Has lip swelling which has improved compared with the picture he took at home.       Eyes: PERRL, EOMI, no scleral icterus.       ENT: No discharge from the ears and nose, no pharynx injection, no tonsillar enlargement.        Neck: No JVD, no bruit, no mass felt. Heme: No neck lymph node enlargement. Cardiac: S1/S2, RRR, No murmurs, No gallops or rubs. Respiratory: No rales, wheezing, rhonchi or rubs. GI: Soft, nondistended, nontender, no rebound pain, no organomegaly, BS present. GU: No hematuria Ext: No pitting leg edema bilaterally. 1+DP/PT pulse bilaterally. Musculoskeletal: No joint deformities, No  joint redness or warmth, no limitation of ROM in spin. Skin: No rashes.  Neuro: Alert, oriented X3, cranial nerves II-XII grossly intact, moves all extremities normally. Muscle strength 5/5 in all extremities, sensation to light touch intact. Brachial reflex 2+ bilaterally. Knee reflex 1+ bilaterally. Negative Babinski's sign. Normal finger to nose test. Psych: Patient is not psychotic, no suicidal or hemocidal ideation.  Labs on Admission: I have personally reviewed following labs and imaging studies  CBC: Recent Labs  Lab 07/23/20 1304 07/28/20 0657  WBC 6.1 9.6  HGB 15.6 13.5  HCT 42.8 36.5*  MCV 92.6 95.1  PLT 185 314   Basic Metabolic Panel: Recent Labs  Lab 07/23/20 1304 07/28/20 0657  NA 131* 133*  K 3.6 3.1*  CL 95* 97*  CO2 20* 24  GLUCOSE 95 106*  BUN <5* 6  CREATININE 0.54* 0.55*  CALCIUM 9.0 8.8*  MG  --  1.9   GFR: Estimated Creatinine Clearance: 90.8 mL/min (A) (by C-G formula based on SCr of 0.55 mg/dL  (L)). Liver Function Tests: Recent Labs  Lab 07/23/20 1304 07/28/20 0657  AST 205* 51*  ALT 117* 56*  ALKPHOS 59 48  BILITOT 0.9 1.3*  PROT 8.3* 6.9  ALBUMIN 4.7 4.0   Recent Labs  Lab 07/23/20 1304 07/28/20 0657  LIPASE 70* 54*   No results for input(s): AMMONIA in the last 168 hours. Coagulation Profile: No results for input(s): INR, PROTIME in the last 168 hours. Cardiac Enzymes: No results for input(s): CKTOTAL, CKMB, CKMBINDEX, TROPONINI in the last 168 hours. BNP (last 3 results) No results for input(s): PROBNP in the last 8760 hours. HbA1C: No results for input(s): HGBA1C in the last 72 hours. CBG: No results for input(s): GLUCAP in the last 168 hours. Lipid Profile: No results for input(s): CHOL, HDL, LDLCALC, TRIG, CHOLHDL, LDLDIRECT in the last 72 hours. Thyroid Function Tests: No results for input(s): TSH, T4TOTAL, FREET4, T3FREE, THYROIDAB in the last 72 hours. Anemia Panel: No results for input(s): VITAMINB12, FOLATE, FERRITIN, TIBC, IRON, RETICCTPCT in the last 72 hours. Urine analysis:    Component Value Date/Time   COLORURINE YELLOW (A) 07/23/2020 1304   APPEARANCEUR CLEAR (A) 07/23/2020 1304   LABSPEC 1.004 (L) 07/23/2020 1304   PHURINE 5.0 07/23/2020 1304   GLUCOSEU NEGATIVE 07/23/2020 1304   HGBUR MODERATE (A) 07/23/2020 1304   BILIRUBINUR NEGATIVE 07/23/2020 1304   KETONESUR 5 (A) 07/23/2020 1304   PROTEINUR 30 (A) 07/23/2020 1304   NITRITE NEGATIVE 07/23/2020 1304   LEUKOCYTESUR NEGATIVE 07/23/2020 1304   Sepsis Labs: @LABRCNTIP (procalcitonin:4,lacticidven:4) ) Recent Results (from the past 240 hour(s))  Resp Panel by RT-PCR (Flu A&B, Covid) Nasopharyngeal Swab     Status: None   Collection Time: 07/28/20  7:45 AM   Specimen: Nasopharyngeal Swab; Nasopharyngeal(NP) swabs in vial transport medium  Result Value Ref Range Status   SARS Coronavirus 2 by RT PCR NEGATIVE NEGATIVE Final    Comment: (NOTE) SARS-CoV-2 target nucleic acids are  NOT DETECTED.  The SARS-CoV-2 RNA is generally detectable in upper respiratory specimens during the acute phase of infection. The lowest concentration of SARS-CoV-2 viral copies this assay can detect is 138 copies/mL. A negative result does not preclude SARS-Cov-2 infection and should not be used as the sole basis for treatment or other patient management decisions. A negative result may occur with  improper specimen collection/handling, submission of specimen other than nasopharyngeal swab, presence of viral mutation(s) within the areas targeted by this assay, and inadequate number of viral  copies(<138 copies/mL). A negative result must be combined with clinical observations, patient history, and epidemiological information. The expected result is Negative.  Fact Sheet for Patients:  EntrepreneurPulse.com.au  Fact Sheet for Healthcare Providers:  IncredibleEmployment.be  This test is no t yet approved or cleared by the [place] FDA and  has been authorized for detection and/or diagnosis of SARS-CoV-2 by FDA under an Emergency Use Authorization (EUA). This EUA will remain  in effect (meaning this test can be used) for the duration of the COVID-19 declaration under Section 564(b)(1) of the Act, 21 U.S.C.section 360bbb-3(b)(1), unless the authorization is terminated  or revoked sooner.       Influenza A by PCR NEGATIVE NEGATIVE Final   Influenza B by PCR NEGATIVE NEGATIVE Final    Comment: (NOTE) The Xpert Xpress SARS-CoV-2/FLU/RSV plus assay is intended as an aid in the diagnosis of influenza from Nasopharyngeal swab specimens and should not be used as a sole basis for treatment. Nasal washings and aspirates are unacceptable for Xpert Xpress SARS-CoV-2/FLU/RSV testing.  Fact Sheet for Patients: EntrepreneurPulse.com.au  Fact Sheet for Healthcare Providers: IncredibleEmployment.be  This test is not  yet approved or cleared by the [place] FDA and has been authorized for detection and/or diagnosis of SARS-CoV-2 by FDA under an Emergency Use Authorization (EUA). This EUA will remain in effect (meaning this test can be used) for the duration of the COVID-19 declaration under Section 564(b)(1) of the Act, 21 U.S.C. section 360bbb-3(b)(1), unless the authorization is terminated or revoked.  Performed at [facility], [address] [place], [place] [address]      Radiological Exams on Admission: No results found.   EKG: I have personally reviewed.  Sinus rhythm, QTC 464, LAD, nonspecific T wave change  Assessment/Plan Principal Problem:   Angioedema Active Problems:   Essential hypertension   GERD (gastroesophageal reflux disease)   Alcohol abuse   Hypokalemia   Angioedema: Improving.  May be due to benazepril use.  No stridor on auscultation. -Placed on MedSurg bed for observation -will continue Solu-Medrol 40 mg tid -IV Benadryl 25 mg bid -IV pepcid 20 mg bid -prn EpiPen -IVF  Essential hypertension -IV hydralazine as needed -Discontinue benazepril -Continue amlodipine 10 mg daily  GERD (gastroesophageal reflux disease) -Protonix  Alcohol abuse and alcohol withdrawal: Patient is tremulous and tachycardia -CIWA protocol  Hypokalemia: Potassium 3.1, magnesium 1.9 -Repleted potassium    DVT ppx: SQ Lovenox Code Status: Full code Family Communication: not done, no family member is at bed side.    Disposition Plan:  Anticipate discharge back to previous environment Consults called:  none Admission status and Level of care: Med-Surg:    obs   Status is: Observation  The patient remains OBS appropriate and will d/c before 2 midnights.  Dispo: The patient is from: Home              Anticipated d/c is to: Home              Patient currently is not medically stable to d/c.   Difficult to place patient No           Date of Service  07/28/2020    [facility]   If 7PM-7AM, please contact night-coverage [email or web address] 07/28/2020, 1:53 PM

## 2020-07-28 NOTE — ED Triage Notes (Addendum)
Pt in w/lip swelling, progressing to tongue. States he woke up about 45 min ago and noticed the lip swelling. C/o some throat tightness and itchiness, sats 98% in triage. Feels the need to clear throat a lot. No new medications recently, NKA

## 2020-07-28 NOTE — ED Provider Notes (Signed)
Patient received in signout from Dr. Leonides Schanz patient presenting with angioedema of the lips also with signs suggestive of alcohol withdrawal.  States his last drink was 2 days ago does appear to be having mild withdrawal symptoms.  He feels that his labs are becoming less swollen.  No stridor.  No uvular edema.  He is protecting his airway.  Will place on CIWA protocol and continue to observe.  Patient reassessed.  His angioedema seems to be improving.  This point he believe he stable for admission to the hospital given his symptoms of withdrawal do think he would benefit from observation and additional treatment of acute alcohol withdrawal.  Will discuss with hospitalist for admission.   Merlyn Lot, MD 07/28/20 [phone]

## 2020-07-29 DIAGNOSIS — F1010 Alcohol abuse, uncomplicated: Secondary | ICD-10-CM | POA: Diagnosis not present

## 2020-07-29 DIAGNOSIS — I10 Essential (primary) hypertension: Secondary | ICD-10-CM

## 2020-07-29 DIAGNOSIS — K219 Gastro-esophageal reflux disease without esophagitis: Secondary | ICD-10-CM

## 2020-07-29 DIAGNOSIS — E876 Hypokalemia: Secondary | ICD-10-CM

## 2020-07-29 DIAGNOSIS — T783XXA Angioneurotic edema, initial encounter: Secondary | ICD-10-CM | POA: Diagnosis not present

## 2020-07-29 LAB — CBC
HCT: 34.4 % — ABNORMAL LOW (ref 39.0–52.0)
Hemoglobin: 12.4 g/dL — ABNORMAL LOW (ref 13.0–17.0)
MCH: 33.8 pg (ref 26.0–34.0)
MCHC: 36 g/dL (ref 30.0–36.0)
MCV: 93.7 fL (ref 80.0–100.0)
Platelets: 219 10*3/uL (ref 150–400)
RBC: 3.67 MIL/uL — ABNORMAL LOW (ref 4.22–5.81)
RDW: 13.5 % (ref 11.5–15.5)
WBC: 9.1 10*3/uL (ref 4.0–10.5)
nRBC: 0 % (ref 0.0–0.2)

## 2020-07-29 LAB — BASIC METABOLIC PANEL
Anion gap: 9 (ref 5–15)
BUN: 7 mg/dL (ref 6–20)
CO2: 26 mmol/L (ref 22–32)
Calcium: 8.4 mg/dL — ABNORMAL LOW (ref 8.9–10.3)
Chloride: 104 mmol/L (ref 98–111)
Creatinine, Ser: 0.5 mg/dL — ABNORMAL LOW (ref 0.61–1.24)
GFR, Estimated: >60 mL/min (ref >60)
Glucose, Bld: 118 mg/dL — ABNORMAL HIGH (ref 70–99)
Potassium: 2.4 mmol/L — CL (ref 3.5–5.1)
Sodium: 139 mmol/L (ref 135–145)

## 2020-07-29 MED ORDER — METHYLPREDNISOLONE SODIUM SUCC 40 MG IJ SOLR
40.0000 mg | Freq: Every day | INTRAMUSCULAR | Status: DC
  Administered 2020-07-30: 40 mg via INTRAVENOUS
  Filled 2020-07-29: qty 1

## 2020-07-29 MED ORDER — ADULT MULTIVITAMIN W/MINERALS CH
1.0000 | ORAL_TABLET | Freq: Every day | ORAL | Status: DC
  Administered 2020-07-29 – 2020-07-31 (×3): 1 via ORAL
  Filled 2020-07-29 (×3): qty 1

## 2020-07-29 MED ORDER — FOLIC ACID 1 MG PO TABS
1.0000 mg | ORAL_TABLET | Freq: Every day | ORAL | Status: DC
  Administered 2020-07-29 – 2020-07-31 (×3): 1 mg via ORAL
  Filled 2020-07-29 (×3): qty 1

## 2020-07-29 MED ORDER — POTASSIUM CHLORIDE CRYS ER 20 MEQ PO TBCR
40.0000 meq | EXTENDED_RELEASE_TABLET | Freq: Two times a day (BID) | ORAL | Status: DC
  Administered 2020-07-29 – 2020-07-30 (×3): 40 meq via ORAL
  Filled 2020-07-29 (×3): qty 2

## 2020-07-29 MED ORDER — POTASSIUM CHLORIDE 10 MEQ/100ML IV SOLN
10.0000 meq | INTRAVENOUS | Status: AC
  Administered 2020-07-29 (×4): 10 meq via INTRAVENOUS
  Filled 2020-07-29 (×4): qty 100

## 2020-07-29 MED ORDER — ENSURE ENLIVE PO LIQD
237.0000 mL | Freq: Two times a day (BID) | ORAL | Status: DC
  Administered 2020-07-30 – 2020-07-31 (×3): 237 mL via ORAL

## 2020-07-29 NOTE — Progress Notes (Signed)
Initial Nutrition Assessment  DOCUMENTATION CODES:   Not applicable  INTERVENTION:  Provide Ensure Enlive po BID, each supplement provides 350 kcal and 20 grams of protein.  Continue thiamine 100 mg po daily. Also provide MVI po daily and folic acid 1 mg po daily.  Monitor magnesium, potassium, and phosphorus daily for at least 3 days, MD to replete as needed, as pt is at risk for refeeding syndrome.  NUTRITION DIAGNOSIS:   Inadequate oral intake (PTA) related to decreased appetite as evidenced by per patient/family report.  GOAL:   Patient will meet greater than or equal to 90% of their needs  MONITOR:   PO intake,Supplement acceptance,Labs,Weight trends,I & O's  REASON FOR ASSESSMENT:   Malnutrition Screening Tool    ASSESSMENT:   60 year old male with PMHx of HTN, GERD, diverticulosis, iron deficiency anemia, gastric ulcer disease, alcohol abuse, COVID-19 infection, essential tremor admitted with angioedema.   Met with patient at bedside. He reports he had a decreased appetite for a while PTA. He reports it was related to several things including diverticulitis, alcohol intake, and most recently his angioedema. He reports his appetite is better now. He is eating 100% of his meals here. Discussed importance of adequate intake. Patient is amenable to drinking oral nutrition supplements to help meet calorie/protein needs.   Patient reports his UBW was 162 lbs. Per chart patient was 71.7 kg on 01/03/2020. RD obtained bed scale weight today of 65.6 kg (144.62 lbs). That is a weight loss of 6.1 kg (8.5% body weight) over the past 7 months, which is not significant for time frame.  Medications reviewed and include: Ativan, Solu-Medrol 40 mg daily IV, Protonix, thiamine 100 mg daily, famotidine.  Labs reviewed: Potassium 2.4, Creatinine 0.5.  Patient does not meet criteria for malnutrition at this time but is at risk for development of malnutrition.  NUTRITION - FOCUSED PHYSICAL  EXAM:  Flowsheet Row Most Recent Value  Orbital Region No depletion  Upper Arm Region No depletion  Thoracic and Lumbar Region No depletion  Buccal Region No depletion  Temple Region No depletion  Clavicle Bone Region No depletion  Clavicle and Acromion Bone Region No depletion  Scapular Bone Region No depletion  Dorsal Hand No depletion  Patellar Region Moderate depletion  Anterior Thigh Region Moderate depletion  Posterior Calf Region Moderate depletion  Edema (RD Assessment) None  Hair Reviewed  Eyes Reviewed  Mouth Reviewed  Skin Reviewed  Nails Reviewed     Diet Order:   Diet Order            Diet Heart Room service appropriate? Yes; Fluid consistency: Thin  Diet effective now                EDUCATION NEEDS:   No education needs have been identified at this time  Skin:  Skin Assessment: Reviewed RN Assessment  Last BM:  07/27/2020 per chart  Height:   Ht Readings from Last 1 Encounters:  07/23/20 5' 6"  (1.676 m)   Weight:   Wt Readings from Last 1 Encounters:  07/29/20 65.6 kg   BMI:  Body mass index is 23.34 kg/m.  Estimated Nutritional Needs:   Kcal:  1800-2000  Protein:  90-100 grams  Fluid:  1.8-2 L/day  Jacklynn Barnacle, MS, RD, LDN Pager number available on Amion

## 2020-07-29 NOTE — Progress Notes (Signed)
PROGRESS NOTE    Luis Porter  BOF:[id number] DOB: Jul 05, 1960 DOA: 07/28/2020 PCP: Olin Hauser, DO    Brief Narrative:  Luis Porter is a 60 y.o. male with medical history significant of hypertension, hyperlipidemia, GERD, gastric ulcer disease, diverticulitis, alcohol abuse, BPH, COVID-19 infection, essential tremor, who presents with tongue swelling., throat tightness and itchiness.   3/2- feeling better, minimal throat tightness. Scoring on CIWA protocal, 6. Given ativan    Consultants:     Procedures:   Antimicrobials:       Subjective: Feeling better. Minimal throat tightness, no sob  Objective: Vitals:   07/29/20 0106 07/29/20 0455 07/29/20 0808 07/29/20 1111  BP: 117/77 129/75 138/82 (!) 147/96  Pulse: 74 67 (!) 110 90  Resp: 16 16 16 18   Temp: 97.7 F (36.5 C) 97.7 F (36.5 C) 97.8 F (36.6 C) 97.8 F (36.6 C)  TempSrc:      SpO2: 98% 100% 97% 99%    Intake/Output Summary (Last 24 hours) at 07/29/2020 1202 Last data filed at 07/29/2020 0827 Gross per 24 hour  Intake 1740.07 ml  Output 2050 ml  Net -309.93 ml   There were no vitals filed for this visit.  Examination:  General exam: Appears calm and comfortable  Respiratory system: Clear to auscultation. Respiratory effort normal. Cardiovascular system: S1 & S2 heard, RRR. No JVD, murmurs, rubs, gallops or clicks.  Gastrointestinal system: Abdomen is nondistended, soft and nontender. No organomegaly or masses felt. Normal bowel sounds heard. Central nervous system: Alert and oriented. Some UE fine tremor Extremities: no edema Skin: warm, dry Psychiatry: Judgement and insight appear normal. Mood & affect appropriate.     Data Reviewed: I have personally reviewed following labs and imaging studies  CBC: Recent Labs  Lab 07/23/20 1304 07/28/20 0657 07/29/20 0621  WBC 6.1 9.6 9.1  HGB 15.6 13.5 12.4*  HCT 42.8 36.5* 34.4*  MCV 92.6 95.1 93.7  PLT 185 205 778    Basic Metabolic Panel: Recent Labs  Lab 07/23/20 1304 07/28/20 0657 07/29/20 0621  NA 131* 133* 139  K 3.6 3.1* 2.4*  CL 95* 97* 104  CO2 20* 24 26  GLUCOSE 95 106* 118*  BUN <5* 6 7  CREATININE 0.54* 0.55* 0.50*  CALCIUM 9.0 8.8* 8.4*  MG  --  1.9  --    GFR: Estimated Creatinine Clearance: 90.8 mL/min (A) (by C-G formula based on SCr of 0.5 mg/dL (L)). Liver Function Tests: Recent Labs  Lab 07/23/20 1304 07/28/20 0657  AST 205* 51*  ALT 117* 56*  ALKPHOS 59 48  BILITOT 0.9 1.3*  PROT 8.3* 6.9  ALBUMIN 4.7 4.0   Recent Labs  Lab 07/23/20 1304 07/28/20 0657  LIPASE 70* 54*   No results for input(s): AMMONIA in the last 168 hours. Coagulation Profile: No results for input(s): INR, PROTIME in the last 168 hours. Cardiac Enzymes: No results for input(s): CKTOTAL, CKMB, CKMBINDEX, TROPONINI in the last 168 hours. BNP (last 3 results) No results for input(s): PROBNP in the last 8760 hours. HbA1C: No results for input(s): HGBA1C in the last 72 hours. CBG: No results for input(s): GLUCAP in the last 168 hours. Lipid Profile: No results for input(s): CHOL, HDL, LDLCALC, TRIG, CHOLHDL, LDLDIRECT in the last 72 hours. Thyroid Function Tests: No results for input(s): TSH, T4TOTAL, FREET4, T3FREE, THYROIDAB in the last 72 hours. Anemia Panel: No results for input(s): VITAMINB12, FOLATE, FERRITIN, TIBC, IRON, RETICCTPCT in the last 72 hours. Sepsis Labs: No results  for input(s): PROCALCITON, LATICACIDVEN in the last 168 hours.  Recent Results (from the past 240 hour(s))  Resp Panel by RT-PCR (Flu A&B, Covid) Nasopharyngeal Swab     Status: None   Collection Time: 07/28/20  7:45 AM   Specimen: Nasopharyngeal Swab; Nasopharyngeal(NP) swabs in vial transport medium  Result Value Ref Range Status   SARS Coronavirus 2 by RT PCR NEGATIVE NEGATIVE Final    Comment: (NOTE) SARS-CoV-2 target nucleic acids are NOT DETECTED.  The SARS-CoV-2 RNA is generally detectable in  upper respiratory specimens during the acute phase of infection. The lowest concentration of SARS-CoV-2 viral copies this assay can detect is 138 copies/mL. A negative result does not preclude SARS-Cov-2 infection and should not be used as the sole basis for treatment or other patient management decisions. A negative result may occur with  improper specimen collection/handling, submission of specimen other than nasopharyngeal swab, presence of viral mutation(s) within the areas targeted by this assay, and inadequate number of viral copies(<138 copies/mL). A negative result must be combined with clinical observations, patient history, and epidemiological information. The expected result is Negative.  Fact Sheet for Patients:  EntrepreneurPulse.com.au  Fact Sheet for Healthcare Providers:  IncredibleEmployment.be  This test is no t yet approved or cleared by the [place] FDA and  has been authorized for detection and/or diagnosis of SARS-CoV-2 by FDA under an Emergency Use Authorization (EUA). This EUA will remain  in effect (meaning this test can be used) for the duration of the COVID-19 declaration under Section 564(b)(1) of the Act, 21 U.S.C.section 360bbb-3(b)(1), unless the authorization is terminated  or revoked sooner.       Influenza A by PCR NEGATIVE NEGATIVE Final   Influenza B by PCR NEGATIVE NEGATIVE Final    Comment: (NOTE) The Xpert Xpress SARS-CoV-2/FLU/RSV plus assay is intended as an aid in the diagnosis of influenza from Nasopharyngeal swab specimens and should not be used as a sole basis for treatment. Nasal washings and aspirates are unacceptable for Xpert Xpress SARS-CoV-2/FLU/RSV testing.  Fact Sheet for Patients: EntrepreneurPulse.com.au  Fact Sheet for Healthcare Providers: IncredibleEmployment.be  This test is not yet approved or cleared by the [place] FDA and has been  authorized for detection and/or diagnosis of SARS-CoV-2 by FDA under an Emergency Use Authorization (EUA). This EUA will remain in effect (meaning this test can be used) for the duration of the COVID-19 declaration under Section 564(b)(1) of the Act, 21 U.S.C. section 360bbb-3(b)(1), unless the authorization is terminated or revoked.  Performed at [facility], [address], [place], [place] [address]          Radiology Studies: No results found.      Scheduled Meds: . amLODipine  10 mg Oral Daily  . chlordiazePOXIDE  25 mg Oral TID  . diphenhydrAMINE  25 mg Intravenous BID  . enoxaparin (LOVENOX) injection  40 mg Subcutaneous Q24H  . LORazepam  0-4 mg Intravenous Q6H   Or  . LORazepam  0-4 mg Oral Q6H  . [START ON 07/30/2020] LORazepam  0-4 mg Intravenous Q12H   Or  . [START ON 07/30/2020] LORazepam  0-4 mg Oral Q12H  . methylPREDNISolone (SOLU-MEDROL) injection  40 mg Intravenous Q12H  . pantoprazole  40 mg Oral Daily  . potassium chloride  40 mEq Oral BID  . thiamine  100 mg Oral Daily   Or  . thiamine  100 mg Intravenous Daily   Continuous Infusions: . sodium chloride 100 mL/hr at 07/28/20 1125  .  famotidine (PEPCID) IV 20 mg (07/28/20 1757)  . potassium chloride 10 mEq (07/29/20 1159)    Assessment & Plan:   Principal Problem:   Angioedema Active Problems:   Essential hypertension   GERD (gastroesophageal reflux disease)   Alcohol abuse   Hypokalemia   Angioedema:   May be due to benazepril use.  No stridor on auscultation. -Improving.  Minimal symptoms currently.   We will decrease IV Solu-Medrol to daily  Continue IV Benadryl  Continue IV Pepcid  As needed epi  DC IV fluids   Essential hypertension Stable Continue amlodipine 10 mg daily BenzePrO discontinued for above reasons IV hydralazine   GERD (gastroesophageal reflux disease) Continue PPI   Alcohol abuse and alcohol withdrawal:  Currently patient is tremulous Scoring  6 on CIWA protocol We will continue to monitor   Hypokalemia: Potassium 2.4, magnesium 1.9 No reports of diarrhea Will replace with IV and p.o. potassium Check a.m. labs   DVT prophylaxis: Lovenox Code Status: Full Family Communication: None at bedside  Status is: Observation  The patient remains OBS appropriate and will d/c before 2 midnights.  Dispo: The patient is from: Home              Anticipated d/c is to: Home              Patient currently is not medically stable to d/c.   Difficult to place patient No  Patient experiencing alcohol withdrawal  And scoring on CIWA protocal , once stops scoring             LOS: 0 days   Time spent: 35 min with >50% on coc    Nolberto Hanlon, MD [facility] Pager [phone]  If 7PM-7AM, please contact night-coverage 07/29/2020, 12:02 PM

## 2020-07-30 ENCOUNTER — Telehealth: Payer: Self-pay

## 2020-07-30 DIAGNOSIS — F1010 Alcohol abuse, uncomplicated: Secondary | ICD-10-CM | POA: Diagnosis not present

## 2020-07-30 DIAGNOSIS — T783XXS Angioneurotic edema, sequela: Secondary | ICD-10-CM | POA: Diagnosis not present

## 2020-07-30 DIAGNOSIS — I10 Essential (primary) hypertension: Secondary | ICD-10-CM | POA: Diagnosis not present

## 2020-07-30 DIAGNOSIS — K219 Gastro-esophageal reflux disease without esophagitis: Secondary | ICD-10-CM | POA: Diagnosis not present

## 2020-07-30 DIAGNOSIS — T783XXA Angioneurotic edema, initial encounter: Secondary | ICD-10-CM | POA: Diagnosis not present

## 2020-07-30 LAB — POTASSIUM: Potassium: 3.4 mmol/L — ABNORMAL LOW (ref 3.5–5.1)

## 2020-07-30 MED ORDER — POTASSIUM CHLORIDE CRYS ER 20 MEQ PO TBCR
40.0000 meq | EXTENDED_RELEASE_TABLET | Freq: Once | ORAL | Status: AC
  Administered 2020-07-30: 40 meq via ORAL
  Filled 2020-07-30: qty 2

## 2020-07-30 MED ORDER — FAMOTIDINE 20 MG PO TABS: 20.0000 mg | ORAL_TABLET | Freq: Every day | ORAL | Status: DC

## 2020-07-30 NOTE — Progress Notes (Signed)
PROGRESS NOTE    Luis Porter  KDT:[id number] DOB: 02-Aug-1960 DOA: 07/28/2020 PCP: Olin Hauser, DO    Brief Narrative:  Luis Porter is a 60 y.o. male with medical history significant of hypertension, hyperlipidemia, GERD, gastric ulcer disease, diverticulitis, alcohol abuse, BPH, COVID-19 infection, essential tremor, who presents with tongue swelling., throat tightness and itchiness.   3/2- feeling better, minimal throat tightness. Scoring on CIWA protocal, 6. Given ativan  3/3-scored on ciwa protocal this am at 3. Overnight also.   Consultants:     Procedures:   Antimicrobials:       Subjective: Feels little tremors still. Some sore throat but able to eat.  Objective: Vitals:   07/30/20 0101 07/30/20 0521 07/30/20 0748 07/30/20 1114  BP: (!) 136/91 123/80 (!) 134/93 119/82  Pulse: 68 73 92 86  Resp: 18 14 18 17   Temp: (!) 97.5 F (36.4 C) (!) 97.5 F (36.4 C) 98.3 F (36.8 C) 97.8 F (36.6 C)  TempSrc: Oral Oral    SpO2: 96% 100% 100% 97%  Weight:        Intake/Output Summary (Last 24 hours) at 07/30/2020 1230 Last data filed at 07/30/2020 0129 Gross per 24 hour  Intake 1648.42 ml  Output 1000 ml  Net 648.42 ml   Filed Weights   07/29/20 1531  Weight: 65.6 kg    Examination: Call, comfortable CTA no wheeze rales rhonchi Regular S1-S2 no gallop Soft benign positive bowel sounds Mild upper extremity tremor No lower extremity edema Alert oriented x3 Mood and affect appropriate in current setting    Data Reviewed: I have personally reviewed following labs and imaging studies  CBC: Recent Labs  Lab 07/23/20 1304 07/28/20 0657 07/29/20 0621  WBC 6.1 9.6 9.1  HGB 15.6 13.5 12.4*  HCT 42.8 36.5* 34.4*  MCV 92.6 95.1 93.7  PLT 185 205 998   Basic Metabolic Panel: Recent Labs  Lab 07/23/20 1304 07/28/20 0657 07/29/20 0621 07/30/20 0718  NA 131* 133* 139  --   K 3.6 3.1* 2.4* 3.4*  CL 95* 97* 104  --   CO2  20* 24 26  --   GLUCOSE 95 106* 118*  --   BUN <5* 6 7  --   CREATININE 0.54* 0.55* 0.50*  --   CALCIUM 9.0 8.8* 8.4*  --   MG  --  1.9  --   --    GFR: Estimated Creatinine Clearance: 90.8 mL/min (A) (by C-G formula based on SCr of 0.5 mg/dL (L)). Liver Function Tests: Recent Labs  Lab 07/23/20 1304 07/28/20 0657  AST 205* 51*  ALT 117* 56*  ALKPHOS 59 48  BILITOT 0.9 1.3*  PROT 8.3* 6.9  ALBUMIN 4.7 4.0   Recent Labs  Lab 07/23/20 1304 07/28/20 0657  LIPASE 70* 54*   No results for input(s): AMMONIA in the last 168 hours. Coagulation Profile: No results for input(s): INR, PROTIME in the last 168 hours. Cardiac Enzymes: No results for input(s): CKTOTAL, CKMB, CKMBINDEX, TROPONINI in the last 168 hours. BNP (last 3 results) No results for input(s): PROBNP in the last 8760 hours. HbA1C: No results for input(s): HGBA1C in the last 72 hours. CBG: No results for input(s): GLUCAP in the last 168 hours. Lipid Profile: No results for input(s): CHOL, HDL, LDLCALC, TRIG, CHOLHDL, LDLDIRECT in the last 72 hours. Thyroid Function Tests: No results for input(s): TSH, T4TOTAL, FREET4, T3FREE, THYROIDAB in the last 72 hours. Anemia Panel: No results for input(s): VITAMINB12, FOLATE, FERRITIN,  TIBC, IRON, RETICCTPCT in the last 72 hours. Sepsis Labs: No results for input(s): PROCALCITON, LATICACIDVEN in the last 168 hours.  Recent Results (from the past 240 hour(s))  Resp Panel by RT-PCR (Flu A&B, Covid) Nasopharyngeal Swab     Status: None   Collection Time: 07/28/20  7:45 AM   Specimen: Nasopharyngeal Swab; Nasopharyngeal(NP) swabs in vial transport medium  Result Value Ref Range Status   SARS Coronavirus 2 by RT PCR NEGATIVE NEGATIVE Final    Comment: (NOTE) SARS-CoV-2 target nucleic acids are NOT DETECTED.  The SARS-CoV-2 RNA is generally detectable in upper respiratory specimens during the acute phase of infection. The lowest concentration of SARS-CoV-2 viral copies  this assay can detect is 138 copies/mL. A negative result does not preclude SARS-Cov-2 infection and should not be used as the sole basis for treatment or other patient management decisions. A negative result may occur with  improper specimen collection/handling, submission of specimen other than nasopharyngeal swab, presence of viral mutation(s) within the areas targeted by this assay, and inadequate number of viral copies(<138 copies/mL). A negative result must be combined with clinical observations, patient history, and epidemiological information. The expected result is Negative.  Fact Sheet for Patients:  EntrepreneurPulse.com.au  Fact Sheet for Healthcare Providers:  IncredibleEmployment.be  This test is no t yet approved or cleared by the [place] FDA and  has been authorized for detection and/or diagnosis of SARS-CoV-2 by FDA under an Emergency Use Authorization (EUA). This EUA will remain  in effect (meaning this test can be used) for the duration of the COVID-19 declaration under Section 564(b)(1) of the Act, 21 U.S.C.section 360bbb-3(b)(1), unless the authorization is terminated  or revoked sooner.       Influenza A by PCR NEGATIVE NEGATIVE Final   Influenza B by PCR NEGATIVE NEGATIVE Final    Comment: (NOTE) The Xpert Xpress SARS-CoV-2/FLU/RSV plus assay is intended as an aid in the diagnosis of influenza from Nasopharyngeal swab specimens and should not be used as a sole basis for treatment. Nasal washings and aspirates are unacceptable for Xpert Xpress SARS-CoV-2/FLU/RSV testing.  Fact Sheet for Patients: EntrepreneurPulse.com.au  Fact Sheet for Healthcare Providers: IncredibleEmployment.be  This test is not yet approved or cleared by the [place] FDA and has been authorized for detection and/or diagnosis of SARS-CoV-2 by FDA under an Emergency Use Authorization (EUA). This EUA  will remain in effect (meaning this test can be used) for the duration of the COVID-19 declaration under Section 564(b)(1) of the Act, 21 U.S.C. section 360bbb-3(b)(1), unless the authorization is terminated or revoked.  Performed at [facility], [address], [place], [place] [address]          Radiology Studies: No results found.      Scheduled Meds:  amLODipine  10 mg Oral Daily   chlordiazePOXIDE  25 mg Oral TID   diphenhydrAMINE  25 mg Intravenous BID   enoxaparin (LOVENOX) injection  40 mg Subcutaneous Q24H   feeding supplement  237 mL Oral BID BM   folic acid  1 mg Oral Daily   LORazepam  0-4 mg Intravenous Q12H   Or   LORazepam  0-4 mg Oral Q12H   methylPREDNISolone (SOLU-MEDROL) injection  40 mg Intravenous Daily   multivitamin with minerals  1 tablet Oral Daily   pantoprazole  40 mg Oral Daily   potassium chloride  40 mEq Oral BID   thiamine  100 mg Oral Daily   Or   thiamine  100 mg  Intravenous Daily   Continuous Infusions:  famotidine (PEPCID) IV 20 mg (07/30/20 1028)    Assessment & Plan:   Principal Problem:   Angioedema Active Problems:   Essential hypertension   GERD (gastroesophageal reflux disease)   Alcohol abuse   Hypokalemia   Angioedema:   May be due to benazepril use.  No stridor on auscultation. Improved Will d/c steroid, benadryl, Pepcid Epi prn    Essential hypertension Stable Continue amlodipine  ACE inhibitor discontinued for above reasons  IV hydralazine as needed   GERD (gastroesophageal reflux disease) Continue PPI   Alcohol abuse and alcohol withdrawal:  Still tremulous and scoring on CIWA protocol last night and this morning  We will continue to monitor      Hypokalemia:  2.4>>3.4 today No reports of diarrhea.    DVT prophylaxis: Lovenox Code Status: Full Family Communication: None at bedside  Status is: Observation  The patient remains OBS appropriate and will  d/c before 2 midnights.  Dispo: The patient is from: Home              Anticipated d/c is to: Home              Patient currently is not medically stable to d/c.   Difficult to place patient No  Expected dicharge in 1 day Patient experiencing alcohol withdrawal  And scoring on CIWA protocal , once stops scoring             LOS: 0 days   Time spent: 35 min with >50% on coc    Nolberto Hanlon, MD [facility] Pager [phone]  If 7PM-7AM, please contact night-coverage 07/30/2020, 12:30 PM

## 2020-07-30 NOTE — Telephone Encounter (Signed)
Copied from [place] [phone]. Topic: General - Other >> Jul 30, 2020 11:58 AM Tessa Lerner A wrote: Reason for CRM: Patient had to cancel appt for 07/31/20 due to still being in the hospital Patient would like to be contacted by a member of staff regarding a previously discussed referral to a GI specialist Patient would also like to provide additional information on personal health Please contact to advise

## 2020-07-30 NOTE — Telephone Encounter (Signed)
I attempted to contact the patient back. The patient is currently admitted in the hospital.

## 2020-07-31 ENCOUNTER — Inpatient Hospital Stay: Payer: Commercial Managed Care - PPO | Admitting: Family Medicine

## 2020-07-31 DIAGNOSIS — K219 Gastro-esophageal reflux disease without esophagitis: Secondary | ICD-10-CM | POA: Diagnosis not present

## 2020-07-31 DIAGNOSIS — T783XXA Angioneurotic edema, initial encounter: Secondary | ICD-10-CM | POA: Diagnosis not present

## 2020-07-31 DIAGNOSIS — F1010 Alcohol abuse, uncomplicated: Secondary | ICD-10-CM | POA: Diagnosis not present

## 2020-07-31 DIAGNOSIS — T783XXS Angioneurotic edema, sequela: Secondary | ICD-10-CM | POA: Diagnosis not present

## 2020-07-31 DIAGNOSIS — I10 Essential (primary) hypertension: Secondary | ICD-10-CM | POA: Diagnosis not present

## 2020-07-31 LAB — POTASSIUM: Potassium: 3.9 mmol/L (ref 3.5–5.1)

## 2020-07-31 MED ORDER — THIAMINE HCL 100 MG PO TABS: 100.0000 mg | ORAL_TABLET | Freq: Every day | ORAL | 0 refills | Status: AC

## 2020-07-31 MED ORDER — FOLIC ACID 1 MG PO TABS: 1.0000 mg | ORAL_TABLET | Freq: Every day | ORAL | 0 refills | Status: AC

## 2020-07-31 MED ORDER — POLYVINYL ALCOHOL 1.4 % OP SOLN
1.0000 [drp] | OPHTHALMIC | Status: DC | PRN
  Administered 2020-07-31: 1 [drp] via OPHTHALMIC
  Filled 2020-07-31: qty 15

## 2020-07-31 MED ORDER — AMLODIPINE BESYLATE 10 MG PO TABS: 10.0000 mg | ORAL_TABLET | Freq: Every day | ORAL | 0 refills | Status: DC

## 2020-07-31 NOTE — Plan of Care (Signed)
  Problem: Health Behavior/Discharge Planning: Goal: Ability to manage health-related needs will improve Outcome: Adequate for Discharge   Problem: Clinical Measurements: Goal: Ability to maintain clinical measurements within normal limits will improve Outcome: Adequate for Discharge Goal: Will remain free from infection Outcome: Adequate for Discharge Goal: Diagnostic test results will improve Outcome: Adequate for Discharge Goal: Respiratory complications will improve Outcome: Adequate for Discharge Goal: Cardiovascular complication will be avoided Outcome: Adequate for Discharge   Problem: Activity: Goal: Risk for activity intolerance will decrease Outcome: Adequate for Discharge   Problem: Nutrition: Goal: Adequate nutrition will be maintained Outcome: Adequate for Discharge   Problem: Coping: Goal: Level of anxiety will decrease Outcome: Adequate for Discharge   Problem: Safety: Goal: Ability to remain free from injury will improve Outcome: Adequate for Discharge   Problem: Education: Goal: Knowledge of disease or condition will improve Outcome: Adequate for Discharge Goal: Understanding of discharge needs will improve Outcome: Adequate for Discharge   Problem: Health Behavior/Discharge Planning: Goal: Ability to identify changes in lifestyle to reduce recurrence of condition will improve Outcome: Adequate for Discharge Goal: Identification of resources available to assist in meeting health care needs will improve Outcome: Adequate for Discharge   Problem: Physical Regulation: Goal: Complications related to the disease process, condition or treatment will be avoided or minimized Outcome: Adequate for Discharge   Problem: Safety: Goal: Ability to remain free from injury will improve Outcome: Adequate for Discharge

## 2020-07-31 NOTE — Progress Notes (Signed)
Pt discharged home, all instructions provided and questions answered.   BP 118/90 (BP Location: Left Arm)   Pulse (!) 105   Temp 97.9 F (36.6 C) (Oral)   Resp 16   Wt 65.6 kg Comment: bed scale  SpO2 97%   BMI 23.34 kg/m

## 2020-07-31 NOTE — Discharge Summary (Signed)
Luis Porter XIP:[id number] DOB: 30-Dec-1960 DOA: 07/28/2020  PCP: Olin Hauser, DO  Admit date: 07/28/2020 Discharge date: 07/31/2020  Admitted From: Home Disposition: Home  Recommendations for Outpatient Follow-up:  1. Follow up with PCP in 1 week 2. Please obtain BMP/CBC in one week      Discharge Condition:Stable CODE STATUS: Full Diet recommendation: Heart Healthy  Brief/Interim Summary: Per [facility]:ALPFXTK Luis Porter is a 60 y.o. male with medical history significant of hypertension, hyperlipidemia, GERD, gastric ulcer disease, diverticulitis, alcohol abuse, BPH, COVID-19 infection, essential tremor, who presents with tongue swelling.Of note, patient was taking amlodipine/benazepril for high blood pressure.  Patient was tremulous. Patient was treated with epi, 125 mg of Solu-Medrol, 20 mg Pepcid and 50 mg of Benadryl with significant improvement of lip swelling and other symptoms in ED. he was admitted to the hospitalist service.  He was placed on CIWA protocol.  His alcohol level was 56.  The following morning his symptoms of lip swelling throat tightness had improved.  However patient was going through alcohol withdrawal.  His electrolytes were repleted.  His trembling of his upper extremity improved.  He does have baseline fine tremors since age of 89 per patient. Covid negative   Angioedema: May be due to benazepril use.No stridor on auscultation. Improved Treated with steroid, benadryl, Pepcid     Essential hypertension Stable Continue amlodipine  ACE inhibitor discontinued for above reasons     GERD (gastroesophageal reflux disease) Continue PPI   Alcohol abuseand alcohol withdrawal:  Was placed on CIWA protocal and was scoring. This am scored low , now improved clinically Was counseled about alcohol cessation and he verbalizes an understanding     Hypokalemia:  Replaced and stable. Today K 3.9  Discharge Diagnoses:   Principal Problem:   Angioedema Active Problems:   Essential hypertension   GERD (gastroesophageal reflux disease)   Alcohol abuse   Hypokalemia    Discharge Instructions  Discharge Instructions    Diet - low sodium heart healthy   Complete by: As directed    Discharge instructions   Complete by: As directed    No alcohol F/u with pcp in one week Take the new blood pressure medication, not your old one   Increase activity slowly   Complete by: As directed      Allergies as of 07/31/2020      Reactions   Benazepril    Angioedema   Nsaids Other (See Comments)   Due to history of ulcers      Medication List    STOP taking these medications   amLODipine-benazepril 10-40 MG capsule Commonly known as: LOTREL   promethazine 25 MG tablet Commonly known as: PHENERGAN     TAKE these medications   albuterol 108 (90 Base) MCG/ACT inhaler Commonly known as: VENTOLIN HFA INHALE 1-2 PUFFS INTO THE LUNGS EVERY 4 (FOUR) HOURS AS NEEDED FOR WHEEZING OR SHORTNESS OF BREATH.   amLODipine 10 MG tablet Commonly known as: NORVASC Take 1 tablet (10 mg total) by mouth daily. Start taking on: August 01, 2020   b complex vitamins capsule Take 1 capsule by mouth daily.   fluticasone 50 MCG/ACT nasal spray Commonly known as: FLONASE PLACE 2 SPRAYS INTO BOTH NOSTRILS DAILY. USE FOR 4-6 WEEKS THEN STOP AND USE SEASONALLY OR AS NEEDED.   folic acid 1 MG tablet Commonly known as: FOLVITE Take 1 tablet (1 mg total) by mouth daily. Start taking on: August 01, 2020   meclizine 25 MG tablet Commonly known  as: ANTIVERT Take 1 tablet (25 mg total) by mouth 3 (three) times daily as needed for dizziness or nausea.   omeprazole 40 MG capsule Commonly known as: PRILOSEC TAKE 1 CAPSULE BY MOUTH TWICE A DAY   ondansetron 4 MG disintegrating tablet Commonly known as: ZOFRAN-ODT TAKE 1 TABLET BY MOUTH EVERY 8 HOURS AS NEEDED FOR NAUSEA AND VOMITING   thiamine 100 MG tablet Take 1 tablet  (100 mg total) by mouth daily. Start taking on: August 01, 2020   Vitamins/Minerals Tabs Take by mouth.       Follow-up Information    Olin Hauser, DO Follow up in 1 week(s).   Specialty: Family Medicine Contact information: [address] [place] [address] [phone]              Allergies  Allergen Reactions  . Benazepril     Angioedema  . Nsaids Other (See Comments)    Due to history of ulcers     Consultations:     Procedures/Studies: CT ABDOMEN PELVIS W CONTRAST  Result Date: 07/23/2020 CLINICAL DATA:  Diffuse abdominal pain, nausea and vomiting, and dizziness for 5 weeks. EXAM: CT ABDOMEN AND PELVIS WITH CONTRAST TECHNIQUE: Multidetector CT imaging of the abdomen and pelvis was performed using the standard protocol following bolus administration of intravenous contrast. CONTRAST:  148mL OMNIPAQUE IOHEXOL 300 MG/ML  SOLN COMPARISON:  09/19/2019 FINDINGS: Lower Chest: No acute findings. Hepatobiliary: Moderate to severe diffuse hepatic steatosis is noted. No hepatic masses identified. Gallbladder is unremarkable. No evidence of biliary ductal dilatation. Pancreas:  No mass or inflammatory changes. Spleen: Within normal limits in size and appearance. Adrenals/Urinary Tract: No masses identified. 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 No evidence of ureteral calculi or hydronephrosis. Unremarkable unopacified urinary bladder. Stomach/Bowel: Moderate hiatal hernia is seen. No evidence of obstruction, inflammatory process or abnormal fluid collections. Normal appendix visualized. Diverticulosis is seen mainly involving the sigmoid colon, however there is no evidence of diverticulitis. Vascular/Lymphatic: No pathologically enlarged lymph nodes. No abdominal aortic aneurysm. Aortic atherosclerotic calcification noted. Reproductive:  No mass or other significant abnormality. Other:  None. Musculoskeletal:  No suspicious bone lesions  identified. IMPRESSION: No acute findings within the abdomen or pelvis. Moderate hiatal hernia. Moderate to severe hepatic steatosis. Colonic diverticulosis. No radiographic evidence of diverticulitis. Aortic Atherosclerosis (ICD10-I70.0). Electronically Signed   By: Marlaine Hind M.D.   On: 07/23/2020 15:45       Subjective: Feels better. No n/v/dizziness. Wants to go home  Discharge Exam: Vitals:   07/31/20 0842 07/31/20 1140  BP: (!) 134/91 118/90  Pulse: 87 (!) 105  Resp: 16 16  Temp: 98.3 F (36.8 C) 97.9 F (36.6 C)  SpO2: 100% 97%   Vitals:   07/30/20 2357 07/31/20 0447 07/31/20 0842 07/31/20 1140  BP: 116/75 128/88 (!) 134/91 118/90  Pulse: 75 82 87 (!) 105  Resp: 18 18 16 16   Temp: 97.7 F (36.5 C) 97.8 F (36.6 C) 98.3 F (36.8 C) 97.9 F (36.6 C)  TempSrc: Oral  Oral Oral  SpO2: 93% 100% 100% 97%  Weight:        General: Pt is alert, awake, not in acute distress Cardiovascular: RRR, S1/S2 +, no rubs, no gallops Respiratory: CTA bilaterally, no wheezing, no rhonchi Abdominal: Soft, NT, ND, bowel sounds + Extremities: no edema, no cyanosis    The results of significant diagnostics from this hospitalization (including imaging, microbiology, ancillary and laboratory) are listed below for reference.  Microbiology: Recent Results (from the past 240 hour(s))  Resp Panel by RT-PCR (Flu A&B, Covid) Nasopharyngeal Swab     Status: None   Collection Time: 07/28/20  7:45 AM   Specimen: Nasopharyngeal Swab; Nasopharyngeal(NP) swabs in vial transport medium  Result Value Ref Range Status   SARS Coronavirus 2 by RT PCR NEGATIVE NEGATIVE Final    Comment: (NOTE) SARS-CoV-2 target nucleic acids are NOT DETECTED.  The SARS-CoV-2 RNA is generally detectable in upper respiratory specimens during the acute phase of infection. The lowest concentration of SARS-CoV-2 viral copies this assay can detect is 138 copies/mL. A negative result does not preclude  SARS-Cov-2 infection and should not be used as the sole basis for treatment or other patient management decisions. A negative result may occur with  improper specimen collection/handling, submission of specimen other than nasopharyngeal swab, presence of viral mutation(s) within the areas targeted by this assay, and inadequate number of viral copies(<138 copies/mL). A negative result must be combined with clinical observations, patient history, and epidemiological information. The expected result is Negative.  Fact Sheet for Patients:  EntrepreneurPulse.com.au  Fact Sheet for Healthcare Providers:  IncredibleEmployment.be  This test is no t yet approved or cleared by the [place] FDA and  has been authorized for detection and/or diagnosis of SARS-CoV-2 by FDA under an Emergency Use Authorization (EUA). This EUA will remain  in effect (meaning this test can be used) for the duration of the COVID-19 declaration under Section 564(b)(1) of the Act, 21 U.S.C.section 360bbb-3(b)(1), unless the authorization is terminated  or revoked sooner.       Influenza A by PCR NEGATIVE NEGATIVE Final   Influenza B by PCR NEGATIVE NEGATIVE Final    Comment: (NOTE) The Xpert Xpress SARS-CoV-2/FLU/RSV plus assay is intended as an aid in the diagnosis of influenza from Nasopharyngeal swab specimens and should not be used as a sole basis for treatment. Nasal washings and aspirates are unacceptable for Xpert Xpress SARS-CoV-2/FLU/RSV testing.  Fact Sheet for Patients: EntrepreneurPulse.com.au  Fact Sheet for Healthcare Providers: IncredibleEmployment.be  This test is not yet approved or cleared by the [place] FDA and has been authorized for detection and/or diagnosis of SARS-CoV-2 by FDA under an Emergency Use Authorization (EUA). This EUA will remain in effect (meaning this test can be used) for the duration of  the COVID-19 declaration under Section 564(b)(1) of the Act, 21 U.S.C. section 360bbb-3(b)(1), unless the authorization is terminated or revoked.  Performed at [facility], [address] [place], [place] [address]      Labs: BNP (last 3 results) No results for input(s): BNP in the last 8760 hours. Basic Metabolic Panel: Recent Labs  Lab 07/28/20 0657 07/29/20 0621 07/30/20 0718 07/31/20 0558  NA 133* 139  --   --   K 3.1* 2.4* 3.4* 3.9  CL 97* 104  --   --   CO2 24 26  --   --   GLUCOSE 106* 118*  --   --   BUN 6 7  --   --   CREATININE 0.55* 0.50*  --   --   CALCIUM 8.8* 8.4*  --   --   MG 1.9  --   --   --    Liver Function Tests: Recent Labs  Lab 07/28/20 0657  AST 51*  ALT 56*  ALKPHOS 48  BILITOT 1.3*  PROT 6.9  ALBUMIN 4.0   Recent Labs  Lab 07/28/20 0657  LIPASE 54*   No results  for input(s): AMMONIA in the last 168 hours. CBC: Recent Labs  Lab 07/28/20 0657 07/29/20 0621  WBC 9.6 9.1  HGB 13.5 12.4*  HCT 36.5* 34.4*  MCV 95.1 93.7  PLT 205 219   Cardiac Enzymes: No results for input(s): CKTOTAL, CKMB, CKMBINDEX, TROPONINI in the last 168 hours. BNP: Invalid input(s): POCBNP CBG: No results for input(s): GLUCAP in the last 168 hours. D-Dimer No results for input(s): DDIMER in the last 72 hours. Hgb A1c No results for input(s): HGBA1C in the last 72 hours. Lipid Profile No results for input(s): CHOL, HDL, LDLCALC, TRIG, CHOLHDL, LDLDIRECT in the last 72 hours. Thyroid function studies No results for input(s): TSH, T4TOTAL, T3FREE, THYROIDAB in the last 72 hours.  Invalid input(s): FREET3 Anemia work up No results for input(s): VITAMINB12, FOLATE, FERRITIN, TIBC, IRON, RETICCTPCT in the last 72 hours. Urinalysis    Component Value Date/Time   COLORURINE YELLOW (A) 07/23/2020 1304   APPEARANCEUR CLEAR (A) 07/23/2020 1304   LABSPEC 1.004 (L) 07/23/2020 1304   PHURINE 5.0 07/23/2020 1304   GLUCOSEU NEGATIVE 07/23/2020  1304   HGBUR MODERATE (A) 07/23/2020 1304   BILIRUBINUR NEGATIVE 07/23/2020 1304   KETONESUR 5 (A) 07/23/2020 1304   PROTEINUR 30 (A) 07/23/2020 1304   NITRITE NEGATIVE 07/23/2020 1304   LEUKOCYTESUR NEGATIVE 07/23/2020 1304   Sepsis Labs Invalid input(s): PROCALCITONIN,  WBC,  LACTICIDVEN Microbiology Recent Results (from the past 240 hour(s))  Resp Panel by RT-PCR (Flu A&B, Covid) Nasopharyngeal Swab     Status: None   Collection Time: 07/28/20  7:45 AM   Specimen: Nasopharyngeal Swab; Nasopharyngeal(NP) swabs in vial transport medium  Result Value Ref Range Status   SARS Coronavirus 2 by RT PCR NEGATIVE NEGATIVE Final    Comment: (NOTE) SARS-CoV-2 target nucleic acids are NOT DETECTED.  The SARS-CoV-2 RNA is generally detectable in upper respiratory specimens during the acute phase of infection. The lowest concentration of SARS-CoV-2 viral copies this assay can detect is 138 copies/mL. A negative result does not preclude SARS-Cov-2 infection and should not be used as the sole basis for treatment or other patient management decisions. A negative result may occur with  improper specimen collection/handling, submission of specimen other than nasopharyngeal swab, presence of viral mutation(s) within the areas targeted by this assay, and inadequate number of viral copies(<138 copies/mL). A negative result must be combined with clinical observations, patient history, and epidemiological information. The expected result is Negative.  Fact Sheet for Patients:  EntrepreneurPulse.com.au  Fact Sheet for Healthcare Providers:  IncredibleEmployment.be  This test is no t yet approved or cleared by the [place] FDA and  has been authorized for detection and/or diagnosis of SARS-CoV-2 by FDA under an Emergency Use Authorization (EUA). This EUA will remain  in effect (meaning this test can be used) for the duration of the COVID-19 declaration under  Section 564(b)(1) of the Act, 21 U.S.C.section 360bbb-3(b)(1), unless the authorization is terminated  or revoked sooner.       Influenza A by PCR NEGATIVE NEGATIVE Final   Influenza B by PCR NEGATIVE NEGATIVE Final    Comment: (NOTE) The Xpert Xpress SARS-CoV-2/FLU/RSV plus assay is intended as an aid in the diagnosis of influenza from Nasopharyngeal swab specimens and should not be used as a sole basis for treatment. Nasal washings and aspirates are unacceptable for Xpert Xpress SARS-CoV-2/FLU/RSV testing.  Fact Sheet for Patients: EntrepreneurPulse.com.au  Fact Sheet for Healthcare Providers: IncredibleEmployment.be  This test is not yet approved or cleared by the  [place] Architectural technologist and has been authorized for detection and/or diagnosis of SARS-CoV-2 by FDA under an Print production planner (EUA). This EUA will remain in effect (meaning this test can be used) for the duration of the COVID-19 declaration under Section 564(b)(1) of the Act, 21 U.S.C. section 360bbb-3(b)(1), unless the authorization is terminated or revoked.  Performed at [facility], [address], [place], [place] [address]      Time coordinating discharge: Over 30 minutes  SIGNED:   Nolberto Hanlon, MD  [facility] 07/31/2020, 1:01 PM Pager   If 7PM-7AM, please contact night-coverage [email or web address] Password [email or web address]

## 2020-08-04 ENCOUNTER — Encounter: Payer: Self-pay | Admitting: Gastroenterology

## 2020-08-04 ENCOUNTER — Encounter: Payer: Self-pay | Admitting: Family Medicine

## 2020-08-04 ENCOUNTER — Ambulatory Visit (INDEPENDENT_AMBULATORY_CARE_PROVIDER_SITE_OTHER): Payer: Commercial Managed Care - PPO | Admitting: Family Medicine

## 2020-08-04 ENCOUNTER — Other Ambulatory Visit: Payer: Self-pay

## 2020-08-04 VITALS — BP 125/74 | HR 91 | Temp 97.8°F | Ht 66.0 in | Wt 153.4 lb

## 2020-08-04 DIAGNOSIS — F1021 Alcohol dependence, in remission: Secondary | ICD-10-CM

## 2020-08-04 DIAGNOSIS — K2920 Alcoholic gastritis without bleeding: Secondary | ICD-10-CM | POA: Diagnosis not present

## 2020-08-04 DIAGNOSIS — R5383 Other fatigue: Secondary | ICD-10-CM

## 2020-08-04 DIAGNOSIS — K700 Alcoholic fatty liver: Secondary | ICD-10-CM | POA: Diagnosis not present

## 2020-08-04 DIAGNOSIS — R112 Nausea with vomiting, unspecified: Secondary | ICD-10-CM

## 2020-08-04 DIAGNOSIS — K909 Intestinal malabsorption, unspecified: Secondary | ICD-10-CM | POA: Diagnosis not present

## 2020-08-04 DIAGNOSIS — D649 Anemia, unspecified: Secondary | ICD-10-CM

## 2020-08-04 NOTE — Patient Instructions (Addendum)
Thank you for coming to the office today.  [facility]) [place], [place] [address] Phone: [phone]  Call them to schedule follow-up apt to review Alcohol Gastritis / Liver disease and future plans, likely need future blood testing and imaging for liver.  Blood work today to follow-up from hospital.  Keep on the current path.  FMLA paperwork continuous leave 06/12/20 through TBD - need to know when GI apt is and when to return to work.  Intermittent 3 episodes per 1 month, up to 1-3 days per episode  Please schedule a Follow-up Appointment to: Return in about 3 months (around 11/04/2020) for 3 month follow-up Alcohol cessation / GI updates.  If you have any other questions or concerns, please feel free to call the office or send a message through [facility]. You may also schedule an earlier appointment if necessary.  Additionally, you may be receiving a survey about your experience at our office within a few days to 1 week by e-mail or mail. We value your feedback.  Nobie Putnam, DO [place]

## 2020-08-04 NOTE — Progress Notes (Signed)
Subjective:    Patient ID: Luis Porter, male    DOB: 09-Dec-1960, 60 y.o.   MRN: [id number]  Luis Porter is a 60 y.o. male presenting on 08/04/2020 for Nausea (Follow up - Pt says no more nausea and his appetite is back. ), Dizziness (Follow up- As of now no more dizziness), and Shaking (Pt states his shaking has improved./Pt would like to go over his labs results from the hospital since he states he does not understand the results or if he needs to be concern about them.)   HPI   HOSPITAL FOLLOW-UP VISIT  Hospital/Location: [facility] Date of Admission: 07/28/20 Date of Discharge: 07/31/20 Transitions of care telephone call: Not completed  Reason for Admission: Angioedema, swelling Primary (+Secondary) Diagnosis: Angioedema  Other conditions  Alcoholic gastritis / Alcohol Fatty Liver Dizziness / nausea vomiting / Weakness Vertigo History of GERD / PUD / Diverticulitis   FOLLOW-UP - Hospital H&P and Discharge Summary have been reviewed - Patient presents today about 4 days after recent hospitalization. Brief summary of recent course, patient had symptoms of sudden onset lip facial swelling, hospitalized, treated with discontinued ACEi Lisinopril (he had been on it for 20+ years prior), he was given epi and steroids, and histamine blockade. BP medication changed to only Amlodipine now instead of Benazepril.  - Today reports overall has done well after discharge. Symptoms of angioedema has resolved  Overall has clinically improved recently after quit alcohol, after medical detox in hospital.   Due to Check labs today per hospital f/u plan  Chronic recurrent viral syndromes it seems since Fall 2021, has had recent Flu like illness and negative COVID test. Last virtual 06/18/20. Given zofran PRN some relief still feels weak and symptoms when stands up and gets up in AM with spinning or dizziness causing some nausea  Previously followed by [facility] last 0/7371 Dx  Alcoholic Gastritis  He is going to schedule to return to GI, soon. For follow-up  Imaging CT from 06/2020 showed moderate to severe hepatic steatosis  Not dehydrated, drinking Ensures, vitamins, water, gatorade.   Depression screen [facility] 2/9 09/30/2019 11/20/2018 11/08/2016  Decreased Interest 0 0 0  Down, Depressed, Hopeless 0 0 0  PHQ - 2 Score 0 0 0  Altered sleeping - - 0  Tired, decreased energy - - 1  Change in appetite - - 0  Feeling bad or failure about yourself  - - 0  Trouble concentrating - - 0  Moving slowly or fidgety/restless - - 0  Suicidal thoughts - - 0  PHQ-9 Score - - 1    Social History   Tobacco Use  . Smoking status: Former Smoker    Packs/day: 2.00    Years: 25.00    Pack years: 50.00    Types: Cigarettes    Quit date: 05/31/1987    Years since quitting: 33.2  . Smokeless tobacco: Never Used  Vaping Use  . Vaping Use: Never used  Substance Use Topics  . Alcohol use: Not Currently    Alcohol/week: 8.0 standard drinks    Types: 8 Cans of beer per week    Comment: former usage  . Drug use: No    Review of Systems Per HPI unless specifically indicated above     Objective:    BP 125/74 (BP Location: Right Arm, Patient Position: Sitting, Cuff Size: Normal)   Pulse 91   Temp 97.8 F (36.6 C) (Temporal)   Ht 5\' 6"  (1.676 m)  Wt 153 lb 6.4 oz (69.6 kg)   SpO2 98%   BMI 24.76 kg/m   Wt Readings from Last 3 Encounters:  08/04/20 153 lb 6.4 oz (69.6 kg)  07/29/20 144 lb 10 oz (65.6 kg)  07/23/20 150 lb (68 kg)    Physical Exam Vitals and nursing note reviewed.  Constitutional:      General: He is not in acute distress.    Appearance: He is well-developed and well-nourished. He is not diaphoretic.     Comments: Well-appearing, comfortable, cooperative  HENT:     Head: Normocephalic and atraumatic.     Mouth/Throat:     Mouth: Oropharynx is clear and moist.  Eyes:     General:        Right eye: No discharge.        Left eye: No  discharge.     Conjunctiva/sclera: Conjunctivae normal.  Neck:     Thyroid: No thyromegaly.  Cardiovascular:     Rate and Rhythm: Normal rate and regular rhythm.     Pulses: Intact distal pulses.     Heart sounds: Normal heart sounds. No murmur heard.   Pulmonary:     Effort: Pulmonary effort is normal. No respiratory distress.     Breath sounds: Normal breath sounds. No wheezing or rales.  Musculoskeletal:        General: No edema. Normal range of motion.     Cervical back: Normal range of motion and neck supple.  Lymphadenopathy:     Cervical: No cervical adenopathy.  Skin:    General: Skin is warm and dry.     Findings: No erythema or rash.  Neurological:     Mental Status: He is alert and oriented to person, place, and time.  Psychiatric:        Mood and Affect: Mood and affect normal.        Behavior: Behavior normal.     Comments: Well groomed, good eye contact, normal speech and thoughts       I have personally reviewed the radiology report from 07/23/20 CT Abdomen..  CT ABDOMEN PELVIS W CONTRASTPerformed 07/23/2020 Final result  Study Result CLINICAL DATA: Diffuse abdominal pain, nausea and vomiting, and dizziness for 5 weeks.  EXAM: CT ABDOMEN AND PELVIS WITH CONTRAST  TECHNIQUE: Multidetector CT imaging of the abdomen and pelvis was performed using the standard protocol following bolus administration of intravenous contrast.  CONTRAST: 164mL OMNIPAQUE IOHEXOL 300 MG/ML SOLN  COMPARISON: 09/19/2019  FINDINGS: Lower Chest: No acute findings.  Hepatobiliary: Moderate to severe diffuse hepatic steatosis is noted. No hepatic masses identified. Gallbladder is unremarkable. No evidence of biliary ductal dilatation.  Pancreas: No mass or inflammatory changes.  Spleen: Within normal limits in size and appearance.  Adrenals/Urinary Tract: No masses identified. 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 No evidence of ureteral  calculi or hydronephrosis. Unremarkable unopacified urinary bladder.  Stomach/Bowel: Moderate hiatal hernia is seen. No evidence of obstruction, inflammatory process or abnormal fluid collections. Normal appendix visualized. Diverticulosis is seen mainly involving the sigmoid colon, however there is no evidence of diverticulitis.  Vascular/Lymphatic: No pathologically enlarged lymph nodes. No abdominal aortic aneurysm. Aortic atherosclerotic calcification noted.  Reproductive: No mass or other significant abnormality.  Other: None.  Musculoskeletal: No suspicious bone lesions identified.  IMPRESSION: No acute findings within the abdomen or pelvis.  Moderate hiatal hernia.  Moderate to severe hepatic steatosis.  Colonic diverticulosis. No radiographic evidence of diverticulitis.  Aortic Atherosclerosis (ICD10-I70.0).   Electronically  Signed By: Marlaine Hind M.D. On: 07/23/2020 15:45   Results for orders placed or performed during the hospital encounter of 07/28/20  Resp Panel by RT-PCR (Flu A&B, Covid) Nasopharyngeal Swab   Specimen: Nasopharyngeal Swab; Nasopharyngeal(NP) swabs in vial transport medium  Result Value Ref Range   SARS Coronavirus 2 by RT PCR NEGATIVE NEGATIVE   Influenza A by PCR NEGATIVE NEGATIVE   Influenza B by PCR NEGATIVE NEGATIVE  CBC  Result Value Ref Range   WBC 9.6 4.0 - 10.5 K/uL   RBC 3.84 (L) 4.22 - 5.81 MIL/uL   Hemoglobin 13.5 13.0 - 17.0 g/dL   HCT 36.5 (L) 39.0 - 52.0 %   MCV 95.1 80.0 - 100.0 fL   MCH 35.2 (H) 26.0 - 34.0 pg   MCHC 37.0 (H) 30.0 - 36.0 g/dL   RDW 13.3 11.5 - 15.5 %   Platelets 205 150 - 400 K/uL   nRBC 0.0 0.0 - 0.2 %  Comprehensive metabolic panel  Result Value Ref Range   Sodium 133 (L) 135 - 145 mmol/L   Potassium 3.1 (L) 3.5 - 5.1 mmol/L   Chloride 97 (L) 98 - 111 mmol/L   CO2 24 22 - 32 mmol/L   Glucose, Bld 106 (H) 70 - 99 mg/dL   BUN 6 6 - 20 mg/dL   Creatinine, Ser 0.55 (L) 0.61 - 1.24 mg/dL    Calcium 8.8 (L) 8.9 - 10.3 mg/dL   Total Protein 6.9 6.5 - 8.1 g/dL   Albumin 4.0 3.5 - 5.0 g/dL   AST 51 (H) 15 - 41 U/L   ALT 56 (H) 0 - 44 U/L   Alkaline Phosphatase 48 38 - 126 U/L   Total Bilirubin 1.3 (H) 0.3 - 1.2 mg/dL   GFR, Estimated >60 >60 mL/min   Anion gap 12 5 - 15  Lipase, blood  Result Value Ref Range   Lipase 54 (H) 11 - 51 U/L  Magnesium  Result Value Ref Range   Magnesium 1.9 1.7 - 2.4 mg/dL  Basic metabolic panel  Result Value Ref Range   Sodium 139 135 - 145 mmol/L   Potassium 2.4 (LL) 3.5 - 5.1 mmol/L   Chloride 104 98 - 111 mmol/L   CO2 26 22 - 32 mmol/L   Glucose, Bld 118 (H) 70 - 99 mg/dL   BUN 7 6 - 20 mg/dL   Creatinine, Ser 0.50 (L) 0.61 - 1.24 mg/dL   Calcium 8.4 (L) 8.9 - 10.3 mg/dL   GFR, Estimated >60 >60 mL/min   Anion gap 9 5 - 15  CBC  Result Value Ref Range   WBC 9.1 4.0 - 10.5 K/uL   RBC 3.67 (L) 4.22 - 5.81 MIL/uL   Hemoglobin 12.4 (L) 13.0 - 17.0 g/dL   HCT 34.4 (L) 39.0 - 52.0 %   MCV 93.7 80.0 - 100.0 fL   MCH 33.8 26.0 - 34.0 pg   MCHC 36.0 30.0 - 36.0 g/dL   RDW 13.5 11.5 - 15.5 %   Platelets 219 150 - 400 K/uL   nRBC 0.0 0.0 - 0.2 %  Potassium  Result Value Ref Range   Potassium 3.4 (L) 3.5 - 5.1 mmol/L  Potassium  Result Value Ref Range   Potassium 3.9 3.5 - 5.1 mmol/L      Assessment & Plan:   Problem List Items Addressed This Visit    Chronic alcoholic gastritis without hemorrhage - Primary   Relevant Orders   CBC with Differential/Platelet  COMPLETE METABOLIC PANEL WITH GFR   Alcohol induced fatty liver   Relevant Orders   COMPLETE METABOLIC PANEL WITH GFR   Alcohol dependence in early full remission ([place])    Other Visit Diagnoses    Intestinal malabsorption, unspecified type       Fatigue, unspecified type       Normocytic anemia       Relevant Orders   CBC with Differential/Platelet   Non-intractable vomiting with nausea, unspecified vomiting type          Angioedema - resolved off ACEi  Benazepril Bp is currently controlled of this ACEi component. Monitor BP on Amlodipine  Alcoholic Gastritis / Steatosis Alcohol dependence early remission Constellation viral GI symptoms and secondary concerns with nausea vomiting, vertigo, weakness  FMLA - out of work Fri 06/12/20 - initial for COVID testing protocol then remained out due to alcohol and Gi related complications.  Angioedema hospitalization 07/28/20 also kept him out temporarily.  Now he is dramatically improved on medical detox in hospital and is alcohol free  Upcoming apt with [facility] on Monday 08/10/20 for evaluation and assistance on when can return to work.  Previously at peak 18 beers daily, avg 8-10, now he is alcohol free.  Anticipate FMLA - Continuous 06/12/20 - TBD March vs April 2022 - Will need Treatment apt 2 visits per 3 months - Intermittent for potential flare up - approx 3 episodes per 1 month, up to 1-3 days per episode  Will await update from GI for further completion of FMLA, sent letter to patient today for review can fax to [facility] if approved.   No orders of the defined types were placed in this encounter.     Follow up plan: Return in about 3 months (around 11/04/2020) for 3 month follow-up Alcohol cessation / GI updates.   Nobie Putnam, [facility] 08/04/2020, 10:29 AM

## 2020-08-05 DIAGNOSIS — N529 Male erectile dysfunction, unspecified: Secondary | ICD-10-CM

## 2020-08-05 LAB — COMPLETE METABOLIC PANEL WITH GFR
AG Ratio: 1.7 (calc) (ref 1.0–2.5)
ALT: 29 U/L (ref 9–46)
AST: 23 U/L (ref 10–35)
Albumin: 4.3 g/dL (ref 3.6–5.1)
Alkaline phosphatase (APISO): 53 U/L (ref 35–144)
BUN: 8 mg/dL (ref 7–25)
CO2: 25 mmol/L (ref 20–32)
Calcium: 10 mg/dL (ref 8.6–10.3)
Chloride: 102 mmol/L (ref 98–110)
Creat: 0.81 mg/dL (ref 0.70–1.33)
GFR, Est African American: 114 mL/min/{1.73_m2} (ref >=60)
GFR, Est Non African American: 98 mL/min/{1.73_m2} (ref >=60)
Globulin: 2.6 g/dL (calc) (ref 1.9–3.7)
Glucose, Bld: 82 mg/dL (ref 65–99)
Potassium: 3.9 mmol/L (ref 3.5–5.3)
Sodium: 139 mmol/L (ref 135–146)
Total Bilirubin: 0.3 mg/dL (ref 0.2–1.2)
Total Protein: 6.9 g/dL (ref 6.1–8.1)

## 2020-08-05 LAB — CBC WITH DIFFERENTIAL/PLATELET
Absolute Monocytes: 1269 cells/uL — ABNORMAL HIGH (ref 200–950)
Basophils Absolute: 189 cells/uL (ref 0–200)
Basophils Relative: 2.1 %
Eosinophils Absolute: 288 cells/uL (ref 15–500)
Eosinophils Relative: 3.2 %
HCT: 40.7 % (ref 38.5–50.0)
Hemoglobin: 14.2 g/dL (ref 13.2–17.1)
Lymphs Abs: 1764 cells/uL (ref 850–3900)
MCH: 34.3 pg — ABNORMAL HIGH (ref 27.0–33.0)
MCHC: 34.9 g/dL (ref 32.0–36.0)
MCV: 98.3 fL (ref 80.0–100.0)
MPV: 9.6 fL (ref 7.5–12.5)
Monocytes Relative: 14.1 %
Neutro Abs: 5490 cells/uL (ref 1500–7800)
Neutrophils Relative %: 61 %
Platelets: 551 10*3/uL — ABNORMAL HIGH (ref 140–400)
RBC: 4.14 10*6/uL — ABNORMAL LOW (ref 4.20–5.80)
RDW: 13.9 % (ref 11.0–15.0)
Total Lymphocyte: 19.6 %
WBC: 9 10*3/uL (ref 3.8–10.8)

## 2020-08-06 MED ORDER — SILDENAFIL CITRATE 20 MG PO TABS
ORAL_TABLET | ORAL | 1 refills | Status: DC
Start: 2020-08-06 — End: 2021-01-21

## 2020-08-06 NOTE — Addendum Note (Signed)
Addended by: Olin Hauser on: 08/06/2020 08:08 AM   Modules accepted: Orders

## 2020-08-10 ENCOUNTER — Ambulatory Visit (INDEPENDENT_AMBULATORY_CARE_PROVIDER_SITE_OTHER): Payer: Commercial Managed Care - PPO | Admitting: Gastroenterology

## 2020-08-10 ENCOUNTER — Encounter: Payer: Self-pay | Admitting: Gastroenterology

## 2020-08-10 ENCOUNTER — Other Ambulatory Visit: Payer: Self-pay

## 2020-08-10 VITALS — BP 149/89 | HR 109 | Ht 66.0 in | Wt 153.0 lb

## 2020-08-10 DIAGNOSIS — K700 Alcoholic fatty liver: Secondary | ICD-10-CM | POA: Diagnosis not present

## 2020-08-10 NOTE — Progress Notes (Signed)
Luis Darby, MD [address]  [place]  [place], Luis Porter [address]  Main: [phone]  Fax: [phone]    Gastroenterology Consultation  Referring Provider:     Nobie Porter * Primary Care Physician:  Luis Hauser, DO Primary Gastroenterologist:  Dr. Sherri Porter Reason for Consultation:   Hospital follow-up        HPI:   Luis Porter is a 60 y.o. male referred by Dr. Parks Porter, Luis Doughty, DO  for consultation & management of approximately 1 month history of lower abdominal pain, worse in left lower quadrant associated with 2-3 soft bowel movements daily.  Patient had recent episode of acute sigmoid diverticulitis, admitted to [facility] end of April 2021, CT confirmed presence of mild diverticulitis of the proximal sigmoid colon, uncomplicated.  This was treated with Augmentin.  Patient had bloody diarrhea during this event.  Since that admission, he did not have any episodes of rectal bleeding.  Patient reports having another event in 05/2019.  He acknowledges drinking alcohol, 6-7 beers per day for several years.  He does report fatigue, lower abdominal cramps as well as epigastric pain.  He denies melena.  He is currently on omeprazole 40 mg twice daily He also reports weight loss, unable to go to work due to bowel urgency and abdominal cramps postprandial  Follow-up visit 11/12/2019 Patient reports doing significantly better since last visit.  He underwent upper endoscopy as well as colonoscopy.  Upper endoscopy was unremarkable, colonoscopy revealed benign polyps, random colon biopsies were normal.  He has cut down on alcohol to 5 beers per day from 12 pack/day.  He is currently on omeprazole 40 mg 2 times daily with significantly relieves his dyspepsia symptoms.  He is also followed by Dr. Tasia Porter, hematologist for IV iron.  He received first infusion on 6/9.  He reports feeling better but has headaches, palpitations, jitteriness, likely alcohol  withdrawal symptoms.  He denies any black stools.  His anemia is improving.  He is requesting return to work letter  Follow-up visit 01/23/2020 Patient has been doing well from anemia standpoint.  His iron deficiency anemia has resolved.  Today, he is concerned about 2 weeks history of nonbloody diarrhea associated with significant flatulence that is interfering with his work.  He is asking for work excuse for 1 to 2 weeks until his diarrhea is resolved.  He denies eating out.  He denies abdominal pain.  He does report intermittent nausea and vomiting, particularly end of the day after he goes home.  He denies fever, chills.  He does report having bowel movements 4-5 times daily.  He denies nocturnal diarrhea. He denies consuming carbonated beverages He did not start taking gabapentin  Follow-up visit 08/10/2020 Patient was recently discharged from [facility] after he was treated for angioedema, attributed to ACE inhibitor.  Patient was in alcohol withdrawal at that time.  He was treated for it and was discharged on 07/31/2020.  Since discharge, patient has not been drinking alcohol.  He reports feeling well.  He denies any GI symptoms other than intermittent constipation.  He reports that his appetite is good and has been eating well.  He was found to have fatty liver based on the CT scan.  His LFTs have been normal.  He would like to go back to work, he works at [employer].  He does not smoke.  He does not have any concerns today.  NSAIDs: None  Antiplts/Anticoagulants/Anti thrombotics: None  GI Procedures:  EGD  and colonoscopy 10/31/2019 EGD was unremarkable  Colonoscopy - Preparation of the colon was fair. - Hemorrhoids found on perianal exam. - The examined portion of the ileum was normal. - Normal mucosa in the entire examined colon. Biopsied. - Two 3 to 5 mm polyps in the sigmoid colon and in the descending colon, removed with a cold snare. Resected and retrieved. - Severe diverticulosis in  the sigmoid colon. There was no evidence of diverticular bleeding. - Non-bleeding external hemorrhoids.  DIAGNOSIS:  A. COLON, RANDOM; BIOPSIES:  - BENIGN COLONIC MUCOSA, NEGATIVE FOR ACTIVE MUCOSAL COLITIS,  LYMPHOCYTIC/MICROSCOPIC COLITIS, GRANULOMA AND DYSPLASIA.   B. COLON POLYP, DESCENDING; BIOPSY:  - TUBULAR ADENOMA AND FRAGMENTS OF BENIGN COLONIC MUCOSA.  - NEGATIVE FOR HIGH-GRADE DYSPLASIA AND MALIGNANCY.   C. COLON POLYP, SIGMOID, BIOPSY:  - TUBULAR ADENOMA AND FRAGMENTS OF BENIGN COLONIC MUCOSA.  - NEGATIVE FOR HIGH-GRADE DYSPLASIA AND MALIGNANCY.   EGD and colonoscopy 12/30/2016 by Dr. Vicente Porter DIAGNOSIS:  A. STOMACH POLYP; COLD BIOPSY:  - HYPERPLASTIC GASTRIC POLYP.  - NEGATIVE FOR H. PYLORI, DYSPLASIA, AND MALIGNANCY.   B. GEJ; COLD BIOPSY:  - REFLUX GASTROESOPHAGITIS.  - NEGATIVE FOR DYSPLASIA AND MALIGNANCY.   C. COLON POLYP, CECUM; COLD SNARE:  - TUBULAR ADENOMA.  - NEGATIVE FOR HIGH GRADE DYSPLASIA AND MALIGNANCY.   D. COLON POLYP, TRANSVERSE; COLD SNARE:  - TUBULAR ADENOMA.  - NEGATIVE FOR HIGH GRADE DYSPLASIA AND MALIGNANCY.   E. COLON POLYP 3, SIGMOID; COLD SNARE:  - TUBULAR ADENOMA (4 FRAGMENTS).  - NEGATIVE FOR HIGH GRADE DYSPLASIA AND MALIGNANCY.   Past Medical History:  Diagnosis Date  . Diverticulosis   . Gastric ulcer   . GERD (gastroesophageal reflux disease)   . History of hiatal hernia   . Hypertension   . Iron deficiency anemia 10/18/2019  . 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 BACK SURGERY    . COLONOSCOPY WITH PROPOFOL N/A 12/30/2016   Procedure: COLONOSCOPY WITH PROPOFOL;  Surgeon: [name], MD;  Location: [facility] ENDOSCOPY;  Service: Endoscopy;  Laterality: N/A;  . COLONOSCOPY WITH PROPOFOL N/A 10/31/2019   Procedure: COLONOSCOPY WITH PROPOFOL;  Surgeon: Luis Landsman, MD;  Location[facility] ENDOSCOPY;  Service: Gastroenterology;   Laterality: N/A;  . ESOPHAGOGASTRODUODENOSCOPY (EGD) WITH PROPOFOL N/A 12/30/2016   Procedure: ESOPHAGOGASTRODUODENOSCOPY (EGD) WITH PROPOFOL;  Surgeon: [name], MD;  Location: [facility] ENDOSCOPY;  Service: Endoscopy;  Laterality: N/A;  . ESOPHAGOGASTRODUODENOSCOPY (EGD) WITH PROPOFOL N/A 10/31/2019   Procedure: ESOPHAGOGASTRODUODENOSCOPY (EGD) WITH PROPOFOL;  Surgeon: Luis Landsman, MD;  Location: [facility] ENDOSCOPY;  Service: Gastroenterology;  Laterality: N/A;  . HERNIA REPAIR  0814   Umbilical Hernia Repair  . LUMBAR LAMINECTOMY/DECOMPRESSION MICRODISCECTOMY Right 05/18/2016   Procedure: right L4-5 microdiscectomy;  Surgeon: Blanche East, MD;  Location: [facility] ORS;  Service: Neurosurgery;  Laterality: Right;  . TONSILLECTOMY    . UMBILICAL HERNIA REPAIR N/A 06/29/2018   Procedure: LAPAROSCOPIC REPAIR OF RECURRENT UMBILICAL HERNIA WITH MESH;  Surgeon: Vickie Epley, MD;  Location: [facility] ORS;  Service: General;  Laterality: N/A;     Current Outpatient Medications:  .  albuterol (VENTOLIN HFA) 108 (90 Base) MCG/ACT inhaler, INHALE 1-2 PUFFS INTO THE LUNGS EVERY 4 (FOUR) HOURS AS NEEDED FOR WHEEZING OR SHORTNESS OF BREATH., Disp: 6.7 each, Rfl: 1 .  amLODipine (NORVASC) 10 MG tablet, Take 1 tablet (10 mg total) by mouth daily., Disp: 30 tablet, Rfl: 0 .  b complex vitamins capsule, Take 1 capsule by mouth daily., Disp: , Rfl:  .  fluticasone (FLONASE) 50 MCG/ACT nasal spray, PLACE 2 SPRAYS INTO BOTH NOSTRILS DAILY. USE FOR 4-6 WEEKS THEN STOP AND USE SEASONALLY OR AS NEEDED., Disp: 48 mL, Rfl: 1 .  folic acid (FOLVITE) 1 MG tablet, Take 1 tablet (1 mg total) by mouth daily., Disp: 30 tablet, Rfl: 0 .  meclizine (ANTIVERT) 25 MG tablet, Take 1 tablet (25 mg total) by mouth 3 (three) times daily as needed for dizziness or nausea., Disp: 60 tablet, Rfl: 1 .  omeprazole (PRILOSEC) 40 MG capsule, TAKE 1 CAPSULE BY MOUTH TWICE A DAY, Disp: 180 capsule, Rfl: 1 .  ondansetron (ZOFRAN-ODT) 4 MG  disintegrating tablet, TAKE 1 TABLET BY MOUTH EVERY 8 HOURS AS NEEDED FOR NAUSEA AND VOMITING, Disp: 30 tablet, Rfl: 0 .  sildenafil (REVATIO) 20 MG tablet, Take 1-5 pills about 30 min prior to sex. Start with 1 and increase as needed., Disp: 90 tablet, Rfl: 1 .  thiamine 100 MG tablet, Take 1 tablet (100 mg total) by mouth daily., Disp: 30 tablet, Rfl: 0 .  Vitamins/Minerals TABS, Take by mouth., Disp: , Rfl:   Family History  Problem Relation Age of Onset  . Hypertension Mother   . Cancer Mother        Ovarian  . Cervical cancer Mother   . Hypertension Father   . Cancer Father        tongue  . Throat cancer Father   . Hypertension Brother   . Prostate cancer Neg Hx   . Breast cancer Neg Hx   . Colon cancer Neg Hx   . Heart attack Neg Hx   . Stroke Neg Hx      Social History   Tobacco Use  . Smoking status: Former Smoker    Packs/day: 2.00    Years: 25.00    Pack years: 50.00    Types: Cigarettes    Quit date: 05/31/1987    Years since quitting: 33.2  . Smokeless tobacco: Never Used  Vaping Use  . Vaping Use: Never used  Substance Use Topics  . Alcohol use: Not Currently    Alcohol/week: 8.0 standard drinks    Types: 8 Cans of beer per week    Comment: former usage  . Drug use: No    Allergies as of 08/10/2020 - Review Complete 08/10/2020  Allergen Reaction Noted  . Benazepril  07/28/2020  . Nsaids Other (See Comments) 05/09/2016    Review of Systems:    All systems reviewed and negative except where noted in HPI.   Physical Exam:  BP (!) 149/89 (BP Location: Left Arm, Patient Position: Sitting, Cuff Size: Normal)   Pulse (!) 109   Ht 5\' 6"  (1.676 m)   Wt 153 lb (69.4 kg)   BMI 24.69 kg/m  No LMP for male patient.  General:   Alert, moderately built, moderately nourished, pleasant and cooperative in NAD Head:  Normocephalic and atraumatic. Eyes:  Sclera clear, no icterus.   Conjunctiva pink. Ears:  Normal auditory acuity. Nose:  No deformity,  discharge, or lesions. Mouth:  No deformity or lesions,oropharynx pink & moist. Neck:  Supple; no masses or thyromegaly. Lungs:  Respirations even and unlabored.  Clear throughout to auscultation.   No wheezes, crackles, or rhonchi. No acute distress. Heart:  Regular rate and rhythm; no murmurs, clicks, rubs, or gallops. Abdomen:  Normal bowel sounds. Soft, non-tender and mildly distended, tympanic without masses,  hepatosplenomegaly or hernias noted.  No guarding or rebound tenderness.   Rectal: Not performed Msk:  Symmetrical without gross deformities. Good, equal movement & strength bilaterally. Pulses:  Normal pulses noted. Extremities:  No clubbing or edema.  No cyanosis. Neurologic:  Alert and oriented x3;  grossly normal neurologically. Skin:  Intact without significant lesions or rashes. No jaundice. Psych:  Alert and cooperative. Normal mood and affect.  Imaging Studies: Reviewed  Assessment and Plan:   Luis Porter is a 60 y.o. male with history of alcohol abuse, no evidence of chronic liver disease or cirrhosis is seen for follow-up of 2 weeks history of nonbloody diarrhea with flatulence.  He had history of recurrent acute sigmoid diverticulitis: Uncomplicated, Treated with antibiotics in the past Stool studies were negative for infectious etiology, colonoscopy with TI evaluation and colon biopsies were unremarkable.  Follow-up of fatty liver  Epigastric pain: Resolved Likely alcoholic gastritis, no evidence of peptic ulcer disease Continue omeprazole 40 mg twice daily before meals Abstinence from alcohol H. pylori IgG negative  Iron deficiency anemia, currently resolved Patient responded to parenteral iron therapy EGD and colonoscopy are unremarkable H. pylori IgG and celiac serologies unremarkable  Alcohol dependence Patient does not want to try gabapentin as he did not tolerate it well in the past.  If he develops any withdrawal symptoms, we can try Librium as  outpatient.  I have strongly advised patient to contact my office even when he starts noticing mild symptoms of alcohol withdrawal Patient can go back to work from GI standpoint  Follow up in 4 months   Luis Darby, MD

## 2020-08-11 ENCOUNTER — Telehealth: Payer: Self-pay

## 2020-08-11 NOTE — Telephone Encounter (Signed)
Patient called to clarify that he would like his return back to work to be on Thursday, 08/13/20. If there are any other questions, please call patient at [phone]

## 2020-08-11 NOTE — Telephone Encounter (Signed)
Let me know if he can clarify the original start out of work date and what date he should return to work.  GI note on 08/10/20 that he should be able to return at any point from GI stand point.  Let me know and I can complete his FMLA paperwork and if needs a new work note.  Luis Porter, [facility] 08/11/2020, 12:01 PM

## 2020-08-11 NOTE — Telephone Encounter (Signed)
Work note written.  Completed FMLA continuous and intermittent  To be scanned to chart and faxed.  Nobie Putnam, [facility] 08/11/2020, 6:15 PM

## 2020-08-11 NOTE — Telephone Encounter (Signed)
Copied from [place] [phone]. Topic: General - Other >> Aug 11, 2020 10:13 AM Yvette Rack wrote: Reason for CRM: Pt requests a back to work note which includes the start date from February until present that includes his hospital visits. Cb# [id number]

## 2020-08-11 NOTE — Telephone Encounter (Signed)
I spoke with the patient and he stated that GI is great with him going back to work without restrictions. He mentioned that he would like to start back this Thursday 3/17 with the time frame mentioning the beginning date that was in January through now.

## 2020-08-12 NOTE — Telephone Encounter (Signed)
I sent a mychart note to notify the patient that Dr. Raliegh Ip completed the [facility] paperwork.

## 2020-08-21 ENCOUNTER — Other Ambulatory Visit: Payer: Self-pay | Admitting: Gastroenterology

## 2020-08-21 ENCOUNTER — Encounter: Payer: Self-pay | Admitting: Gastroenterology

## 2020-08-21 DIAGNOSIS — F1020 Alcohol dependence, uncomplicated: Secondary | ICD-10-CM

## 2020-08-21 DIAGNOSIS — F10930 Alcohol use, unspecified with withdrawal, uncomplicated: Secondary | ICD-10-CM

## 2020-08-21 DIAGNOSIS — F10230 Alcohol dependence with withdrawal, uncomplicated: Secondary | ICD-10-CM

## 2020-08-21 DIAGNOSIS — I10 Essential (primary) hypertension: Secondary | ICD-10-CM

## 2020-08-21 MED ORDER — CHLORDIAZEPOXIDE HCL 25 MG PO CAPS: ORAL_CAPSULE | ORAL | 0 refills | Status: DC

## 2020-08-21 NOTE — Telephone Encounter (Signed)
Received forwarded MyChart message from patient's GI specialist Dr Marius Ditch regarding request for me as patient's PCP to order the following - for his alcohol withdrawal/detox  Librium 50mg  every 6-12 hours as needed day 1 25mg  every 6 hours on day 2 25mg  twice daily on day 3 25mg  at bedtime on day 4  New rx sent to pharmacy.  Nobie Putnam, [facility] 08/21/2020, 6:11 PM

## 2020-08-24 MED ORDER — NALTREXONE HCL 50 MG PO TABS: 50.0000 mg | ORAL_TABLET | Freq: Every day | ORAL | 0 refills | Status: DC

## 2020-08-24 NOTE — Addendum Note (Signed)
Addended by: Olin Hauser on: 08/24/2020 05:22 PM   Modules accepted: Orders

## 2020-09-01 ENCOUNTER — Telehealth: Payer: Self-pay

## 2020-09-01 MED ORDER — AMLODIPINE BESYLATE 10 MG PO TABS: 10.0000 mg | ORAL_TABLET | Freq: Every day | ORAL | 1 refills | Status: DC

## 2020-09-01 NOTE — Telephone Encounter (Signed)
Copied from [place] [id number]. Topic: Quick Communication - Rx Refill/Question >> Sep 01, 2020 12:02 PM Loma [place] wrote: Medication:amLODipine (NORVASC) 10 MG tablet 30 tablet 0 08/01/2020 08/31/2020  Sig - Route: Take 1 tablet (10 mg total) by mouth daily. - Oral   Note this was not prescribe by Dr Raliegh Ip, but  Date: 07/31/2020 Department: [place] (1A) Ordering/Authorizing: Nolberto Hanlon, MD   Pt states that Dr Raliegh Ip normally prescribes for him not on list This was an exception.  Has the patient contacted their pharmacy? Yes (Agent: If no, request that the patient contact the pharmacy for the refill.) (Agent: If yes, when and what did the pharmacy advise?) Call Dr  Preferred Pharmacy (with phone number or street name): [id number] [place], [address] [place] [address] Phone: [phone] Fax: [phone] Hours: Not open 24 hours    Agent: Please be advised that RX refills may take up to 3 business days. We ask that you follow-up with your pharmacy.

## 2020-09-01 NOTE — Addendum Note (Signed)
Addended by: Olin Hauser on: 09/01/2020 12:31 PM   Modules accepted: Orders

## 2020-09-02 NOTE — Telephone Encounter (Signed)
Pt is reported allergic to benzonatate, pharmacy has the wrong Rx he says. Advised of what is receipt confirmed on file, patient says he will contact pharmacy again

## 2020-11-11 ENCOUNTER — Encounter: Payer: Self-pay | Admitting: Family Medicine

## 2020-11-11 ENCOUNTER — Telehealth (INDEPENDENT_AMBULATORY_CARE_PROVIDER_SITE_OTHER): Payer: Commercial Managed Care - PPO | Admitting: Family Medicine

## 2020-11-11 VITALS — Ht 66.0 in | Wt 160.0 lb

## 2020-11-11 DIAGNOSIS — K449 Diaphragmatic hernia without obstruction or gangrene: Secondary | ICD-10-CM

## 2020-11-11 DIAGNOSIS — K2950 Unspecified chronic gastritis without bleeding: Secondary | ICD-10-CM | POA: Diagnosis not present

## 2020-11-11 DIAGNOSIS — K219 Gastro-esophageal reflux disease without esophagitis: Secondary | ICD-10-CM | POA: Diagnosis not present

## 2020-11-11 NOTE — Progress Notes (Signed)
Virtual Visit via Telephone The purpose of this virtual visit is to provide medical care while limiting exposure to the novel coronavirus (COVID19) for both patient and office staff.  Consent was obtained for phone visit:  Yes.   Answered questions that patient had about telehealth interaction:  Yes.   I discussed the limitations, risks, security and privacy concerns of performing an evaluation and management service by telephone. I also discussed with the patient that there may be a patient responsible charge related to this service. The patient expressed understanding and agreed to proceed.  Patient Location: Home Provider Location: [facility] (Office)  Participants in virtual visit: - Patient: Luis Porter - CMA: Orinda Kenner, CMA - Provider: Dr Parks Ranger  ---------------------------------------------------------------------- Chief Complaint  Patient presents with   Sore Throat    S: Reviewed CMA documentation. 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 The aluminium fumes were causing him symptoms of sore throat and itchy throat and difficulty breathing at times.    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   Previously repaired x 1   Social History   Tobacco Use   Smoking status: Former     Packs/day: 2.00    Years: 25.00    Pack years: 50.00    Types: Cigarettes    Quit date: 05/31/1987    Years since quitting: 33.4   Smokeless tobacco: Never  Vaping Use   Vaping Use: Never used  Substance Use Topics   Alcohol use: Not Currently    Alcohol/week: 8.0 standard drinks    Types: 8 Cans of beer per week    Comment: former usage   Drug use: No    Current Outpatient Medications:    albuterol (VENTOLIN HFA) 108 (90 Base) MCG/ACT inhaler, INHALE 1-2 PUFFS INTO THE LUNGS EVERY 4 (FOUR) HOURS AS NEEDED FOR WHEEZING OR SHORTNESS OF BREATH., Disp: 6.7 each, Rfl: 1   amLODipine (NORVASC) 10 MG tablet, Take 1 tablet (10 mg total) by mouth daily., Disp: 90 tablet, Rfl: 1   b complex vitamins capsule, Take 1 capsule by mouth daily., Disp: , Rfl:    chlordiazePOXIDE (LIBRIUM) 25 MG capsule, For alcohol withdrawal - Take 2 capsules = 50mg  every 6-12 hours as needed day 1, 25mg  on every 6 hours on day 2, 25mg  twice daily on day 3, 25mg  at bedtime on day 4., Disp: 15 capsule, Rfl: 0   fluticasone (FLONASE) 50 MCG/ACT nasal spray, PLACE 2 SPRAYS INTO BOTH NOSTRILS DAILY. USE FOR 4-6 WEEKS THEN STOP AND USE SEASONALLY OR AS NEEDED., Disp: 48 mL, Rfl: 1   meclizine (ANTIVERT) 25 MG tablet, Take 1 tablet (25 mg total) by mouth 3 (three) times daily as needed for dizziness or nausea., Disp: 60 tablet, Rfl: 1   naltrexone (DEPADE) 50 MG tablet, Take 1 tablet (50 mg total) by  mouth daily. Increase to 100mg  daily after 1-2 weeks if needed, Disp: 60 tablet, Rfl: 0   omeprazole (PRILOSEC) 40 MG capsule, TAKE 1 CAPSULE BY MOUTH TWICE A DAY, Disp: 180 capsule, Rfl: 1   ondansetron (ZOFRAN-ODT) 4 MG disintegrating tablet, TAKE 1 TABLET BY MOUTH EVERY 8 HOURS AS NEEDED FOR NAUSEA AND VOMITING, Disp: 30 tablet, Rfl: 0   sildenafil (REVATIO) 20 MG tablet, Take 1-5 pills about 30 min prior to sex. Start with 1 and increase as needed., Disp: 90 tablet, Rfl: 1   Vitamins/Minerals TABS, Take by mouth., Disp: , Rfl:    Depression screen [facility] 2/9 09/30/2019 11/20/2018 11/08/2016  Decreased Interest 0 0 0  Down, Depressed, Hopeless 0 0 0  PHQ - 2 Score 0 0 0  Altered sleeping - - 0  Tired, decreased energy - - 1  Change in appetite - - 0  Feeling bad or failure about yourself  - - 0  Trouble concentrating - - 0  Moving slowly or fidgety/restless - - 0  Suicidal thoughts - - 0  PHQ-9 Score - - 1    No flowsheet data found.  -------------------------------------------------------------------------- O: No physical exam performed due to remote telephone encounter.  Lab results reviewed.  No results found for this or any previous visit (from the past 2160 hour(s)).  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 Follow-up as needed  No orders of the defined types were placed in this encounter.   Follow-up: - Return as needed  Patient verbalizes understanding with the above medical recommendations including the limitation of remote medical advice.  Specific follow-up and call-back criteria were given for patient to follow-up or seek medical care more urgently if needed.   - Time spent in direct consultation with patient on phone: 7 minutes   Nobie Putnam, [facility] 11/11/2020, 9:43 AM

## 2020-11-11 NOTE — Patient Instructions (Signed)
° °  Please schedule a Follow-up Appointment to: No follow-ups on file. ° °If you have any other questions or concerns, please feel free to call the office or send a message through MyChart. You may also schedule an earlier appointment if necessary. ° °Additionally, you may be receiving a survey about your experience at our office within a few days to 1 week by e-mail or mail. We value your feedback. ° °Kendarious Gudino, DO °[facility] °

## 2020-12-20 ENCOUNTER — Other Ambulatory Visit: Payer: Self-pay | Admitting: Family Medicine

## 2020-12-20 DIAGNOSIS — K219 Gastro-esophageal reflux disease without esophagitis: Secondary | ICD-10-CM

## 2020-12-20 NOTE — Telephone Encounter (Signed)
Requested Prescriptions  Pending Prescriptions Disp Refills  . omeprazole (PRILOSEC) 40 MG capsule [Pharmacy Med Name: OMEPRAZOLE DR 40 MG CAPSULE] 60 capsule 1    Sig: TAKE 1 CAPSULE BY MOUTH TWICE A DAY     Gastroenterology: Proton Pump Inhibitors Passed - 12/20/2020  9:17 AM      Passed - Valid encounter within last 12 months    Recent Outpatient Visits          1 month ago Hiatal hernia with gastroesophageal reflux   [place], DO   4 months ago Chronic alcoholic gastritis without hemorrhage   [place], DO   5 months ago Non-intractable vomiting with nausea, unspecified vomiting type   [place], DO   5 months ago No-show for appointment   [employer], DO   5 months ago [place], [place], DO      Future Appointments            In 3 weeks [place], Tally Due, MD [place]

## 2020-12-22 ENCOUNTER — Other Ambulatory Visit: Payer: Self-pay

## 2020-12-22 ENCOUNTER — Encounter: Payer: Self-pay | Admitting: Family Medicine

## 2020-12-22 ENCOUNTER — Telehealth (INDEPENDENT_AMBULATORY_CARE_PROVIDER_SITE_OTHER): Payer: Commercial Managed Care - PPO | Admitting: Family Medicine

## 2020-12-22 VITALS — Ht 66.0 in | Wt 160.0 lb

## 2020-12-22 DIAGNOSIS — K5792 Diverticulitis of intestine, part unspecified, without perforation or abscess without bleeding: Secondary | ICD-10-CM | POA: Diagnosis not present

## 2020-12-22 DIAGNOSIS — K219 Gastro-esophageal reflux disease without esophagitis: Secondary | ICD-10-CM | POA: Diagnosis not present

## 2020-12-22 DIAGNOSIS — R110 Nausea: Secondary | ICD-10-CM

## 2020-12-22 DIAGNOSIS — K2950 Unspecified chronic gastritis without bleeding: Secondary | ICD-10-CM

## 2020-12-22 DIAGNOSIS — R112 Nausea with vomiting, unspecified: Secondary | ICD-10-CM | POA: Diagnosis not present

## 2020-12-22 DIAGNOSIS — K449 Diaphragmatic hernia without obstruction or gangrene: Secondary | ICD-10-CM

## 2020-12-22 DIAGNOSIS — R197 Diarrhea, unspecified: Secondary | ICD-10-CM

## 2020-12-22 MED ORDER — ONDANSETRON 4 MG PO TBDP: 4.0000 mg | ORAL_TABLET | Freq: Three times a day (TID) | ORAL | 2 refills | Status: DC | PRN

## 2020-12-22 MED ORDER — DICYCLOMINE HCL 10 MG PO CAPS: 10.0000 mg | ORAL_CAPSULE | Freq: Three times a day (TID) | ORAL | 2 refills | Status: DC

## 2020-12-22 MED ORDER — CIPROFLOXACIN HCL 500 MG PO TABS: 500.0000 mg | ORAL_TABLET | Freq: Two times a day (BID) | ORAL | 0 refills | Status: DC

## 2020-12-22 NOTE — Progress Notes (Signed)
Virtual Visit via Telephone The purpose of this virtual visit is to provide medical care while limiting exposure to the novel coronavirus (COVID19) for both patient and office staff.  Consent was obtained for phone visit:  Yes.   Answered questions that patient had about telehealth interaction:  Yes.   I discussed the limitations, risks, security and privacy concerns of performing an evaluation and management service by telephone. I also discussed with the patient that there may be a patient responsible charge related to this service. The patient expressed understanding and agreed to proceed.  Patient Location: Home Provider Location: [facility] (Office)  Participants in virtual visit: - Patient: Luis Porter - CMA: Orinda Kenner, CMA - Provider: Dr Parks Ranger  ---------------------------------------------------------------------- Chief Complaint  Patient presents with   gastritis   Diarrhea    S: Reviewed CMA documentation. I have called patient and gathered additional HPI as follows:  Alcoholic Gastritis, Diarrhea Diverulicitis, acute on chronic  GERD, Hiatal Hernia  Previous history managed these problems with medications, GI consultation, and alcohol cessation for period of time, initially back in January 2022 through March 2022, and had been on coverage for intermittent FMLA through June 2022.  Now his FMLA has run out, and he has had worse flare up of symptoms now recently and wishes to resume FMLA again for his GI symptoms preventing him from working.  He has upcoming apt with Dr Marius Ditch [facility] in 3 weeks on 01/12/21  Reports that symptoms started Monday 12/21/20 with abdominal pain LLQ, diarrhea, nausea, vomiting.  COVID19 Test Monday 12/21/20 negative.  Taking Omeprazole '40mg'$  BID  He is unable to work due to diarrhea, required to have frequent bathroom trips and difficulty with prolonged standing, walking, limited balance.  His HR requested to  resume FMLA.  Admits bloating symptoms.  He was unsuccessful at alcohol cessation. Tried medical detox but unsuccessful. He still drinks alcohol regularly.   Denies any known or suspected exposure to person with or possibly with COVID19.  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   Previously repaired x 1   Social History   Tobacco Use   Smoking status: Former    Packs/day: 2.00    Years: 25.00    Pack years: 50.00    Types: Cigarettes    Quit date: 05/31/1987    Years since quitting: 33.5   Smokeless tobacco: Never  Vaping Use   Vaping Use: Never used  Substance Use Topics   Alcohol use: Not Currently    Alcohol/week: 8.0 standard drinks    Types: 8 Cans of beer per week    Comment: former usage   Drug use: No    Current Outpatient Medications:    albuterol (VENTOLIN HFA) 108 (90 Base) MCG/ACT inhaler, INHALE 1-2 PUFFS INTO THE LUNGS EVERY 4 (FOUR) HOURS AS NEEDED FOR WHEEZING OR SHORTNESS OF BREATH., Disp: 6.7 each, Rfl: 1   amLODipine (NORVASC) 10 MG tablet, Take 1 tablet (10 mg total) by mouth daily., Disp: 90 tablet, Rfl: 1   b complex vitamins capsule, Take 1 capsule by mouth daily., Disp: , Rfl:    ciprofloxacin (CIPRO) 500 MG tablet, Take 1  tablet (500 mg total) by mouth 2 (two) times daily. For 7 days, Disp: 14 tablet, Rfl: 0   dicyclomine (BENTYL) 10 MG capsule, Take 1 capsule (10 mg total) by mouth 4 (four) times daily -  before meals and at bedtime., Disp: 30 capsule, Rfl: 2   fluticasone (FLONASE) 50 MCG/ACT nasal spray, PLACE 2 SPRAYS INTO BOTH NOSTRILS DAILY. USE FOR 4-6 WEEKS THEN STOP AND USE SEASONALLY OR AS NEEDED., Disp: 48 mL, Rfl: 1    meclizine (ANTIVERT) 25 MG tablet, Take 1 tablet (25 mg total) by mouth 3 (three) times daily as needed for dizziness or nausea., Disp: 60 tablet, Rfl: 1   naltrexone (DEPADE) 50 MG tablet, Take 1 tablet (50 mg total) by mouth daily. Increase to '100mg'$  daily after 1-2 weeks if needed, Disp: 60 tablet, Rfl: 0   omeprazole (PRILOSEC) 40 MG capsule, TAKE 1 CAPSULE BY MOUTH TWICE A DAY, Disp: 60 capsule, Rfl: 1   sildenafil (REVATIO) 20 MG tablet, Take 1-5 pills about 30 min prior to sex. Start with 1 and increase as needed., Disp: 90 tablet, Rfl: 1   Vitamins/Minerals TABS, Take by mouth., Disp: , Rfl:    ondansetron (ZOFRAN-ODT) 4 MG disintegrating tablet, Take 1 tablet (4 mg total) by mouth every 8 (eight) hours as needed for nausea or vomiting., Disp: 30 tablet, Rfl: 2  Depression screen [facility] 2/9 09/30/2019 11/20/2018 11/08/2016  Decreased Interest 0 0 0  Down, Depressed, Hopeless 0 0 0  PHQ - 2 Score 0 0 0  Altered sleeping - - 0  Tired, decreased energy - - 1  Change in appetite - - 0  Feeling bad or failure about yourself  - - 0  Trouble concentrating - - 0  Moving slowly or fidgety/restless - - 0  Suicidal thoughts - - 0  PHQ-9 Score - - 1    No flowsheet data found.  -------------------------------------------------------------------------- O: No physical exam performed due to remote telephone encounter.  Lab results reviewed.  No results found for this or any previous visit (from the past 2160 hour(s)).   CLINICAL DATA:  Diffuse abdominal pain, nausea and vomiting, and dizziness for 5 weeks.   EXAM: CT ABDOMEN AND PELVIS WITH CONTRAST   TECHNIQUE: Multidetector CT imaging of the abdomen and pelvis was performed using the standard protocol following bolus administration of intravenous contrast.   CONTRAST:  123m OMNIPAQUE IOHEXOL 300 MG/ML  SOLN   COMPARISON:  09/19/2019   FINDINGS: Lower Chest: No acute findings.   Hepatobiliary: Moderate to severe diffuse hepatic  steatosis is noted. No hepatic masses identified. Gallbladder is unremarkable. No evidence of biliary ductal dilatation.   Pancreas:  No mass or inflammatory changes.   Spleen: Within normal limits in size and appearance.   Adrenals/Urinary Tract: No masses identified. 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 No evidence of ureteral calculi or hydronephrosis. Unremarkable unopacified urinary bladder.   Stomach/Bowel: Moderate hiatal hernia is seen. No evidence of obstruction, inflammatory process or abnormal fluid collections. Normal appendix visualized. Diverticulosis is seen mainly involving the sigmoid colon, however there is no evidence of diverticulitis.   Vascular/Lymphatic: No pathologically enlarged lymph nodes. No abdominal aortic aneurysm. Aortic atherosclerotic calcification noted.   Reproductive:  No mass or other significant abnormality.   Other:  None.   Musculoskeletal:  No suspicious bone lesions identified.   IMPRESSION: No acute findings within the abdomen or pelvis.   Moderate hiatal hernia.   Moderate to severe hepatic steatosis.  Colonic diverticulosis. No radiographic evidence of diverticulitis.   Aortic Atherosclerosis (ICD10-I70.0).     Electronically Signed   By: Marlaine Hind M.D.   On: 07/23/2020 15:45 -------------------------------------------------------------------------- A&P:  Problem List Items Addressed This Visit     Hiatal hernia with gastroesophageal reflux   Relevant Medications   ondansetron (ZOFRAN-ODT) 4 MG disintegrating tablet   dicyclomine (BENTYL) 10 MG capsule   Acute diverticulitis - Primary   Relevant Medications   ciprofloxacin (CIPRO) 500 MG tablet   Other Visit Diagnoses     Chronic gastritis without bleeding, unspecified gastritis type       Non-intractable vomiting with nausea, unspecified vomiting type       Nausea       Relevant Medications   ondansetron (ZOFRAN-ODT) 4 MG  disintegrating tablet   Diarrhea, unspecified type       Relevant Medications   dicyclomine (BENTYL) 10 MG capsule      Chronic Gastritis, alcoholic Acute Diverticulitis Nausea vomiting, Diarrhea  GERD, Hiatal Hernia  Chronic multiple GI conditions present for patient. He has been managed on various treatment plans including medication management, and GI consultation, has had prior work up EGD and other testing in past, imaging, see results above. - Previous visit in 07/2020 with [facility] - he was previously on FMLA for these conditions, that expired in 10/2020  Now out of work again will need new FMLA updated for same conditions.  He is currently experiencing acute diverticulitis flare, based on history virtually over phone today, advised that if worsening or severity increased, bleeding or fever or worse abdominal pain he should seek care at hospital ED or Urgent Care, may need CT imaging to more accurately diagnose, but remotely I will treat him for this in meantime.  Regarding Gastritis, if this is alcohol related, and he has resumed drinking regularly, this will not resolve. Counseling on these symptoms.  Start Cipro 500 BID x 7 days for diverticulitis, presumed Dicyclomine PRN for cramping bloating Continue Omeprazole 40 BID for GERD Refill Zofran ODT for nausea  He has submitted request for FMLA  1st day out of work 12/21/20  Continuous FMLA 12/21/20 through 01/12/21 - return on 01/13/21 - unless taken out by GI further He has GI consult on 01/12/21  Intermittent FMLA - Frequency 3 times per 1 month - 3 days per episode  Meds ordered this encounter  Medications   ondansetron (ZOFRAN-ODT) 4 MG disintegrating tablet    Sig: Take 1 tablet (4 mg total) by mouth every 8 (eight) hours as needed for nausea or vomiting.    Dispense:  30 tablet    Refill:  2   dicyclomine (BENTYL) 10 MG capsule    Sig: Take 1 capsule (10 mg total) by mouth 4 (four) times daily -  before meals  and at bedtime.    Dispense:  30 capsule    Refill:  2   ciprofloxacin (CIPRO) 500 MG tablet    Sig: Take 1 tablet (500 mg total) by mouth 2 (two) times daily. For 7 days    Dispense:  14 tablet    Refill:  0    Follow-up: - Return in 1 month for FMLA  Patient verbalizes understanding with the above medical recommendations including the limitation of remote medical advice.  Specific follow-up and call-back criteria were given for patient to follow-up or seek medical care more urgently if needed.   - Time spent in direct consultation with patient on phone: 21 minutes  Nobie Putnam, DO [facility] 12/22/2020, 4:13 PM

## 2021-01-04 ENCOUNTER — Ambulatory Visit: Payer: Commercial Managed Care - PPO | Admitting: Gastroenterology

## 2021-01-12 ENCOUNTER — Other Ambulatory Visit: Payer: Self-pay

## 2021-01-12 ENCOUNTER — Ambulatory Visit (INDEPENDENT_AMBULATORY_CARE_PROVIDER_SITE_OTHER): Payer: Commercial Managed Care - PPO | Admitting: Gastroenterology

## 2021-01-12 ENCOUNTER — Encounter: Payer: Self-pay | Admitting: Gastroenterology

## 2021-01-12 VITALS — BP 135/88 | HR 111 | Temp 97.7°F | Ht 66.0 in | Wt 148.0 lb

## 2021-01-12 DIAGNOSIS — K529 Noninfective gastroenteritis and colitis, unspecified: Secondary | ICD-10-CM | POA: Diagnosis not present

## 2021-01-12 DIAGNOSIS — K700 Alcoholic fatty liver: Secondary | ICD-10-CM | POA: Diagnosis not present

## 2021-01-12 DIAGNOSIS — F1029 Alcohol dependence with unspecified alcohol-induced disorder: Secondary | ICD-10-CM | POA: Diagnosis not present

## 2021-01-12 MED ORDER — FOLIC ACID 1 MG PO TABS: 1.0000 mg | ORAL_TABLET | Freq: Every day | ORAL | 2 refills | Status: DC

## 2021-01-12 MED ORDER — THIAMINE HCL 100 MG PO TABS: 100.0000 mg | ORAL_TABLET | Freq: Every day | ORAL | 2 refills | Status: DC

## 2021-01-12 NOTE — Patient Instructions (Addendum)
Please take Multivitamin, Folic acid and Thiamine 1 pill every day.

## 2021-01-12 NOTE — Progress Notes (Signed)
Patient had his blood draw by Juliann Pulse the lab tech with [employer]. After she got done getting his blood he told her he felt dizzy and felt like he was going to pass out. She went to get him Oreo cookies and peanut butter cookies. He ate the peanut butter cookies and drank some water. Checked his blood pressure and it was 135/88 and pulse 111. Patient waited 10 minutes in the room after the blood draw and he states he feels better

## 2021-01-12 NOTE — Progress Notes (Signed)
Luis Darby, MD [address]  [place]  [place], [place] [address]  Main: [phone]  Fax: [phone]    Gastroenterology Consultation  Referring Provider:     Nobie Putnam * Primary Care Physician:  Olin Hauser, DO Primary Gastroenterologist:  Dr. Sherri Sear Reason for Consultation: Alcoholic liver disease, weight loss        HPI:   Luis Porter is a 60 y.o. male referred by Dr. Parks Ranger, [name], DO  for consultation & management of approximately 1 month history of lower abdominal pain, worse in left lower quadrant associated with 2-3 soft bowel movements daily.  Patient had recent episode of acute sigmoid diverticulitis, admitted to [facility] end of April 2021, CT confirmed presence of mild diverticulitis of the proximal sigmoid colon, uncomplicated.  This was treated with Augmentin.  Patient had bloody diarrhea during this event.  Since that admission, he did not have any episodes of rectal bleeding.  Patient reports having another event in 05/2019.  He acknowledges drinking alcohol, 6-7 beers per day for several years.  He does report fatigue, lower abdominal cramps as well as epigastric pain.  He denies melena.  He is currently on omeprazole 40 mg twice daily He also reports weight loss, unable to go to work due to bowel urgency and abdominal cramps postprandial  Follow-up visit 11/12/2019 Patient reports doing significantly better since last visit.  He underwent upper endoscopy as well as colonoscopy.  Upper endoscopy was unremarkable, colonoscopy revealed benign polyps, random colon biopsies were normal.  He has cut down on alcohol to 5 beers per day from 12 pack/day.  He is currently on omeprazole 40 mg 2 times daily with significantly relieves his dyspepsia symptoms.  He is also followed by Dr. Tasia Catchings, hematologist for IV iron.  He received first infusion on 6/9.  He reports feeling better but has headaches, palpitations, jitteriness,  likely alcohol withdrawal symptoms.  He denies any black stools.  His anemia is improving.  He is requesting return to work letter  Follow-up visit 01/23/2020 Patient has been doing well from anemia standpoint.  His iron deficiency anemia has resolved.  Today, he is concerned about 2 weeks history of nonbloody diarrhea associated with significant flatulence that is interfering with his work.  He is asking for work excuse for 1 to 2 weeks until his diarrhea is resolved.  He denies eating out.  He denies abdominal pain.  He does report intermittent nausea and vomiting, particularly end of the day after he goes home.  He denies fever, chills.  He does report having bowel movements 4-5 times daily.  He denies nocturnal diarrhea. He denies consuming carbonated beverages He did not start taking gabapentin  Follow-up visit 08/10/2020 Patient was recently discharged from [facility] after he was treated for angioedema, attributed to ACE inhibitor.  Patient was in alcohol withdrawal at that time.  He was treated for it and was discharged on 07/31/2020.  Since discharge, patient has not been drinking alcohol.  He reports feeling well.  He denies any GI symptoms other than intermittent constipation.  He reports that his appetite is good and has been eating well.  He was found to have fatty liver based on the CT scan.  His LFTs have been normal.  He would like to go back to work, he works at [employer].  He does not smoke.  He does not have any concerns today.  Follow-up visit 01/12/2021 Patient is here for follow-up of  ongoing weight loss, intermittent diarrhea and lower abdominal discomfort and intermittent nausea with vomiting.  He was again recently treated with ciprofloxacin for diverticulitis.  Patient continues to drink alcohol 3 beers a day, cut back from 12 beers a day.  Patient does report that he has not been eating well.  He does not take thiamine or folic acid.  He takes multivitamin daily.  Patient denies any  rectal bleeding, melena, hematemesis, swelling of legs, abdominal distention.  Patient is jittery and shaky in office today  NSAIDs: None  Antiplts/Anticoagulants/Anti thrombotics: None  GI Procedures:  EGD and colonoscopy 10/31/2019 EGD was unremarkable  Colonoscopy - Preparation of the colon was fair. - Hemorrhoids found on perianal exam. - The examined portion of the ileum was normal. - Normal mucosa in the entire examined colon. Biopsied. - Two 3 to 5 mm polyps in the sigmoid colon and in the descending colon, removed with a cold snare. Resected and retrieved. - Severe diverticulosis in the sigmoid colon. There was no evidence of diverticular bleeding. - Non-bleeding external hemorrhoids.  DIAGNOSIS:  A. COLON, RANDOM; BIOPSIES:  - BENIGN COLONIC MUCOSA, NEGATIVE FOR ACTIVE MUCOSAL COLITIS,  LYMPHOCYTIC/MICROSCOPIC COLITIS, GRANULOMA AND DYSPLASIA.   B. COLON POLYP, DESCENDING; BIOPSY:  - TUBULAR ADENOMA AND FRAGMENTS OF BENIGN COLONIC MUCOSA.  - NEGATIVE FOR HIGH-GRADE DYSPLASIA AND MALIGNANCY.   C. COLON POLYP, SIGMOID, BIOPSY:  - TUBULAR ADENOMA AND FRAGMENTS OF BENIGN COLONIC MUCOSA.  - NEGATIVE FOR HIGH-GRADE DYSPLASIA AND MALIGNANCY.   EGD and colonoscopy 12/30/2016 by Dr. Vicente Males DIAGNOSIS:  A. STOMACH POLYP; COLD BIOPSY:  - HYPERPLASTIC GASTRIC POLYP.  - NEGATIVE FOR H. PYLORI, DYSPLASIA, AND MALIGNANCY.   B. GEJ; COLD BIOPSY:  - REFLUX GASTROESOPHAGITIS.  - NEGATIVE FOR DYSPLASIA AND MALIGNANCY.   C. COLON POLYP, CECUM; COLD SNARE:  - TUBULAR ADENOMA.  - NEGATIVE FOR HIGH GRADE DYSPLASIA AND MALIGNANCY.   D. COLON POLYP, TRANSVERSE; COLD SNARE:  - TUBULAR ADENOMA.  - NEGATIVE FOR HIGH GRADE DYSPLASIA AND MALIGNANCY.   E. COLON POLYP 3, SIGMOID; COLD SNARE:  - TUBULAR ADENOMA (4 FRAGMENTS).  - NEGATIVE FOR HIGH GRADE DYSPLASIA AND MALIGNANCY.   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   Previously repaired x 1    Past Surgical History:  Procedure Laterality Date   BACK SURGERY     COLONOSCOPY WITH PROPOFOL N/A 12/30/2016   Procedure: COLONOSCOPY WITH PROPOFOL;  Surgeon: [name], MD;  Location: [facility] ENDOSCOPY;  Service: Endoscopy;  Laterality: N/A;   COLONOSCOPY WITH PROPOFOL N/A 10/31/2019   Procedure: COLONOSCOPY WITH PROPOFOL;  Surgeon: Lin Landsman, MD;  Location: [facility] ENDOSCOPY;  Service: Gastroenterology;  Laterality: N/A;   ESOPHAGOGASTRODUODENOSCOPY (EGD) WITH PROPOFOL N/A 12/30/2016   Procedure: ESOPHAGOGASTRODUODENOSCOPY (EGD) WITH PROPOFOL;  Surgeon: [name], MD;  Location: [facility] ENDOSCOPY;  Service: Endoscopy;  Laterality: N/A;   ESOPHAGOGASTRODUODENOSCOPY (EGD) WITH PROPOFOL N/A 10/31/2019   Procedure: ESOPHAGOGASTRODUODENOSCOPY (EGD) WITH PROPOFOL;  Surgeon: Lin Landsman, MD;  Location: [facility] ENDOSCOPY;  Service: Gastroenterology;  Laterality: N/A;   HERNIA REPAIR  [id number]   Umbilical Hernia Repair   LUMBAR LAMINECTOMY/DECOMPRESSION MICRODISCECTOMY Right 05/18/2016   Procedure: right L4-5 microdiscectomy;  Surgeon: Blanche East, MD;  Location: [facility] ORS;  Service: Neurosurgery;  Laterality: Right;   TONSILLECTOMY  UMBILICAL HERNIA REPAIR N/A 06/29/2018   Procedure: LAPAROSCOPIC REPAIR OF RECURRENT UMBILICAL HERNIA WITH MESH;  Surgeon: Vickie Epley, MD;  Location: [facility] ORS;  Service: General;  Laterality: N/A;     Current Outpatient Medications:    albuterol (VENTOLIN HFA) 108 (90 Base) MCG/ACT inhaler, INHALE 1-2 PUFFS INTO THE LUNGS EVERY 4 (FOUR) HOURS AS NEEDED FOR WHEEZING OR SHORTNESS OF BREATH., Disp: 6.7 each, Rfl: 1   amLODipine (NORVASC) 10 MG tablet, Take 1 tablet (10 mg total) by mouth daily., Disp: 90 tablet, Rfl: 1   dicyclomine (BENTYL) 10 MG capsule, Take 1 capsule (10  mg total) by mouth 4 (four) times daily -  before meals and at bedtime., Disp: 30 capsule, Rfl: 2   fluticasone (FLONASE) 50 MCG/ACT nasal spray, PLACE 2 SPRAYS INTO BOTH NOSTRILS DAILY. USE FOR 4-6 WEEKS THEN STOP AND USE SEASONALLY OR AS NEEDED., Disp: 48 mL, Rfl: 1   folic acid (FOLVITE) 1 MG tablet, Take 1 tablet (1 mg total) by mouth daily., Disp: 30 tablet, Rfl: 2   meclizine (ANTIVERT) 25 MG tablet, Take 1 tablet (25 mg total) by mouth 3 (three) times daily as needed for dizziness or nausea., Disp: 60 tablet, Rfl: 1   omeprazole (PRILOSEC) 40 MG capsule, TAKE 1 CAPSULE BY MOUTH TWICE A DAY, Disp: 60 capsule, Rfl: 1   ondansetron (ZOFRAN-ODT) 4 MG disintegrating tablet, Take 1 tablet (4 mg total) by mouth every 8 (eight) hours as needed for nausea or vomiting., Disp: 30 tablet, Rfl: 2   sildenafil (REVATIO) 20 MG tablet, Take 1-5 pills about 30 min prior to sex. Start with 1 and increase as needed., Disp: 90 tablet, Rfl: 1   thiamine 100 MG tablet, Take 1 tablet (100 mg total) by mouth daily., Disp: 30 tablet, Rfl: 2   Vitamins/Minerals TABS, Take by mouth., Disp: , Rfl:    b complex vitamins capsule, Take 1 capsule by mouth daily. (Patient not taking: Reported on 01/12/2021), Disp: , Rfl:    naltrexone (DEPADE) 50 MG tablet, Take 1 tablet (50 mg total) by mouth daily. Increase to '100mg'$  daily after 1-2 weeks if needed (Patient not taking: Reported on 01/12/2021), Disp: 60 tablet, Rfl: 0  Family History  Problem Relation Age of Onset   Hypertension Mother    Cancer Mother        Ovarian   Cervical cancer Mother    Hypertension Father    Cancer Father        tongue   Throat cancer Father    Hypertension Brother    Prostate cancer Neg Hx    Breast cancer Neg Hx    Colon cancer Neg Hx    Heart attack Neg Hx    Stroke Neg Hx      Social History   Tobacco Use   Smoking status: Former    Packs/day: 2.00    Years: 25.00    Pack years: 50.00    Types: Cigarettes    Quit date:  05/31/1987    Years since quitting: 33.6   Smokeless tobacco: Never  Vaping Use   Vaping Use: Never used  Substance Use Topics   Alcohol use: Not Currently    Alcohol/week: 14.0 standard drinks    Types: 14 Cans of beer per week    Comment: former usage. States he drinks around 2 beer a day   Drug use: No    Allergies as of 01/12/2021 - Review Complete 01/12/2021  Allergen Reaction Noted   Benazepril  07/28/2020   Nsaids Other (See Comments) 05/09/2016    Review of Systems:    All systems reviewed and negative except where noted in HPI.   Physical Exam:  BP 135/88 (BP Location: Left Arm, Patient Position: Sitting, Cuff Size: Normal)   Pulse (!) 111   Temp 97.7 F (36.5 C) (Oral)   Ht '5\' 6"'$  (1.676 m)   Wt 148 lb (67.1 kg)   BMI 23.89 kg/m  No LMP for male patient.  General:   Alert, moderately built, moderately nourished, pleasant and cooperative in NAD Head:  Normocephalic and atraumatic. Eyes:  Sclera clear, no icterus.   Conjunctiva pink. Ears:  Normal auditory acuity. Nose:  No deformity, discharge, or lesions. Mouth:  No deformity or lesions,oropharynx pink & moist. Neck:  Supple; no masses or thyromegaly. Lungs:  Respirations even and unlabored.  Clear throughout to auscultation.   No wheezes, crackles, or rhonchi. No acute distress. Heart:  Regular rate and rhythm; no murmurs, clicks, rubs, or gallops. Abdomen:  Normal bowel sounds. Soft, non-tender and mildly distended, tympanic without masses, hepatosplenomegaly or hernias noted.  No guarding or rebound tenderness.   Rectal: Not performed Msk:  Symmetrical without gross deformities. Good, equal movement & strength bilaterally. Pulses:  Normal pulses noted. Extremities:  No clubbing or edema.  No cyanosis. Neurologic:  Alert and oriented x3;  grossly normal neurologically. Skin:  Intact without significant lesions or rashes. No jaundice. Psych:  Alert and cooperative. Normal mood and affect.  Imaging  Studies: Reviewed  Assessment and Plan:   Luis Porter is a 60 y.o. male with history of alcohol dependence, no evidence of chronic liver disease or cirrhosis is seen for follow-up of recurrence of nonbloody diarrhea with flatulence. He had history of ?recurrent acute uncomplicated sigmoid diverticulitis which was treated with antibiotics in the past Stool studies were negative for infectious etiology, colonoscopy with TI evaluation and colon biopsies were unremarkable.   Check pancreatic fecal elastase levels Encourage patient to have both start Ensure at least twice daily and regular meal Encouraged him to try to cut down further on alcohol intake Check LFTs and CBC today Strongly advised him to take thiamine and folic acid in addition to multivitamin daily, prescription sent  Follow up in 3 months   Luis Darby, MD

## 2021-01-13 LAB — CBC
Hematocrit: 47.1 % (ref 37.5–51.0)
Hemoglobin: 16.1 g/dL (ref 13.0–17.7)
MCH: 32 pg (ref 26.6–33.0)
MCHC: 34.2 g/dL (ref 31.5–35.7)
MCV: 94 fL (ref 79–97)
Platelets: 165 10*3/uL (ref 150–450)
RBC: 5.03 x10E6/uL (ref 4.14–5.80)
RDW: 15.4 % (ref 11.6–15.4)
WBC: 5.4 10*3/uL (ref 3.4–10.8)

## 2021-01-13 LAB — HEPATIC FUNCTION PANEL
ALT: 68 IU/L — ABNORMAL HIGH (ref 0–44)
AST: 125 IU/L — ABNORMAL HIGH (ref 0–40)
Albumin: 4.6 g/dL (ref 3.8–4.9)
Alkaline Phosphatase: 96 IU/L (ref 44–121)
Bilirubin Total: 0.5 mg/dL (ref 0.0–1.2)
Bilirubin, Direct: 0.14 mg/dL (ref 0.00–0.40)
Total Protein: 7.4 g/dL (ref 6.0–8.5)

## 2021-01-14 ENCOUNTER — Other Ambulatory Visit: Payer: Self-pay | Admitting: Family Medicine

## 2021-01-14 DIAGNOSIS — K449 Diaphragmatic hernia without obstruction or gangrene: Secondary | ICD-10-CM

## 2021-01-14 DIAGNOSIS — K219 Gastro-esophageal reflux disease without esophagitis: Secondary | ICD-10-CM

## 2021-01-15 ENCOUNTER — Telehealth: Payer: Self-pay | Admitting: Family Medicine

## 2021-01-15 NOTE — Telephone Encounter (Signed)
Luis Porter calling from [place] is calling to see if a Health Care Provider Statement. And most recent OV visits. Fax was sent 01/09/21, and today with this request. Please advise [phone]

## 2021-01-16 ENCOUNTER — Inpatient Hospital Stay (HOSPITAL_COMMUNITY)
Admission: EM | Admit: 2021-01-16 | Discharge: 2021-01-21 | DRG: 286 | Disposition: A | Discharge status: Home / Self Care | Payer: Commercial Managed Care - PPO | Attending: Internal Medicine | Admitting: Internal Medicine

## 2021-01-16 ENCOUNTER — Other Ambulatory Visit: Payer: Self-pay

## 2021-01-16 ENCOUNTER — Encounter (HOSPITAL_COMMUNITY): Payer: Self-pay | Admitting: Emergency Medicine

## 2021-01-16 ENCOUNTER — Emergency Department (HOSPITAL_COMMUNITY): Payer: Commercial Managed Care - PPO

## 2021-01-16 DIAGNOSIS — I110 Hypertensive heart disease with heart failure: Principal | ICD-10-CM | POA: Diagnosis present

## 2021-01-16 DIAGNOSIS — F10231 Alcohol dependence with withdrawal delirium: Secondary | ICD-10-CM | POA: Diagnosis not present

## 2021-01-16 DIAGNOSIS — N401 Enlarged prostate with lower urinary tract symptoms: Secondary | ICD-10-CM | POA: Diagnosis present

## 2021-01-16 DIAGNOSIS — K219 Gastro-esophageal reflux disease without esophagitis: Secondary | ICD-10-CM | POA: Diagnosis present

## 2021-01-16 DIAGNOSIS — R778 Other specified abnormalities of plasma proteins: Secondary | ICD-10-CM | POA: Diagnosis present

## 2021-01-16 DIAGNOSIS — Z886 Allergy status to analgesic agent status: Secondary | ICD-10-CM

## 2021-01-16 DIAGNOSIS — R443 Hallucinations, unspecified: Secondary | ICD-10-CM | POA: Diagnosis not present

## 2021-01-16 DIAGNOSIS — I426 Alcoholic cardiomyopathy: Secondary | ICD-10-CM | POA: Diagnosis present

## 2021-01-16 DIAGNOSIS — Z79899 Other long term (current) drug therapy: Secondary | ICD-10-CM

## 2021-01-16 DIAGNOSIS — I5041 Acute combined systolic (congestive) and diastolic (congestive) heart failure: Secondary | ICD-10-CM | POA: Diagnosis present

## 2021-01-16 DIAGNOSIS — K2920 Alcoholic gastritis without bleeding: Secondary | ICD-10-CM | POA: Diagnosis present

## 2021-01-16 DIAGNOSIS — I5A Non-ischemic myocardial injury (non-traumatic): Secondary | ICD-10-CM | POA: Diagnosis present

## 2021-01-16 DIAGNOSIS — E876 Hypokalemia: Secondary | ICD-10-CM | POA: Diagnosis present

## 2021-01-16 DIAGNOSIS — N138 Other obstructive and reflux uropathy: Secondary | ICD-10-CM | POA: Diagnosis present

## 2021-01-16 DIAGNOSIS — I429 Cardiomyopathy, unspecified: Secondary | ICD-10-CM

## 2021-01-16 DIAGNOSIS — Z87891 Personal history of nicotine dependence: Secondary | ICD-10-CM

## 2021-01-16 DIAGNOSIS — R55 Syncope and collapse: Secondary | ICD-10-CM | POA: Diagnosis present

## 2021-01-16 DIAGNOSIS — F10939 Alcohol use, unspecified with withdrawal, unspecified: Secondary | ICD-10-CM | POA: Diagnosis present

## 2021-01-16 DIAGNOSIS — I10 Essential (primary) hypertension: Secondary | ICD-10-CM | POA: Diagnosis present

## 2021-01-16 DIAGNOSIS — Z20822 Contact with and (suspected) exposure to covid-19: Secondary | ICD-10-CM | POA: Diagnosis present

## 2021-01-16 DIAGNOSIS — F10230 Alcohol dependence with withdrawal, uncomplicated: Secondary | ICD-10-CM | POA: Diagnosis present

## 2021-01-16 DIAGNOSIS — Z888 Allergy status to other drugs, medicaments and biological substances status: Secondary | ICD-10-CM

## 2021-01-16 DIAGNOSIS — F1010 Alcohol abuse, uncomplicated: Secondary | ICD-10-CM | POA: Diagnosis present

## 2021-01-16 DIAGNOSIS — R634 Abnormal weight loss: Secondary | ICD-10-CM | POA: Diagnosis present

## 2021-01-16 DIAGNOSIS — Z6823 Body mass index (BMI) 23.0-23.9, adult: Secondary | ICD-10-CM

## 2021-01-16 DIAGNOSIS — Y908 Blood alcohol level of 240 mg/100 ml or more: Secondary | ICD-10-CM | POA: Diagnosis present

## 2021-01-16 DIAGNOSIS — K700 Alcoholic fatty liver: Secondary | ICD-10-CM | POA: Diagnosis present

## 2021-01-16 DIAGNOSIS — I420 Dilated cardiomyopathy: Secondary | ICD-10-CM | POA: Diagnosis present

## 2021-01-16 DIAGNOSIS — Z8249 Family history of ischemic heart disease and other diseases of the circulatory system: Secondary | ICD-10-CM

## 2021-01-16 LAB — CBC WITH DIFFERENTIAL/PLATELET
Abs Immature Granulocytes: 0.01 10*3/uL (ref 0.00–0.07)
Basophils Absolute: 0.2 10*3/uL — ABNORMAL HIGH (ref 0.0–0.1)
Basophils Relative: 4 %
Eosinophils Absolute: 0.1 10*3/uL (ref 0.0–0.5)
Eosinophils Relative: 2 %
HCT: 43.8 % (ref 39.0–52.0)
Hemoglobin: 15.6 g/dL (ref 13.0–17.0)
Immature Granulocytes: 0 %
Lymphocytes Relative: 35 %
Lymphs Abs: 1.7 10*3/uL (ref 0.7–4.0)
MCH: 32.7 pg (ref 26.0–34.0)
MCHC: 35.6 g/dL (ref 30.0–36.0)
MCV: 91.8 fL (ref 80.0–100.0)
Monocytes Absolute: 0.7 10*3/uL (ref 0.1–1.0)
Monocytes Relative: 14 %
Neutro Abs: 2.2 10*3/uL (ref 1.7–7.7)
Neutrophils Relative %: 45 %
Platelets: 179 10*3/uL (ref 150–400)
RBC: 4.77 MIL/uL (ref 4.22–5.81)
RDW: 15.2 % (ref 11.5–15.5)
WBC: 4.8 10*3/uL (ref 4.0–10.5)
nRBC: 0 % (ref 0.0–0.2)

## 2021-01-16 LAB — COMPREHENSIVE METABOLIC PANEL
ALT: 71 U/L — ABNORMAL HIGH (ref 0–44)
AST: 123 U/L — ABNORMAL HIGH (ref 15–41)
Albumin: 3.5 g/dL (ref 3.5–5.0)
Alkaline Phosphatase: 56 U/L (ref 38–126)
Anion gap: 13 (ref 5–15)
BUN: 6 mg/dL (ref 6–20)
CO2: 22 mmol/L (ref 22–32)
Calcium: 7.6 mg/dL — ABNORMAL LOW (ref 8.9–10.3)
Chloride: 106 mmol/L (ref 98–111)
Creatinine, Ser: 0.59 mg/dL — ABNORMAL LOW (ref 0.61–1.24)
GFR, Estimated: >60 mL/min (ref >60)
Glucose, Bld: 85 mg/dL (ref 70–99)
Potassium: 3.4 mmol/L — ABNORMAL LOW (ref 3.5–5.1)
Sodium: 141 mmol/L (ref 135–145)
Total Bilirubin: 0.8 mg/dL (ref 0.3–1.2)
Total Protein: 6.6 g/dL (ref 6.5–8.1)

## 2021-01-16 LAB — TROPONIN I (HIGH SENSITIVITY)
Troponin I (High Sensitivity): 29 ng/L — ABNORMAL HIGH (ref <18)
Troponin I (High Sensitivity): 37 ng/L — ABNORMAL HIGH (ref <18)
Troponin I (High Sensitivity): 30 ng/L — ABNORMAL HIGH (ref <18)
Troponin I (High Sensitivity): 28 ng/L — ABNORMAL HIGH (ref <18)

## 2021-01-16 LAB — RESP PANEL BY RT-PCR (FLU A&B, COVID) ARPGX2
Influenza A by PCR: NEGATIVE
Influenza B by PCR: NEGATIVE
SARS Coronavirus 2 by RT PCR: NEGATIVE

## 2021-01-16 LAB — CBG MONITORING, ED: Glucose-Capillary: 100 mg/dL — ABNORMAL HIGH (ref 70–99)

## 2021-01-16 LAB — ETHANOL: Alcohol, Ethyl (B): 358 mg/dL (ref <10)

## 2021-01-16 LAB — MAGNESIUM: Magnesium: 2 mg/dL (ref 1.7–2.4)

## 2021-01-16 IMAGING — DX DG CHEST 1V PORT
2 series · 2 of 2 positions shown · non-contrast
Comparison: 02/17/2020

CLINICAL DATA: Per EMS, patient from home, c/o 25 pound weight loss
in 1 month with decreased PO intake. Fatigue x 3 days. Reports he
consumes 5 drinks daily. One shot and one Tiger today. Hx of liver
disease and anemia.

EXAM:
PORTABLE CHEST 1 VIEW

[chest ap (1 of 2)]
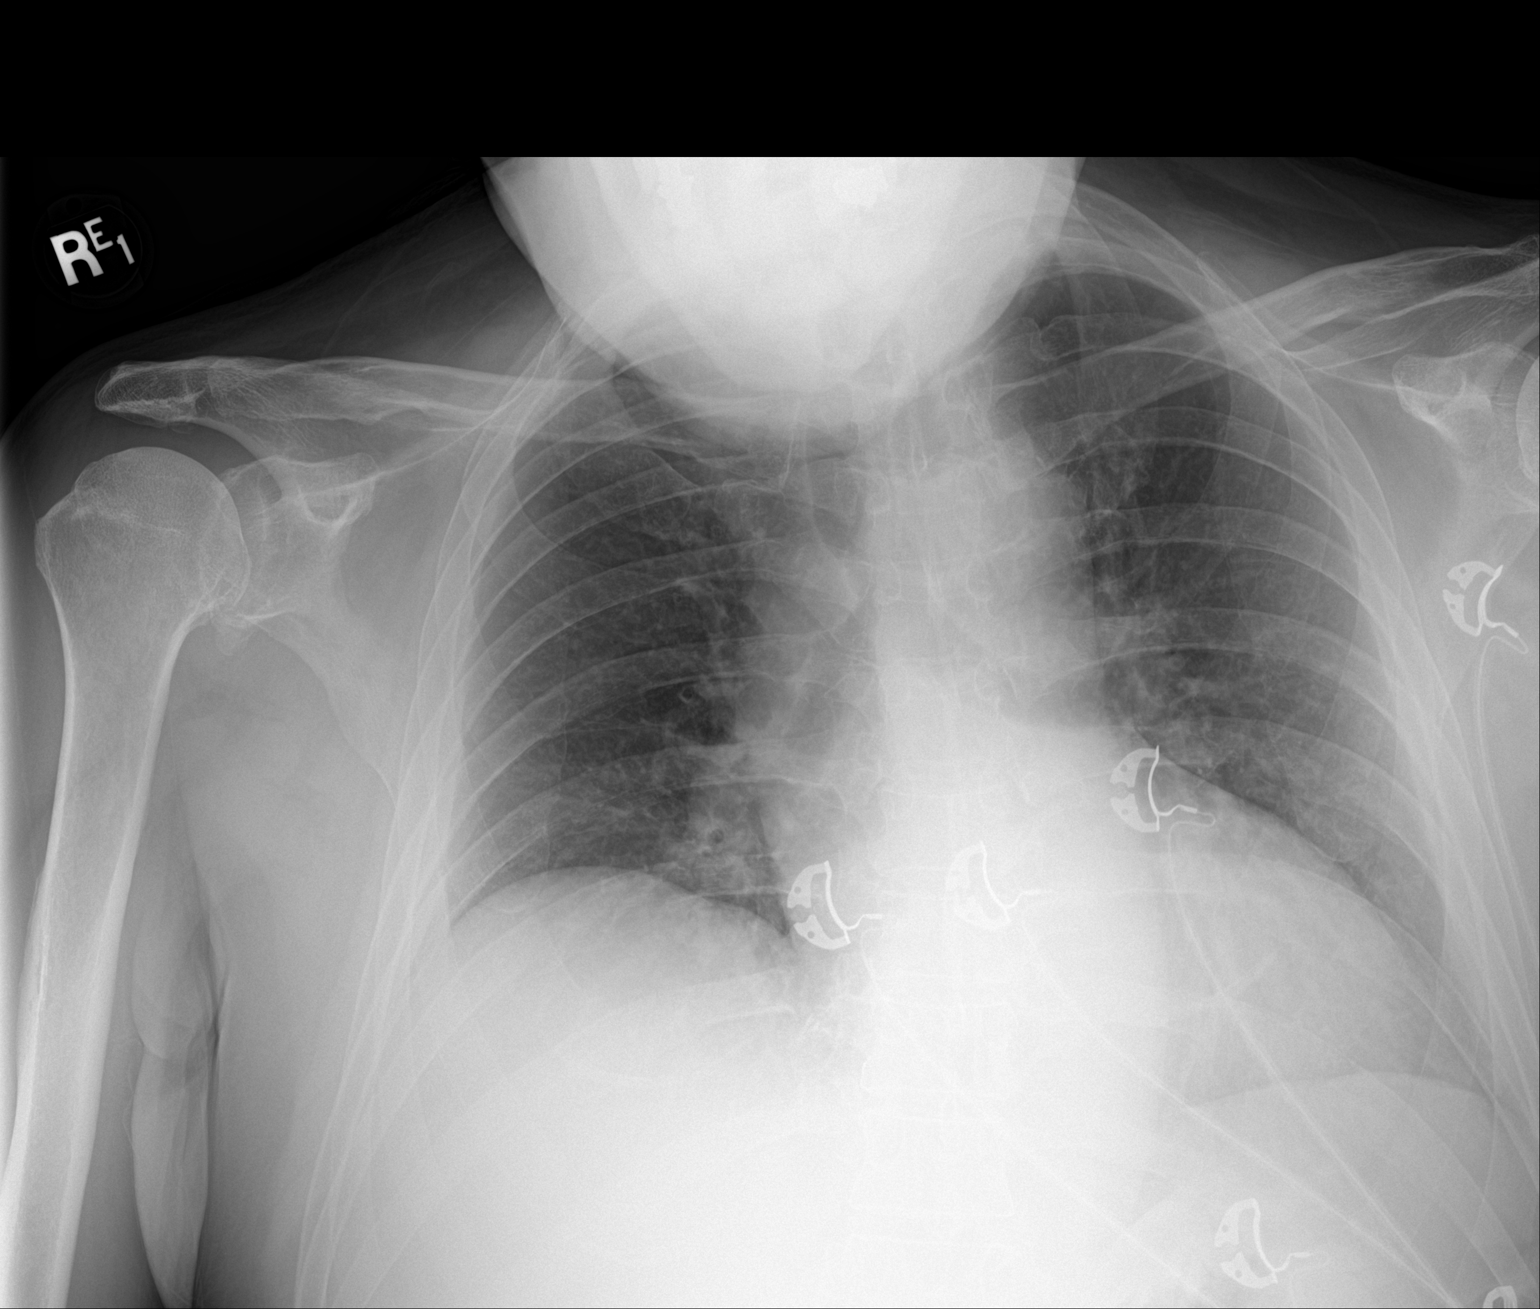

[chest ap (2 of 2)]
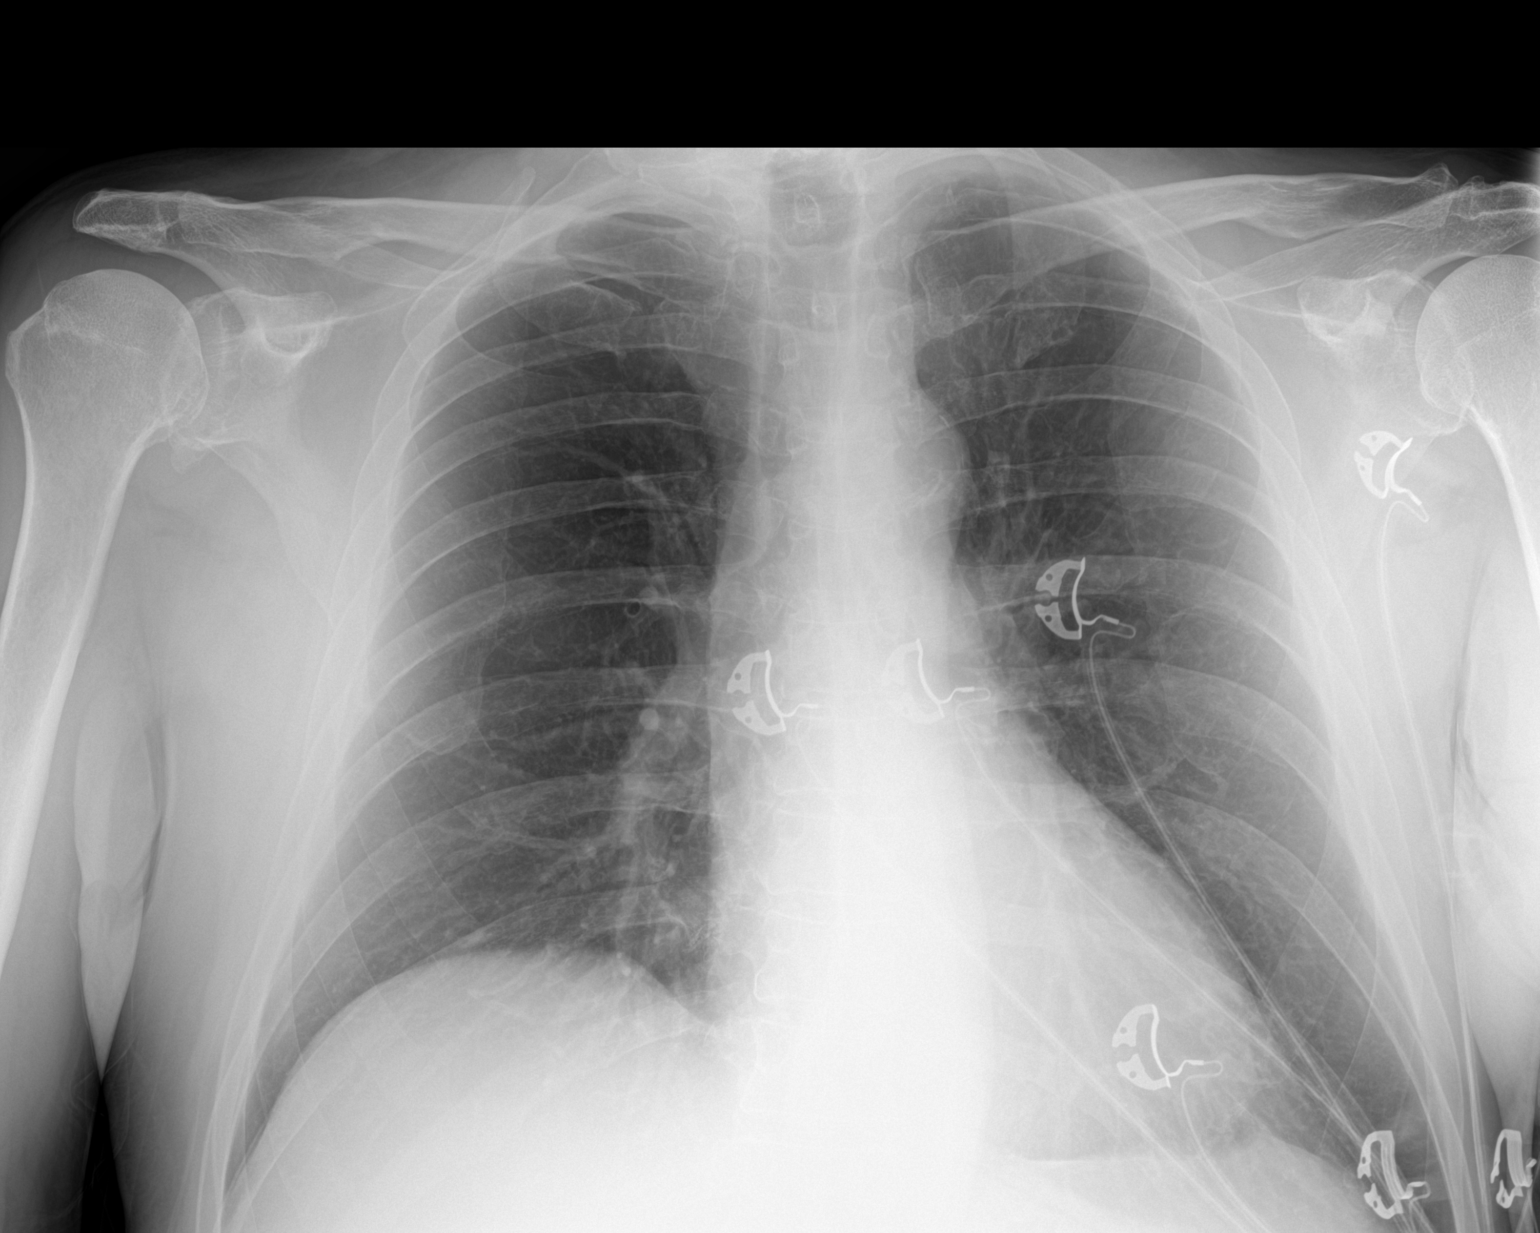

[2 of 2 positions shown; findings below may reference images not displayed]

FINDINGS: Normal cardiac silhouette.  No mediastinal or hilar masses.

Clear lungs.  No convincing pleural effusion or pneumothorax.

Skeletal structures are grossly intact.
IMPRESSION: No active disease.

## 2021-01-16 MED ORDER — ADULT MULTIVITAMIN W/MINERALS CH
1.0000 | ORAL_TABLET | Freq: Every day | ORAL | Status: DC
  Administered 2021-01-16 – 2021-01-21 (×6): 1 via ORAL
  Filled 2021-01-16 (×6): qty 1

## 2021-01-16 MED ORDER — ENOXAPARIN SODIUM 40 MG/0.4ML IJ SOSY
40.0000 mg | PREFILLED_SYRINGE | INTRAMUSCULAR | Status: DC
  Administered 2021-01-16 – 2021-01-20 (×5): 40 mg via SUBCUTANEOUS
  Filled 2021-01-16 (×5): qty 0.4

## 2021-01-16 MED ORDER — OXYCODONE HCL 5 MG PO TABS
5.0000 mg | ORAL_TABLET | ORAL | Status: DC | PRN
  Administered 2021-01-18: 5 mg via ORAL
  Filled 2021-01-16: qty 1

## 2021-01-16 MED ORDER — TRAZODONE HCL 50 MG PO TABS
50.0000 mg | ORAL_TABLET | Freq: Every evening | ORAL | Status: DC | PRN
  Administered 2021-01-16 – 2021-01-20 (×3): 50 mg via ORAL
  Filled 2021-01-16 (×3): qty 1

## 2021-01-16 MED ORDER — LORAZEPAM 1 MG PO TABS: 0.0000 mg | ORAL_TABLET | Freq: Two times a day (BID) | ORAL | Status: DC

## 2021-01-16 MED ORDER — ACETAMINOPHEN 650 MG RE SUPP: 650.0000 mg | Freq: Four times a day (QID) | RECTAL | Status: DC | PRN

## 2021-01-16 MED ORDER — AMLODIPINE BESYLATE 10 MG PO TABS
10.0000 mg | ORAL_TABLET | Freq: Every day | ORAL | Status: DC
  Administered 2021-01-17 – 2021-01-19 (×3): 10 mg via ORAL
  Filled 2021-01-16 (×3): qty 1

## 2021-01-16 MED ORDER — THIAMINE HCL 100 MG PO TABS
100.0000 mg | ORAL_TABLET | Freq: Every day | ORAL | Status: DC
  Administered 2021-01-16 – 2021-01-21 (×6): 100 mg via ORAL
  Filled 2021-01-16 (×6): qty 1

## 2021-01-16 MED ORDER — PANTOPRAZOLE SODIUM 40 MG PO TBEC
80.0000 mg | DELAYED_RELEASE_TABLET | Freq: Every day | ORAL | Status: DC
Start: 2021-01-17 — End: 2021-01-22
  Administered 2021-01-17 – 2021-01-21 (×5): 80 mg via ORAL
  Filled 2021-01-16 (×5): qty 2

## 2021-01-16 MED ORDER — THIAMINE HCL 100 MG/ML IJ SOLN
100.0000 mg | Freq: Every day | INTRAMUSCULAR | Status: DC
Start: 2021-01-16 — End: 2021-01-22

## 2021-01-16 MED ORDER — LORAZEPAM 2 MG/ML IJ SOLN
0.0000 mg | Freq: Four times a day (QID) | INTRAMUSCULAR | Status: DC
  Filled 2021-01-16: qty 1

## 2021-01-16 MED ORDER — ONDANSETRON HCL 4 MG PO TABS: 4.0000 mg | ORAL_TABLET | Freq: Four times a day (QID) | ORAL | Status: DC | PRN

## 2021-01-16 MED ORDER — ACETAMINOPHEN 325 MG PO TABS: 650.0000 mg | ORAL_TABLET | Freq: Four times a day (QID) | ORAL | Status: DC | PRN

## 2021-01-16 MED ORDER — ONDANSETRON HCL 4 MG/2ML IJ SOLN: 4.0000 mg | Freq: Four times a day (QID) | INTRAMUSCULAR | Status: DC | PRN

## 2021-01-16 MED ORDER — LORAZEPAM 1 MG PO TABS
0.0000 mg | ORAL_TABLET | Freq: Four times a day (QID) | ORAL | Status: DC
  Administered 2021-01-16: 1 mg via ORAL
  Administered 2021-01-16: 2 mg via ORAL
  Administered 2021-01-17 (×2): 1 mg via ORAL
  Administered 2021-01-17: 2 mg via ORAL
  Administered 2021-01-18: 1 mg via ORAL
  Administered 2021-01-18: 2 mg via ORAL
  Filled 2021-01-16: qty 2
  Filled 2021-01-16 (×2): qty 1
  Filled 2021-01-16 (×3): qty 2
  Filled 2021-01-16: qty 1
  Filled 2021-01-16: qty 2

## 2021-01-16 MED ORDER — LORAZEPAM 2 MG/ML IJ SOLN: 0.0000 mg | Freq: Two times a day (BID) | INTRAMUSCULAR | Status: DC

## 2021-01-16 MED ORDER — SODIUM CHLORIDE 0.9 % IV BOLUS
1000.0000 mL | Freq: Once | INTRAVENOUS | Status: AC
  Administered 2021-01-16: 1000 mL via INTRAVENOUS

## 2021-01-16 MED ORDER — FOLIC ACID 1 MG PO TABS
1.0000 mg | ORAL_TABLET | Freq: Every day | ORAL | Status: DC
  Administered 2021-01-16 – 2021-01-21 (×6): 1 mg via ORAL
  Filled 2021-01-16 (×6): qty 1

## 2021-01-16 MED ORDER — ASPIRIN 81 MG PO CHEW: 324.0000 mg | CHEWABLE_TABLET | Freq: Once | ORAL | Status: DC

## 2021-01-16 NOTE — ED Notes (Signed)
Pt ambulatory to bathroom with minimal assistance  

## 2021-01-16 NOTE — ED Provider Notes (Signed)
[facility] DEPT Provider Note   CSN: [id number] Arrival date & time: 01/16/21  1040     History Chief Complaint  Patient presents with   Weight Loss   Fatigue    Luis Porter is a 60 y.o. male hypertension, gastric ulcer, alcohol induced fatty liver, chronic alcoholic gastritis, alcohol use.  Patient presents emergency department with a chief plaint of syncope, weight loss, and fatigue.  Patient states that his weight loss and fatigue have been present over the last month.  Patient reports that he has lost approximately 25 pounds over the last month.  Patient states that he has had a decreased appetite and has not been eating as much over this time.  Patient endorses subjective fevers and chills.  Patient denies any night sweats.  Patient reports that today he had 2 syncopal episodes at home.  Patient reports that both episodes were unwitnessed.  Patient denies any falls or injuries with the syncopal episodes however does report that he broke his glasses.  Endorses preceding lightheadedness.  No preceding chest pain, shortness of breath, sudden onset of headache.  Patient denies any neck pain, back pain, visual disturbance, weakness, saddle anesthesia, incontinence.  Patient endorses numbness to right lower extremity at baseline due to history of nerve damage.  Numbness is unchanged and no new numbness today.   Patient endorses rhinorrhea and productive cough.  Cough produces clear mucus.  Patient has had this over the last year.  Patient endorses daily alcohol use.  States that he currently drinks 5-6 beers and a few shots daily.  Patient states that today he has had 1 beer and 1 shot of liquor.  Patient has been taking prescribed thiamine.  Patient denies any illicit drug use.  Patient is not on any blood thinners.  HPI     Past Medical History:  Diagnosis Date   Diverticulosis    Gastric ulcer    GERD (gastroesophageal reflux disease)     History of hiatal hernia    Hypertension    Iron deficiency anemia 10/18/2019   Recurrent umbilical hernia with incarceration 11/08/2016   Previously repaired x 1    Patient Active Problem List   Diagnosis Date Noted   Alcohol induced fatty liver 08/04/2020   Chronic alcoholic gastritis without hemorrhage 08/04/2020   Alcohol dependence in early full remission ([place]) 08/04/2020   Angioedema 07/28/2020   Alcohol withdrawal syndrome without complication ([place])    [id number] 02/19/2020   Cough 02/19/2020   Abdominal pain, epigastric    GERD (gastroesophageal reflux disease) 09/19/2019   Acute diverticulitis 09/19/2019   Alcohol abuse 09/19/2019   Essential tremor 07/04/2019   Hx of adenomatous colonic polyps 01/06/2017   Herniated lumbar intervertebral disc 05/18/2016   Mixed hyperlipidemia 05/06/2014   BPH with obstruction/lower urinary tract symptoms 03/31/2014   Insomnia 03/03/2014   Diverticulosis of large intestine without hemorrhage 03/03/2014   Alcohol abuse, daily use 03/03/2014   Essential hypertension 07/08/2011   Hiatal hernia with gastroesophageal reflux 07/07/2011   Lumbago with sciatica 07/07/2011    Past Surgical History:  Procedure Laterality Date   BACK SURGERY     COLONOSCOPY WITH PROPOFOL N/A 12/30/2016   Procedure: COLONOSCOPY WITH PROPOFOL;  Surgeon: [name], MD;  Location: [facility] ENDOSCOPY;  Service: Endoscopy;  Laterality: N/A;   COLONOSCOPY WITH PROPOFOL N/A 10/31/2019   Procedure: COLONOSCOPY WITH PROPOFOL;  Surgeon: Lin Landsman, MD;  Location: [facility] ENDOSCOPY;  Service: Gastroenterology;  Laterality: N/A;   ESOPHAGOGASTRODUODENOSCOPY (EGD)  WITH PROPOFOL N/A 12/30/2016   Procedure: ESOPHAGOGASTRODUODENOSCOPY (EGD) WITH PROPOFOL;  Surgeon: [name], MD;  Location: [facility] ENDOSCOPY;  Service: Endoscopy;  Laterality: N/A;   ESOPHAGOGASTRODUODENOSCOPY (EGD) WITH PROPOFOL N/A 10/31/2019   Procedure: ESOPHAGOGASTRODUODENOSCOPY (EGD) WITH PROPOFOL;  Surgeon:  Lin Landsman, MD;  Location: [facility] ENDOSCOPY;  Service: Gastroenterology;  Laterality: N/A;   HERNIA REPAIR  [id number]   Umbilical Hernia Repair   LUMBAR LAMINECTOMY/DECOMPRESSION MICRODISCECTOMY Right 05/18/2016   Procedure: right L4-5 microdiscectomy;  Surgeon: Blanche East, MD;  Location: [facility] ORS;  Service: Neurosurgery;  Laterality: Right;   TONSILLECTOMY     UMBILICAL HERNIA REPAIR N/A 06/29/2018   Procedure: LAPAROSCOPIC REPAIR OF RECURRENT UMBILICAL HERNIA WITH MESH;  Surgeon: Vickie Epley, MD;  Location: [facility] ORS;  Service: General;  Laterality: N/A;       Family History  Problem Relation Age of Onset   Hypertension Mother    Cancer Mother        Ovarian   Cervical cancer Mother    Hypertension Father    Cancer Father        tongue   Throat cancer Father    Hypertension Brother    Prostate cancer Neg Hx    Breast cancer Neg Hx    Colon cancer Neg Hx    Heart attack Neg Hx    Stroke Neg Hx     Social History   Tobacco Use   Smoking status: Former    Packs/day: 2.00    Years: 25.00    Pack years: 50.00    Types: Cigarettes    Quit date: 05/31/1987    Years since quitting: 33.6   Smokeless tobacco: Never  Vaping Use   Vaping Use: Never used  Substance Use Topics   Alcohol use: Not Currently    Alcohol/week: 14.0 standard drinks    Types: 14 Cans of beer per week    Comment: former usage. States he drinks around 2 beer a day   Drug use: No    Home Medications Prior to Admission medications   Medication Sig Start Date End Date Taking? Authorizing Provider  albuterol (VENTOLIN HFA) 108 (90 Base) MCG/ACT inhaler INHALE 1-2 PUFFS INTO THE LUNGS EVERY 4 (FOUR) HOURS AS NEEDED FOR WHEEZING OR SHORTNESS OF BREATH. 04/11/20   Karamalegos, [name], DO  amLODipine (NORVASC) 10 MG tablet Take 1 tablet (10 mg total) by mouth daily. 09/01/20   Olin Hauser, DO  b complex vitamins capsule Take 1 capsule by mouth daily. Patient not taking:  Reported on 01/12/2021    [provider]  dicyclomine (BENTYL) 10 MG capsule Take 1 capsule (10 mg total) by mouth 4 (four) times daily -  before meals and at bedtime. 12/22/20   Karamalegos, Alexander J, DO  fluticasone (FLONASE) 50 MCG/ACT nasal spray PLACE 2 SPRAYS INTO BOTH NOSTRILS DAILY. USE FOR 4-6 WEEKS THEN STOP AND USE SEASONALLY OR AS NEEDED. 07/20/20   Parks Ranger, [name], DO  folic acid (FOLVITE) 1 MG tablet Take 1 tablet (1 mg total) by mouth daily. 01/12/21   Lin Landsman, MD  meclizine (ANTIVERT) 25 MG tablet Take 1 tablet (25 mg total) by mouth 3 (three) times daily as needed for dizziness or nausea. 06/24/20   Karamalegos, [name], DO  naltrexone (DEPADE) 50 MG tablet Take 1 tablet (50 mg total) by mouth daily. Increase to '100mg'$  daily after 1-2 weeks if needed Patient not taking: Reported on 01/12/2021 08/24/20   Olin Hauser, DO  omeprazole (PRILOSEC) 40 MG capsule TAKE 1 CAPSULE BY MOUTH TWICE A DAY 12/20/20   Karamalegos, [name], DO  ondansetron (ZOFRAN-ODT) 4 MG disintegrating tablet Take 1 tablet (4 mg total) by mouth every 8 (eight) hours as needed for nausea or vomiting. 12/22/20   Parks Ranger, [name], DO  sildenafil (REVATIO) 20 MG tablet Take 1-5 pills about 30 min prior to sex. Start with 1 and increase as needed. 08/06/20   Karamalegos, [name], DO  thiamine 100 MG tablet Take 1 tablet (100 mg total) by mouth daily. 01/12/21   Lin Landsman, MD  Vitamins/Minerals TABS Take by mouth.    [provider]    Allergies    Benazepril and Nsaids  Review of Systems   Review of Systems  Constitutional:  Positive for chills, fatigue, fever and unexpected weight change.  HENT:  Positive for congestion and rhinorrhea. Negative for facial swelling and sore throat.   Eyes:  Negative for visual disturbance.  Respiratory:  Positive for cough. Negative for shortness of breath.   Cardiovascular:  Negative for chest pain.   Gastrointestinal:  Negative for abdominal pain, nausea and vomiting.  Genitourinary:  Negative for enuresis.  Musculoskeletal:  Negative for back pain and neck pain.  Skin:  Negative for color change, pallor, rash and wound.  Neurological:  Positive for syncope and light-headedness. Negative for dizziness, tremors, seizures, facial asymmetry, speech difficulty, weakness, numbness and headaches.  Psychiatric/Behavioral:  Negative for confusion.    Physical Exam Updated Vital Signs BP (!) 162/94 (BP Location: Left Arm)   Pulse 98   Temp 98.1 F (36.7 C)   Resp 18   SpO2 96%   Physical Exam Vitals and nursing note reviewed.  Constitutional:      General: He is not in acute distress.    Appearance: He is not ill-appearing, toxic-appearing or diaphoretic.  HENT:     Head: Normocephalic and atraumatic. No raccoon eyes, Battle's sign, abrasion, contusion, masses, right periorbital erythema, left periorbital erythema or laceration.  Eyes:     General: No scleral icterus.       Right eye: No discharge.        Left eye: No discharge.     Extraocular Movements: Extraocular movements intact.     Conjunctiva/sclera: Conjunctivae normal.     Pupils: Pupils are equal, round, and reactive to light.  Cardiovascular:     Rate and Rhythm: Normal rate.  Pulmonary:     Effort: Pulmonary effort is normal.  Abdominal:     General: Abdomen is flat. Bowel sounds are normal. There is no distension. There are no signs of injury.     Palpations: Abdomen is soft. There is no mass or pulsatile mass.     Tenderness: There is no abdominal tenderness. There is no guarding or rebound.  Musculoskeletal:     Cervical back: Normal, normal range of motion and neck supple. No rigidity.     Thoracic back: No swelling, edema, deformity, signs of trauma, lacerations, spasms, tenderness or bony tenderness.     Lumbar back: No swelling, edema, deformity, signs of trauma, lacerations, spasms, tenderness or bony  tenderness.     Comments: No midline tenderness or deformity to cervical, thoracic, or lumbar spine  Skin:    General: Skin is warm and dry.  Neurological:     General: No focal deficit present.     Mental Status: He is alert.     GCS: GCS eye subscore is 4. GCS verbal subscore  is 5. GCS motor subscore is 6.     Cranial Nerves: No cranial nerve deficit or facial asymmetry.     Motor: No weakness, tremor, seizure activity or pronator drift.     Coordination: Romberg sign negative. Finger-Nose-Finger Test normal.     Comments: CN II-XII intact; performed in supine position due to recent syncope, +5 strength to bilateral upper extremities, +5 strength to dorsiflexion and plantarflexion, patient able to left both legs against gravity and hold each there without difficulty, decree sensation to light touch of right lower extremity (patient reports this is baseline for him)  Psychiatric:        Behavior: Behavior is cooperative.    ED Results / Procedures / Treatments   Labs (all labs ordered are listed, but only abnormal results are displayed) Labs Reviewed  CBC WITH DIFFERENTIAL/PLATELET - Abnormal; Notable for the following components:      Result Value   Basophils Absolute 0.2 (*)    All other components within normal limits  ETHANOL - Abnormal; Notable for the following components:   Alcohol, Ethyl (B) 358 (*)    All other components within normal limits  COMPREHENSIVE METABOLIC PANEL - Abnormal; Notable for the following components:   Potassium 3.4 (*)    Creatinine, Ser 0.59 (*)    Calcium 7.6 (*)    AST 123 (*)    ALT 71 (*)    All other components within normal limits  CBG MONITORING, ED - Abnormal; Notable for the following components:   Glucose-Capillary 100 (*)    All other components within normal limits  TROPONIN I (HIGH SENSITIVITY) - Abnormal; Notable for the following components:   Troponin I (High Sensitivity) 29 (*)    All other components within normal limits   TROPONIN I (HIGH SENSITIVITY) - Abnormal; Notable for the following components:   Troponin I (High Sensitivity) 37 (*)    All other components within normal limits  RESP PANEL BY RT-PCR (FLU A&B, COVID) [id number]  MAGNESIUM    EKG EKG Interpretation  Date/Time:  Saturday January 16 2021 11:46:26 EDT Ventricular Rate:  90 PR Interval:  159 QRS Duration: 105 QT Interval:  384 QTC Calculation: 470 R Axis:   -24 Text Interpretation: Sinus rhythm Left ventricular hypertrophy Borderline T abnormalities, inferior leads No significant change since last tracing Confirmed by Regan Lemming ([id number]) on 01/16/2021 3:10:00 PM  Radiology DG Chest Portable 1 View  Result Date: 01/16/2021 CLINICAL DATA:  Per EMS, patient from home, c/o 25 pound weight loss in 1 month with decreased PO intake. Fatigue x 3 days. Reports he consumes 5 drinks daily. One shot and one beer today. Hx of liver disease and anemia. EXAM: PORTABLE CHEST 1 VIEW COMPARISON:  02/17/2020 FINDINGS: Normal cardiac silhouette.  No mediastinal or hilar masses. Clear lungs.  No convincing pleural effusion or pneumothorax. Skeletal structures are grossly intact. IMPRESSION: No active disease. Electronically Signed   By: Lajean Manes M.D.   On: 01/16/2021 15:30    Procedures Procedures   Medications Ordered in ED Medications  LORazepam (ATIVAN) injection 0-4 mg (has no administration in time range)    Or  LORazepam (ATIVAN) tablet 0-4 mg (has no administration in time range)  LORazepam (ATIVAN) injection 0-4 mg (has no administration in time range)    Or  LORazepam (ATIVAN) tablet 0-4 mg (has no administration in time range)  thiamine tablet 100 mg (has no administration in time range)    Or  thiamine (B-1) injection 100 mg (  has no administration in time range)  sodium chloride 0.9 % bolus 1,000 mL (1,000 mLs Intravenous New Bag/Given 01/16/21 1439)    ED Course  I have reviewed the triage vital signs and the nursing  notes.  Pertinent labs & imaging results that were available during my care of the patient were reviewed by me and considered in my medical decision making (see chart for details).    MDM Rules/Calculators/A&P                           Will obtain 60 year old male in no acute distress, nontoxic appearing.  Presents with chief complaint of syncope, weight loss, and fatigue.  Reports 2 episodes of syncope at his house today.  Denies any falls associated with these episodes however glasses are broken.  Episodes were unwitnessed.  Patient had preceding lightheadedness.  No signs of obvious injury.  Neurologic exam reassuring.  Will obtain lab work, EKG, troponin, and orthostatic vital signs.  Patient reports that weight loss and fatigue has been present over the last month.  Reports losing approximate 25 pounds.  Patient has had decreased appetite over this time due to recurrent abdominal pain which she is currently following up with gastroenterology.  Patient reports that his primary care provider is aware of weight loss as well.  CMP shows transaminitis consistent with labs obtained 4 days prior, likely elevated in the setting of chronic alcohol use.   CBC is unremarkable.   Magnesium within normal limits  POC CBG 100  Ethanol elevated at 358, patient endorses alcohol use earlier this morning; patient appears clinically sober.    Troponin elevated at 29, repeat troponin increased to 37.  EKG shows normal sinus rhythm with no significant change from previous.  Patient continues to deny any chest pain.  Chest x-ray shows no active cardiopulmonary disease.    After initial elevated troponin patient was not given aspirin due to allergy.  Low suspicion for ACS at this time however due to patient's increased and upward trending troponin will consult hospitalist for cardiac observation.  1648 spoke to hospitalist Dr. Dione Plover  who agreed to see the patient for admission.  Final Clinical  Impression(s) / ED Diagnoses Final diagnoses:  Syncope, unspecified syncope type  Elevated troponin    Rx / DC Orders ED Discharge Orders     None        Dyann Ruddle 01/16/21 2244    Regan Lemming, MD 01/17/21 1226

## 2021-01-16 NOTE — ED Notes (Signed)
PA at bedside.

## 2021-01-16 NOTE — H&P (Signed)
[facility] History and Physical  Luis Porter Infinger [id number] DOB: 1961/04/24 DOA: 01/16/2021  Referring physician: Dr. Armandina Porter PCP: Luis Hauser, Porter   Chief Complaint: syncope  HPI: Luis Porter is a 60 y.o. male history of alcohol use disorder complicated by fatty liver and chronic gastritis, BPH, hypertension, who presents reporting 2 episodes of syncope.  Patient reports that this morning he stood up from his couch immediately felt dizzy, sweaty, and a little nauseous, and then passed out and fell onto his couch.  He tried to get up again and then had the same symptoms but did not pass out and sat back down.  He did not lose control of bowel or bladder, he did not feel confused afterwards.  This worried him enough to present for an evaluation.  He endorses daily drinking, usually around a sixpack of beer.  He is not planning on quitting though he knows his alcohol use is a problem.  He has also been feeling short of breath but that is not new today, this is a chronic problem has been going on for months.  He is also been losing weight and having chronic diarrhea for which she is following with GI.  He is not eating or drinking very well over the past few months.  He has a Advice worker, he does feel a little winded going up and down the steps but he does not have chest pain.  He would be interested in trying medication for his alcohol use disorder, though he notes that his wife is also a heavy drinker and this makes it very difficult to change.  In the ED vital signs notable only for very mild hypertension.  Lab work-up showed largely unremarkable CMP, AST and ALT mildly elevated but at patient's baseline, normal CBC, mildly elevated troponin of 29 that trended up to 37, and alcohol level of 358.  EKG showed sinus rhythm with borderline inferior lead T wave abnormalities but had no significant changes compared to prior.  Chest x-ray was unremarkable.  He has  not given any medications in the ED.  He was admitted for observation given his report of syncope and elevated cardiac enzymes.  Review of Systems:  Pertinent positives and negative per HPI, all others reviewed and negative  Past Medical History:  Diagnosis Date   Diverticulosis    Gastric ulcer    GERD (gastroesophageal reflux disease)    History of hiatal hernia    Hypertension    Iron deficiency anemia 10/18/2019   Recurrent umbilical hernia with incarceration 11/08/2016   Previously repaired x 1   Past Surgical History:  Procedure Laterality Date   BACK SURGERY     COLONOSCOPY WITH PROPOFOL N/A 12/30/2016   Procedure: COLONOSCOPY WITH PROPOFOL;  Surgeon: Luis Bellows, Porter;  Location: Luis [facility];  Service: Porter;  Laterality: N/A;   COLONOSCOPY WITH PROPOFOL N/A 10/31/2019   Procedure: COLONOSCOPY WITH PROPOFOL;  Surgeon: Luis Landsman, Porter;  Location: [facility] Porter [facility];  Service: Gastroenterology;  Laterality: N/A;   ESOPHAGOGASTRODUODENOSCOPY (EGD) WITH PROPOFOL N/A 12/30/2016   Procedure: ESOPHAGOGASTRODUODENOSCOPY (EGD) WITH PROPOFOL;  Surgeon: Luis Bellows, Porter;  Location: Luis [facility];  Service: Porter;  Laterality: N/A;   ESOPHAGOGASTRODUODENOSCOPY (EGD) WITH PROPOFOL N/A 10/31/2019   Procedure: ESOPHAGOGASTRODUODENOSCOPY (EGD) WITH PROPOFOL;  Surgeon: Luis Landsman, Porter;  Location: Luis [facility];  Service: Gastroenterology;  Laterality: N/A;   HERNIA REPAIR  [id number]   Umbilical Hernia Repair   LUMBAR LAMINECTOMY/DECOMPRESSION MICRODISCECTOMY Right 05/18/2016   Procedure: right  L4-5 microdiscectomy;  Surgeon: Luis East, Porter;  Location: Luis Porter;  Service: Neurosurgery;  Laterality: Right;   TONSILLECTOMY     UMBILICAL HERNIA REPAIR N/A 06/29/2018   Procedure: LAPAROSCOPIC REPAIR OF RECURRENT UMBILICAL HERNIA WITH MESH;  Surgeon: Luis Epley, Porter;  Location: Luis Porter;  Service: General;  Laterality: N/A;   Social History:  reports that he quit smoking  about 33 years ago. His smoking use included cigarettes. He has a 50.00 pack-year smoking history. He has never used smokeless tobacco. He reports that he does not currently use alcohol after a past usage of about 14.0 standard drinks per week. He reports that he does not use drugs.  Allergies  Allergen Reactions   Benazepril     Angioedema   Naltrexone Other (See Comments)    angry   Nsaids Other (See Comments)    Due to history of ulcers     Family History  Problem Relation Age of Onset   Hypertension Mother    Cancer Mother        Ovarian   Cervical cancer Mother    Hypertension Father    Cancer Father        tongue   Throat cancer Father    Hypertension Brother    Prostate cancer Neg Hx    Breast cancer Neg Hx    Colon cancer Neg Hx    Heart attack Neg Hx    Stroke Neg Hx      Prior to Admission medications   Medication Sig Start Date End Date Taking? Authorizing Provider  albuterol (VENTOLIN HFA) 108 (90 Base) MCG/ACT inhaler INHALE 1-2 PUFFS INTO THE LUNGS EVERY 4 (FOUR) HOURS AS NEEDED FOR WHEEZING OR SHORTNESS OF BREATH. 04/11/20  Yes Luis Porter  amLODipine (NORVASC) 10 MG tablet Take 1 tablet (10 mg total) by mouth daily. 09/01/20  Yes Luis Porter  fluticasone (FLONASE) 50 MCG/ACT nasal spray PLACE 2 SPRAYS INTO BOTH NOSTRILS DAILY. USE FOR 4-6 WEEKS THEN STOP AND USE SEASONALLY OR AS NEEDED. Patient taking differently: Place 2 sprays into both nostrils daily as needed for allergies. 07/20/20  Yes Luis Porter  folic acid (FOLVITE) 1 MG tablet Take 1 tablet (1 mg total) by mouth daily. 01/12/21  Yes Luis Porter  meclizine (ANTIVERT) 25 MG tablet Take 1 tablet (25 mg total) by mouth 3 (three) times daily as needed for dizziness or nausea. 06/24/20  Yes Luis Porter  omeprazole (PRILOSEC) 40 MG capsule TAKE 1 CAPSULE BY MOUTH TWICE A DAY Patient taking differently: Take 40 mg by mouth 2 (two)  times daily. 12/20/20  Yes Luis Porter  ondansetron (ZOFRAN-ODT) 4 MG disintegrating tablet Take 1 tablet (4 mg total) by mouth every 8 (eight) hours as needed for nausea or vomiting. 12/22/20  Yes Luis Porter  sildenafil (REVATIO) 20 MG tablet Take 1-5 pills about 30 min prior to sex. Start with 1 and increase as needed. Patient taking differently: Take 20-100 mg by mouth daily as needed (ED). taket30 min prior to sex. Start with 1  tablet ('20mg'$ ) and increase as needed. 08/06/20  Yes Luis Porter  thiamine 100 MG tablet Take 1 tablet (100 mg total) by mouth daily. 01/12/21  Yes Luis Porter  Vitamins/Minerals TABS Take 1 tablet by mouth daily.   Yes Provider, Historical, Porter  dicyclomine (BENTYL) 10 MG capsule Take 1 capsule (10 mg total) by mouth 4 (  four) times daily -  before meals and at bedtime. 12/22/20   Luis Porter  naltrexone (DEPADE) 50 MG tablet Take 1 tablet (50 mg total) by mouth daily. Increase to '100mg'$  daily after 1-2 weeks if needed Patient not taking: No sig reported 08/24/20   Luis Hauser, Porter   Physical Exam: Vitals:   01/16/21 1500 01/16/21 1609 01/16/21 1704 01/16/21 1725  BP: 136/83 (!) 145/86 137/81 137/81  Pulse: 83 83 89 89  Resp: '18 18 18   '$ Temp:      SpO2: 92% 94% 90%     Wt Readings from Last 3 Encounters:  01/12/21 67.1 kg  12/22/20 72.6 kg  11/11/20 72.6 kg     General:  Appears calm and comfortable Eyes: PERRL, normal lids, irises & conjunctiva ENT: grossly normal hearing, lips & tongue Neck: no masses Cardiovascular: RRR, no m/r/g. No LE edema. Telemetry: SR, no arrhythmias  Respiratory: CTA bilaterally, no w/r/r. Normal respiratory effort. Abdomen: soft, ntnd Skin: no rash or induration seen on limited exam Musculoskeletal: grossly normal tone BUE/BLE Psychiatric: grossly normal mood and affect, speech fluent and appropriate Neurologic: grossly non-focal.           Labs on Admission:  Basic Metabolic Panel: Recent Labs  Lab 01/16/21 1202 01/16/21 1235  NA  --  141  K  --  3.4*  CL  --  106  CO2  --  22  GLUCOSE  --  85  BUN  --  6  CREATININE  --  0.59*  CALCIUM  --  7.6*  MG 2.0  --    Liver Function Tests: Recent Labs  Lab 01/12/21 1034 01/16/21 1235  AST 125* 123*  ALT 68* 71*  ALKPHOS 96 56  BILITOT 0.5 0.8  PROT 7.4 6.6  ALBUMIN 4.6 3.5   No results for input(s): LIPASE, AMYLASE in the last 168 hours. No results for input(s): AMMONIA in the last 168 hours. CBC: Recent Labs  Lab 01/12/21 1034 01/16/21 1202  WBC 5.4 4.8  NEUTROABS  --  2.2  HGB 16.1 15.6  HCT 47.1 43.8  MCV 94 91.8  PLT 165 179   Cardiac Enzymes: No results for input(s): CKTOTAL, CKMB, CKMBINDEX, TROPONINI in the last 168 hours.  BNP (last 3 results) No results for input(s): BNP in the last 8760 hours.  ProBNP (last 3 results) No results for input(s): PROBNP in the last 8760 hours.  CBG: Recent Labs  Lab 01/16/21 1202  GLUCAP 100*    Radiological Exams on Admission: DG Chest Portable 1 View  Result Date: 01/16/2021 CLINICAL DATA:  Per EMS, patient from home, c/o 25 pound weight loss in 1 month with decreased PO intake. Fatigue x 3 days. Reports he consumes 5 drinks daily. One shot and one beer today. Hx of liver disease and anemia. EXAM: PORTABLE CHEST 1 VIEW COMPARISON:  02/17/2020 FINDINGS: Normal cardiac silhouette.  No mediastinal or hilar masses. Clear lungs.  No convincing pleural effusion or pneumothorax. Skeletal structures are grossly intact. IMPRESSION: No active disease. Electronically Signed   By: Lajean Manes M.D.   On: 01/16/2021 15:30    EKG: Independently reviewed.  Sinus rhythm, T wave flattening in the inferior leads, compared to prior there are no significant changes.  Assessment/Plan Active Problems:   Essential hypertension   BPH with obstruction/lower urinary tract symptoms   Alcohol abuse, daily use   Alcohol  withdrawal syndrome without complication (HCC)   Alcohol induced fatty liver  Chronic alcoholic gastritis without hemorrhage   Elevated troponin   #Syncope Syncope history consistent with vasovagal, especially given additional history of poor p.o. intake and chronic diarrhea.  Very low suspicion for cardiac etiology.  #Elevated troponin Likely secondary to chronic alcohol use and elevated alcohol level of 358 on admission.  On review of literature there are numerous studies and case reports showing an association between elevated high-sensitivity troponin values and alcohol use.  Very low suspicion that syncope or primary cardiac event is the cause.  EKG is unchanged from prior and he has no classic anginal symptoms. - Monitor on telemetry - Trend troponin to peak - Give 1 full dose aspirin  #Alcohol use disorder Patient aware that alcohol use is problematic but does not feel ready to stop.  Per review of chart has trialed naltrexone in the past but this made him "angry".  May be a candidate for acamprosate. - CIWA protocol - Scheduled Ativan per protocol - Vitamin supplementation  #Chronic medical problems Hypertension-continue amlodipine Chronic gastritis-continue PPI  Code Status: Full code DVT Prophylaxis: Lovenox Family Communication: None Disposition Plan: Observation, telemetry  Time spent: 50 min  Clarnce Flock Porter/[facility]  Note:  This document was prepared using Systems analyst and may include unintentional dictation errors.

## 2021-01-16 NOTE — ED Triage Notes (Signed)
Per EMS, patient from home, c/o 25 pound weight loss in 1 month with decreased PO intake. Fatigue x3 days. Reports he consumes 5 drinks daily. One shot and one beer today. Hx liver disease and anemia.

## 2021-01-16 NOTE — ED Notes (Signed)
PA and primary RN aware critical ETOH 358.

## 2021-01-16 NOTE — Plan of Care (Signed)
  Problem: Education: Goal: Knowledge of disease or condition will improve Outcome: Progressing Goal: Understanding of discharge needs will improve Outcome: Progressing   Problem: Health Behavior/Discharge Planning: Goal: Ability to identify changes in lifestyle to reduce recurrence of condition will improve Outcome: Progressing Goal: Identification of resources available to assist in meeting health care needs will improve Outcome: Progressing   Problem: Physical Regulation: Goal: Complications related to the disease process, condition or treatment will be avoided or minimized Outcome: Progressing   Problem: Safety: Goal: Ability to remain free from injury will improve Outcome: Progressing   Problem: Education: Goal: Knowledge of General Education information will improve Description: Including pain rating scale, medication(s)/side effects and non-pharmacologic comfort measures Outcome: Progressing   Problem: Health Behavior/Discharge Planning: Goal: Ability to manage health-related needs will improve Outcome: Progressing   Problem: Clinical Measurements: Goal: Ability to maintain clinical measurements within normal limits will improve Outcome: Progressing Goal: Diagnostic test results will improve Outcome: Progressing Goal: Respiratory complications will improve Outcome: Progressing   Problem: Activity: Goal: Risk for activity intolerance will decrease Outcome: Progressing

## 2021-01-16 NOTE — ED Notes (Signed)
Attempted to call report to 4E for IP handoff with no answer

## 2021-01-17 DIAGNOSIS — K2920 Alcoholic gastritis without bleeding: Secondary | ICD-10-CM | POA: Diagnosis not present

## 2021-01-17 DIAGNOSIS — F1010 Alcohol abuse, uncomplicated: Secondary | ICD-10-CM | POA: Diagnosis not present

## 2021-01-17 DIAGNOSIS — R55 Syncope and collapse: Secondary | ICD-10-CM | POA: Diagnosis not present

## 2021-01-17 LAB — COMPREHENSIVE METABOLIC PANEL
ALT: 79 U/L — ABNORMAL HIGH (ref 0–44)
AST: 130 U/L — ABNORMAL HIGH (ref 15–41)
Albumin: 4.2 g/dL (ref 3.5–5.0)
Alkaline Phosphatase: 68 U/L (ref 38–126)
Anion gap: 11 (ref 5–15)
BUN: 8 mg/dL (ref 6–20)
CO2: 27 mmol/L (ref 22–32)
Calcium: 9.3 mg/dL (ref 8.9–10.3)
Chloride: 100 mmol/L (ref 98–111)
Creatinine, Ser: 0.54 mg/dL — ABNORMAL LOW (ref 0.61–1.24)
GFR, Estimated: >60 mL/min (ref >60)
Glucose, Bld: 108 mg/dL — ABNORMAL HIGH (ref 70–99)
Potassium: 3.1 mmol/L — ABNORMAL LOW (ref 3.5–5.1)
Sodium: 138 mmol/L (ref 135–145)
Total Bilirubin: 1.3 mg/dL — ABNORMAL HIGH (ref 0.3–1.2)
Total Protein: 7.7 g/dL (ref 6.5–8.1)

## 2021-01-17 LAB — CBC
HCT: 45.2 % (ref 39.0–52.0)
Hemoglobin: 15.9 g/dL (ref 13.0–17.0)
MCH: 33 pg (ref 26.0–34.0)
MCHC: 35.2 g/dL (ref 30.0–36.0)
MCV: 93.8 fL (ref 80.0–100.0)
Platelets: 180 10*3/uL (ref 150–400)
RBC: 4.82 MIL/uL (ref 4.22–5.81)
RDW: 15.7 % — ABNORMAL HIGH (ref 11.5–15.5)
WBC: 4.4 10*3/uL (ref 4.0–10.5)
nRBC: 0 % (ref 0.0–0.2)

## 2021-01-17 LAB — HIV ANTIBODY (ROUTINE TESTING W REFLEX): HIV Screen 4th Generation wRfx: NONREACTIVE

## 2021-01-17 LAB — PHOSPHORUS: Phosphorus: 3.1 mg/dL (ref 2.5–4.6)

## 2021-01-17 LAB — MAGNESIUM: Magnesium: 1.8 mg/dL (ref 1.7–2.4)

## 2021-01-17 MED ORDER — LORAZEPAM 1 MG PO TABS
1.0000 mg | ORAL_TABLET | ORAL | Status: DC | PRN
  Administered 2021-01-17 – 2021-01-18 (×2): 2 mg via ORAL
  Filled 2021-01-17: qty 2

## 2021-01-17 MED ORDER — LORAZEPAM 2 MG/ML IJ SOLN
1.0000 mg | INTRAMUSCULAR | Status: DC | PRN
  Administered 2021-01-17 – 2021-01-18 (×3): 2 mg via INTRAVENOUS
  Filled 2021-01-17 (×2): qty 1

## 2021-01-17 MED ORDER — POTASSIUM CHLORIDE CRYS ER 20 MEQ PO TBCR
40.0000 meq | EXTENDED_RELEASE_TABLET | ORAL | Status: AC
Start: 2021-01-17 — End: 2021-01-17
  Administered 2021-01-17 (×2): 40 meq via ORAL
  Filled 2021-01-17 (×2): qty 2

## 2021-01-17 NOTE — Progress Notes (Signed)
[facility]  PROGRESS NOTE  Luis Porter [id number] DOB: 1960-07-14 DOA: 01/16/2021 PCP: Olin Hauser, DO   Brief HPI:   60 year old male with history of alcohol use disorder, complicated by fatty liver, chronic gastritis, BPH, hypertension presented with 2 episodes of syncope.  In the ED lab work showed mild elevation of AST and ALT, alcohol level 358.  EKG showed sinus rhythm with borderline inferior lead T wave abnormalities but no significant changes.    Subjective   Patient seen and examined, no more episodes of syncope in the hospital.  Complains of tremors in the hands and also wobbly on feet.   Assessment/Plan:    Syncope -Resolved -Likely vasovagal; telemetry showed normal sinus rhythm -EKG showed borderline T wave abnormalities in leads II, III and aVF -Troponin was elevated at 37, 30, 28 -Will obtain echocardiogram  Alcohol withdrawal -Patient having tremors in upper extremities -Started on Ativan per CIWA protocol -Last CIWA score was 3 -Continue thiamine, folic acid  Hypokalemia -Potassium is 3.1 -Replace potassium and follow BMP in am  Hypertension -Continue amlodipine  Chronic gastritis -Continue pantoprazole   Scheduled medications:    amLODipine  10 mg Oral Daily   aspirin  324 mg Oral Once   enoxaparin (LOVENOX) injection  40 mg Subcutaneous [id number]   folic acid  1 mg Oral Daily   LORazepam  0-4 mg Intravenous Q6H   Or   LORazepam  0-4 mg Oral Q6H   multivitamin with minerals  1 tablet Oral Daily   pantoprazole  80 mg Oral Daily   thiamine  100 mg Oral Daily   Or   thiamine  100 mg Intravenous Daily         Data Reviewed:   CBG:  Recent Labs  Lab 01/16/21 1202  GLUCAP 100*    SpO2: 92 %    Vitals:   01/17/21 1050 01/17/21 1100 01/17/21 1304 01/17/21 1724  BP:  (!) 145/89 (!) 144/97 (!) 139/93  Pulse: 93  (!) 101 95  Resp:   18 19  Temp:   97.7 F (36.5 C) 98.1 F (36.7 C)  TempSrc:    Oral   SpO2:   94% 92%  Weight:      Height:         Intake/Output Summary (Last 24 hours) at 01/17/2021 1738 Last data filed at 01/17/2021 1425 Gross per 24 hour  Intake 360 ml  Output --  Net 360 ml    No intake/output data recorded.  Filed Weights   01/16/21 2300  Weight: 68.9 kg    CBC:  Recent Labs  Lab 01/12/21 1034 01/16/21 1202 01/17/21 0248  WBC 5.4 4.8 4.4  HGB 16.1 15.6 15.9  HCT 47.1 43.8 45.2  PLT 165 179 180  MCV 94 91.8 93.8  MCH 32.0 32.7 33.0  MCHC 34.2 35.6 35.2  RDW 15.4 15.2 15.7*  LYMPHSABS  --  1.7  --   MONOABS  --  0.7  --   EOSABS  --  0.1  --   BASOSABS  --  0.2*  --     Complete metabolic panel:  Recent Labs  Lab 01/12/21 1034 01/16/21 1202 01/16/21 1235 01/17/21 0248  NA  --   --  141 138  K  --   --  3.4* 3.1*  CL  --   --  106 100  CO2  --   --  22 27  GLUCOSE  --   --  85 108*  BUN  --   --  6 8  CREATININE  --   --  0.59* 0.54*  CALCIUM  --   --  7.6* 9.3  AST 125*  --  123* 130*  ALT 68*  --  71* 79*  ALKPHOS 96  --  56 68  BILITOT 0.5  --  0.8 1.3*  ALBUMIN 4.6  --  3.5 4.2  MG  --  2.0  --  1.8    No results for input(s): LIPASE, AMYLASE in the last 168 hours.  Recent Labs  Lab 01/16/21 1741  SARSCOV2NAA NEGATIVE    ------------------------------------------------------------------------------------------------------------------ No results for input(s): CHOL, HDL, LDLCALC, TRIG, CHOLHDL, LDLDIRECT in the last 72 hours.  Lab Results  Component Value Date   HGBA1C 5.2 11/14/2016   ------------------------------------------------------------------------------------------------------------------ No results for input(s): TSH, T4TOTAL, T3FREE, THYROIDAB in the last 72 hours.  Invalid input(s): FREET3 ------------------------------------------------------------------------------------------------------------------ No results for input(s): VITAMINB12, FOLATE, FERRITIN, TIBC, IRON, RETICCTPCT in the last 72  hours.  Coagulation profile No results for input(s): INR, PROTIME in the last 168 hours. No results for input(s): DDIMER in the last 72 hours.  Cardiac Enzymes No results for input(s): CKTOTAL, CKMB, CKMBINDEX, TROPONINI in the last 168 hours.  ------------------------------------------------------------------------------------------------------------------ No results found for: BNP   Antibiotics: Anti-infectives (From admission, onward)    None        Radiology Reports  DG Chest Portable 1 View  Result Date: 01/16/2021 CLINICAL DATA:  Per EMS, patient from home, c/o 25 pound weight loss in 1 month with decreased PO intake. Fatigue x 3 days. Reports he consumes 5 drinks daily. One shot and one beer today. Hx of liver disease and anemia. EXAM: PORTABLE CHEST 1 VIEW COMPARISON:  02/17/2020 FINDINGS: Normal cardiac silhouette.  No mediastinal or hilar masses. Clear lungs.  No convincing pleural effusion or pneumothorax. Skeletal structures are grossly intact. IMPRESSION: No active disease. Electronically Signed   By: Lajean Manes M.D.   On: 01/16/2021 15:30      DVT prophylaxis: Lovenox  Code Status: Full code  Family Communication: No family at bedside   Consultants:   Procedures:     Objective    Physical Examination:   General-appears in no acute distress Heart-S1-S2, regular, no murmur auscultated Lungs-clear to auscultation bilaterally, no wheezing or crackles auscultated Abdomen-soft, nontender, no organomegaly Extremities-no edema in the lower extremities Neuro-alert, oriented x3, no focal deficit noted  Status is: Inpatient  Dispo: The patient is from: Home              Anticipated d/c is to: Home versus skilled nursing facility              Anticipated d/c date is: 01/19/2021              Patient currently not stable for discharge  Barrier to discharge-alcohol withdrawal  COVID-19 Labs  No results for input(s): DDIMER, FERRITIN, LDH, CRP in  the last 72 hours.  Lab Results  Component Value Date   SARSCOV2NAA NEGATIVE 01/16/2021   SARSCOV2NAA NEGATIVE 07/28/2020   [place] Not Detected 06/18/2020   SARSCOV2NAA POSITIVE (A) 02/17/2020    Microbiology  Recent Results (from the past 240 hour(s))  Resp Panel by RT-PCR (Flu A&B, Covid) Nasopharyngeal Swab     Status: None   Collection Time: 01/16/21  5:41 PM   Specimen: Nasopharyngeal Swab; Nasopharyngeal(NP) swabs in vial transport medium  Result Value Ref Range Status   SARS Coronavirus 2 by RT PCR NEGATIVE NEGATIVE Final  Comment: (NOTE) SARS-CoV-2 target nucleic acids are NOT DETECTED.  The SARS-CoV-2 RNA is generally detectable in upper respiratory specimens during the acute phase of infection. The lowest concentration of SARS-CoV-2 viral copies this assay can detect is 138 copies/mL. A negative result does not preclude SARS-Cov-2 infection and should not be used as the sole basis for treatment or other patient management decisions. A negative result may occur with  improper specimen collection/handling, submission of specimen other than nasopharyngeal swab, presence of viral mutation(s) within the areas targeted by this assay, and inadequate number of viral copies(<138 copies/mL). A negative result must be combined with clinical observations, patient history, and epidemiological information. The expected result is Negative.  Fact Sheet for Patients:  EntrepreneurPulse.com.au  Fact Sheet for Healthcare Providers:  IncredibleEmployment.be  This test is no t yet approved or cleared by the [place] FDA and  has been authorized for detection and/or diagnosis of SARS-CoV-2 by FDA under an Emergency Use Authorization (EUA). This EUA will remain  in effect (meaning this test can be used) for the duration of the COVID-19 declaration under Section 564(b)(1) of the Act, 21 U.S.C.section 360bbb-3(b)(1), unless the  authorization is terminated  or revoked sooner.       Influenza A by PCR NEGATIVE NEGATIVE Final   Influenza B by PCR NEGATIVE NEGATIVE Final    Comment: (NOTE) The Xpert Xpress SARS-CoV-2/FLU/RSV plus assay is intended as an aid in the diagnosis of influenza from Nasopharyngeal swab specimens and should not be used as a sole basis for treatment. Nasal washings and aspirates are unacceptable for Xpert Xpress SARS-CoV-2/FLU/RSV testing.  Fact Sheet for Patients: EntrepreneurPulse.com.au  Fact Sheet for Healthcare Providers: IncredibleEmployment.be  This test is not yet approved or cleared by the [place] FDA and has been authorized for detection and/or diagnosis of SARS-CoV-2 by FDA under an Emergency Use Authorization (EUA). This EUA will remain in effect (meaning this test can be used) for the duration of the COVID-19 declaration under Section 564(b)(1) of the Act, 21 U.S.C. section 360bbb-3(b)(1), unless the authorization is terminated or revoked.  Performed at [facility], [place] [address], [place], [place] [address]              Oswald Hillock   [facility] If 7PM-7AM, please contact night-coverage at [email or web address], Office  [phone]   01/17/2021, 5:38 PM  LOS: 0 days

## 2021-01-17 NOTE — Plan of Care (Signed)
  Problem: Education: Goal: Knowledge of disease or condition will improve Outcome: Progressing Goal: Understanding of discharge needs will improve Outcome: Progressing   Problem: Safety: Goal: Ability to remain free from injury will improve Outcome: Progressing   Problem: Health Behavior/Discharge Planning: Goal: Ability to manage health-related needs will improve Outcome: Progressing   Problem: Clinical Measurements: Goal: Respiratory complications will improve Outcome: Progressing   Problem: Activity: Goal: Risk for activity intolerance will decrease Outcome: Progressing

## 2021-01-18 ENCOUNTER — Observation Stay (HOSPITAL_COMMUNITY): Payer: Commercial Managed Care - PPO

## 2021-01-18 DIAGNOSIS — I10 Essential (primary) hypertension: Secondary | ICD-10-CM | POA: Diagnosis not present

## 2021-01-18 DIAGNOSIS — K700 Alcoholic fatty liver: Secondary | ICD-10-CM | POA: Diagnosis present

## 2021-01-18 DIAGNOSIS — R55 Syncope and collapse: Secondary | ICD-10-CM

## 2021-01-18 DIAGNOSIS — I426 Alcoholic cardiomyopathy: Secondary | ICD-10-CM | POA: Diagnosis present

## 2021-01-18 DIAGNOSIS — N401 Enlarged prostate with lower urinary tract symptoms: Secondary | ICD-10-CM | POA: Diagnosis present

## 2021-01-18 DIAGNOSIS — R634 Abnormal weight loss: Secondary | ICD-10-CM | POA: Diagnosis present

## 2021-01-18 DIAGNOSIS — N138 Other obstructive and reflux uropathy: Secondary | ICD-10-CM | POA: Diagnosis present

## 2021-01-18 DIAGNOSIS — Z20822 Contact with and (suspected) exposure to covid-19: Secondary | ICD-10-CM | POA: Diagnosis present

## 2021-01-18 DIAGNOSIS — Z886 Allergy status to analgesic agent status: Secondary | ICD-10-CM | POA: Diagnosis not present

## 2021-01-18 DIAGNOSIS — Z79899 Other long term (current) drug therapy: Secondary | ICD-10-CM | POA: Diagnosis not present

## 2021-01-18 DIAGNOSIS — I5043 Acute on chronic combined systolic (congestive) and diastolic (congestive) heart failure: Secondary | ICD-10-CM | POA: Diagnosis not present

## 2021-01-18 DIAGNOSIS — F10231 Alcohol dependence with withdrawal delirium: Secondary | ICD-10-CM | POA: Diagnosis not present

## 2021-01-18 DIAGNOSIS — E876 Hypokalemia: Secondary | ICD-10-CM | POA: Diagnosis present

## 2021-01-18 DIAGNOSIS — Z6823 Body mass index (BMI) 23.0-23.9, adult: Secondary | ICD-10-CM | POA: Diagnosis not present

## 2021-01-18 DIAGNOSIS — F1010 Alcohol abuse, uncomplicated: Secondary | ICD-10-CM | POA: Diagnosis not present

## 2021-01-18 DIAGNOSIS — R778 Other specified abnormalities of plasma proteins: Secondary | ICD-10-CM | POA: Diagnosis present

## 2021-01-18 DIAGNOSIS — I5021 Acute systolic (congestive) heart failure: Secondary | ICD-10-CM | POA: Diagnosis not present

## 2021-01-18 DIAGNOSIS — K219 Gastro-esophageal reflux disease without esophagitis: Secondary | ICD-10-CM | POA: Diagnosis present

## 2021-01-18 DIAGNOSIS — F10230 Alcohol dependence with withdrawal, uncomplicated: Secondary | ICD-10-CM | POA: Diagnosis not present

## 2021-01-18 DIAGNOSIS — Z87891 Personal history of nicotine dependence: Secondary | ICD-10-CM | POA: Diagnosis not present

## 2021-01-18 DIAGNOSIS — I420 Dilated cardiomyopathy: Secondary | ICD-10-CM | POA: Diagnosis present

## 2021-01-18 DIAGNOSIS — Z888 Allergy status to other drugs, medicaments and biological substances status: Secondary | ICD-10-CM | POA: Diagnosis not present

## 2021-01-18 DIAGNOSIS — I5041 Acute combined systolic (congestive) and diastolic (congestive) heart failure: Secondary | ICD-10-CM | POA: Diagnosis present

## 2021-01-18 DIAGNOSIS — I110 Hypertensive heart disease with heart failure: Secondary | ICD-10-CM | POA: Diagnosis present

## 2021-01-18 DIAGNOSIS — Y908 Blood alcohol level of 240 mg/100 ml or more: Secondary | ICD-10-CM | POA: Diagnosis present

## 2021-01-18 DIAGNOSIS — R443 Hallucinations, unspecified: Secondary | ICD-10-CM | POA: Diagnosis not present

## 2021-01-18 DIAGNOSIS — K2920 Alcoholic gastritis without bleeding: Secondary | ICD-10-CM | POA: Diagnosis present

## 2021-01-18 DIAGNOSIS — Z8249 Family history of ischemic heart disease and other diseases of the circulatory system: Secondary | ICD-10-CM | POA: Diagnosis not present

## 2021-01-18 LAB — COMPREHENSIVE METABOLIC PANEL
ALT: 63 U/L — ABNORMAL HIGH (ref 0–44)
AST: 83 U/L — ABNORMAL HIGH (ref 15–41)
Albumin: 3.9 g/dL (ref 3.5–5.0)
Alkaline Phosphatase: 65 U/L (ref 38–126)
Anion gap: 10 (ref 5–15)
BUN: 8 mg/dL (ref 6–20)
CO2: 23 mmol/L (ref 22–32)
Calcium: 9.3 mg/dL (ref 8.9–10.3)
Chloride: 102 mmol/L (ref 98–111)
Creatinine, Ser: 0.53 mg/dL — ABNORMAL LOW (ref 0.61–1.24)
GFR, Estimated: >60 mL/min (ref >60)
Glucose, Bld: 102 mg/dL — ABNORMAL HIGH (ref 70–99)
Potassium: 3.1 mmol/L — ABNORMAL LOW (ref 3.5–5.1)
Sodium: 135 mmol/L (ref 135–145)
Total Bilirubin: 1.1 mg/dL (ref 0.3–1.2)
Total Protein: 7.4 g/dL (ref 6.5–8.1)

## 2021-01-18 LAB — ECHOCARDIOGRAM COMPLETE
AR max vel: 3.99 cm2
AV Area VTI: 4.22 cm2
AV Area mean vel: 3.96 cm2
AV Mean grad: 2 mmHg
AV Peak grad: 2.8 mmHg
Ao pk vel: 0.84 m/s
Area-P 1/2: 8.25 cm2
Calc EF: 16.8 %
Height: 66 in
S' Lateral: 4.4 cm
Single Plane A2C EF: 23.1 %
Single Plane A4C EF: 5.4 %
Weight: 2349.22 oz

## 2021-01-18 MED ORDER — CHLORDIAZEPOXIDE HCL 5 MG PO CAPS
25.0000 mg | ORAL_CAPSULE | Freq: Every day | ORAL | Status: DC
  Filled 2021-01-18: qty 5

## 2021-01-18 MED ORDER — MAGNESIUM SULFATE IN D5W 1-5 GM/100ML-% IV SOLN
1.0000 g | Freq: Once | INTRAVENOUS | Status: DC
  Filled 2021-01-18: qty 100

## 2021-01-18 MED ORDER — POTASSIUM CHLORIDE CRYS ER 20 MEQ PO TBCR
40.0000 meq | EXTENDED_RELEASE_TABLET | ORAL | Status: AC
Start: 2021-01-18 — End: 2021-01-19
  Administered 2021-01-18 (×2): 40 meq via ORAL
  Filled 2021-01-18 (×2): qty 2

## 2021-01-18 MED ORDER — LOPERAMIDE HCL 2 MG PO CAPS: 2.0000 mg | ORAL_CAPSULE | ORAL | Status: AC | PRN

## 2021-01-18 MED ORDER — MAGNESIUM OXIDE -MG SUPPLEMENT 400 (240 MG) MG PO TABS
400.0000 mg | ORAL_TABLET | Freq: Two times a day (BID) | ORAL | Status: AC
  Administered 2021-01-18 – 2021-01-19 (×4): 400 mg via ORAL
  Filled 2021-01-18 (×4): qty 1

## 2021-01-18 MED ORDER — HYDROXYZINE HCL 25 MG PO TABS: 25.0000 mg | ORAL_TABLET | Freq: Four times a day (QID) | ORAL | Status: AC | PRN

## 2021-01-18 MED ORDER — DIAZEPAM 5 MG/ML IJ SOLN
5.0000 mg | Freq: Once | INTRAMUSCULAR | Status: AC
  Administered 2021-01-18: 5 mg via INTRAMUSCULAR
  Filled 2021-01-18: qty 2

## 2021-01-18 MED ORDER — POTASSIUM CHLORIDE 10 MEQ/100ML IV SOLN: 10.0000 meq | INTRAVENOUS | Status: DC

## 2021-01-18 MED ORDER — CHLORDIAZEPOXIDE HCL 5 MG PO CAPS
25.0000 mg | ORAL_CAPSULE | ORAL | Status: AC
  Administered 2021-01-20 – 2021-01-21 (×3): 25 mg via ORAL
  Filled 2021-01-18 (×2): qty 5

## 2021-01-18 MED ORDER — CHLORDIAZEPOXIDE HCL 5 MG PO CAPS
25.0000 mg | ORAL_CAPSULE | Freq: Four times a day (QID) | ORAL | Status: AC
  Administered 2021-01-18 – 2021-01-19 (×2): 25 mg via ORAL
  Filled 2021-01-18 (×2): qty 5

## 2021-01-18 MED ORDER — CHLORDIAZEPOXIDE HCL 5 MG PO CAPS
25.0000 mg | ORAL_CAPSULE | Freq: Three times a day (TID) | ORAL | Status: AC
  Administered 2021-01-19 – 2021-01-20 (×3): 25 mg via ORAL
  Filled 2021-01-18 (×3): qty 5

## 2021-01-18 MED ORDER — CHLORDIAZEPOXIDE HCL 5 MG PO CAPS
25.0000 mg | ORAL_CAPSULE | Freq: Four times a day (QID) | ORAL | Status: AC | PRN
  Administered 2021-01-18: 25 mg via ORAL
  Filled 2021-01-18: qty 5

## 2021-01-18 NOTE — TOC Progression Note (Signed)
Transition of Care [facility]) - Progression Note    Patient Details  Name: Luis Porter MRN: [id number] Date of Birth: 02/23/1961  [facility]) CM/SW Contact  Floyce Bujak, Juliann Pulse, RN Phone Number: 01/18/2021, 3:46 PM  Clinical Narrative: IVC paperwork in shadow chart-called sheriff to serve the patient. Also contact his s/o [facility] aware.      Expected Discharge Plan: IP Rehab Facility Barriers to Discharge: Active Substance Use - Placement  Expected Discharge Plan and Services Expected Discharge Plan: [place]   Discharge Planning Services: CM Consult   Living arrangements for the past 2 months: Apartment                                       Social Determinants of Health (SDOH) Interventions Food Insecurity Interventions: Intervention Not Indicated Financial Strain Interventions: Intervention Not Indicated Housing Interventions: Intervention Not Indicated Intimate Partner Violence Interventions: Intervention Not Indicated Stress Interventions: Intervention Not Indicated Social Connections Interventions: Intervention Not Indicated Transportation Interventions: Intervention Not Indicated  Readmission Risk Interventions No flowsheet data found.

## 2021-01-18 NOTE — Evaluation (Signed)
Physical Therapy Evaluation-1x Patient Details Name: Luis Porter MRN: [id number] DOB: 28-Aug-1960 Today's Date: 01/18/2021   History of Present Illness  60 yo male admitted essential HTN, confusion, syncope, ETOH withdrawal. Hx of ETOH abuse, BPH, chronic gastritis, back sg-L4L5 microdiscetomy 2017  Clinical Impression  Pt up OOB in room with alarm sounding. Introduced myself to pt and explained my role. Pt fixated on leaving hospital and going home. He declined to work with me. Observed pt gather items around room, don shorts and sandals. Nursing arrived and asked pt to return to bed. He briefly sat but then was up again and attempting to walk out into hallway. Nursing back in to care for pt and allowed him to walk in hallway with a walker (NT walking with pt). Pt continuing to try to leave. At this time, PT will sign off. Pt is mobilizing with supervision level assist 2* confusion. Recommend daily ambulation with nursing supervision/assistance. Will sign off. 1x eval.     Follow Up Recommendations Supervision/Assistance - 24 hour (unsure of assist available at home-pt reports he is married and that wife can assist as needed)    Equipment Recommendations   (RW if pt doesn't already have one)    Recommendations for Other Services       Precautions / Restrictions Precautions Precautions: Fall Restrictions Weight Bearing Restrictions: No      Mobility  Bed Mobility Overal bed mobility: Modified Independent                  Transfers Overall transfer level: Modified independent                  Ambulation/Gait Ambulation/Gait assistance: Supervision level (Device/Increase time)     Gait Pattern/deviations: Step-through pattern;Decreased stride length     General Gait Details: walked around room without device-unsteady but no overt LOB. Observed him don shorts and sandals. Also observed pt ambulating in hallway with RW with nursing attempting to get him  back into his room. He is attempting to go home. Overall, supervision level for safety reasons.  Stairs            Wheelchair Mobility    Modified Rankin (Stroke Patients Only)       Balance                                             Pertinent Vitals/Pain Pain Assessment: No/denies pain    Home Living Family/patient expects to be discharged to:: Unsure                      Prior Function Level of Independence: Independent         Comments: Independent per pt report. No family present to confirm     Hand Dominance        Extremity/Trunk Assessment   Upper Extremity Assessment Upper Extremity Assessment: Overall WFL for tasks assessed    Lower Extremity Assessment Lower Extremity Assessment: Generalized weakness    Cervical / Trunk Assessment Cervical / Trunk Assessment: Normal  Communication   Communication: No difficulties  Cognition Arousal/Alertness: Awake/alert Behavior During Therapy: Restless Overall Cognitive Status: No family/caregiver present to determine baseline cognitive functioning  General Comments: pt has poor insight. attempting to leave before discharged. he was able A&O x 2. poor safety awareness      General Comments      Exercises     Assessment/Plan    PT Assessment Patent does not need any further PT services  PT Problem List         PT Treatment Interventions      PT Goals (Current goals can be found in the Care Plan section)  Acute Rehab PT Goals Patient Stated Goal: home. pt trying to leave hospital PT Goal Formulation: All assessment and education complete, DC therapy    Frequency     Barriers to discharge        Co-evaluation               AM-PAC PT "6 Clicks" Mobility  Outcome Measure Help needed turning from your back to your side while in a flat bed without using bedrails?: None Help needed moving from lying on your  back to sitting on the side of a flat bed without using bedrails?: None Help needed moving to and from a bed to a chair (including a wheelchair)?: A Little Help needed standing up from a chair using your arms (e.g., wheelchair or bedside chair)?: None Help needed to walk in hospital room?: A Little Help needed climbing 3-5 steps with a railing? : A Little 6 Click Score: 21    End of Session     Patient left:  (sitting EOB with nursing arriving to room. Pt up again shortly thereafter-will not remain in bed)        Time: [id number] PT Time Calculation (min) (ACUTE ONLY): 8 min   Charges:   PT Evaluation $PT Eval Moderate Complexity: 1 Mod             Doreatha Massed, PT Acute Rehabilitation  Office: [phone] Pager: [phone]

## 2021-01-18 NOTE — TOC Initial Note (Signed)
Transition of Care [facility]) - Initial/Assessment Note    Patient Details  Name: Luis Porter MRN: [id number] Date of Birth: Sep 01, 1960  Transition of Care [facility]) CM/SW Contact:    Dessa Phi, RN Phone Number: 01/18/2021, 1:05 PM  Clinical Narrative: IVC process initiated-await completion-forms in shadow chart.               Expected Discharge Plan: IP Rehab Facility Barriers to Discharge: Active Substance Use - Placement   Patient Goals and CMS Choice Patient states their goals for this hospitalization and ongoing recovery are:: for IVC-Substance abuse CMS Medicare.gov Compare Post Acute Care list provided to:: Patient    Expected Discharge Plan and Services Expected Discharge Plan: [place]   Discharge Planning Services: CM Consult   Living arrangements for the past 2 months: Apartment                                      Prior Living Arrangements/Services Living arrangements for the past 2 months: Apartment Lives with:: Self Patient language and need for interpreter reviewed:: Yes Do you feel safe going back to the place where you live?: Yes      Need for Family Participation in Patient Care: No (Comment) Care giver support system in place?: Yes (comment)   Criminal Activity/Legal Involvement Pertinent to Current Situation/Hospitalization: No - Comment as needed  Activities of Daily Living Home Assistive Devices/Equipment: None ADL Screening (condition at time of admission) Patient's cognitive ability adequate to safely complete daily activities?: Yes Is the patient deaf or have difficulty hearing?: No Does the patient have difficulty seeing, even when wearing glasses/contacts?: No Does the patient have difficulty concentrating, remembering, or making decisions?: No Patient able to express need for assistance with ADLs?: Yes Does the patient have difficulty dressing or bathing?: No Independently performs ADLs?: Yes (appropriate for  developmental age) Does the patient have difficulty walking or climbing stairs?: No Weakness of Legs: None Weakness of Arms/Hands: None  Permission Sought/Granted Permission sought to share information with : Case Manager Permission granted to share information with : Yes, Verbal Permission Granted  Share Information with NAME: Case manager           Emotional Assessment Appearance:: Appears stated age Attitude/Demeanor/Rapport: Apprehensive Affect (typically observed): Agitated Orientation: : Oriented to Self Alcohol / Substance Use: Alcohol Use    Admission diagnosis:  Elevated troponin [R77.8] Syncope, unspecified syncope type [R55] Patient Active Problem List   Diagnosis Date Noted   Elevated troponin 01/16/2021   Alcohol induced fatty liver 08/04/2020   Chronic alcoholic gastritis without hemorrhage 08/04/2020   Alcohol dependence in early full remission ([place]) 08/04/2020   Angioedema 07/28/2020   Alcohol withdrawal syndrome without complication ([place])    [id number] 02/19/2020   Cough 02/19/2020   Abdominal pain, epigastric    GERD (gastroesophageal reflux disease) 09/19/2019   Acute diverticulitis 09/19/2019   Alcohol abuse 09/19/2019   Essential tremor 07/04/2019   Hx of adenomatous colonic polyps 01/06/2017   Herniated lumbar intervertebral disc 05/18/2016   Mixed hyperlipidemia 05/06/2014   BPH with obstruction/lower urinary tract symptoms 03/31/2014   Insomnia 03/03/2014   Diverticulosis of large intestine without hemorrhage 03/03/2014   Alcohol abuse, daily use 03/03/2014   Essential hypertension 07/08/2011   Hiatal hernia with gastroesophageal reflux 07/07/2011   Lumbago with sciatica 07/07/2011   PCP:  Olin Hauser, DO Pharmacy:   [id number] [place],  [place] [address] [place] [address] Phone: [phone] Fax: [phone]     Social Determinants of Health (SDOH) Interventions Food Insecurity  Interventions: Intervention Not Indicated Financial Strain Interventions: Intervention Not Indicated Housing Interventions: Intervention Not Indicated Intimate Partner Violence Interventions: Intervention Not Indicated Stress Interventions: Intervention Not Indicated Social Connections Interventions: Intervention Not Indicated Transportation Interventions: Intervention Not Indicated  Readmission Risk Interventions No flowsheet data found.

## 2021-01-18 NOTE — Plan of Care (Signed)
  Problem: Education: Goal: Knowledge of disease or condition will improve Outcome: Progressing Goal: Understanding of discharge needs will improve Outcome: Progressing   Problem: Health Behavior/Discharge Planning: Goal: Ability to identify changes in lifestyle to reduce recurrence of condition will improve Outcome: Progressing Goal: Identification of resources available to assist in meeting health care needs will improve Outcome: Progressing   Problem: Physical Regulation: Goal: Complications related to the disease process, condition or treatment will be avoided or minimized Outcome: Progressing   Problem: Safety: Goal: Ability to remain free from injury will improve Outcome: Progressing   Problem: Education: Goal: Knowledge of General Education information will improve Description: Including pain rating scale, medication(s)/side effects and non-pharmacologic comfort measures Outcome: Progressing   Problem: Health Behavior/Discharge Planning: Goal: Ability to manage health-related needs will improve Outcome: Progressing   Problem: Clinical Measurements: Goal: Ability to maintain clinical measurements within normal limits will improve Outcome: Progressing Goal: Diagnostic test results will improve Outcome: Progressing Goal: Respiratory complications will improve Outcome: Progressing   Problem: Activity: Goal: Risk for activity intolerance will decrease Outcome: Progressing

## 2021-01-18 NOTE — Plan of Care (Signed)
  Problem: Education: Goal: Knowledge of disease or condition will improve Outcome: Progressing   Problem: Safety: Goal: Ability to remain free from injury will improve Outcome: Progressing   Problem: Clinical Measurements: Goal: Respiratory complications will improve Outcome: Progressing   Problem: Health Behavior/Discharge Planning: Goal: Ability to identify changes in lifestyle to reduce recurrence of condition will improve Outcome: Not Progressing   Problem: Health Behavior/Discharge Planning: Goal: Identification of resources available to assist in meeting health care needs will improve Outcome: Not Met (add Reason)

## 2021-01-18 NOTE — Progress Notes (Signed)
   01/18/21 0314  CIWA-Ar  Pulse Rate (!) 102  Nausea and Vomiting 0  Tactile Disturbances 0  Tremor 7  Auditory Disturbances 0  Paroxysmal Sweats 2  Visual Disturbances 3  Anxiety 1  Headache, Fullness in Head 0  Agitation 2  Orientation and Clouding of Sensorium 0  CIWA-Ar Total 15  Pt in bed and is restless.  Attempting to pull at IV.  Pt not cooperative with taking po ativan so IV given.  Pt still remains confused. Reoriented but this has not reoriented pt.  Pt thought the door was a physician and is insistent on going home.  Reorientation was provided again with no changes.

## 2021-01-18 NOTE — Progress Notes (Signed)
Pt experience alcohol withdrawal this morning. Agitated, restlessness, tele-monitor, and IV removal, attempted to leave the unit X 2. Charge nurse Chancy Hurter notified, Eleonore Chiquito MD paged. IM Diazepam x 1 ordered. Hallucination, confusion, and agitation continued. See MAR and MD notes for additional orders. CIWA score, frequent contact and assessment throughout my shift to assume pt's safety and well-being.

## 2021-01-18 NOTE — Progress Notes (Signed)
[facility]  PROGRESS NOTE  Luis Porter [id number] DOB: 05/02/1961 DOA: 01/16/2021 PCP: Olin Hauser, DO   Brief HPI:   60 year old male with history of alcohol use disorder, complicated by fatty liver, chronic gastritis, BPH, hypertension presented with 2 episodes of syncope.  In the ED lab work showed mild elevation of AST and ALT, alcohol level 358.  EKG showed sinus rhythm with borderline inferior lead T wave abnormalities but no significant changes.    Subjective   Went into full blown alcohol withdrawal this morning. He was agitated, and hallucinating, lost IV access so was given IM diazepam 5 mg x 1. Still confused with tremors in upper extremities.    Assessment/Plan:    Syncope/new onset systolic CHF -Resolved -Likely vasovagal; telemetry showed normal sinus rhythm -EKG showed borderline T wave abnormalities in leads II, III and aVF -Troponin was elevated at 37, 30, 28 -Echocardiogram obtained shows EF 25% with global hypokinesis -We will consult cardiology  Alcohol withdrawal syndrome -Patient went into alcohol withdrawal delirium this morning -He has been tremulous -Confused, agitated, having loose [place] -Started on Ativan per [employer] protocol -Last CIWA score was 26 -Continue thiamine, folic acid -We will start Librium detox protocol -Patient trying to leave hospital, he is risk to self as he has no medical decision-making capacity due to delirium due to alcohol withdrawal.  Will initiate IVC paperwork for involuntary commitment in the hospital.  Hypokalemia -Potassium is still 3.1 -will give K. Dur 40 mg p.o. 3 times daily for 3 doses -Follow BMP in am  Hypertension -Continue amlodipine  Chronic gastritis -Continue pantoprazole   Scheduled medications:    amLODipine  10 mg Oral Daily   aspirin  324 mg Oral Once   enoxaparin (LOVENOX) injection  40 mg Subcutaneous [id number]   folic acid  1 mg Oral Daily   LORazepam  0-4 mg  Intravenous Q6H   Or   LORazepam  0-4 mg Oral Q6H   multivitamin with minerals  1 tablet Oral Daily   pantoprazole  80 mg Oral Daily   thiamine  100 mg Oral Daily   Or   thiamine  100 mg Intravenous Daily         Data Reviewed:   CBG:  Recent Labs  Lab 01/16/21 1202  GLUCAP 100*    SpO2: 94 %    Vitals:   01/18/21 0523 01/18/21 0528 01/18/21 0536 01/18/21 0926  BP:  (!) 148/102  139/90  Pulse: (!) 122 (!) 122  (!) 118  Resp:  20  16  Temp:  98.2 F (36.8 C)  98.3 F (36.8 C)  TempSrc:  Oral  Oral  SpO2:  95%  94%  Weight:   66.6 kg   Height:         Intake/Output Summary (Last 24 hours) at 01/18/2021 0958 Last data filed at 01/18/2021 0020 Gross per 24 hour  Intake 480 ml  Output 501 ml  Net -21 ml    08/20 1901 - 08/22 0700 In: 600 [P.O.:600] Out: 501 [Urine:501]  Filed Weights   01/16/21 2300 01/18/21 0536  Weight: 68.9 kg 66.6 kg    CBC:  Recent Labs  Lab 01/12/21 1034 01/16/21 1202 01/17/21 0248  WBC 5.4 4.8 4.4  HGB 16.1 15.6 15.9  HCT 47.1 43.8 45.2  PLT 165 179 180  MCV 94 91.8 93.8  MCH 32.0 32.7 33.0  MCHC 34.2 35.6 35.2  RDW 15.4 15.2 15.7*  LYMPHSABS  --  1.7  --  MONOABS  --  0.7  --   EOSABS  --  0.1  --   BASOSABS  --  0.2*  --     Complete metabolic panel:  Recent Labs  Lab 01/12/21 1034 01/16/21 1202 01/16/21 1235 01/17/21 0248 01/18/21 0423  NA  --   --  141 138 135  K  --   --  3.4* 3.1* 3.1*  CL  --   --  106 100 102  CO2  --   --  '22 27 23  '$ GLUCOSE  --   --  85 108* 102*  BUN  --   --  '6 8 8  '$ CREATININE  --   --  0.59* 0.54* 0.53*  CALCIUM  --   --  7.6* 9.3 9.3  AST 125*  --  123* 130* 83*  ALT 68*  --  71* 79* 63*  ALKPHOS 96  --  56 68 65  BILITOT 0.5  --  0.8 1.3* 1.1  ALBUMIN 4.6  --  3.5 4.2 3.9  MG  --  2.0  --  1.8  --     No results for input(s): LIPASE, AMYLASE in the last 168 hours.  Recent Labs  Lab 01/16/21 1741  SARSCOV2NAA NEGATIVE     ------------------------------------------------------------------------------------------------------------------ No results for input(s): CHOL, HDL, LDLCALC, TRIG, CHOLHDL, LDLDIRECT in the last 72 hours.  Lab Results  Component Value Date   HGBA1C 5.2 11/14/2016   ------------------------------------------------------------------------------------------------------------------ No results for input(s): TSH, T4TOTAL, T3FREE, THYROIDAB in the last 72 hours.  Invalid input(s): FREET3 ------------------------------------------------------------------------------------------------------------------ No results for input(s): VITAMINB12, FOLATE, FERRITIN, TIBC, IRON, RETICCTPCT in the last 72 hours.  Coagulation profile No results for input(s): INR, PROTIME in the last 168 hours. No results for input(s): DDIMER in the last 72 hours.  Cardiac Enzymes No results for input(s): CKTOTAL, CKMB, CKMBINDEX, TROPONINI in the last 168 hours.  ------------------------------------------------------------------------------------------------------------------ No results found for: BNP   Antibiotics: Anti-infectives (From admission, onward)    None        Radiology Reports  DG Chest Portable 1 View  Result Date: 01/16/2021 CLINICAL DATA:  Per EMS, patient from home, c/o 25 pound weight loss in 1 month with decreased PO intake. Fatigue x 3 days. Reports he consumes 5 drinks daily. One shot and one beer today. Hx of liver disease and anemia. EXAM: PORTABLE CHEST 1 VIEW COMPARISON:  02/17/2020 FINDINGS: Normal cardiac silhouette.  No mediastinal or hilar masses. Clear lungs.  No convincing pleural effusion or pneumothorax. Skeletal structures are grossly intact. IMPRESSION: No active disease. Electronically Signed   By: Lajean Manes M.D.   On: 01/16/2021 15:30      DVT prophylaxis: Lovenox  Code Status: Full code  Family Communication: No family at  bedside   Consultants:   Procedures:     Objective    Physical Examination:  General-appears in no acute distress Heart-S1-S2, regular, no murmur auscultated Lungs-clear to auscultation bilaterally, no wheezing or crackles auscultated Abdomen-soft, nontender, no organomegaly Extremities-no edema in the lower extremities Neuro-alert, oriented to self only, having hallucinations, confused, moving all extremities   Status is: Inpatient  Dispo: The patient is from: Home              Anticipated d/c is to: Home versus skilled nursing facility              Anticipated d/c date is: 01/21/2021              Patient currently not  stable for discharge  Barrier to discharge-alcohol withdrawal  COVID-19 Labs  No results for input(s): DDIMER, FERRITIN, LDH, CRP in the last 72 hours.  Lab Results  Component Value Date   SARSCOV2NAA NEGATIVE 01/16/2021   SARSCOV2NAA NEGATIVE 07/28/2020   [place] Not Detected 06/18/2020   SARSCOV2NAA POSITIVE (A) 02/17/2020    Microbiology  Recent Results (from the past 240 hour(s))  Resp Panel by RT-PCR (Flu A&B, Covid) Nasopharyngeal Swab     Status: None   Collection Time: 01/16/21  5:41 PM   Specimen: Nasopharyngeal Swab; Nasopharyngeal(NP) swabs in vial transport medium  Result Value Ref Range Status   SARS Coronavirus 2 by RT PCR NEGATIVE NEGATIVE Final    Comment: (NOTE) SARS-CoV-2 target nucleic acids are NOT DETECTED.  The SARS-CoV-2 RNA is generally detectable in upper respiratory specimens during the acute phase of infection. The lowest concentration of SARS-CoV-2 viral copies this assay can detect is 138 copies/mL. A negative result does not preclude SARS-Cov-2 infection and should not be used as the sole basis for treatment or other patient management decisions. A negative result may occur with  improper specimen collection/handling, submission of specimen other than nasopharyngeal swab, presence of viral mutation(s)  within the areas targeted by this assay, and inadequate number of viral copies(<138 copies/mL). A negative result must be combined with clinical observations, patient history, and epidemiological information. The expected result is Negative.  Fact Sheet for Patients:  EntrepreneurPulse.com.au  Fact Sheet for Healthcare Providers:  IncredibleEmployment.be  This test is no t yet approved or cleared by the [place] FDA and  has been authorized for detection and/or diagnosis of SARS-CoV-2 by FDA under an Emergency Use Authorization (EUA). This EUA will remain  in effect (meaning this test can be used) for the duration of the COVID-19 declaration under Section 564(b)(1) of the Act, 21 U.S.C.section 360bbb-3(b)(1), unless the authorization is terminated  or revoked sooner.       Influenza A by PCR NEGATIVE NEGATIVE Final   Influenza B by PCR NEGATIVE NEGATIVE Final    Comment: (NOTE) The Xpert Xpress SARS-CoV-2/FLU/RSV plus assay is intended as an aid in the diagnosis of influenza from Nasopharyngeal swab specimens and should not be used as a sole basis for treatment. Nasal washings and aspirates are unacceptable for Xpert Xpress SARS-CoV-2/FLU/RSV testing.  Fact Sheet for Patients: EntrepreneurPulse.com.au  Fact Sheet for Healthcare Providers: IncredibleEmployment.be  This test is not yet approved or cleared by the [place] FDA and has been authorized for detection and/or diagnosis of SARS-CoV-2 by FDA under an Emergency Use Authorization (EUA). This EUA will remain in effect (meaning this test can be used) for the duration of the COVID-19 declaration under Section 564(b)(1) of the Act, 21 U.S.C. section 360bbb-3(b)(1), unless the authorization is terminated or revoked.  Performed at [facility], [place] [address], [place], [place] [address]              Oswald Hillock   [facility] If 7PM-7AM, please contact night-coverage at [email or web address], Office  [phone]   01/18/2021, 9:58 AM  LOS: 0 days

## 2021-01-18 NOTE — Progress Notes (Signed)
   01/18/21 0354  CIWA-Ar  Pulse Rate (!) 110  Nausea and Vomiting 0  Tactile Disturbances 4  Tremor 5  Auditory Disturbances 2  Paroxysmal Sweats 1  Visual Disturbances 2  Anxiety 1  Headache, Fullness in Head 0  Agitation 0  Orientation and Clouding of Sensorium 0  CIWA-Ar Total 15  Pt is confused and is trying to get out of the bed.  When asked where the pt was going pt states he did not know that church was in service.  Pt reoriented. Discussed with pt to not touch IV.  Medicated per CIWA protocol.  Pt situated in bed and instructed to not get out of bed without help.

## 2021-01-18 NOTE — Progress Notes (Signed)
   01/18/21 0210  CIWA-Ar  Nausea and Vomiting 0  Tactile Disturbances 0  Tremor 7  Auditory Disturbances 0  Paroxysmal Sweats 3  Visual Disturbances 2  Anxiety 2  Headache, Fullness in Head 0  Agitation 1  Orientation and Clouding of Sensorium 0  CIWA-Ar Total 15  Pt was noted out of bed. Pt is confused.  Pt states that he wants to go home. Pt is confused as to where he is at currently.  Refer to [place].  Not easily redirected.  Takes much coaxing in order to get pt back to bed and settled. No pt needs to the bathroom at this time.

## 2021-01-18 NOTE — Progress Notes (Signed)
   01/18/21 0523  CIWA-Ar  Pulse Rate (!) 122  Nausea and Vomiting 0  Tactile Disturbances 4  Tremor 5  Auditory Disturbances 2  Paroxysmal Sweats 1  Visual Disturbances 2  Anxiety 1  Headache, Fullness in Head 0  Agitation 0  Orientation and Clouding of Sensorium 0  CIWA-Ar Total 15  Pt was out of bed.  Pt is confused. Pt attempts to pull out IV was unsuccessful.  Pt returned to bed.Pt easily redirected.

## 2021-01-18 NOTE — Progress Notes (Signed)
Pt out of bed.  Pt assisted back to bed x3 staff members.  Pt is confused and thinks he is waiting for the ambulance.  Belt Bed Alarm was applied to pt.  IV still remains intact at this time.  Pt has to be redirected multiple times to stay on task.

## 2021-01-19 DIAGNOSIS — F1010 Alcohol abuse, uncomplicated: Secondary | ICD-10-CM | POA: Diagnosis not present

## 2021-01-19 DIAGNOSIS — K700 Alcoholic fatty liver: Secondary | ICD-10-CM

## 2021-01-19 DIAGNOSIS — I10 Essential (primary) hypertension: Secondary | ICD-10-CM

## 2021-01-19 DIAGNOSIS — I420 Dilated cardiomyopathy: Secondary | ICD-10-CM

## 2021-01-19 DIAGNOSIS — R778 Other specified abnormalities of plasma proteins: Secondary | ICD-10-CM

## 2021-01-19 LAB — COMPREHENSIVE METABOLIC PANEL
ALT: 69 U/L — ABNORMAL HIGH (ref 0–44)
AST: 93 U/L — ABNORMAL HIGH (ref 15–41)
Albumin: 4.3 g/dL (ref 3.5–5.0)
Alkaline Phosphatase: 71 U/L (ref 38–126)
Anion gap: 13 (ref 5–15)
BUN: 12 mg/dL (ref 6–20)
CO2: 22 mmol/L (ref 22–32)
Calcium: 10 mg/dL (ref 8.9–10.3)
Chloride: 103 mmol/L (ref 98–111)
Creatinine, Ser: 0.65 mg/dL (ref 0.61–1.24)
GFR, Estimated: >60 mL/min (ref >60)
Glucose, Bld: 89 mg/dL (ref 70–99)
Potassium: 3.7 mmol/L (ref 3.5–5.1)
Sodium: 138 mmol/L (ref 135–145)
Total Bilirubin: 1.6 mg/dL — ABNORMAL HIGH (ref 0.3–1.2)
Total Protein: 8.3 g/dL — ABNORMAL HIGH (ref 6.5–8.1)

## 2021-01-19 MED ORDER — SODIUM CHLORIDE 0.9% FLUSH
3.0000 mL | Freq: Two times a day (BID) | INTRAVENOUS | Status: DC
  Administered 2021-01-19 – 2021-01-21 (×5): 3 mL via INTRAVENOUS

## 2021-01-19 MED ORDER — HYDROCORTISONE 1 % EX CREA
TOPICAL_CREAM | Freq: Three times a day (TID) | CUTANEOUS | Status: DC
  Administered 2021-01-19 – 2021-01-20 (×2): 1 via TOPICAL
  Filled 2021-01-19: qty 28

## 2021-01-19 MED ORDER — POLYVINYL ALCOHOL 1.4 % OP SOLN
1.0000 [drp] | OPHTHALMIC | Status: DC | PRN
  Administered 2021-01-20: 1 [drp] via OPHTHALMIC
  Filled 2021-01-19: qty 15

## 2021-01-19 MED ORDER — ASPIRIN EC 81 MG PO TBEC
81.0000 mg | DELAYED_RELEASE_TABLET | Freq: Every day | ORAL | Status: DC
  Administered 2021-01-20 – 2021-01-21 (×2): 81 mg via ORAL
  Filled 2021-01-19 (×2): qty 1

## 2021-01-19 MED ORDER — METOPROLOL SUCCINATE ER 50 MG PO TB24
50.0000 mg | ORAL_TABLET | Freq: Every day | ORAL | Status: DC
  Administered 2021-01-19 – 2021-01-21 (×3): 50 mg via ORAL
  Filled 2021-01-19 (×3): qty 1

## 2021-01-19 NOTE — Progress Notes (Addendum)
[facility]  PROGRESS NOTE  Tziah Bisaillon Pimenta [id number] DOB: 1961-01-21 DOA: 01/16/2021 PCP: Olin Hauser, DO   Brief HPI:   60 year old male with history of alcohol use disorder, complicated by fatty liver, chronic gastritis, BPH, hypertension presented with 2 episodes of syncope.  In the ED lab work showed mild elevation of AST and ALT, alcohol level 358.  EKG showed sinus rhythm with borderline inferior lead T wave abnormalities but no significant changes.    Subjective   Echo showed EF 25%, cardiology consulted. Today he is pleasant, oriented x 3. Still has tremors in upper extremities.  He was IVC'd yesterday due to worsening hallucinations and trying to leave the hospital.   Assessment/Plan:    Syncope/dilated cardiomyopathy -Resolved -Likely vasovagal; telemetry showed normal sinus rhythm -EKG showed borderline T wave abnormalities in leads II, III and aVF -Troponin was elevated at 37, 30, 28 -Echocardiogram obtained shows EF 25% with global hypokinesis -Cardiology has been consulted, plan for left and right heart cath tomorrow  Alcohol withdrawal syndrome -Patient went into alcohol withdrawal delirium on 01/18/2021 -He had been tremulous -Confused, agitated, having hallucinations -He was initially started on started on Ativan per CIWA protocol -Last CIWA score was 26 -Continue thiamine, folic acid -Ativan was not effective so it was discontinued and patient started on Librium detox protocol -Patient was trying to leave hospital, he is risk to self as he has no medical decision-making capacity due to delirium due to alcohol withdrawal.  So IVC paperwork was initiated and patient was involuntarily committed in the hospital.    Hypokalemia -Replete millimeters  Hypertension -Continue amlodipine  Chronic gastritis -Continue pantoprazole   Scheduled medications:    amLODipine  10 mg Oral Daily   aspirin  324 mg Oral Once   chlordiazePOXIDE   25 mg Oral QID   Followed by   chlordiazePOXIDE  25 mg Oral TID   Followed by   Derrill Memo ON 01/20/2021] chlordiazePOXIDE  25 mg Oral BH-qamhs   Followed by   Derrill Memo ON 01/21/2021] chlordiazePOXIDE  25 mg Oral Daily   enoxaparin (LOVENOX) injection  40 mg Subcutaneous [id number]   folic acid  1 mg Oral Daily   magnesium oxide  400 mg Oral BID   multivitamin with minerals  1 tablet Oral Daily   pantoprazole  80 mg Oral Daily   thiamine  100 mg Oral Daily   Or   thiamine  100 mg Intravenous Daily         Data Reviewed:   CBG:  Recent Labs  Lab 01/16/21 1202  GLUCAP 100*    SpO2: 97 %    Vitals:   01/18/21 0926 01/18/21 1234 01/18/21 2031 01/19/21 0412  BP: 139/90 135/90 (!) 149/84 (!) 140/98  Pulse: (!) 118 74 99 (!) 101  Resp: '16  16 17  '$ Temp: 98.3 F (36.8 C)  97.9 F (36.6 C) 98.1 F (36.7 C)  TempSrc: Oral  Oral Oral  SpO2: 94% 97% 98% 97%  Weight:      Height:         Intake/Output Summary (Last 24 hours) at 01/19/2021 1038 Last data filed at 01/19/2021 0812 Gross per 24 hour  Intake 480 ml  Output --  Net 480 ml    08/21 1901 - 08/23 0700 In: 600 [P.O.:600] Out: 501 [Urine:501]  Filed Weights   01/16/21 2300 01/18/21 0536  Weight: 68.9 kg 66.6 kg    CBC:  Recent Labs  Lab 01/16/21 1202 01/17/21 0248  WBC 4.8 4.4  HGB 15.6 15.9  HCT 43.8 45.2  PLT 179 180  MCV 91.8 93.8  MCH 32.7 33.0  MCHC 35.6 35.2  RDW 15.2 15.7*  LYMPHSABS 1.7  --   MONOABS 0.7  --   EOSABS 0.1  --   BASOSABS 0.2*  --     Complete metabolic panel:  Recent Labs  Lab 01/16/21 1202 01/16/21 1235 01/17/21 0248 01/18/21 0423 01/19/21 0447  NA  --  141 138 135 138  K  --  3.4* 3.1* 3.1* 3.7  CL  --  106 100 102 103  CO2  --  '22 27 23 22  '$ GLUCOSE  --  85 108* 102* 89  BUN  --  '6 8 8 12  '$ CREATININE  --  0.59* 0.54* 0.53* 0.65  CALCIUM  --  7.6* 9.3 9.3 10.0  AST  --  123* 130* 83* 93*  ALT  --  71* 79* 63* 69*  ALKPHOS  --  56 68 65 71  BILITOT  --  0.8  1.3* 1.1 1.6*  ALBUMIN  --  3.5 4.2 3.9 4.3  MG 2.0  --  1.8  --   --     No results for input(s): LIPASE, AMYLASE in the last 168 hours.  Recent Labs  Lab 01/16/21 1741  SARSCOV2NAA NEGATIVE    ------------------------------------------------------------------------------------------------------------------ No results for input(s): CHOL, HDL, LDLCALC, TRIG, CHOLHDL, LDLDIRECT in the last 72 hours.  Lab Results  Component Value Date   HGBA1C 5.2 11/14/2016   ------------------------------------------------------------------------------------------------------------------ No results for input(s): TSH, T4TOTAL, T3FREE, THYROIDAB in the last 72 hours.  Invalid input(s): FREET3 ------------------------------------------------------------------------------------------------------------------ No results for input(s): VITAMINB12, FOLATE, FERRITIN, TIBC, IRON, RETICCTPCT in the last 72 hours.  Coagulation profile No results for input(s): INR, PROTIME in the last 168 hours. No results for input(s): DDIMER in the last 72 hours.  Cardiac Enzymes No results for input(s): CKTOTAL, CKMB, CKMBINDEX, TROPONINI in the last 168 hours.  ------------------------------------------------------------------------------------------------------------------ No results found for: BNP   Antibiotics: Anti-infectives (From admission, onward)    None        Radiology Reports  ECHOCARDIOGRAM COMPLETE  Result Date: 01/18/2021    ECHOCARDIOGRAM REPORT   Patient Name:   RIHAN THOMSEN Wilmer Date of Exam: 01/18/2021 Medical Rec #:  [id number]             Height:       66.0 in Accession #:    [id number]            Weight:       146.8 lb Date of Birth:  30-Jul-1960             BSA:          1.754 m Patient Age:    60 years              BP:           148/102 mmHg Patient Gender: M                     HR:           127 bpm. Exam Location:  Inpatient Procedure: 2D Echo, Cardiac Doppler and Color Doppler   Results communicated to Dr Darrick Meigs at 16:19 on 01/18/21. Indications:     Syncope  History:         Patient has no prior history of Echocardiogram examinations.  Risk Factors:Hypertension.  Sonographer:     Luisa Hart RDCS Referring Phys:  [place] Diagnosing Phys: Oswaldo Milian MD IMPRESSIONS  1. Left ventricular ejection fraction, by estimation, is 20 to 25%. The left ventricle has severely decreased function. The left ventricle demonstrates global hypokinesis. There is mild left ventricular hypertrophy. Left ventricular diastolic parameters  are consistent with Grade I diastolic dysfunction (impaired relaxation).  2. Right ventricular systolic function is normal. The right ventricular size is normal.  3. The mitral valve is normal in structure. No evidence of mitral valve regurgitation. No evidence of mitral stenosis.  4. The aortic valve was not well visualized. Aortic valve regurgitation is not visualized. No aortic stenosis is present. FINDINGS  Left Ventricle: Left ventricular ejection fraction, by estimation, is 20 to 25%. The left ventricle has severely decreased function. The left ventricle demonstrates global hypokinesis. The left ventricular internal cavity size was normal in size. There is mild left ventricular hypertrophy. Left ventricular diastolic parameters are consistent with Grade I diastolic dysfunction (impaired relaxation). Right Ventricle: The right ventricular size is normal. No increase in right ventricular wall thickness. Right ventricular systolic function is normal. Left Atrium: Left atrial size was normal in size. Right Atrium: Right atrial size was normal in size. Pericardium: Trivial pericardial effusion is present. Mitral Valve: The mitral valve is normal in structure. No evidence of mitral valve regurgitation. No evidence of mitral valve stenosis. Tricuspid Valve: The tricuspid valve is normal in structure. Tricuspid valve regurgitation is trivial.  Aortic Valve: The aortic valve was not well visualized. Aortic valve regurgitation is not visualized. No aortic stenosis is present. Aortic valve mean gradient measures 2.0 mmHg. Aortic valve peak gradient measures 2.8 mmHg. Aortic valve area, by VTI measures 4.22 cm. Pulmonic Valve: The pulmonic valve was grossly normal. Pulmonic valve regurgitation is not visualized. Aorta: The aortic root and ascending aorta are structurally normal, with no evidence of dilitation. IAS/Shunts: The interatrial septum was not well visualized.  LEFT VENTRICLE PLAX 2D LVIDd:         5.20 cm     Diastology LVIDs:         4.40 cm     LV e' medial:    5.55 cm/s LV PW:         1.00 cm     LV E/e' medial:  4.8 LV IVS:        1.00 cm     LV e' lateral:   8.58 cm/s LVOT diam:     2.60 cm     LV E/e' lateral: 3.1 LV SV:         45 LV SV Index:   25 LVOT Area:     5.31 cm  LV Volumes (MOD) LV vol d, MOD A2C: 76.7 ml LV vol d, MOD A4C: 59.7 ml LV vol s, MOD A2C: 59.0 ml LV vol s, MOD A4C: 56.5 ml LV SV MOD A2C:     17.7 ml LV SV MOD A4C:     59.7 ml LV SV MOD BP:      11.8 ml RIGHT VENTRICLE RV S prime:     11.70 cm/s TAPSE (M-mode): 1.7 cm LEFT ATRIUM             Index LA diam:        2.00 cm 1.14 cm/m LA Vol (A2C):   19.0 ml 10.83 ml/m LA Vol (A4C):   11.6 ml 6.61 ml/m LA Biplane Vol: 15.3 ml 8.72 ml/m  AORTIC  VALVE                   PULMONIC VALVE AV Area (Vmax):    3.99 cm    PV Vmax:       0.73 m/s AV Area (Vmean):   3.96 cm    PV Vmean:      45.800 cm/s AV Area (VTI):     4.22 cm    PV VTI:        0.080 m AV Vmax:           83.60 cm/s  PV Peak grad:  2.1 mmHg AV Vmean:          56.500 cm/s PV Mean grad:  1.0 mmHg AV VTI:            0.106 m AV Peak Grad:      2.8 mmHg AV Mean Grad:      2.0 mmHg LVOT Vmax:         62.90 cm/s LVOT Vmean:        42.100 cm/s LVOT VTI:          0.084 m LVOT/AV VTI ratio: 0.79  AORTA Ao Root diam: 3.20 cm Ao Asc diam:  3.30 cm MITRAL VALVE               TRICUSPID VALVE MV Area (PHT): 8.25 cm    TR  Peak grad:   16.5 mmHg MV Decel Time: 92 msec     TR Vmax:        203.00 cm/s MV E velocity: 26.80 cm/s MV A velocity: 64.50 cm/s  SHUNTS MV E/A ratio:  0.42        Systemic VTI:  0.08 m                            Systemic Diam: 2.60 cm Oswaldo Milian MD Electronically signed by Oswaldo Milian MD Signature Date/Time: 01/18/2021/4:20:18 PM    Final (Updated)       DVT prophylaxis: Lovenox  Code Status: Full code  Family Communication: No family at bedside   Consultants:   Procedures:     Objective    Physical Examination:  General-appears in no acute distress Heart-S1-S2, regular, no murmur auscultated Lungs-clear to auscultation bilaterally, no wheezing or crackles auscultated Abdomen-soft, nontender, no organomegaly Extremities-no edema in the lower extremities Neuro-alert, oriented x3, no focal deficit noted   Status is: Inpatient  Dispo: The patient is from: Home              Anticipated d/c is to: Home versus skilled nursing facility              Anticipated d/c date is: 01/20/2021              Patient currently not stable for discharge  Barrier to discharge-alcohol withdrawal  COVID-19 Labs  No results for input(s): DDIMER, FERRITIN, LDH, CRP in the last 72 hours.  Lab Results  Component Value Date   SARSCOV2NAA NEGATIVE 01/16/2021   SARSCOV2NAA NEGATIVE 07/28/2020   SARSCOV2NAA Not Detected 06/18/2020   SARSCOV2NAA POSITIVE (A) 02/17/2020    Microbiology  Recent Results (from the past 240 hour(s))  Resp Panel by RT-PCR (Flu A&B, Covid) Nasopharyngeal Swab     Status: None   Collection Time: 01/16/21  5:41 PM   Specimen: Nasopharyngeal Swab; Nasopharyngeal(NP) swabs in vial transport medium  Result Value Ref Range Status   SARS Coronavirus 2 by RT  PCR NEGATIVE NEGATIVE Final    Comment: (NOTE) SARS-CoV-2 target nucleic acids are NOT DETECTED.  The SARS-CoV-2 RNA is generally detectable in upper respiratory specimens during the acute  phase of infection. The lowest concentration of SARS-CoV-2 viral copies this assay can detect is 138 copies/mL. A negative result does not preclude SARS-Cov-2 infection and should not be used as the sole basis for treatment or other patient management decisions. A negative result may occur with  improper specimen collection/handling, submission of specimen other than nasopharyngeal swab, presence of viral mutation(s) within the areas targeted by this assay, and inadequate number of viral copies(<138 copies/mL). A negative result must be combined with clinical observations, patient history, and epidemiological information. The expected result is Negative.  Fact Sheet for Patients:  EntrepreneurPulse.com.au  Fact Sheet for Healthcare Providers:  IncredibleEmployment.be  This test is no t yet approved or cleared by the [place] FDA and  has been authorized for detection and/or diagnosis of SARS-CoV-2 by FDA under an Emergency Use Authorization (EUA). This EUA will remain  in effect (meaning this test can be used) for the duration of the COVID-19 declaration under Section 564(b)(1) of the Act, 21 U.S.C.section 360bbb-3(b)(1), unless the authorization is terminated  or revoked sooner.       Influenza A by PCR NEGATIVE NEGATIVE Final   Influenza B by PCR NEGATIVE NEGATIVE Final    Comment: (NOTE) The Xpert Xpress SARS-CoV-2/FLU/RSV plus assay is intended as an aid in the diagnosis of influenza from Nasopharyngeal swab specimens and should not be used as a sole basis for treatment. Nasal washings and aspirates are unacceptable for Xpert Xpress SARS-CoV-2/FLU/RSV testing.  Fact Sheet for Patients: EntrepreneurPulse.com.au  Fact Sheet for Healthcare Providers: IncredibleEmployment.be  This test is not yet approved or cleared by the [place] FDA and has been authorized for detection and/or diagnosis of  SARS-CoV-2 by FDA under an Emergency Use Authorization (EUA). This EUA will remain in effect (meaning this test can be used) for the duration of the COVID-19 declaration under Section 564(b)(1) of the Act, 21 U.S.C. section 360bbb-3(b)(1), unless the authorization is terminated or revoked.  Performed at [facility], [place] [address], [place], [place] [address]         Oswald Hillock   [facility] If 7PM-7AM, please contact night-coverage at [email or web address], Office  [phone]   01/19/2021, 10:38 AM  LOS: 1 day

## 2021-01-19 NOTE — Consult Note (Addendum)
Cardiology Consultation:   Patient ID: Luis Porter MRN: [id number]; DOB: January 04, 1961  Admit date: 01/16/2021 Date of Consult: 01/19/2021  PCP:  Olin Hauser, DO   [place] [facility] Cardiologist:  New to Dr. Radford Pax   Patient Profile:   Luis Porter is a 60 y.o. male with a hx of alcoholic fatty liver, heavy alcohol abuse, hypertension, gastritis and prior tobacco smoking who is being seen 01/19/2021 for the evaluation of CHF at the request of Dr. Darrick Meigs.  History of Present Illness:   Luis Porter admitted for evaluation of syncope.  He stood up from couch and felt dizzy sweaty and passed out/fall into couch.  Had similar symptoms again.  Patient reports heavy drinking of 12 pack of beer per day with some shots. Has slows down lately. Brought here by EMS. LFTS minimally up. Alcohol level 328.  Troponin flat low 29>>37>>30>>28.  Patient had alcohol withdrawal yesterday.  Echocardiogram showed depressed LV function at 20 to 25%, wall motion abnormality and grade 1 diastolic dysfunction.  Cardiology is asked for further evaluation.  Patient reports few months history of progressive worsening exertional chest tightness and shortness of breath.  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  Reports prior history of tobacco smoking.  He started smoking at age 8.  At one point, he was smoking about 3 packs/day.  Reports quit smoking few years ago.  Denies orthopnea, PND, lower extremity edema, palpitation or chronic dizziness.  Echo 01/18/2021 1. Left ventricular ejection fraction, by estimation, is 20 to 25%. The  left ventricle has severely decreased function. The left ventricle  demonstrates global hypokinesis. There is mild left ventricular  hypertrophy. Left ventricular diastolic parameters   are consistent with Grade I diastolic dysfunction (impaired relaxation).   2. Right ventricular systolic function is  normal. The right ventricular  size is normal.   3. The mitral valve is normal in structure. No evidence of mitral valve  regurgitation. No evidence of mitral stenosis.   4. The aortic valve was not well visualized. Aortic valve regurgitation  is not visualized. No aortic stenosis is present.  Past Medical History:  Diagnosis Date   Diverticulosis    Gastric ulcer    GERD (gastroesophageal reflux disease)    History of hiatal hernia    Hypertension    Iron deficiency anemia 10/18/2019   Recurrent umbilical hernia with incarceration 11/08/2016   Previously repaired x 1    Past Surgical History:  Procedure Laterality Date   BACK SURGERY     COLONOSCOPY WITH PROPOFOL N/A 12/30/2016   Procedure: COLONOSCOPY WITH PROPOFOL;  Surgeon: [name], MD;  Location[facility] ENDOSCOPY;  Service: Endoscopy;  Laterality: N/A;   COLONOSCOPY WITH PROPOFOL N/A 10/31/2019   Procedure: COLONOSCOPY WITH PROPOFOL;  Surgeon: Lin Landsman, MD;  Location: [facility] ENDOSCOPY;  Service: Gastroenterology;  Laterality: N/A;   ESOPHAGOGASTRODUODENOSCOPY (EGD) WITH PROPOFOL N/A 12/30/2016   Procedure: ESOPHAGOGASTRODUODENOSCOPY (EGD) WITH PROPOFOL;  Surgeon: [name], MD;  Location: [facility] ENDOSCOPY;  Service: Endoscopy;  Laterality: N/A;   ESOPHAGOGASTRODUODENOSCOPY (EGD) WITH PROPOFOL N/A 10/31/2019   Procedure: ESOPHAGOGASTRODUODENOSCOPY (EGD) WITH PROPOFOL;  Surgeon: Lin Landsman, MD;  Location: [facility] ENDOSCOPY;  Service: Gastroenterology;  Laterality: N/A;   HERNIA REPAIR  [id number]   Umbilical Hernia Repair   LUMBAR LAMINECTOMY/DECOMPRESSION MICRODISCECTOMY Right 05/18/2016   Procedure: right L4-5 microdiscectomy;  Surgeon: Blanche East, MD;  Location: [facility] ORS;  Service: Neurosurgery;  Laterality: Right;  TONSILLECTOMY     UMBILICAL HERNIA REPAIR N/A 06/29/2018   Procedure: LAPAROSCOPIC REPAIR OF RECURRENT UMBILICAL HERNIA WITH MESH;  Surgeon: Vickie Epley, MD;  Location: [facility] ORS;  Service:  General;  Laterality: N/A;     Inpatient Medications: Scheduled Meds:  amLODipine  10 mg Oral Daily   aspirin  324 mg Oral Once   chlordiazePOXIDE  25 mg Oral QID   Followed by   chlordiazePOXIDE  25 mg Oral TID   Followed by   Derrill Memo ON 01/20/2021] chlordiazePOXIDE  25 mg Oral BH-qamhs   Followed by   Derrill Memo ON 01/21/2021] chlordiazePOXIDE  25 mg Oral Daily   enoxaparin (LOVENOX) injection  40 mg Subcutaneous [id number]   folic acid  1 mg Oral Daily   hydrocortisone cream   Topical TID   magnesium oxide  400 mg Oral BID   multivitamin with minerals  1 tablet Oral Daily   pantoprazole  80 mg Oral Daily   thiamine  100 mg Oral Daily   Or   thiamine  100 mg Intravenous Daily   Continuous Infusions:  PRN Meds: acetaminophen **OR** acetaminophen, chlordiazePOXIDE, hydrOXYzine, loperamide, ondansetron **OR** ondansetron (ZOFRAN) IV, oxyCODONE, traZODone  Allergies:    Allergies  Allergen Reactions   Benazepril     Angioedema   Naltrexone Other (See Comments)    angry   Nsaids Other (See Comments)    Due to history of ulcers     Social History:   Social History   Socioeconomic History   Marital status: Divorced    Spouse name: Not on file   Number of children: Not on file   Years of education: Not on file   Highest education level: Not on file  Occupational History   Not on file  Tobacco Use   Smoking status: Former    Packs/day: 2.00    Years: 25.00    Pack years: 50.00    Types: Cigarettes    Quit date: 05/31/1987    Years since quitting: 33.6   Smokeless tobacco: Never  Vaping Use   Vaping Use: Never used  Substance and Sexual Activity   Alcohol use: Not Currently    Alcohol/week: 14.0 standard drinks    Types: 14 Cans of beer per week    Comment: former usage. States he drinks around 2 beer a day   Drug use: No   Sexual activity: Yes    Comment: parners birth control  Other Topics Concern   Not on file  Social History Narrative   Not on file   Social  Determinants of Health   Financial Resource Strain: Low Risk    Difficulty of Paying Living Expenses: Not very hard  Food Insecurity: No Food Insecurity   Worried About Charity fundraiser in the Last Year: Never true   Ran Out of Food in the Last Year: Never true  Transportation Needs: No Transportation Needs   Lack of Transportation (Medical): No   Lack of Transportation (Non-Medical): No  Physical Activity: Not on file  Stress: No Stress Concern Present   Feeling of Stress : Only a little  Social Connections: Unknown   Frequency of Communication with Friends and Family: Three times a week   Frequency of Social Gatherings with Friends and Family: Three times a week   Attends Religious Services: 1 to 4 times per year   Active Member of Clubs or Organizations: No   Attends Archivist Meetings: Never   Marital Status: Not on  file  Intimate Partner Violence: Not At Risk   Fear of Current or Ex-Partner: No   Emotionally Abused: No   Physically Abused: No   Sexually Abused: No    Family History:    Family History  Problem Relation Age of Onset   Hypertension Mother    Cancer Mother        Ovarian   Cervical cancer Mother    Hypertension Father    Cancer Father        tongue   Throat cancer Father    Hypertension Brother    Prostate cancer Neg Hx    Breast cancer Neg Hx    Colon cancer Neg Hx    Heart attack Neg Hx    Stroke Neg Hx      ROS:  Please see the history of present illness.   All other ROS reviewed and negative.     Physical Exam/Data:   Vitals:   01/18/21 0926 01/18/21 1234 01/18/21 2031 01/19/21 0412  BP: 139/90 135/90 (!) 149/84 (!) 140/98  Pulse: (!) 118 74 99 (!) 101  Resp: '16  16 17  '$ Temp: 98.3 F (36.8 C)  97.9 F (36.6 C) 98.1 F (36.7 C)  TempSrc: Oral  Oral Oral  SpO2: 94% 97% 98% 97%  Weight:      Height:        Intake/Output Summary (Last 24 hours) at 01/19/2021 1151 Last data filed at 01/19/2021 [id number] Gross per 24 hour   Intake 480 ml  Output --  Net 480 ml   Last 3 Weights 01/18/2021 01/16/2021 01/12/2021  Weight (lbs) 146 lb 13.2 oz 151 lb 14.4 oz 148 lb  Weight (kg) 66.6 kg 68.9 kg 67.132 kg     Body mass index is 23.7 kg/m.  General:  Well nourished, well developed, in no acute distress HEENT: normal Lymph: no adenopathy Neck: no JVD Endocrine:  No thryomegaly Vascular: No carotid bruits; FA pulses 2+ bilaterally without bruits  Cardiac:  normal S1, S2; RRR; no murmur  Lungs:  clear to auscultation bilaterally, no wheezing, rhonchi or rales  Abd: soft, nontender, no hepatomegaly  Ext: no edema Musculoskeletal:  No deformities, BUE and BLE strength normal and equal Skin: warm and dry  Neuro:  CNs 2-12 intact, no focal abnormalities noted Psych:  Normal affect   EKG:  The EKG was personally reviewed and demonstrates: Sinus rhythm with inferior T wave inversion Telemetry:  Telemetry was personally reviewed and demonstrates: Sinus tachycardia at rate of 120 to 150 bpm  Relevant CV Studies: As above  Laboratory Data:  High Sensitivity Troponin:   Recent Labs  Lab 01/16/21 1202 01/16/21 1409 01/16/21 1741 01/16/21 1923  TROPONINIHS 29* 37* 30* 28*     Chemistry Recent Labs  Lab 01/17/21 0248 01/18/21 0423 01/19/21 0447  NA 138 135 138  K 3.1* 3.1* 3.7  CL 100 102 103  CO2 '27 23 22  '$ GLUCOSE 108* 102* 89  BUN '8 8 12  '$ CREATININE 0.54* 0.53* 0.65  CALCIUM 9.3 9.3 10.0  GFRNONAA >60 >60 >60  ANIONGAP '11 10 13    '$ Recent Labs  Lab 01/17/21 0248 01/18/21 0423 01/19/21 0447  PROT 7.7 7.4 8.3*  ALBUMIN 4.2 3.9 4.3  AST 130* 83* 93*  ALT 79* 63* 69*  ALKPHOS 68 65 71  BILITOT 1.3* 1.1 1.6*   Hematology Recent Labs  Lab 01/16/21 1202 01/17/21 0248  WBC 4.8 4.4  RBC 4.77 4.82  HGB 15.6 15.9  HCT  43.8 45.2  MCV 91.8 93.8  MCH 32.7 33.0  MCHC 35.6 35.2  RDW 15.2 15.7*  PLT 179 180    Radiology/Studies:  DG Chest Portable 1 View  Result Date: 01/16/2021 CLINICAL  DATA:  Per EMS, patient from home, c/o 25 pound weight loss in 1 month with decreased PO intake. Fatigue x 3 days. Reports he consumes 5 drinks daily. One shot and one beer today. Hx of liver disease and anemia. EXAM: PORTABLE CHEST 1 VIEW COMPARISON:  02/17/2020 FINDINGS: Normal cardiac silhouette.  No mediastinal or hilar masses. Clear lungs.  No convincing pleural effusion or pneumothorax. Skeletal structures are grossly intact. IMPRESSION: No active disease. Electronically Signed   By: Lajean Manes M.D.   On: 01/16/2021 15:30   ECHOCARDIOGRAM COMPLETE  Result Date: 01/18/2021    ECHOCARDIOGRAM REPORT   Patient Name:   Luis Porter Date of Exam: 01/18/2021 Medical Rec #:  [id number]             Height:       66.0 in Accession #:    [id number]            Weight:       146.8 lb Date of Birth:  1961-03-19             BSA:          1.754 m Patient Age:    78 years              BP:           148/102 mmHg Patient Gender: M                     HR:           127 bpm. Exam Location:  Inpatient Procedure: 2D Echo, Cardiac Doppler and Color Doppler  Results communicated to Dr Darrick Meigs at 16:19 on 01/18/21. Indications:     Syncope  History:         Patient has no prior history of Echocardiogram examinations.                  Risk Factors:Hypertension.  Sonographer:     Luisa Hart RDCS Referring Phys:  Echo Diagnosing Phys: Oswaldo Milian MD IMPRESSIONS  1. Left ventricular ejection fraction, by estimation, is 20 to 25%. The left ventricle has severely decreased function. The left ventricle demonstrates global hypokinesis. There is mild left ventricular hypertrophy. Left ventricular diastolic parameters  are consistent with Grade I diastolic dysfunction (impaired relaxation).  2. Right ventricular systolic function is normal. The right ventricular size is normal.  3. The mitral valve is normal in structure. No evidence of mitral valve regurgitation. No evidence of mitral stenosis.  4. The aortic  valve was not well visualized. Aortic valve regurgitation is not visualized. No aortic stenosis is present. FINDINGS  Left Ventricle: Left ventricular ejection fraction, by estimation, is 20 to 25%. The left ventricle has severely decreased function. The left ventricle demonstrates global hypokinesis. The left ventricular internal cavity size was normal in size. There is mild left ventricular hypertrophy. Left ventricular diastolic parameters are consistent with Grade I diastolic dysfunction (impaired relaxation). Right Ventricle: The right ventricular size is normal. No increase in right ventricular wall thickness. Right ventricular systolic function is normal. Left Atrium: Left atrial size was normal in size. Right Atrium: Right atrial size was normal in size. Pericardium: Trivial pericardial effusion is present. Mitral Valve: The mitral valve is normal  in structure. No evidence of mitral valve regurgitation. No evidence of mitral valve stenosis. Tricuspid Valve: The tricuspid valve is normal in structure. Tricuspid valve regurgitation is trivial. Aortic Valve: The aortic valve was not well visualized. Aortic valve regurgitation is not visualized. No aortic stenosis is present. Aortic valve mean gradient measures 2.0 mmHg. Aortic valve peak gradient measures 2.8 mmHg. Aortic valve area, by VTI measures 4.22 cm. Pulmonic Valve: The pulmonic valve was grossly normal. Pulmonic valve regurgitation is not visualized. Aorta: The aortic root and ascending aorta are structurally normal, with no evidence of dilitation. IAS/Shunts: The interatrial septum was not well visualized.  LEFT VENTRICLE PLAX 2D LVIDd:         5.20 cm     Diastology LVIDs:         4.40 cm     LV e' medial:    5.55 cm/s LV PW:         1.00 cm     LV E/e' medial:  4.8 LV IVS:        1.00 cm     LV e' lateral:   8.58 cm/s LVOT diam:     2.60 cm     LV E/e' lateral: 3.1 LV SV:         45 LV SV Index:   25 LVOT Area:     5.31 cm  LV Volumes (MOD) LV vol  d, MOD A2C: 76.7 ml LV vol d, MOD A4C: 59.7 ml LV vol s, MOD A2C: 59.0 ml LV vol s, MOD A4C: 56.5 ml LV SV MOD A2C:     17.7 ml LV SV MOD A4C:     59.7 ml LV SV MOD BP:      11.8 ml RIGHT VENTRICLE RV S prime:     11.70 cm/s TAPSE (M-mode): 1.7 cm LEFT ATRIUM             Index LA diam:        2.00 cm 1.14 cm/m LA Vol (A2C):   19.0 ml 10.83 ml/m LA Vol (A4C):   11.6 ml 6.61 ml/m LA Biplane Vol: 15.3 ml 8.72 ml/m  AORTIC VALVE                   PULMONIC VALVE AV Area (Vmax):    3.99 cm    PV Vmax:       0.73 m/s AV Area (Vmean):   3.96 cm    PV Vmean:      45.800 cm/s AV Area (VTI):     4.22 cm    PV VTI:        0.080 m AV Vmax:           83.60 cm/s  PV Peak grad:  2.1 mmHg AV Vmean:          56.500 cm/s PV Mean grad:  1.0 mmHg AV VTI:            0.106 m AV Peak Grad:      2.8 mmHg AV Mean Grad:      2.0 mmHg LVOT Vmax:         62.90 cm/s LVOT Vmean:        42.100 cm/s LVOT VTI:          0.084 m LVOT/AV VTI ratio: 0.79  AORTA Ao Root diam: 3.20 cm Ao Asc diam:  3.30 cm MITRAL VALVE               TRICUSPID VALVE MV  Area (PHT): 8.25 cm    TR Peak grad:   16.5 mmHg MV Decel Time: 92 msec     TR Vmax:        203.00 cm/s MV E velocity: 26.80 cm/s MV A velocity: 64.50 cm/s  SHUNTS MV E/A ratio:  0.42        Systemic VTI:  0.08 m                            Systemic Diam: 2.60 cm Oswaldo Milian MD Electronically signed by Oswaldo Milian MD Signature Date/Time: 01/18/2021/4:20:18 PM    Final (Updated)      Assessment and Plan:   Acute combined CHF - Echocardiogram showed LV function of 20 to 25%, global hypokinesis, and grade 1 diastolic dysfunction.  Mild LVH. 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  Also has progressive worsening exertional dyspnea and chest tightness -We will plan left and right cardiac catheterization tomorrow. -Stop amlodipine -Start Toprol-XL 50  mg daily today -Plan to start Entresto post catheterization tomorrow -Hold adding statin and aspirin (LFTs improving)  2.  Syncope with questionable LOC -Likely related to vasovagal versus alcohol  3.  Hypokalemia -Resolved  For questions or updates, please contact [place] Please consult [email or web address] for contact info under    Jarrett Soho, PA  01/19/2021 11:51 AM

## 2021-01-20 ENCOUNTER — Encounter (HOSPITAL_COMMUNITY)
Admission: EM | Disposition: A | Discharge status: Home / Self Care | Payer: Self-pay | Source: Home / Self Care | Attending: Family Medicine

## 2021-01-20 DIAGNOSIS — N401 Enlarged prostate with lower urinary tract symptoms: Secondary | ICD-10-CM

## 2021-01-20 DIAGNOSIS — I426 Alcoholic cardiomyopathy: Secondary | ICD-10-CM

## 2021-01-20 DIAGNOSIS — R55 Syncope and collapse: Secondary | ICD-10-CM

## 2021-01-20 DIAGNOSIS — I420 Dilated cardiomyopathy: Secondary | ICD-10-CM | POA: Diagnosis not present

## 2021-01-20 DIAGNOSIS — R778 Other specified abnormalities of plasma proteins: Secondary | ICD-10-CM | POA: Diagnosis not present

## 2021-01-20 DIAGNOSIS — F1010 Alcohol abuse, uncomplicated: Secondary | ICD-10-CM | POA: Diagnosis not present

## 2021-01-20 DIAGNOSIS — I429 Cardiomyopathy, unspecified: Secondary | ICD-10-CM

## 2021-01-20 DIAGNOSIS — I10 Essential (primary) hypertension: Secondary | ICD-10-CM | POA: Diagnosis not present

## 2021-01-20 DIAGNOSIS — N138 Other obstructive and reflux uropathy: Secondary | ICD-10-CM

## 2021-01-20 DIAGNOSIS — I5043 Acute on chronic combined systolic (congestive) and diastolic (congestive) heart failure: Secondary | ICD-10-CM

## 2021-01-20 HISTORY — PX: RIGHT/LEFT HEART CATH AND CORONARY ANGIOGRAPHY: CATH118266

## 2021-01-20 LAB — POCT I-STAT EG7
Acid-Base Excess: 0 mmol/L (ref 0.0–2.0)
Acid-base deficit: 1 mmol/L (ref 0.0–2.0)
Bicarbonate: 24.7 mmol/L (ref 20.0–28.0)
Bicarbonate: 25 mmol/L (ref 20.0–28.0)
Calcium, Ion: 1.3 mmol/L (ref 1.15–1.40)
Calcium, Ion: 1.3 mmol/L (ref 1.15–1.40)
HCT: 44 % (ref 39.0–52.0)
HCT: 44 % (ref 39.0–52.0)
Hemoglobin: 15 g/dL (ref 13.0–17.0)
Hemoglobin: 15 g/dL (ref 13.0–17.0)
O2 Saturation: 64 %
O2 Saturation: 67 %
Potassium: 3.6 mmol/L (ref 3.5–5.1)
Potassium: 3.7 mmol/L (ref 3.5–5.1)
Sodium: 136 mmol/L (ref 135–145)
Sodium: 136 mmol/L (ref 135–145)
TCO2: 26 mmol/L (ref 22–32)
TCO2: 26 mmol/L (ref 22–32)
pCO2, Ven: 43.1 mmHg — ABNORMAL LOW (ref 44.0–60.0)
pCO2, Ven: 43.4 mmHg — ABNORMAL LOW (ref 44.0–60.0)
pH, Ven: 7.363 (ref 7.250–7.430)
pH, Ven: 7.372 (ref 7.250–7.430)
pO2, Ven: 35 mmHg (ref 32.0–45.0)
pO2, Ven: 36 mmHg (ref 32.0–45.0)

## 2021-01-20 LAB — HEPATIC FUNCTION PANEL
ALT: 59 U/L — ABNORMAL HIGH (ref 0–44)
AST: 58 U/L — ABNORMAL HIGH (ref 15–41)
Albumin: 4.1 g/dL (ref 3.5–5.0)
Alkaline Phosphatase: 68 U/L (ref 38–126)
Bilirubin, Direct: 0.2 mg/dL (ref 0.0–0.2)
Indirect Bilirubin: 1.1 mg/dL — ABNORMAL HIGH (ref 0.3–0.9)
Total Bilirubin: 1.3 mg/dL — ABNORMAL HIGH (ref 0.3–1.2)
Total Protein: 8 g/dL (ref 6.5–8.1)

## 2021-01-20 LAB — POCT I-STAT 7, (LYTES, BLD GAS, ICA,H+H)
Acid-Base Excess: 0 mmol/L (ref 0.0–2.0)
Bicarbonate: 24.2 mmol/L (ref 20.0–28.0)
Calcium, Ion: 1.31 mmol/L (ref 1.15–1.40)
HCT: 43 % (ref 39.0–52.0)
Hemoglobin: 14.6 g/dL (ref 13.0–17.0)
O2 Saturation: 99 %
Potassium: 3.6 mmol/L (ref 3.5–5.1)
Sodium: 135 mmol/L (ref 135–145)
TCO2: 25 mmol/L (ref 22–32)
pCO2 arterial: 37.7 mmHg (ref 32.0–48.0)
pH, Arterial: 7.415 (ref 7.350–7.450)
pO2, Arterial: 148 mmHg — ABNORMAL HIGH (ref 83.0–108.0)

## 2021-01-20 LAB — CBC
HCT: 48.2 % (ref 39.0–52.0)
Hemoglobin: 16.5 g/dL (ref 13.0–17.0)
MCH: 33.2 pg (ref 26.0–34.0)
MCHC: 34.2 g/dL (ref 30.0–36.0)
MCV: 97 fL (ref 80.0–100.0)
Platelets: 201 10*3/uL (ref 150–400)
RBC: 4.97 MIL/uL (ref 4.22–5.81)
RDW: 14.8 % (ref 11.5–15.5)
WBC: 9.7 10*3/uL (ref 4.0–10.5)
nRBC: 0 % (ref 0.0–0.2)

## 2021-01-20 LAB — BASIC METABOLIC PANEL
Anion gap: 10 (ref 5–15)
BUN: 17 mg/dL (ref 6–20)
CO2: 26 mmol/L (ref 22–32)
Calcium: 9.6 mg/dL (ref 8.9–10.3)
Chloride: 99 mmol/L (ref 98–111)
Creatinine, Ser: 0.75 mg/dL (ref 0.61–1.24)
GFR, Estimated: >60 mL/min (ref >60)
Glucose, Bld: 109 mg/dL — ABNORMAL HIGH (ref 70–99)
Potassium: 3.6 mmol/L (ref 3.5–5.1)
Sodium: 135 mmol/L (ref 135–145)

## 2021-01-20 SURGERY — RIGHT/LEFT HEART CATH AND CORONARY ANGIOGRAPHY
Anesthesia: LOCAL

## 2021-01-20 MED ORDER — SODIUM CHLORIDE 0.9 % IV SOLN
INTRAVENOUS | Status: DC
Start: 2021-01-21 — End: 2021-01-20

## 2021-01-20 MED ORDER — ASPIRIN 81 MG PO CHEW: 81.0000 mg | CHEWABLE_TABLET | ORAL | Status: DC

## 2021-01-20 MED ORDER — HEPARIN (PORCINE) IN NACL 1000-0.9 UT/500ML-% IV SOLN
INTRAVENOUS | Status: AC
  Filled 2021-01-20: qty 500

## 2021-01-20 MED ORDER — ASPIRIN 81 MG PO CHEW
81.0000 mg | CHEWABLE_TABLET | ORAL | Status: AC
  Administered 2021-01-20: 81 mg via ORAL
  Filled 2021-01-20: qty 1

## 2021-01-20 MED ORDER — VERAPAMIL HCL 2.5 MG/ML IV SOLN
INTRAVENOUS | Status: DC | PRN
  Administered 2021-01-20: 10 mL via INTRA_ARTERIAL

## 2021-01-20 MED ORDER — SODIUM CHLORIDE 0.9 % IV SOLN: INTRAVENOUS | Status: DC

## 2021-01-20 MED ORDER — LIDOCAINE HCL (PF) 1 % IJ SOLN
INTRAMUSCULAR | Status: AC
  Filled 2021-01-20: qty 30

## 2021-01-20 MED ORDER — SODIUM CHLORIDE 0.9% FLUSH: 3.0000 mL | INTRAVENOUS | Status: DC | PRN

## 2021-01-20 MED ORDER — SODIUM CHLORIDE 0.9 % IV SOLN: 250.0000 mL | INTRAVENOUS | Status: DC | PRN

## 2021-01-20 MED ORDER — FENTANYL CITRATE (PF) 100 MCG/2ML IJ SOLN
INTRAMUSCULAR | Status: DC | PRN
  Administered 2021-01-20 (×2): 25 ug via INTRAVENOUS

## 2021-01-20 MED ORDER — IOHEXOL 350 MG/ML SOLN
INTRAVENOUS | Status: DC | PRN
  Administered 2021-01-20: 30 mL via INTRA_ARTERIAL

## 2021-01-20 MED ORDER — HEPARIN SODIUM (PORCINE) 1000 UNIT/ML IJ SOLN
INTRAMUSCULAR | Status: DC | PRN
  Administered 2021-01-20: 3500 [IU] via INTRAVENOUS

## 2021-01-20 MED ORDER — HEPARIN (PORCINE) IN NACL 1000-0.9 UT/500ML-% IV SOLN
INTRAVENOUS | Status: DC | PRN
  Administered 2021-01-20 (×2): 500 mL

## 2021-01-20 MED ORDER — SPIRONOLACTONE 12.5 MG HALF TABLET
12.5000 mg | ORAL_TABLET | Freq: Every day | ORAL | Status: DC
  Administered 2021-01-20 – 2021-01-21 (×2): 12.5 mg via ORAL
  Filled 2021-01-20 (×2): qty 1

## 2021-01-20 MED ORDER — MIDAZOLAM HCL 2 MG/2ML IJ SOLN
INTRAMUSCULAR | Status: DC | PRN
  Administered 2021-01-20 (×2): 1 mg via INTRAVENOUS

## 2021-01-20 MED ORDER — MIDAZOLAM HCL 2 MG/2ML IJ SOLN
INTRAMUSCULAR | Status: AC
  Filled 2021-01-20: qty 2

## 2021-01-20 MED ORDER — FENTANYL CITRATE (PF) 100 MCG/2ML IJ SOLN
INTRAMUSCULAR | Status: AC
  Filled 2021-01-20: qty 2

## 2021-01-20 MED ORDER — SACUBITRIL-VALSARTAN 24-26 MG PO TABS
1.0000 | ORAL_TABLET | Freq: Two times a day (BID) | ORAL | Status: DC
  Administered 2021-01-20 – 2021-01-21 (×3): 1 via ORAL
  Filled 2021-01-20 (×3): qty 1

## 2021-01-20 MED ORDER — HEPARIN SODIUM (PORCINE) 1000 UNIT/ML IJ SOLN
INTRAMUSCULAR | Status: AC
  Filled 2021-01-20: qty 1

## 2021-01-20 MED ORDER — VERAPAMIL HCL 2.5 MG/ML IV SOLN
INTRAVENOUS | Status: AC
  Filled 2021-01-20: qty 2

## 2021-01-20 MED ORDER — LIDOCAINE HCL (PF) 1 % IJ SOLN
INTRAMUSCULAR | Status: DC | PRN
  Administered 2021-01-20 (×2): 2 mL

## 2021-01-20 SURGICAL SUPPLY — 13 items
CATH BALLN WEDGE 5F 110CM (CATHETERS) ×2 IMPLANT
CATH OPTITORQUE TIG 4.0 5F (CATHETERS) ×2 IMPLANT
DEVICE RAD COMP TR BAND LRG (VASCULAR PRODUCTS) ×2 IMPLANT
GLIDESHEATH SLEND SS 6F .021 (SHEATH) ×2 IMPLANT
GUIDEWIRE .025 260CM (WIRE) IMPLANT
GUIDEWIRE INQWIRE 1.5J.035X260 (WIRE) ×1 IMPLANT
INQWIRE 1.5J .035X260CM (WIRE) ×2
KIT HEART LEFT (KITS) ×2 IMPLANT
PACK CARDIAC CATHETERIZATION (CUSTOM PROCEDURE TRAY) ×2 IMPLANT
SHEATH GLIDE SLENDER 4/5FR (SHEATH) ×2 IMPLANT
SHEATH PROBE COVER 6X72 (BAG) ×2 IMPLANT
TRANSDUCER W/STOPCOCK (MISCELLANEOUS) ×2 IMPLANT
TUBING CIL FLEX 10 FLL-RA (TUBING) ×2 IMPLANT

## 2021-01-20 NOTE — TOC Benefit Eligibility Note (Deleted)
                             Ross Ludwig, LCSW Phone Number: 01/20/2021, 4:54 PM

## 2021-01-20 NOTE — Plan of Care (Signed)
  Problem: Education: Goal: Knowledge of disease or condition will improve Outcome: Progressing   Problem: Health Behavior/Discharge Planning: Goal: Ability to identify changes in lifestyle to reduce recurrence of condition will improve Outcome: Progressing Goal: Identification of resources available to assist in meeting health care needs will improve Outcome: Progressing   Problem: Physical Regulation: Goal: Complications related to the disease process, condition or treatment will be avoided or minimized Outcome: Progressing   Problem: Safety: Goal: Ability to remain free from injury will improve Outcome: Progressing   Problem: Education: Goal: Knowledge of General Education information will improve Description: Including pain rating scale, medication(s)/side effects and non-pharmacologic comfort measures Outcome: Progressing   Problem: Health Behavior/Discharge Planning: Goal: Ability to manage health-related needs will improve Outcome: Progressing   Problem: Clinical Measurements: Goal: Ability to maintain clinical measurements within normal limits will improve Outcome: Progressing Goal: Diagnostic test results will improve Outcome: Progressing Goal: Respiratory complications will improve Outcome: Progressing   Problem: Activity: Goal: Risk for activity intolerance will decrease Outcome: Progressing

## 2021-01-20 NOTE — Progress Notes (Signed)
PROGRESS NOTE    Luis Porter  [id number] DOB: 20-Feb-1961 DOA: 01/16/2021 PCP: Olin Hauser, DO  Brief Narrative:  Patient is a 60 year old Caucasian male with a past medical history significant for but not limited to alcohol use disorder, fatty liver disease, chronic gastritis, BPH, hypertension as well as other comorbidities who presented with 2 episodes of syncope.  In the ED lab work showed mild elevation in AST and ALT and alcohol level 358.  EKG showed sinus rhythm with borderline inferior T wave abnormalities but no significant changes.  Further work-up was done and I will was showing an EF of 25%.  Cardiology was consulted and recommended cardiac catheterization which was done today.  He was IVCs the day before yesterday due to worsening hallucinations and trying to leave the hospital but now will rescind IVC given that he is out of withdrawals and improved.  Assessment & Plan:   Active Problems:   Essential hypertension   BPH with obstruction/lower urinary tract symptoms   Alcohol abuse, daily use   Alcohol withdrawal syndrome without complication (HCC)   Alcohol induced fatty liver   Chronic alcoholic gastritis without hemorrhage   Elevated troponin   Syncope   Cardiomyopathy ([place])  Acute combined systolic and diastolic CHF Dilated cardiomyopathy -Echocardiogram done showed an LV function of 20 to 25% and global hypokinesis and grade 1 diastolic CHF as well as mild LVH -Cardiology believes that this is likely alcohol induced cardiomyopathy but  he has cardiac risk factors of hypertension, tobacco abuse and heavy alcohol abuse -Patient presented with progressive worsening exertional dyspnea and chest tightness so this was suspicious for underlying CAD -Cardiology had recommended a left and right coronary cardiac catheterization and this was done and this was normal -They started Toprol 50 mg p.o. daily and added Entresto 24-26 mg twice daily and  spironolactone 12.5 mg daily and recommended holding statin -Because of his cath findings and multiple typical spells and difficulty apparently with this is primary arrhythmogenic cause in setting of nonischemic cardiomyopathy due to alcohol recommendation was to place a LifeVest as below and cardiology will reassess left ventricular ejection fraction in 3 months after treatment on maximal medical therapy  Syncope with questionable loss of consciousness -Likely vasovagal or related to alcohol; telemetry showed normal sinus rhythm -EKG showed borderline T wave abnormalities in leads II, III and aVF -Troponin was elevated at 37, 30, 28 -Echocardiogram obtained shows EF 25% with global hypokinesis -Cardiology has been consulted and he underwent a left and right heart catheterization which was normal -Cardiology initially recommended a 30-day event monitor as an outpatient but now recommending keeping the patient and placing of LifeVest   Alcohol Withdrawal Syndrome -Patient went into alcohol withdrawal delirium on 01/18/2021 -He had been tremulous -Confused, agitated, having hallucinations -He was initially started on started on Ativan per CIWA protocol -CIWA score got as high as 26 -Continue thiamine, folic acid -Ativan was not effective so it was discontinued and patient started on Librium detox protocol and he is improving on this and to get 25 mg p.o. every morning nightly for 2 doses -Patient was trying to leave hospital 8/22, he is risk to self as he has no medical decision-making capacity due to delirium due to alcohol withdrawal.  So IVC paperwork was initiated and patient was involuntarily committed in the hospital.  IVC will be rescinded given that he is much improved and sitter will be discontinued   Hypokalemia -Improved patient's potassium was 3.1 is now 3.6 -  Check magnesium level in the a.m. -Continue monitor and replete as necessary -Repeat CMP in a.m.  Abnormal LFTs   Hyperbilirubinemia -Patient's AST and ALT were elevated and now trending down and last AST was 58 and ALT was 59 - T bili is mildly elevated at 1 from 1.6 and trended down 1.2 with a direct of 1.1 and a direct of 0.2   Hypertension -Amlodipine has been discontinued on his been started on Toprol-XL and Entresto 24-26 1 tab p.o. twice daily -Cardiology also recommending starting spironolactone 12.5 mg p.o. daily -Continue monitor blood pressures per protocol -Last blood pressure reading was   Chronic gastritis/GERD -Continue PPI with pantoprazole 80 mg p.o. daily   DVT prophylaxis: Enoxaparin 40 mg subcu q. 24 Code Status: FULL CODE Family Communication: Discussed with wife at bedside  Disposition Plan: Pending further clinical improvement and clearance by cardiology; cardiology recommends observing overnight as patient will be getting a LifeVest  Status is: Inpatient  Remains inpatient appropriate because:Unsafe d/c plan, IV treatments appropriate due to intensity of illness or inability to take PO, and Inpatient level of care appropriate due to severity of illness  Dispo: The patient is from: Home              Anticipated d/c is to: Home              Patient currently is not medically stable to d/c.   Difficult to place patient No  Consultants:  Cardiology  Procedures:  ECHOCARDIOGRAM IMPRESSIONS     1. Left ventricular ejection fraction, by estimation, is 20 to 25%. The  left ventricle has severely decreased function. The left ventricle  demonstrates global hypokinesis. There is mild left ventricular  hypertrophy. Left ventricular diastolic parameters   are consistent with Grade I diastolic dysfunction (impaired relaxation).   2. Right ventricular systolic function is normal. The right ventricular  size is normal.   3. The mitral valve is normal in structure. No evidence of mitral valve  regurgitation. No evidence of mitral stenosis.   4. The aortic valve was not well  visualized. Aortic valve regurgitation  is not visualized. No aortic stenosis is present.   FINDINGS   Left Ventricle: Left ventricular ejection fraction, by estimation, is 20  to 25%. The left ventricle has severely decreased function. The left  ventricle demonstrates global hypokinesis. The left ventricular internal  cavity size was normal in size. There  is mild left ventricular hypertrophy. Left ventricular diastolic  parameters are consistent with Grade I diastolic dysfunction (impaired  relaxation).   Right Ventricle: The right ventricular size is normal. No increase in  right ventricular wall thickness. Right ventricular systolic function is  normal.   Left Atrium: Left atrial size was normal in size.   Right Atrium: Right atrial size was normal in size.   Pericardium: Trivial pericardial effusion is present.   Mitral Valve: The mitral valve is normal in structure. No evidence of  mitral valve regurgitation. No evidence of mitral valve stenosis.   Tricuspid Valve: The tricuspid valve is normal in structure. Tricuspid  valve regurgitation is trivial.   Aortic Valve: The aortic valve was not well visualized. Aortic valve  regurgitation is not visualized. No aortic stenosis is present. Aortic  valve mean gradient measures 2.0 mmHg. Aortic valve peak gradient measures  2.8 mmHg. Aortic valve area, by VTI  measures 4.22 cm.   Pulmonic Valve: The pulmonic valve was grossly normal. Pulmonic valve  regurgitation is not visualized.   Aorta:  The aortic root and ascending aorta are structurally normal, with  no evidence of dilitation.   IAS/Shunts: The interatrial septum was not well visualized.      LEFT VENTRICLE  PLAX 2D  LVIDd:         5.20 cm     Diastology  LVIDs:         4.40 cm     LV e' medial:    5.55 cm/s  LV PW:         1.00 cm     LV E/e' medial:  4.8  LV IVS:        1.00 cm     LV e' lateral:   8.58 cm/s  LVOT diam:     2.60 cm     LV E/e' lateral: 3.1  LV  SV:         45  LV SV Index:   25  LVOT Area:     5.31 cm     LV Volumes (MOD)  LV vol d, MOD A2C: 76.7 ml  LV vol d, MOD A4C: 59.7 ml  LV vol s, MOD A2C: 59.0 ml  LV vol s, MOD A4C: 56.5 ml  LV SV MOD A2C:     17.7 ml  LV SV MOD A4C:     59.7 ml  LV SV MOD BP:      11.8 ml   RIGHT VENTRICLE  RV S prime:     11.70 cm/s  TAPSE (M-mode): 1.7 cm   LEFT ATRIUM             Index  LA diam:        2.00 cm 1.14 cm/m  LA Vol (A2C):   19.0 ml 10.83 ml/m  LA Vol (A4C):   11.6 ml 6.61 ml/m  LA Biplane Vol: 15.3 ml 8.72 ml/m   AORTIC VALVE                   PULMONIC VALVE  AV Area (Vmax):    3.99 cm    PV Vmax:       0.73 m/s  AV Area (Vmean):   3.96 cm    PV Vmean:      45.800 cm/s  AV Area (VTI):     4.22 cm    PV VTI:        0.080 m  AV Vmax:           83.60 cm/s  PV Peak grad:  2.1 mmHg  AV Vmean:          56.500 cm/s PV Mean grad:  1.0 mmHg  AV VTI:            0.106 m  AV Peak Grad:      2.8 mmHg  AV Mean Grad:      2.0 mmHg  LVOT Vmax:         62.90 cm/s  LVOT Vmean:        42.100 cm/s  LVOT VTI:          0.084 m  LVOT/AV VTI ratio: 0.79     AORTA  Ao Root diam: 3.20 cm  Ao Asc diam:  3.30 cm   MITRAL VALVE               TRICUSPID VALVE  MV Area (PHT): 8.25 cm    TR Peak grad:   16.5 mmHg  MV Decel Time: 92 msec     TR Vmax:  203.00 cm/s  MV E velocity: 26.80 cm/s  MV A velocity: 64.50 cm/s  SHUNTS  MV E/A ratio:  0.42        Systemic VTI:  0.08 m                             Systemic Diam: 2.60 cm   CARDIAC CATH Normal large epicardial coronary arteries with a left main trifurcating into an LAD, large ramus intermediate vessel, and left circumflex vessel.  Normal dominant RCA.   Low right heart pressures.   In this patient with EF at 20 to 25%, findings are consistent with a nonischemic cardiomyopathy.  With his history of significant EtOH use, consider alcohol etiology.   RECOMMENATION: Guideline directed medical therapy for HFrEF as blood pressure  allows.  Consider possible arrhythmic etiology to syncope versus hypotension.  He has a ready for Mannam sinus node   Antimicrobials:  Anti-infectives (From admission, onward)    None        Subjective: Seen and examined at bedside and he is hungry.  Wanting to eat but knows he needs to go for his cath.  No chest pain or shortness of breath.  Has any lightheadedness or dizziness.  No other concerns or complaints this time.  Objective: Vitals:   01/20/21 1251 01/20/21 1256 01/20/21 1310 01/20/21 1642  BP: 112/76 (!) 129/94 (!) 152/75 134/82  Pulse: 94 93 82 91  Resp: 16 (!) '22 19 16  '$ Temp:      TempSrc:      SpO2: 97% 98% 99% 98%  Weight:      Height:        Intake/Output Summary (Last 24 hours) at 01/20/2021 1723 Last data filed at 01/20/2021 [id number] Gross per 24 hour  Intake 9.52 ml  Output --  Net 9.52 ml   Filed Weights   01/16/21 2300 01/18/21 0536  Weight: 68.9 kg 66.6 kg   Examination: Physical Exam:  Constitutional: WN/WD Caucasian male currently no acute distress appears calm Eyes: Lids and conjunctivae normal, sclerae anicteric  ENMT: External Ears, Nose appear normal.  Neck: Appears normal, supple, no cervical masses, normal ROM, no appreciable thyromegaly; no JVD Respiratory: Diminished to auscultation bilaterally, no wheezing, rales, rhonchi or crackles. Normal respiratory effort and patient is not tachypenic. No accessory muscle use.  Unlabored breathing Cardiovascular: RRR, no murmurs / rubs / gallops. S1 and S2 auscultated. No extremity edema.  Abdomen: Soft, non-tender, non-distended. Bowel sounds positive.  GU: Deferred. Musculoskeletal: No clubbing / cyanosis of digits/nails. No joint deformity upper and lower extremities.  Skin: No rashes, lesions, ulcers on limited skin evaluation but does have some facial erythema. No induration; Warm and dry.  Neurologic: CN 2-12 grossly intact with no focal deficits. Romberg sign and cerebellar reflexes not  assessed.  Psychiatric: Normal judgment and insight. Alert and oriented x 3. Normal mood and appropriate affect.   Data Reviewed: I have personally reviewed following labs and imaging studies  CBC: Recent Labs  Lab 01/16/21 1202 01/17/21 0248 01/20/21 0508  WBC 4.8 4.4 9.7  NEUTROABS 2.2  --   --   HGB 15.6 15.9 16.5  HCT 43.8 45.2 48.2  MCV 91.8 93.8 97.0  PLT 179 180 [id number]   Basic Metabolic Panel: Recent Labs  Lab 01/16/21 1202 01/16/21 1235 01/17/21 0248 01/18/21 0423 01/19/21 0447 01/20/21 0508  NA  --  141 138 135 138 135  K  --  3.4*  3.1* 3.1* 3.7 3.6  CL  --  106 100 102 103 99  CO2  --  '22 27 23 22 26  '$ GLUCOSE  --  85 108* 102* 89 109*  BUN  --  '6 8 8 12 17  '$ CREATININE  --  0.59* 0.54* 0.53* 0.65 0.75  CALCIUM  --  7.6* 9.3 9.3 10.0 9.6  MG 2.0  --  1.8  --   --   --   PHOS  --   --  3.1  --   --   --    GFR: Estimated Creatinine Clearance: 89.7 mL/min (by C-G formula based on SCr of 0.75 mg/dL). Liver Function Tests: Recent Labs  Lab 01/16/21 1235 01/17/21 0248 01/18/21 0423 01/19/21 0447 01/20/21 0508  AST 123* 130* 83* 93* 58*  ALT 71* 79* 63* 69* 59*  ALKPHOS 56 68 65 71 68  BILITOT 0.8 1.3* 1.1 1.6* 1.3*  PROT 6.6 7.7 7.4 8.3* 8.0  ALBUMIN 3.5 4.2 3.9 4.3 4.1   No results for input(s): LIPASE, AMYLASE in the last 168 hours. No results for input(s): AMMONIA in the last 168 hours. Coagulation Profile: No results for input(s): INR, PROTIME in the last 168 hours. Cardiac Enzymes: No results for input(s): CKTOTAL, CKMB, CKMBINDEX, TROPONINI in the last 168 hours. BNP (last 3 results) No results for input(s): PROBNP in the last 8760 hours. HbA1C: No results for input(s): HGBA1C in the last 72 hours. CBG: Recent Labs  Lab 01/16/21 1202  GLUCAP 100*   Lipid Profile: No results for input(s): CHOL, HDL, LDLCALC, TRIG, CHOLHDL, LDLDIRECT in the last 72 hours. Thyroid Function Tests: No results for input(s): TSH, T4TOTAL, FREET4, T3FREE,  THYROIDAB in the last 72 hours. Anemia Panel: No results for input(s): VITAMINB12, FOLATE, FERRITIN, TIBC, IRON, RETICCTPCT in the last 72 hours. Sepsis Labs: No results for input(s): PROCALCITON, LATICACIDVEN in the last 168 hours.  Recent Results (from the past 240 hour(s))  Resp Panel by RT-PCR (Flu A&B, Covid) Nasopharyngeal Swab     Status: None   Collection Time: 01/16/21  5:41 PM   Specimen: Nasopharyngeal Swab; Nasopharyngeal(NP) swabs in vial transport medium  Result Value Ref Range Status   SARS Coronavirus 2 by RT PCR NEGATIVE NEGATIVE Final    Comment: (NOTE) SARS-CoV-2 target nucleic acids are NOT DETECTED.  The SARS-CoV-2 RNA is generally detectable in upper respiratory specimens during the acute phase of infection. The lowest concentration of SARS-CoV-2 viral copies this assay can detect is 138 copies/mL. A negative result does not preclude SARS-Cov-2 infection and should not be used as the sole basis for treatment or other patient management decisions. A negative result may occur with  improper specimen collection/handling, submission of specimen other than nasopharyngeal swab, presence of viral mutation(s) within the areas targeted by this assay, and inadequate number of viral copies(<138 copies/mL). A negative result must be combined with clinical observations, patient history, and epidemiological information. The expected result is Negative.  Fact Sheet for Patients:  EntrepreneurPulse.com.au  Fact Sheet for Healthcare Providers:  IncredibleEmployment.be  This test is no t yet approved or cleared by the [place] FDA and  has been authorized for detection and/or diagnosis of SARS-CoV-2 by FDA under an Emergency Use Authorization (EUA). This EUA will remain  in effect (meaning this test can be used) for the duration of the COVID-19 declaration under Section 564(b)(1) of the Act, 21 U.S.C.section 360bbb-3(b)(1), unless the  authorization is terminated  or revoked sooner.  Influenza A by PCR NEGATIVE NEGATIVE Final   Influenza B by PCR NEGATIVE NEGATIVE Final    Comment: (NOTE) The Xpert Xpress SARS-CoV-2/FLU/RSV plus assay is intended as an aid in the diagnosis of influenza from Nasopharyngeal swab specimens and should not be used as a sole basis for treatment. Nasal washings and aspirates are unacceptable for Xpert Xpress SARS-CoV-2/FLU/RSV testing.  Fact Sheet for Patients: EntrepreneurPulse.com.au  Fact Sheet for Healthcare Providers: IncredibleEmployment.be  This test is not yet approved or cleared by the [place] FDA and has been authorized for detection and/or diagnosis of SARS-CoV-2 by FDA under an Emergency Use Authorization (EUA). This EUA will remain in effect (meaning this test can be used) for the duration of the COVID-19 declaration under Section 564(b)(1) of the Act, 21 U.S.C. section 360bbb-3(b)(1), unless the authorization is terminated or revoked.  Performed at [facility], [place] [address] [place], [place] [address]     RN Pressure Injury Documentation:     Estimated body mass index is 23.7 kg/m as calculated from the following:   Height as of this encounter: '5\' 6"'$  (1.676 m).   Weight as of this encounter: 66.6 kg.  Malnutrition Type:   Malnutrition Characteristics:   Nutrition Interventions:    Radiology Studies: CARDIAC CATHETERIZATION  Result Date: 01/20/2021 Normal large epicardial coronary arteries with a left main trifurcating into an LAD, large ramus intermediate vessel, and left circumflex vessel.  Normal dominant RCA. Low right heart pressures. In this patient with EF at 20 to 25%, findings are consistent with a nonischemic cardiomyopathy.  With his history of significant EtOH use, consider alcohol etiology. RECOMMENATION: Guideline directed medical therapy for HFrEF as blood pressure allows.   Consider possible arrhythmic etiology to syncope versus hypotension.  He has a ready for Mannam sinus node    Scheduled Meds:  aspirin  324 mg Oral Once   aspirin EC  81 mg Oral Daily   chlordiazePOXIDE  25 mg Oral BH-qamhs   Followed by   Derrill Memo ON 01/21/2021] chlordiazePOXIDE  25 mg Oral Daily   enoxaparin (LOVENOX) injection  40 mg Subcutaneous [id number]   folic acid  1 mg Oral Daily   hydrocortisone cream   Topical TID   metoprolol succinate  50 mg Oral Daily   multivitamin with minerals  1 tablet Oral Daily   pantoprazole  80 mg Oral Daily   sacubitril-valsartan  1 tablet Oral BID   sodium chloride flush  3 mL Intravenous Q12H   spironolactone  12.5 mg Oral Daily   thiamine  100 mg Oral Daily   Or   thiamine  100 mg Intravenous Daily   Continuous Infusions:   LOS: 2 days   Kerney Elbe, DO [facility] PAGER is on AMION  If 7PM-7AM, please contact night-coverage [email or web address]

## 2021-01-20 NOTE — Progress Notes (Signed)
Cardiac Cath results reviewed:  Conclusion  Normal large epicardial coronary arteries with a left main trifurcating into an LAD, large ramus intermediate vessel, and left circumflex vessel.  Normal dominant RCA.   Low right heart pressures.   In this patient with EF at 20 to 25%, findings are consistent with a nonischemic cardiomyopathy.  With his history of significant EtOH use, consider alcohol etiology.   RECOMMENATION: Guideline directed medical therapy for HFrEF as blood pressure allows.  Consider possible arrhythmic etiology to syncope versus hypotension.   Will start Entresto 24-'26mg'$  BID and spiro 12.'5mg'$  daily.  Continue Toprol XL '50mg'$  daily.   I have discussed case with EP and given his multiple syncopal spells and difficulty differentiating whether this is a primary arrhythmogenic cause in the setting of NICM or due to ETOH, recommend placing Life Vest.  Will reassess LVF in 3 months after treatment on maximum medical therapy.  If EF has not improved at that time and he has stopped alcohol use then would consider ICD but he must show [place] that he wants to stop drinking and get better. Will place LifeVest tomorrow and then can go home.

## 2021-01-20 NOTE — Progress Notes (Addendum)
Progress Note  Patient Name: Luis Porter Date of Encounter: 01/20/2021  [facility] Cardiologist: Fransico Him, MD   Subjective   Denies any chest pain.  SOB improved.  NPO for cath today  Inpatient Medications    Scheduled Meds:  aspirin  324 mg Oral Once   aspirin EC  81 mg Oral Daily   chlordiazePOXIDE  25 mg Oral BH-qamhs   Followed by   Derrill Memo ON 01/21/2021] chlordiazePOXIDE  25 mg Oral Daily   enoxaparin (LOVENOX) injection  40 mg Subcutaneous [id number]   folic acid  1 mg Oral Daily   hydrocortisone cream   Topical TID   metoprolol succinate  50 mg Oral Daily   multivitamin with minerals  1 tablet Oral Daily   pantoprazole  80 mg Oral Daily   sodium chloride flush  3 mL Intravenous Q12H   thiamine  100 mg Oral Daily   Or   thiamine  100 mg Intravenous Daily   Continuous Infusions:  sodium chloride     sodium chloride 10 mL/hr at 01/20/21 0644   PRN Meds: sodium chloride, acetaminophen **OR** acetaminophen, chlordiazePOXIDE, hydrOXYzine, loperamide, ondansetron **OR** ondansetron (ZOFRAN) IV, oxyCODONE, polyvinyl alcohol, sodium chloride flush, traZODone   Vital Signs    Vitals:   01/18/21 2031 01/19/21 0412 01/19/21 2052 01/20/21 0455  BP: (!) 149/84 (!) 140/98 (!) 126/91 (!) 128/92  Pulse: 99 (!) 101 90 (!) 105  Resp: '16 17 16   '$ Temp: 97.9 F (36.6 C) 98.1 F (36.7 C) (!) 97.5 F (36.4 C) 98 F (36.7 C)  TempSrc: Oral Oral Oral   SpO2: 98% 97% 97%   Weight:      Height:        Intake/Output Summary (Last 24 hours) at 01/20/2021 0944 Last data filed at 01/20/2021 0644 Gross per 24 hour  Intake 9.52 ml  Output --  Net 9.52 ml   Last 3 Weights 01/18/2021 01/16/2021 01/12/2021  Weight (lbs) 146 lb 13.2 oz 151 lb 14.4 oz 148 lb  Weight (kg) 66.6 kg 68.9 kg 67.132 kg      Telemetry    NSR - Personally Reviewed  ECG    No new EKG to review - Personally Reviewed  Physical Exam   GEN: No acute distress.   Neck: No JVD Cardiac: RRR, no  murmurs, rubs, or gallops.  Respiratory: Clear to auscultation bilaterally. GI: Soft, nontender, non-distended  MS: No edema; No deformity. Neuro:  Nonfocal  Psych: Normal affect   Labs    High Sensitivity Troponin:   Recent Labs  Lab 01/16/21 1202 01/16/21 1409 01/16/21 1741 01/16/21 1923  TROPONINIHS 29* 37* 30* 28*      Chemistry Recent Labs  Lab 01/18/21 0423 01/19/21 0447 01/20/21 0508  NA 135 138 135  K 3.1* 3.7 3.6  CL 102 103 99  CO2 '23 22 26  '$ GLUCOSE 102* 89 109*  BUN '8 12 17  '$ CREATININE 0.53* 0.65 0.75  CALCIUM 9.3 10.0 9.6  PROT 7.4 8.3* 8.0  ALBUMIN 3.9 4.3 4.1  AST 83* 93* 58*  ALT 63* 69* 59*  ALKPHOS 65 71 68  BILITOT 1.1 1.6* 1.3*  GFRNONAA >60 >60 >60  ANIONGAP '10 13 10     '$ Hematology Recent Labs  Lab 01/16/21 1202 01/17/21 0248 01/20/21 0508  WBC 4.8 4.4 9.7  RBC 4.77 4.82 4.97  HGB 15.6 15.9 16.5  HCT 43.8 45.2 48.2  MCV 91.8 93.8 97.0  MCH 32.7 33.0 33.2  MCHC 35.6  35.2 34.2  RDW 15.2 15.7* 14.8  PLT 179 180 201    BNPNo results for input(s): BNP, PROBNP in the last 168 hours.   DDimer No results for input(s): DDIMER in the last 168 hours.   Radiology    ECHOCARDIOGRAM COMPLETE  Result Date: 01/18/2021    ECHOCARDIOGRAM REPORT   Patient Name:   Luis Porter Date of Exam: 01/18/2021 Medical Rec #:  [id number]             Height:       66.0 in Accession #:    [id number]            Weight:       146.8 lb Date of Birth:  [birth year]             BSA:          1.754 m Patient Age:    60 years              BP:           148/102 mmHg Patient Gender: M                     HR:           127 bpm. Exam Location:  Inpatient Procedure: 2D Echo, Cardiac Doppler and Color Doppler  Results communicated to Dr Darrick Meigs at 16:19 on 01/18/21. Indications:     Syncope  History:         Patient has no prior history of Echocardiogram examinations.                  Risk Factors:Hypertension.  Sonographer:     Luisa Hart RDCS Referring Phys:  [place] Diagnosing Phys: Oswaldo Milian MD IMPRESSIONS  1. Left ventricular ejection fraction, by estimation, is 20 to 25%. The left ventricle has severely decreased function. The left ventricle demonstrates global hypokinesis. There is mild left ventricular hypertrophy. Left ventricular diastolic parameters  are consistent with Grade I diastolic dysfunction (impaired relaxation).  2. Right ventricular systolic function is normal. The right ventricular size is normal.  3. The mitral valve is normal in structure. No evidence of mitral valve regurgitation. No evidence of mitral stenosis.  4. The aortic valve was not well visualized. Aortic valve regurgitation is not visualized. No aortic stenosis is present. FINDINGS  Left Ventricle: Left ventricular ejection fraction, by estimation, is 20 to 25%. The left ventricle has severely decreased function. The left ventricle demonstrates global hypokinesis. The left ventricular internal cavity size was normal in size. There is mild left ventricular hypertrophy. Left ventricular diastolic parameters are consistent with Grade I diastolic dysfunction (impaired relaxation). Right Ventricle: The right ventricular size is normal. No increase in right ventricular wall thickness. Right ventricular systolic function is normal. Left Atrium: Left atrial size was normal in size. Right Atrium: Right atrial size was normal in size. Pericardium: Trivial pericardial effusion is present. Mitral Valve: The mitral valve is normal in structure. No evidence of mitral valve regurgitation. No evidence of mitral valve stenosis. Tricuspid Valve: The tricuspid valve is normal in structure. Tricuspid valve regurgitation is trivial. Aortic Valve: The aortic valve was not well visualized. Aortic valve regurgitation is not visualized. No aortic stenosis is present. Aortic valve mean gradient measures 2.0 mmHg. Aortic valve peak gradient measures 2.8 mmHg. Aortic valve area, by VTI measures 4.22  cm. Pulmonic Valve: The pulmonic valve was grossly normal. Pulmonic valve regurgitation is not visualized. Aorta:  The aortic root and ascending aorta are structurally normal, with no evidence of dilitation. IAS/Shunts: The interatrial septum was not well visualized.  LEFT VENTRICLE PLAX 2D LVIDd:         5.20 cm     Diastology LVIDs:         4.40 cm     LV e' medial:    5.55 cm/s LV PW:         1.00 cm     LV E/e' medial:  4.8 LV IVS:        1.00 cm     LV e' lateral:   8.58 cm/s LVOT diam:     2.60 cm     LV E/e' lateral: 3.1 LV SV:         45 LV SV Index:   25 LVOT Area:     5.31 cm  LV Volumes (MOD) LV vol d, MOD A2C: 76.7 ml LV vol d, MOD A4C: 59.7 ml LV vol s, MOD A2C: 59.0 ml LV vol s, MOD A4C: 56.5 ml LV SV MOD A2C:     17.7 ml LV SV MOD A4C:     59.7 ml LV SV MOD BP:      11.8 ml RIGHT VENTRICLE RV S prime:     11.70 cm/s TAPSE (M-mode): 1.7 cm LEFT ATRIUM             Index LA diam:        2.00 cm 1.14 cm/m LA Vol (A2C):   19.0 ml 10.83 ml/m LA Vol (A4C):   11.6 ml 6.61 ml/m LA Biplane Vol: 15.3 ml 8.72 ml/m  AORTIC VALVE                   PULMONIC VALVE AV Area (Vmax):    3.99 cm    PV Vmax:       0.73 m/s AV Area (Vmean):   3.96 cm    PV Vmean:      45.800 cm/s AV Area (VTI):     4.22 cm    PV VTI:        0.080 m AV Vmax:           83.60 cm/s  PV Peak grad:  2.1 mmHg AV Vmean:          56.500 cm/s PV Mean grad:  1.0 mmHg AV VTI:            0.106 m AV Peak Grad:      2.8 mmHg AV Mean Grad:      2.0 mmHg LVOT Vmax:         62.90 cm/s LVOT Vmean:        42.100 cm/s LVOT VTI:          0.084 m LVOT/AV VTI ratio: 0.79  AORTA Ao Root diam: 3.20 cm Ao Asc diam:  3.30 cm MITRAL VALVE               TRICUSPID VALVE MV Area (PHT): 8.25 cm    TR Peak grad:   16.5 mmHg MV Decel Time: 92 msec     TR Vmax:        203.00 cm/s MV E velocity: 26.80 cm/s MV A velocity: 64.50 cm/s  SHUNTS MV E/A ratio:  0.42        Systemic VTI:  0.08 m  Systemic Diam: 2.60 cm Oswaldo Milian MD  Electronically signed by Oswaldo Milian MD Signature Date/Time: 01/18/2021/4:20:18 PM    Final (Updated)     Cardiac Studies   2D echo 01/18/2021 IMPRESSIONS    1. Left ventricular ejection fraction, by estimation, is 20 to 25%. The  left ventricle has severely decreased function. The left ventricle  demonstrates global hypokinesis. There is mild left ventricular  hypertrophy. Left ventricular diastolic parameters   are consistent with Grade I diastolic dysfunction (impaired relaxation).   2. Right ventricular systolic function is normal. The right ventricular  size is normal.   3. The mitral valve is normal in structure. No evidence of mitral valve  regurgitation. No evidence of mitral stenosis.   4. The aortic valve was not well visualized. Aortic valve regurgitation  is not visualized. No aortic stenosis is present.   Patient Profile     60 y.o. male  with a hx of alcoholic fatty liver, heavy alcohol abuse, hypertension, gastritis and prior tobacco smoking who is being seen 01/19/2021 for the evaluation of CHF at the request of Dr. Darrick Meigs.  Assessment & Plan    Acute combined CHF - Echocardiogram showed LV function of 20 to 25%, global hypokinesis, and grade 1 diastolic dysfunction.  Mild LVH. 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  Also has progressive worsening exertional dyspnea and chest tightness so suspicious for underlying CAD as well -he is NPO for right and left heart cath today to define coronary anatomy and right heart pressures -Started Toprol-XL 50 mg daily  -Add Entresto 24-'26mg'$  BID and spiro '25mg'$  daily after cath today -Hold adding statin (LFTs improving)   Syncope with questionable LOC -this has happened multiple times -likely related to excessive ETOH intake but in setting of LV dysfunction also need to consider arrhythmia -will get 30 day event monitor  outpt   Hypokalemia -Resolved K+ 3.6 today    I have spent a total of 30 minutes with patient reviewing 2D echo , telemetry, EKGs, labs and examining patient as well as establishing an assessment and plan that was discussed with the patient.  > 50% of time was spent in direct patient care.     For questions or updates, please contact [place] Please consult [email or web address] for contact info under        Signed, Fransico Him, MD  01/20/2021, 9:44 AM

## 2021-01-21 ENCOUNTER — Encounter (HOSPITAL_COMMUNITY): Payer: Self-pay | Admitting: Cardiovascular Disease

## 2021-01-21 DIAGNOSIS — F1010 Alcohol abuse, uncomplicated: Secondary | ICD-10-CM | POA: Diagnosis not present

## 2021-01-21 DIAGNOSIS — R55 Syncope and collapse: Secondary | ICD-10-CM

## 2021-01-21 DIAGNOSIS — I420 Dilated cardiomyopathy: Secondary | ICD-10-CM | POA: Diagnosis not present

## 2021-01-21 DIAGNOSIS — R778 Other specified abnormalities of plasma proteins: Secondary | ICD-10-CM | POA: Diagnosis not present

## 2021-01-21 DIAGNOSIS — I10 Essential (primary) hypertension: Secondary | ICD-10-CM | POA: Diagnosis not present

## 2021-01-21 LAB — COMPREHENSIVE METABOLIC PANEL
ALT: 48 U/L — ABNORMAL HIGH (ref 0–44)
AST: 42 U/L — ABNORMAL HIGH (ref 15–41)
Albumin: 3.9 g/dL (ref 3.5–5.0)
Alkaline Phosphatase: 65 U/L (ref 38–126)
Anion gap: 8 (ref 5–15)
BUN: 14 mg/dL (ref 6–20)
CO2: 22 mmol/L (ref 22–32)
Calcium: 9.4 mg/dL (ref 8.9–10.3)
Chloride: 104 mmol/L (ref 98–111)
Creatinine, Ser: 0.59 mg/dL — ABNORMAL LOW (ref 0.61–1.24)
GFR, Estimated: >60 mL/min (ref >60)
Glucose, Bld: 125 mg/dL — ABNORMAL HIGH (ref 70–99)
Potassium: 3.4 mmol/L — ABNORMAL LOW (ref 3.5–5.1)
Sodium: 134 mmol/L — ABNORMAL LOW (ref 135–145)
Total Bilirubin: 1.1 mg/dL (ref 0.3–1.2)
Total Protein: 7.4 g/dL (ref 6.5–8.1)

## 2021-01-21 LAB — CBC WITH DIFFERENTIAL/PLATELET
Abs Immature Granulocytes: 0.06 10*3/uL (ref 0.00–0.07)
Basophils Absolute: 0.1 10*3/uL (ref 0.0–0.1)
Basophils Relative: 1 %
Eosinophils Absolute: 0.1 10*3/uL (ref 0.0–0.5)
Eosinophils Relative: 1 %
HCT: 44.4 % (ref 39.0–52.0)
Hemoglobin: 15.1 g/dL (ref 13.0–17.0)
Immature Granulocytes: 1 %
Lymphocytes Relative: 22 %
Lymphs Abs: 1.7 10*3/uL (ref 0.7–4.0)
MCH: 32.9 pg (ref 26.0–34.0)
MCHC: 34 g/dL (ref 30.0–36.0)
MCV: 96.7 fL (ref 80.0–100.0)
Monocytes Absolute: 1.1 10*3/uL — ABNORMAL HIGH (ref 0.1–1.0)
Monocytes Relative: 14 %
Neutro Abs: 4.7 10*3/uL (ref 1.7–7.7)
Neutrophils Relative %: 61 %
Platelets: 215 10*3/uL (ref 150–400)
RBC: 4.59 MIL/uL (ref 4.22–5.81)
RDW: 14.6 % (ref 11.5–15.5)
WBC: 7.7 10*3/uL (ref 4.0–10.5)
nRBC: 0 % (ref 0.0–0.2)

## 2021-01-21 LAB — MAGNESIUM: Magnesium: 1.9 mg/dL (ref 1.7–2.4)

## 2021-01-21 LAB — PHOSPHORUS: Phosphorus: 4 mg/dL (ref 2.5–4.6)

## 2021-01-21 LAB — TSH: TSH: 2.046 u[IU]/mL (ref 0.350–4.500)

## 2021-01-21 MED ORDER — POTASSIUM CHLORIDE CRYS ER 20 MEQ PO TBCR
40.0000 meq | EXTENDED_RELEASE_TABLET | Freq: Once | ORAL | Status: AC
  Administered 2021-01-21: 40 meq via ORAL
  Filled 2021-01-21: qty 2

## 2021-01-21 MED ORDER — HYDROXYZINE HCL 25 MG PO TABS: 25.0000 mg | ORAL_TABLET | Freq: Four times a day (QID) | ORAL | 0 refills | Status: DC | PRN

## 2021-01-21 MED ORDER — SPIRONOLACTONE 25 MG PO TABS: 12.5000 mg | ORAL_TABLET | Freq: Every day | ORAL | 0 refills | Status: DC

## 2021-01-21 MED ORDER — ONDANSETRON HCL 4 MG PO TABS: 4.0000 mg | ORAL_TABLET | Freq: Four times a day (QID) | ORAL | 0 refills | Status: DC | PRN

## 2021-01-21 MED ORDER — SACUBITRIL-VALSARTAN 24-26 MG PO TABS: 1.0000 | ORAL_TABLET | Freq: Two times a day (BID) | ORAL | 0 refills | Status: DC

## 2021-01-21 MED ORDER — METOPROLOL SUCCINATE ER 50 MG PO TB24: 50.0000 mg | ORAL_TABLET | Freq: Every day | ORAL | 0 refills | Status: DC

## 2021-01-21 MED ORDER — LIVING BETTER WITH HEART FAILURE BOOK: Freq: Once | Status: DC

## 2021-01-21 NOTE — Discharge Instructions (Signed)
ADDITIONAL INSTRUCTIONS - No driving until cleared by your cardiology team - you are typically restricted for driving for 6 months from last event of passing out. - Post-Cath Instructions: No lifting over 5 lbs for 1 week. No sexual activity for 1 week. Keep procedure site clean & dry. If you notice increased pain, swelling, bleeding or pus, call/return!  You may shower, but no soaking baths/hot tubs/pools for 1 week.  One of your heart tests showed weakness of the heart muscle this admission. This may make you more susceptible to weight gain from fluid retention, which can lead to symptoms that we call heart failure. Please follow these special instructions:   For patients with congestive heart failure, we give them these special instructions:  1. Follow a low-salt diet - you should be eating no more than 2,'000mg'$  of sodium per day. This does not necessarily just apply to the salt you put on top of prepared food, but the sodium already in food. Processed food, frozen meals, canned goods, deli meat, and bread can have a surprising amount of sodium per serving so be sure to track this daily. 2. Watch your fluid intake. In general, you should not be taking in more than 2 liters of fluid per day (close to 64 oz of fluid per day). This includes sources of water in foods like soup, coffee, tea, milk, etc. It's important to stay hydrated but NOT to excess. 2. Weigh yourself on the same scale at same time of day and keep a log. 3. Call your doctor: (Anytime you feel any of the following symptoms)  - 3lb weight gain overnight or 5lb within a few days - Shortness of breath, with or without a dry hacking cough  - Swelling in the hands, feet or stomach  - If you have to sleep on extra pillows at night in order to breathe  IT IS IMPORTANT TO LET YOUR DOCTOR KNOW EARLY ON IF YOU ARE HAVING SYMPTOMS SO WE CAN HELP YOU!

## 2021-01-21 NOTE — Discharge Summary (Signed)
Physician Discharge Summary  Luis Porter [id number] DOB: 1960-09-27 DOA: 01/16/2021  PCP: Olin Hauser, DO  Admit date: 01/16/2021 Discharge date: 01/21/2021  Admitted From: Home Disposition: Home  Recommendations for Outpatient Follow-up:  Follow up with PCP in 1-2 weeks Follow-up with cardiology within 1 to 2 weeks Follow-up with psychiatry in outpatient setting Please obtain CMP/CBC, mag, Phos in one week Please follow up on the following pending results:  Home Health: No Equipment/Devices: None  Discharge Condition: Stable CODE STATUS: Full code Diet recommendation: Heart healthy diet  Brief/Interim Summary: Patient is a 60 year old Caucasian male with a past medical history significant for but not limited to alcohol use disorder, fatty liver disease, chronic gastritis, BPH, hypertension as well as other comorbidities who presented with 2 episodes of syncope.  In the ED lab work showed mild elevation in AST and ALT and alcohol level 358.  EKG showed sinus rhythm with borderline inferior T wave abnormalities but no significant changes.  Further work-up was done and I will was showing an EF of 25%.  Cardiology was consulted and recommended cardiac catheterization which was done today.  He was IVC'd the day before yesterday due to worsening hallucinations and trying to leave the hospital but now will rescind IVC given that he is out of withdrawals and improved.  Cardiology cleared the patient for discharge however prior to discharge they recommended placing about LifeVest.  LifeVest was placed late this evening and he has been deemed medically stable to be discharged at this time after his LifeVest is in place.  His symptoms have resolved and he is no longer short of breath.  Cardiology will be following with him closely and they recommended that he stay out of work for 3 months and that he cannot drive for at least 6 months.    Discharge Diagnoses:  Active  Problems:   Essential hypertension   BPH with obstruction/lower urinary tract symptoms   Alcohol abuse, daily use   Alcohol withdrawal syndrome without complication (HCC)   Alcohol induced fatty liver   Chronic alcoholic gastritis without hemorrhage   Elevated troponin   Syncope   Cardiomyopathy ([place])  Acute combined systolic and diastolic CHF Dilated cardiomyopathy -Echocardiogram done showed an LV function of 20 to 25% and global hypokinesis and grade 1 diastolic CHF as well as mild LVH -Cardiology believes that this is likely alcohol induced cardiomyopathy but  he has cardiac risk factors of hypertension, tobacco abuse and heavy alcohol abuse -Patient presented with progressive worsening exertional dyspnea and chest tightness so this was suspicious for underlying CAD -Cardiology had recommended a left and right coronary cardiac catheterization and this was done and this was normal -They started Toprol 50 mg p.o. 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 recommendation was to place a LifeVest as below and cardiology will reassess left ventricular ejection fraction in 3 months after treatment on maximal medical therapy: Life vest was placed late this evening and so he was discharged after   Syncope with questionable loss of consciousness -Likely vasovagal or related to alcohol; telemetry showed normal sinus rhythm -EKG showed borderline T wave abnormalities in leads II, III and aVF -Troponin was elevated at 37, 30, 28 -Echocardiogram obtained shows EF 25% with global hypokinesis -Cardiology has been consulted and he underwent  a left and right heart catheterization which was normal -Cardiology initially recommended a 30-day event monitor as an  outpatient but now recommending keeping the patient and placing of LifeVest and this was done this evening   Alcohol Withdrawal Syndrome -Patient went into alcohol withdrawal delirium on 01/18/2021 -He had been tremulous -Confused, agitated, having hallucinations -He was initially started on started on Ativan per CIWA protocol -CIWA score got as high as 26 -Continue thiamine, folic acid -Ativan was not effective so it was discontinued and patient started on Librium detox protocol and he is improving on this and to get 25 mg p.o. every morning nightly for 2 doses -Patient was trying to leave hospital 8/22, he is risk to self as he has no medical decision-making capacity due to delirium due to alcohol withdrawal.  So IVC paperwork was initiated and patient was involuntarily committed in the hospital.  IVC will be rescinded given that he is much improved and sitter will be discontinued   Hypokalemia -patient's potassium was 3.4 today but will replete prior to discharge -Magnesium level is now 1.9 -Continue monitor and replete as necessary -Repeat CMP in a.m.   Abnormal LFTs  Hyperbilirubinemia -Patient's AST and ALT were elevated and now trending down and last AST was 42 and ALT was 48 - T bili and trended up to 1.6 now trended back down to 1.1   Hypertension -Amlodipine has been discontinued on his been started on Toprol-XL and Entresto 24-26 1 tab p.o. twice daily -Cardiology also recommending starting spironolactone 12.5 mg p.o. daily -Continue monitor blood pressures per protocol -Last blood pressure reading was 110/86 prior to discharge   Chronic gastritis/GERD -Continue PPI with pantoprazole 80 mg p.o. daily   Discharge Instructions  Discharge Instructions     Call MD for:  difficulty breathing, headache or visual disturbances   Complete by: As directed    Call MD for:  extreme fatigue   Complete by: As directed    Call MD for:  hives   Complete by: As directed    Call MD  for:  persistant dizziness or light-headedness   Complete by: As directed    Call MD for:  persistant nausea and vomiting   Complete by: As directed    Call MD for:  redness, tenderness, or signs of infection (pain, swelling, redness, odor or green/yellow discharge around incision site)   Complete by: As directed    Call MD for:  severe uncontrolled pain   Complete by: As directed    Call MD for:  temperature >100.4   Complete by: As directed    Diet - low sodium heart healthy   Complete by: As directed    Discharge instructions   Complete by: As directed    You were cared for by a hospitalist during your hospital stay. If you have any questions about your discharge medications or the care you received while you were in the hospital after you are discharged, you can call the unit and ask to speak with the hospitalist on call if the hospitalist that took care of you is not available. Once you are discharged, your primary care physician will handle any further medical issues. Please note that NO REFILLS for any discharge medications will be authorized once you are discharged, as it is imperative that you return to your primary care physician (or establish a relationship with a primary care physician if you do not have one) for your aftercare needs so that they can reassess your  need for medications and monitor your lab values.  Follow up with PCP and Cardiology within 1-2 weeks. Take all medications as prescribed. If symptoms change or worsen please return to the ED for evaluation   Driving Restrictions   Complete by: As directed    No Driving for 6 months per Cardiology Recc's   Increase activity slowly   Complete by: As directed       Allergies as of 01/21/2021       Reactions   Benazepril    Angioedema   Naltrexone Other (See Comments)   angry   Nsaids Other (See Comments)   Due to history of ulcers        Medication List     STOP taking these medications    amLODipine 10  MG tablet Commonly known as: NORVASC   dicyclomine 10 MG capsule Commonly known as: BENTYL   naltrexone 50 MG tablet Commonly known as: DEPADE   ondansetron 4 MG disintegrating tablet Commonly known as: ZOFRAN-ODT   sildenafil 20 MG tablet Commonly known as: REVATIO       TAKE these medications    albuterol 108 (90 Base) MCG/ACT inhaler Commonly known as: VENTOLIN HFA INHALE 1-2 PUFFS INTO THE LUNGS EVERY 4 (FOUR) HOURS AS NEEDED FOR WHEEZING OR SHORTNESS OF BREATH.   fluticasone 50 MCG/ACT nasal spray Commonly known as: FLONASE PLACE 2 SPRAYS INTO BOTH NOSTRILS DAILY. USE FOR 4-6 WEEKS THEN STOP AND USE SEASONALLY OR AS NEEDED. What changed:  when to take this reasons to take this additional instructions   folic acid 1 MG tablet Commonly known as: FOLVITE Take 1 tablet (1 mg total) by mouth daily.   hydrOXYzine 25 MG tablet Commonly known as: ATARAX/VISTARIL Take 1 tablet (25 mg total) by mouth every 6 (six) hours as needed for anxiety (or CIWA score </= 10).   meclizine 25 MG tablet Commonly known as: ANTIVERT Take 1 tablet (25 mg total) by mouth 3 (three) times daily as needed for dizziness or nausea.   metoprolol succinate 50 MG 24 hr tablet Commonly known as: TOPROL-XL Take 1 tablet (50 mg total) by mouth daily. Take with or immediately following a meal. Start taking on: January 22, 2021   omeprazole 40 MG capsule Commonly known as: PRILOSEC TAKE 1 CAPSULE BY MOUTH TWICE A DAY   ondansetron 4 MG tablet Commonly known as: ZOFRAN Take 1 tablet (4 mg total) by mouth every 6 (six) hours as needed for nausea.   sacubitril-valsartan 24-26 MG Commonly known as: ENTRESTO Take 1 tablet by mouth 2 (two) times daily.   spironolactone 25 MG tablet Commonly known as: ALDACTONE Take 0.5 tablets (12.5 mg total) by mouth daily. Start taking on: January 22, 2021   thiamine 100 MG tablet Take 1 tablet (100 mg total) by mouth daily.   Vitamins/Minerals Tabs Take  1 tablet by mouth daily.               Durable Medical Equipment  (From admission, onward)           Start     Ordered   01/21/21 1227  For home use only DME Vest life vest  Once       Comments: For 3 months Dx Syncope, nonischemic cardiomyopathy   01/21/21 1227   01/20/21 1718  For home use only DME Vest life vest  Once        01/20/21 1717            Follow-up  Information     [facility] Follow up.   Specialty: Cardiology Why: A followup has been arranged for you at the [facility] at [facility] on Tuesday Jan 26, 2021 at 9:00 AM. Parking is available by [address] or off [address] in the parking garage with garage code [phone]. Contact information: [address] [id number] [place]        Olin Hauser, DO. Call.   Specialty: Family Medicine Why: Follow up within 1-2 weeks Contact information: [place] [address] [phone]         Sueanne Margarita, MD .   Specialty: Cardiology Contact information: [id number] [address] [place] [address] [phone]                Allergies  Allergen Reactions   Benazepril     Angioedema   Naltrexone Other (See Comments)    angry   Nsaids Other (See Comments)    Due to history of ulcers    Consultations: Cardiology  Procedures/Studies: CARDIAC CATHETERIZATION  Result Date: 01/20/2021 Normal large epicardial coronary arteries with a left main trifurcating into an LAD, large ramus intermediate vessel, and left circumflex vessel.  Normal dominant RCA. Low right heart pressures. In this patient with EF at 20 to 25%, findings are consistent with a nonischemic cardiomyopathy.  With his history of significant EtOH use, consider alcohol etiology. RECOMMENATION: Guideline directed medical therapy for HFrEF as  blood pressure allows.  Consider possible arrhythmic etiology to syncope versus hypotension.  He has a ready for Mannam sinus node   DG Chest Portable 1 View  Result Date: 01/16/2021 CLINICAL DATA:  Per EMS, patient from home, c/o 25 pound weight loss in 1 month with decreased PO intake. Fatigue x 3 days. Reports he consumes 5 drinks daily. One shot and one beer today. Hx of liver disease and anemia. EXAM: PORTABLE CHEST 1 VIEW COMPARISON:  02/17/2020 FINDINGS: Normal cardiac silhouette.  No mediastinal or hilar masses. Clear lungs.  No convincing pleural effusion or pneumothorax. Skeletal structures are grossly intact. IMPRESSION: No active disease. Electronically Signed   By: Lajean Manes M.D.   On: 01/16/2021 15:30   ECHOCARDIOGRAM COMPLETE  Result Date: 01/18/2021    ECHOCARDIOGRAM REPORT   Patient Name:   Luis Porter Date of Exam: 01/18/2021 Medical Rec #:  [id number]             Height:       66.0 in Accession #:    [id number]            Weight:       146.8 lb Date of Birth:  10-26-1960             BSA:          1.754 m Patient Age:    26 years              BP:           148/102 mmHg Patient Gender: M                     HR:           127 bpm. Exam Location:  Inpatient Procedure: 2D Echo, Cardiac Doppler and Color Doppler  Results communicated to Dr Darrick Meigs at 16:19 on 01/18/21. Indications:  Syncope  History:         Patient has no prior history of Echocardiogram examinations.                  Risk Factors:Hypertension.  Sonographer:     Luisa Hart RDCS Referring Phys:  [place] Diagnosing Phys: Oswaldo Milian MD IMPRESSIONS  1. Left ventricular ejection fraction, by estimation, is 20 to 25%. The left ventricle has severely decreased function. The left ventricle demonstrates global hypokinesis. There is mild left ventricular hypertrophy. Left ventricular diastolic parameters  are consistent with Grade I diastolic dysfunction (impaired relaxation).  2. Right ventricular  systolic function is normal. The right ventricular size is normal.  3. The mitral valve is normal in structure. No evidence of mitral valve regurgitation. No evidence of mitral stenosis.  4. The aortic valve was not well visualized. Aortic valve regurgitation is not visualized. No aortic stenosis is present. FINDINGS  Left Ventricle: Left ventricular ejection fraction, by estimation, is 20 to 25%. The left ventricle has severely decreased function. The left ventricle demonstrates global hypokinesis. The left ventricular internal cavity size was normal in size. There is mild left ventricular hypertrophy. Left ventricular diastolic parameters are consistent with Grade I diastolic dysfunction (impaired relaxation). Right Ventricle: The right ventricular size is normal. No increase in right ventricular wall thickness. Right ventricular systolic function is normal. Left Atrium: Left atrial size was normal in size. Right Atrium: Right atrial size was normal in size. Pericardium: Trivial pericardial effusion is present. Mitral Valve: The mitral valve is normal in structure. No evidence of mitral valve regurgitation. No evidence of mitral valve stenosis. Tricuspid Valve: The tricuspid valve is normal in structure. Tricuspid valve regurgitation is trivial. Aortic Valve: The aortic valve was not well visualized. Aortic valve regurgitation is not visualized. No aortic stenosis is present. Aortic valve mean gradient measures 2.0 mmHg. Aortic valve peak gradient measures 2.8 mmHg. Aortic valve area, by VTI measures 4.22 cm. Pulmonic Valve: The pulmonic valve was grossly normal. Pulmonic valve regurgitation is not visualized. Aorta: The aortic root and ascending aorta are structurally normal, with no evidence of dilitation. IAS/Shunts: The interatrial septum was not well visualized.  LEFT VENTRICLE PLAX 2D LVIDd:         5.20 cm     Diastology LVIDs:         4.40 cm     LV e' medial:    5.55 cm/s LV PW:         1.00 cm     LV  E/e' medial:  4.8 LV IVS:        1.00 cm     LV e' lateral:   8.58 cm/s LVOT diam:     2.60 cm     LV E/e' lateral: 3.1 LV SV:         45 LV SV Index:   25 LVOT Area:     5.31 cm  LV Volumes (MOD) LV vol d, MOD A2C: 76.7 ml LV vol d, MOD A4C: 59.7 ml LV vol s, MOD A2C: 59.0 ml LV vol s, MOD A4C: 56.5 ml LV SV MOD A2C:     17.7 ml LV SV MOD A4C:     59.7 ml LV SV MOD BP:      11.8 ml RIGHT VENTRICLE RV S prime:     11.70 cm/s TAPSE (M-mode): 1.7 cm LEFT ATRIUM             Index LA diam:  2.00 cm 1.14 cm/m LA Vol (A2C):   19.0 ml 10.83 ml/m LA Vol (A4C):   11.6 ml 6.61 ml/m LA Biplane Vol: 15.3 ml 8.72 ml/m  AORTIC VALVE                   PULMONIC VALVE AV Area (Vmax):    3.99 cm    PV Vmax:       0.73 m/s AV Area (Vmean):   3.96 cm    PV Vmean:      45.800 cm/s AV Area (VTI):     4.22 cm    PV VTI:        0.080 m AV Vmax:           83.60 cm/s  PV Peak grad:  2.1 mmHg AV Vmean:          56.500 cm/s PV Mean grad:  1.0 mmHg AV VTI:            0.106 m AV Peak Grad:      2.8 mmHg AV Mean Grad:      2.0 mmHg LVOT Vmax:         62.90 cm/s LVOT Vmean:        42.100 cm/s LVOT VTI:          0.084 m LVOT/AV VTI ratio: 0.79  AORTA Ao Root diam: 3.20 cm Ao Asc diam:  3.30 cm MITRAL VALVE               TRICUSPID VALVE MV Area (PHT): 8.25 cm    TR Peak grad:   16.5 mmHg MV Decel Time: 92 msec     TR Vmax:        203.00 cm/s MV E velocity: 26.80 cm/s MV A velocity: 64.50 cm/s  SHUNTS MV E/A ratio:  0.42        Systemic VTI:  0.08 m                            Systemic Diam: 2.60 cm Oswaldo Milian MD Electronically signed by Oswaldo Milian MD Signature Date/Time: 01/18/2021/4:20:18 PM    Final (Updated)     ECHOCARDIOGRAM IMPRESSIONS     1. Left ventricular ejection fraction, by estimation, is 20 to 25%. The  left ventricle has severely decreased function. The left ventricle  demonstrates global hypokinesis. There is mild left ventricular  hypertrophy. Left ventricular diastolic parameters   are  consistent with Grade I diastolic dysfunction (impaired relaxation).   2. Right ventricular systolic function is normal. The right ventricular  size is normal.   3. The mitral valve is normal in structure. No evidence of mitral valve  regurgitation. No evidence of mitral stenosis.   4. The aortic valve was not well visualized. Aortic valve regurgitation  is not visualized. No aortic stenosis is present.   FINDINGS   Left Ventricle: Left ventricular ejection fraction, by estimation, is 20  to 25%. The left ventricle has severely decreased function. The left  ventricle demonstrates global hypokinesis. The left ventricular internal  cavity size was normal in size. There  is mild left ventricular hypertrophy. Left ventricular diastolic  parameters are consistent with Grade I diastolic dysfunction (impaired  relaxation).   Right Ventricle: The right ventricular size is normal. No increase in  right ventricular wall thickness. Right ventricular systolic function is  normal.   Left Atrium: Left atrial size was normal in size.   Right Atrium: Right atrial  size was normal in size.   Pericardium: Trivial pericardial effusion is present.   Mitral Valve: The mitral valve is normal in structure. No evidence of  mitral valve regurgitation. No evidence of mitral valve stenosis.   Tricuspid Valve: The tricuspid valve is normal in structure. Tricuspid  valve regurgitation is trivial.   Aortic Valve: The aortic valve was not well visualized. Aortic valve  regurgitation is not visualized. No aortic stenosis is present. Aortic  valve mean gradient measures 2.0 mmHg. Aortic valve peak gradient measures  2.8 mmHg. Aortic valve area, by VTI  measures 4.22 cm.   Pulmonic Valve: The pulmonic valve was grossly normal. Pulmonic valve  regurgitation is not visualized.   Aorta: The aortic root and ascending aorta are structurally normal, with  no evidence of dilitation.   IAS/Shunts: The interatrial  septum was not well visualized.      LEFT VENTRICLE  PLAX 2D  LVIDd:         5.20 cm     Diastology  LVIDs:         4.40 cm     LV e' medial:    5.55 cm/s  LV PW:         1.00 cm     LV E/e' medial:  4.8  LV IVS:        1.00 cm     LV e' lateral:   8.58 cm/s  LVOT diam:     2.60 cm     LV E/e' lateral: 3.1  LV SV:         45  LV SV Index:   25  LVOT Area:     5.31 cm     LV Volumes (MOD)  LV vol d, MOD A2C: 76.7 ml  LV vol d, MOD A4C: 59.7 ml  LV vol s, MOD A2C: 59.0 ml  LV vol s, MOD A4C: 56.5 ml  LV SV MOD A2C:     17.7 ml  LV SV MOD A4C:     59.7 ml  LV SV MOD BP:      11.8 ml   RIGHT VENTRICLE  RV S prime:     11.70 cm/s  TAPSE (M-mode): 1.7 cm   LEFT ATRIUM             Index  LA diam:        2.00 cm 1.14 cm/m  LA Vol (A2C):   19.0 ml 10.83 ml/m  LA Vol (A4C):   11.6 ml 6.61 ml/m  LA Biplane Vol: 15.3 ml 8.72 ml/m   AORTIC VALVE                   PULMONIC VALVE  AV Area (Vmax):    3.99 cm    PV Vmax:       0.73 m/s  AV Area (Vmean):   3.96 cm    PV Vmean:      45.800 cm/s  AV Area (VTI):     4.22 cm    PV VTI:        0.080 m  AV Vmax:           83.60 cm/s  PV Peak grad:  2.1 mmHg  AV Vmean:          56.500 cm/s PV Mean grad:  1.0 mmHg  AV VTI:            0.106 m  AV Peak Grad:      2.8 mmHg  AV Mean Grad:      2.0 mmHg  LVOT Vmax:         62.90 cm/s  LVOT Vmean:        42.100 cm/s  LVOT VTI:          0.084 m  LVOT/AV VTI ratio: 0.79     AORTA  Ao Root diam: 3.20 cm  Ao Asc diam:  3.30 cm   MITRAL VALVE               TRICUSPID VALVE  MV Area (PHT): 8.25 cm    TR Peak grad:   16.5 mmHg  MV Decel Time: 92 msec     TR Vmax:        203.00 cm/s  MV E velocity: 26.80 cm/s  MV A velocity: 64.50 cm/s  SHUNTS  MV E/A ratio:  0.42        Systemic VTI:  0.08 m                             Systemic Diam: 2.60 cm   CARDIAC CATH Normal large epicardial coronary arteries with a left main trifurcating into an LAD, large ramus intermediate vessel, and left  circumflex vessel.  Normal dominant RCA.   Low right heart pressures.   In this patient with EF at 20 to 25%, findings are consistent with a nonischemic cardiomyopathy.  With his history of significant EtOH use, consider alcohol etiology.   RECOMMENATION: Guideline directed medical therapy for HFrEF as blood pressure allows.  Consider possible arrhythmic etiology to syncope versus hypotension.  He has a ready for Mannam sinus node  Subjective: Seen and examined at bedside and was doing relatively well.  Had some back pain upon ambulation which resolved fairly quickly.  Denied any lightheadedness or dizziness.  No chest pain or shortness of breath.  Feels well and ready for discharge.  He appears to be wanting to drink alcohol so he is going to apply for the resources provided by the Education officer, museum.  Discharge Exam: Vitals:   01/21/21 0533 01/21/21 1307  BP: (!) 128/92 110/86  Pulse: 91 87  Resp:  18  Temp: 97.7 F (36.5 C) 97.7 F (36.5 C)  SpO2: 98% 98%   Vitals:   01/20/21 1642 01/20/21 2259 01/21/21 0533 01/21/21 1307  BP: 134/82 135/90 (!) 128/92 110/86  Pulse: 91 79 91 87  Resp: 16   18  Temp:  97.7 F (36.5 C) 97.7 F (36.5 C) 97.7 F (36.5 C)  TempSrc:  Oral Oral Oral  SpO2: 98% 97% 98% 98%  Weight:      Height:       General: Pt is alert, awake, not in acute distress Cardiovascular: RRR, S1/S2 +, no rubs, no gallops Respiratory: Diminished bilaterally, no wheezing, no rhonchi; unlabored breathing Abdominal: Soft, NT, ND, bowel sounds + Extremities: no edema, no cyanosis  The results of significant diagnostics from this hospitalization (including imaging, microbiology, ancillary and laboratory) are listed below for reference.    Microbiology: Recent Results (from the past 240 hour(s))  Resp Panel by RT-PCR (Flu A&B, Covid) Nasopharyngeal Swab     Status: None   Collection Time: 01/16/21  5:41 PM   Specimen: Nasopharyngeal Swab; Nasopharyngeal(NP) swabs in vial  transport medium  Result Value Ref Range Status   SARS Coronavirus 2 by RT PCR NEGATIVE NEGATIVE Final    Comment: (NOTE) SARS-CoV-2 target nucleic acids are NOT DETECTED.  The SARS-CoV-2 RNA is generally detectable in upper respiratory specimens during the acute phase of infection. The lowest concentration of SARS-CoV-2 viral copies this assay can detect is 138 copies/mL. A negative result does not preclude SARS-Cov-2 infection and should not be used as the sole basis for treatment or other patient management decisions. A negative result may occur with  improper specimen collection/handling, submission of specimen other than nasopharyngeal swab, presence of viral mutation(s) within the areas targeted by this assay, and inadequate number of viral copies(<138 copies/mL). A negative result must be combined with clinical observations, patient history, and epidemiological information. The expected result is Negative.  Fact Sheet for Patients:  EntrepreneurPulse.com.au  Fact Sheet for Healthcare Providers:  IncredibleEmployment.be  This test is no t yet approved or cleared by the [place] FDA and  has been authorized for detection and/or diagnosis of SARS-CoV-2 by FDA under an Emergency Use Authorization (EUA). This EUA will remain  in effect (meaning this test can be used) for the duration of the COVID-19 declaration under Section 564(b)(1) of the Act, 21 U.S.C.section 360bbb-3(b)(1), unless the authorization is terminated  or revoked sooner.       Influenza A by PCR NEGATIVE NEGATIVE Final   Influenza B by PCR NEGATIVE NEGATIVE Final    Comment: (NOTE) The Xpert Xpress SARS-CoV-2/FLU/RSV plus assay is intended as an aid in the diagnosis of influenza from Nasopharyngeal swab specimens and should not be used as a sole basis for treatment. Nasal washings and aspirates are unacceptable for Xpert Xpress SARS-CoV-2/FLU/RSV testing.  Fact  Sheet for Patients: EntrepreneurPulse.com.au  Fact Sheet for Healthcare Providers: IncredibleEmployment.be  This test is not yet approved or cleared by the [place] FDA and has been authorized for detection and/or diagnosis of SARS-CoV-2 by FDA under an Emergency Use Authorization (EUA). This EUA will remain in effect (meaning this test can be used) for the duration of the COVID-19 declaration under Section 564(b)(1) of the Act, 21 U.S.C. section 360bbb-3(b)(1), unless the authorization is terminated or revoked.  Performed at [facility], [place] [address], [place], [place] [address]     Labs: BNP (last 3 results) No results for input(s): BNP in the last 8760 hours. Basic Metabolic Panel: Recent Labs  Lab 01/16/21 1202 01/16/21 1235 01/17/21 0248 01/18/21 0423 01/19/21 0447 01/20/21 0508 01/20/21 1232 01/20/21 1247 01/21/21 0859  NA  --    < > 138 135 138 135 136  136 135 134*  K  --    < > 3.1* 3.1* 3.7 3.6 3.7  3.6 3.6 3.4*  CL  --    < > 100 102 103 99  --   --  104  CO2  --    < > '27 23 22 26  '$ --   --  22  GLUCOSE  --    < > 108* 102* 89 109*  --   --  125*  BUN  --    < > '8 8 12 17  '$ --   --  14  CREATININE  --    < > 0.54* 0.53* 0.65 0.75  --   --  0.59*  CALCIUM  --    < > 9.3 9.3 10.0 9.6  --   --  9.4  MG 2.0  --  1.8  --   --   --   --   --  1.9  PHOS  --   --  3.1  --   --   --   --   --  4.0   < > = values in this interval not displayed.   Liver Function Tests: Recent Labs  Lab 01/17/21 0248 01/18/21 0423 01/19/21 0447 01/20/21 0508 01/21/21 0859  AST 130* 83* 93* 58* 42*  ALT 79* 63* 69* 59* 48*  ALKPHOS 68 65 71 68 65  BILITOT 1.3* 1.1 1.6* 1.3* 1.1  PROT 7.7 7.4 8.3* 8.0 7.4  ALBUMIN 4.2 3.9 4.3 4.1 3.9   No results for input(s): LIPASE, AMYLASE in the last 168 hours. No results for input(s): AMMONIA in the last 168 hours. CBC: Recent Labs  Lab 01/16/21 1202 01/17/21 0248  01/20/21 0508 01/20/21 1232 01/20/21 1247 01/21/21 0859  WBC 4.8 4.4 9.7  --   --  7.7  NEUTROABS 2.2  --   --   --   --  4.7  HGB 15.6 15.9 16.5 15.0  15.0 14.6 15.1  HCT 43.8 45.2 48.2 44.0  44.0 43.0 44.4  MCV 91.8 93.8 97.0  --   --  96.7  PLT 179 180 201  --   --  215   Cardiac Enzymes: No results for input(s): CKTOTAL, CKMB, CKMBINDEX, TROPONINI in the last 168 hours. BNP: Invalid input(s): POCBNP CBG: Recent Labs  Lab 01/16/21 1202  GLUCAP 100*   D-Dimer No results for input(s): DDIMER in the last 72 hours. Hgb A1c No results for input(s): HGBA1C in the last 72 hours. Lipid Profile No results for input(s): CHOL, HDL, LDLCALC, TRIG, CHOLHDL, LDLDIRECT in the last 72 hours. Thyroid function studies Recent Labs    01/21/21 0859  TSH 2.046   Anemia work up No results for input(s): VITAMINB12, FOLATE, FERRITIN, TIBC, IRON, RETICCTPCT in the last 72 hours. Urinalysis    Component Value Date/Time   COLORURINE YELLOW (A) 07/23/2020 1304   APPEARANCEUR CLEAR (A) 07/23/2020 1304   LABSPEC 1.004 (L) 07/23/2020 1304   PHURINE 5.0 07/23/2020 1304   GLUCOSEU NEGATIVE 07/23/2020 1304   HGBUR MODERATE (A) 07/23/2020 1304   BILIRUBINUR NEGATIVE 07/23/2020 1304   KETONESUR 5 (A) 07/23/2020 1304   PROTEINUR 30 (A) 07/23/2020 1304   NITRITE NEGATIVE 07/23/2020 1304   LEUKOCYTESUR NEGATIVE 07/23/2020 1304   Sepsis Labs Invalid input(s): PROCALCITONIN,  WBC,  LACTICIDVEN Microbiology Recent Results (from the past 240 hour(s))  Resp Panel by RT-PCR (Flu A&B, Covid) Nasopharyngeal Swab     Status: None   Collection Time: 01/16/21  5:41 PM   Specimen: Nasopharyngeal Swab; Nasopharyngeal(NP) swabs in vial transport medium  Result Value Ref Range Status   SARS Coronavirus 2 by RT PCR NEGATIVE NEGATIVE Final    Comment: (NOTE) SARS-CoV-2 target nucleic acids are NOT DETECTED.  The SARS-CoV-2 RNA is generally detectable in upper respiratory specimens during the acute phase  of infection. The lowest concentration of SARS-CoV-2 viral copies this assay can detect is 138 copies/mL. A negative result does not preclude SARS-Cov-2 infection and should not be used as the sole basis for treatment or other patient management decisions. A negative result may occur with  improper specimen collection/handling, submission of specimen other than nasopharyngeal swab, presence of viral mutation(s) within the areas targeted by this assay, and inadequate number of viral copies(<138 copies/mL). A negative result must be combined with clinical observations, patient history, and epidemiological information. The expected result is Negative.  Fact Sheet for Patients:  EntrepreneurPulse.com.au  Fact Sheet for Healthcare Providers:  IncredibleEmployment.be  This test is no t yet approved or cleared by the [place] and  has been authorized for  detection and/or diagnosis of SARS-CoV-2 by FDA under an Emergency Use Authorization (EUA). This EUA will remain  in effect (meaning this test can be used) for the duration of the COVID-19 declaration under Section 564(b)(1) of the Act, 21 U.S.C.section 360bbb-3(b)(1), unless the authorization is terminated  or revoked sooner.       Influenza A by PCR NEGATIVE NEGATIVE Final   Influenza B by PCR NEGATIVE NEGATIVE Final    Comment: (NOTE) The Xpert Xpress SARS-CoV-2/FLU/RSV plus assay is intended as an aid in the diagnosis of influenza from Nasopharyngeal swab specimens and should not be used as a sole basis for treatment. Nasal washings and aspirates are unacceptable for Xpert Xpress SARS-CoV-2/FLU/RSV testing.  Fact Sheet for Patients: EntrepreneurPulse.com.au  Fact Sheet for Healthcare Providers: IncredibleEmployment.be  This test is not yet approved or cleared by the [place] FDA and has been authorized for detection and/or diagnosis of  SARS-CoV-2 by FDA under an Emergency Use Authorization (EUA). This EUA will remain in effect (meaning this test can be used) for the duration of the COVID-19 declaration under Section 564(b)(1) of the Act, 21 U.S.C. section 360bbb-3(b)(1), unless the authorization is terminated or revoked.  Performed at [facility], [place] [address] [place], [place] [address]    Time coordinating discharge: 35 minutes  SIGNED:  Kerney Elbe, DO [facility] 01/21/2021, 7:41 PM Pager is on [place]  If 7PM-7AM, please contact night-coverage [email or web address]

## 2021-01-21 NOTE — TOC Progression Note (Signed)
Transition of Care [facility]) - Progression Note    Patient Details  Name: Luis Porter MRN: [id number] Date of Birth: [birth year]  [facility] CM/SW Contact  Ross Ludwig, [place] Phone Number: 01/21/2021, 12:19 PM  Clinical Narrative:    Per physician, patient's IVC has been rescinded.  CSW faxed requested notice of commitment change for patient to the magistrate's office. Patient will need a life vest for discharge, CSW was contacted by [place], they will be able to deliver and fit patient for Life Vest today.  Once Life Vest is delivered and set up for patient, he can discharge if medically ready.  Expected Discharge Plan: IP Rehab Facility Barriers to Discharge: Active Substance Use - Placement  Expected Discharge Plan and Services Expected Discharge Plan: [place]   Discharge Planning Services: CM Consult   Living arrangements for the past 2 months: Apartment                                       Social Determinants of Health (SDOH) Interventions Food Insecurity Interventions: Intervention Not Indicated Financial Strain Interventions: Intervention Not Indicated Housing Interventions: Intervention Not Indicated Intimate Partner Violence Interventions: Intervention Not Indicated Stress Interventions: Intervention Not Indicated Social Connections Interventions: Intervention Not Indicated Transportation Interventions: Intervention Not Indicated  Readmission Risk Interventions No flowsheet data found.

## 2021-01-21 NOTE — Progress Notes (Addendum)
Progress Note  Patient Name: Luis Porter Date of Encounter: 01/21/2021  Primary Cardiologist: Fransico Him, MD  Subjective   He went for a walk and felt some upper back discomfort as well as dyspnea that improved sitting down. Edema remains resolved. Very engaged in care, asking excellent questions. Discussed plan for Lifevest. He also relays that he is involved in workup with PCP for unintentional weight loss, 160s->140s.  Inpatient Medications    Scheduled Meds:  aspirin  324 mg Oral Once   aspirin EC  81 mg Oral Daily   chlordiazePOXIDE  25 mg Oral BH-qamhs   Followed by   chlordiazePOXIDE  25 mg Oral Daily   enoxaparin (LOVENOX) injection  40 mg Subcutaneous [id number]   folic acid  1 mg Oral Daily   hydrocortisone cream   Topical TID   metoprolol succinate  50 mg Oral Daily   multivitamin with minerals  1 tablet Oral Daily   pantoprazole  80 mg Oral Daily   sacubitril-valsartan  1 tablet Oral BID   sodium chloride flush  3 mL Intravenous Q12H   spironolactone  12.5 mg Oral Daily   thiamine  100 mg Oral Daily   Or   thiamine  100 mg Intravenous Daily   Continuous Infusions:  PRN Meds: acetaminophen **OR** acetaminophen, chlordiazePOXIDE, hydrOXYzine, loperamide, ondansetron **OR** ondansetron (ZOFRAN) IV, oxyCODONE, polyvinyl alcohol, traZODone   Vital Signs    Vitals:   01/20/21 1310 01/20/21 1642 01/20/21 2259 01/21/21 0533  BP: (!) 152/75 134/82 135/90 (!) 128/92  Pulse: 82 91 79 91  Resp: 19 16    Temp:   97.7 F (36.5 C) 97.7 F (36.5 C)  TempSrc:   Oral Oral  SpO2: 99% 98% 97% 98%  Weight:      Height:        Intake/Output Summary (Last 24 hours) at 01/21/2021 0838 Last data filed at 01/20/2021 2144 Gross per 24 hour  Intake 243 ml  Output --  Net 243 ml   Last 3 Weights 01/18/2021 01/16/2021 01/12/2021  Weight (lbs) 146 lb 13.2 oz 151 lb 14.4 oz 148 lb  Weight (kg) 66.6 kg 68.9 kg 67.132 kg     Telemetry    NSR - Personally  Reviewed  Physical Exam   GEN: No acute distress.  HEENT: Normocephalic, atraumatic, sclera non-icteric. Neck: No JVD or bruits. Cardiac: RRR no murmurs, rubs, or gallops.  Respiratory: Clear to auscultation bilaterally. Breathing is unlabored. GI: Soft, nontender, non-distended, BS +x 4. MS: no deformity. Extremities: No clubbing or cyanosis. No edema. Distal pedal pulses are 2+ and equal bilaterally. Right radial cath site without hematoma or ecchymosis; good pulse. Neuro:  AAOx3. Follows commands. Psych:  Responds to questions appropriately with a normal affect.  Labs    High Sensitivity Troponin:   Recent Labs  Lab 01/16/21 1202 01/16/21 1409 01/16/21 1741 01/16/21 1923  TROPONINIHS 29* 37* 30* 28*      Cardiac EnzymesNo results for input(s): TROPONINI in the last 168 hours. No results for input(s): TROPIPOC in the last 168 hours.   Chemistry Recent Labs  Lab 01/18/21 0423 01/19/21 0447 01/20/21 0508 01/20/21 1232 01/20/21 1247  NA 135 138 135 136  136 135  K 3.1* 3.7 3.6 3.7  3.6 3.6  CL 102 103 99  --   --   CO2 '23 22 26  '$ --   --   GLUCOSE 102* 89 109*  --   --   BUN '8 12 17  '$ --   --  CREATININE 0.53* 0.65 0.75  --   --   CALCIUM 9.3 10.0 9.6  --   --   PROT 7.4 8.3* 8.0  --   --   ALBUMIN 3.9 4.3 4.1  --   --   AST 83* 93* 58*  --   --   ALT 63* 69* 59*  --   --   ALKPHOS 65 71 68  --   --   BILITOT 1.1 1.6* 1.3*  --   --   GFRNONAA >60 >60 >60  --   --   ANIONGAP '10 13 10  '$ --   --      Hematology Recent Labs  Lab 01/16/21 1202 01/17/21 0248 01/20/21 0508 01/20/21 1232 01/20/21 1247  WBC 4.8 4.4 9.7  --   --   RBC 4.77 4.82 4.97  --   --   HGB 15.6 15.9 16.5 15.0  15.0 14.6  HCT 43.8 45.2 48.2 44.0  44.0 43.0  MCV 91.8 93.8 97.0  --   --   MCH 32.7 33.0 33.2  --   --   MCHC 35.6 35.2 34.2  --   --   RDW 15.2 15.7* 14.8  --   --   PLT 179 180 201  --   --     BNPNo results for input(s): BNP, PROBNP in the last 168 hours.   DDimer  No results for input(s): DDIMER in the last 168 hours.   Radiology    CARDIAC CATHETERIZATION  Result Date: 01/20/2021 Normal large epicardial coronary arteries with a left main trifurcating into an LAD, large ramus intermediate vessel, and left circumflex vessel.  Normal dominant RCA. Low right heart pressures. In this patient with EF at 20 to 25%, findings are consistent with a nonischemic cardiomyopathy.  With his history of significant EtOH use, consider alcohol etiology. RECOMMENATION: Guideline directed medical therapy for HFrEF as blood pressure allows.  Consider possible arrhythmic etiology to syncope versus hypotension.  He has a ready for Mannam sinus node    Cardiac Studies   2D echo 01/18/2021 IMPRESSIONS    1. Left ventricular ejection fraction, by estimation, is 20 to 25%. The  left ventricle has severely decreased function. The left ventricle  demonstrates global hypokinesis. There is mild left ventricular  hypertrophy. Left ventricular diastolic parameters   are consistent with Grade I diastolic dysfunction (impaired relaxation).   2. Right ventricular systolic function is normal. The right ventricular  size is normal.   3. The mitral valve is normal in structure. No evidence of mitral valve  regurgitation. No evidence of mitral stenosis.   4. The aortic valve was not well visualized. Aortic valve regurgitation  is not visualized. No aortic stenosis is present.   Cath findings as above 01/20/21  Patient Profile     60 y.o. male alcoholic fatty liver, heavy alcohol abuse, hypertension, gastritis and prior heavy tobacco use admitted with new onset combined CHF, syncope with questionable LOC.  Assessment & Plan    1. Acute combined CHF/NICM - 2D echo EF 20-25%, grade 1 DD, mild LVH - cath with minimal nonobstructive disease - will discuss with MD whether we can D/c aspirin (ETOH use/risk of GIB/prior gastritis) - will also discuss issue of exertional back discomfort,  improved with rest - continue Toprol '50mg'$  daily, Entresto 24/'26mg'$  BID, spironolactone 12.'5mg'$  daily  - per Dr. Radford Pax, "Will reassess LVF in 3 months after treatment on maximum medical therapy.  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 Syncope with questionable LOC - see H/P for details, unclear if this is true LOC or versus due to ETOH intoxication - per Dr. Theodosia Blender note, "I have discussed case with EP and given his multiple syncopal spells and difficulty differentiating whether this is a primary arrhythmogenic cause in the setting of NICM or due to ETOH, recommend placing Life Vest" - msg sent to rep to ensure this is underway, do not discharge patient until this has been fitted - discussed no driving x 6 months per DMV recommendation  3. Hypokalemia - AM labs are pending at this time, ordered by primary team (Last K 3.6, last Mg 1.8 -> but also started on spironolactone yesterday)  4. ETOH abuse - recommend social work resources prior to DC per primary team  5. Unintentional weight loss - patient has plans to continue to follow with PCP - add TSH to labs  I have arranged follow-up in our [facility] CHF impact clinic in 5 days - in discussion with Trish, per their most recent discussions, patient will likely be followed by their team given his dx and they will arrange f/u with Dr. Haroldine Laws if appropriate or defer back to general cardiology (Trish will follow up with Dr. Haroldine Laws to confirm). Clinic info placed on AVS.  For questions or updates, please contact [facility] Please consult [email or web address] for contact info under  Cardiology/STEMI.  Signed, Charlie Pitter, PA-C 01/21/2021, 8:38 AM

## 2021-01-21 NOTE — Progress Notes (Signed)
Mobility Specialist - Progress Note    01/21/21 1218  Mobility  Activity Ambulated in hall  Level of Assistance Independent  Distance Ambulated (ft) 600 ft  Mobility Ambulated independently in hallway  Mobility Response Tolerated well  Mobility performed by Mobility specialist  $Mobility charge 1 Mobility    Pt ambulated 600 ft in hallway with no assistive device. No reports of SOB, pain, or dizziness present. Pt returned to recliner after session with call bell at side.  [place] Specialist Acute Rehabilitation Services Phone: [phone] 01/21/21, 12:20 PM

## 2021-01-22 NOTE — Progress Notes (Signed)
[facility]itions [facility]  PCP: Nobie Putnam Primary Cardiologist: Fransico Him  HPI:  Luis Porter is a 60 y.o.  male  with a PMH significant for severe alcohol use disorder, alcoholic fatty liver disease, HTN, Tobacco use disorder  Patient admitted for syncope evaluation to the medicine service.  Still drinking heavily at least 12 pack of beer per day also some liquor on top of this.  EKG without acute ischemia, troponin flat 29 > 28.  Echo 01/18/2021 EF 20 to 25% global hypokinesis, G1 DD, normal RV function, valves okay.  Given depressed EF he was taken for right and left heart catheterization which demonstrated normal coronary arteries.  RHC results  [facility] RA: 1 RV: 12/1 PA: 14/3; mean 7 PW: A-wave 3; mean 1 AO:107/73 LV: 105/3  Fick method, cardiac output 3.1 L/min with a cardiac index of 1.7 L/min/m.  GDMT titrated during admission discharged on Toprol-XL 50 daily, Entresto 24/26 twice daily, Spiro 12.5 mg daily.  Given his presentation of syncope and severely depressed LVEF he was placed on a LifeVest before discharge.  Feels better strength wise, not having shakiness.  Has quit alcohol completely.  Still having some shortness of breath and dizziness on standing.  Has more energy than he has had in years.  Working in the yard, around the house.  Taking walks.  Yesterday did a bit too much removing shrubs and small trees and became short of breath and had to go inside.     ROS: All systems negative except as listed in HPI, PMH and Problem List.  SH:  Social History   Socioeconomic History   Marital status: Divorced    Spouse name: Not on file   Number of children: Not on file   Years of education: Not on file   Highest education level: Not on file  Occupational History   Not on file  Tobacco Use   Smoking status: Former    Packs/day: 2.00    Years: 25.00    Pack years: 50.00    Types: Cigarettes    Quit date:  05/31/1987    Years since quitting: 33.6   Smokeless tobacco: Never  Vaping Use   Vaping Use: Never used  Substance and Sexual Activity   Alcohol use: Not Currently    Alcohol/week: 14.0 standard drinks    Types: 14 Cans of beer per week    Comment: former usage. States he drinks around 2 beer a day   Drug use: No   Sexual activity: Yes    Comment: parners birth control  Other Topics Concern   Not on file  Social History Narrative   Not on file   Social Determinants of Health   Financial Resource Strain: Low Risk    Difficulty of Paying Living Expenses: Not very hard  Food Insecurity: No Food Insecurity   Worried About Charity fundraiser in the Last Year: Never true   Ran Out of Food in the Last Year: Never true  Transportation Needs: No Transportation Needs   Lack of Transportation (Medical): No   Lack of Transportation (Non-Medical): No  Physical Activity: Not on file  Stress: No Stress Concern Present   Feeling of Stress : Only a little  Social Connections: Unknown   Frequency of Communication with Friends and Family: Three times a week   Frequency of Social Gatherings with Friends and Family: Three times a week   Attends Religious Services: 1 to 4 times per  year   Active Member of Clubs or Organizations: No   Attends Music therapist: Never   Marital Status: Not on file  Intimate Partner Violence: Not At Risk   Fear of Current or Ex-Partner: No   Emotionally Abused: No   Physically Abused: No   Sexually Abused: No    FH:  Family History  Problem Relation Age of Onset   Hypertension Mother    Cancer Mother        Ovarian   Cervical cancer Mother    Hypertension Father    Cancer Father        tongue   Throat cancer Father    Hypertension Brother    Prostate cancer Neg Hx    Breast cancer Neg Hx    Colon cancer Neg Hx    Heart attack Neg Hx    Stroke Neg Hx     Past Medical History:  Diagnosis Date   Diverticulosis    Gastric ulcer     GERD (gastroesophageal reflux disease)    History of hiatal hernia    Hypertension    Iron deficiency anemia 10/18/2019   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 6.7 each 1   fluticasone (FLONASE) 50 MCG/ACT nasal spray PLACE 2 SPRAYS INTO BOTH NOSTRILS DAILY. USE FOR 4-6 WEEKS THEN STOP AND USE SEASONALLY OR AS NEEDED. (Patient taking differently: Place 2 sprays into both nostrils daily as needed for allergies.) 48 mL 1   folic acid (FOLVITE) 1 MG tablet Take 1 tablet (1 mg total) by mouth daily. 30 tablet 2   hydrOXYzine (ATARAX/VISTARIL) 25 MG tablet Take 1 tablet (25 mg total) by mouth every 6 (six) hours as needed for anxiety (or CIWA score </= 10). 30 tablet 0   meclizine (ANTIVERT) 25 MG tablet Take 1 tablet (25 mg total) by mouth 3 (three) times daily as needed for dizziness or nausea. 60 tablet 1   metoprolol succinate (TOPROL-XL) 50 MG 24 hr tablet Take 1 tablet (50 mg total) by mouth daily. Take with or immediately following a meal. 30 tablet 0   omeprazole (PRILOSEC) 40 MG capsule TAKE 1 CAPSULE BY MOUTH TWICE A DAY (Patient taking differently: Take 40 mg by mouth 2 (two) times daily.) 60 capsule 1   ondansetron (ZOFRAN) 4 MG tablet Take 1 tablet (4 mg total) by mouth every 6 (six) hours as needed for nausea. 20 tablet 0   thiamine 100 MG tablet Take 1 tablet (100 mg total) by mouth daily. 30 tablet 2   Vitamins/Minerals TABS Take 1 tablet by mouth daily.     sacubitril-valsartan (ENTRESTO) 24-26 MG Take 1 tablet by mouth 2 (two) times daily. (Patient not taking: Reported on 01/26/2021) 60 tablet 0   spironolactone (ALDACTONE) 25 MG tablet Take 1 tablet (25 mg total) by mouth daily. 30 tablet 1   No current  facility-administered medications for this encounter.    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   -R/LHC 12/2020 normal coronary arteries.  RHC with low filling pressures, Fick method, cardiac output 3.1 L/min with a cardiac index of 1.7 L/min/m. -NYHA Class II, euvolemic on exam -Continue entresto 24/26, Toprol XL 50 daily -Increase spiro to '25mg'$  daily -bmp in one week  Alcohol Use Disorder -no alcohol since discharge  Recent Syncope -concern for cardiogenic syncope -discharged with lifevest -no shocks or VF episodes reviewed zoll report -Denies further syncopal episodes -continue life vest   Follow up w/[facility] cardiology

## 2021-01-25 ENCOUNTER — Telehealth (HOSPITAL_COMMUNITY): Payer: Self-pay

## 2021-01-25 NOTE — Telephone Encounter (Signed)
Called to confirm Heart & Vascular Transitions of Care appointment at 0900 8/31. Patient reminded to bring all medications and pill box organizer with them. Confirmed patient has transportation. Gave directions, instructed to utilize [place] parking.  Pt states his pharmacy wouldn't fill his entresto d/t cost. Pt has not taken Entresto since DC from hospital.   Pt has concerns regarding voice being raspy x 3 days.   Confirmed appointment prior to ending call.   Pricilla Holm, MSN, RN Heart Failure Nurse Navigator [phone]

## 2021-01-26 ENCOUNTER — Ambulatory Visit (HOSPITAL_COMMUNITY)
Admit: 2021-01-26 | Discharge: 2021-01-26 | Disposition: A | Discharge status: Home / Self Care | Payer: Commercial Managed Care - PPO | Source: Ambulatory Visit | Attending: Internal Medicine | Admitting: Internal Medicine

## 2021-01-26 ENCOUNTER — Other Ambulatory Visit: Payer: Self-pay | Admitting: Gastroenterology

## 2021-01-26 ENCOUNTER — Encounter: Payer: Self-pay | Admitting: Oncology

## 2021-01-26 ENCOUNTER — Encounter (HOSPITAL_COMMUNITY): Payer: Self-pay

## 2021-01-26 ENCOUNTER — Other Ambulatory Visit (HOSPITAL_COMMUNITY): Payer: Self-pay

## 2021-01-26 ENCOUNTER — Other Ambulatory Visit: Payer: Self-pay

## 2021-01-26 VITALS — BP 144/96 | HR 76 | Ht 66.0 in | Wt 153.0 lb

## 2021-01-26 DIAGNOSIS — Z8249 Family history of ischemic heart disease and other diseases of the circulatory system: Secondary | ICD-10-CM | POA: Insufficient documentation

## 2021-01-26 DIAGNOSIS — I110 Hypertensive heart disease with heart failure: Secondary | ICD-10-CM | POA: Insufficient documentation

## 2021-01-26 DIAGNOSIS — R42 Dizziness and giddiness: Secondary | ICD-10-CM | POA: Diagnosis not present

## 2021-01-26 DIAGNOSIS — I5022 Chronic systolic (congestive) heart failure: Secondary | ICD-10-CM

## 2021-01-26 DIAGNOSIS — F1029 Alcohol dependence with unspecified alcohol-induced disorder: Secondary | ICD-10-CM

## 2021-01-26 DIAGNOSIS — Z7901 Long term (current) use of anticoagulants: Secondary | ICD-10-CM | POA: Diagnosis not present

## 2021-01-26 DIAGNOSIS — Z79899 Other long term (current) drug therapy: Secondary | ICD-10-CM | POA: Insufficient documentation

## 2021-01-26 DIAGNOSIS — R55 Syncope and collapse: Secondary | ICD-10-CM

## 2021-01-26 DIAGNOSIS — F1010 Alcohol abuse, uncomplicated: Secondary | ICD-10-CM

## 2021-01-26 DIAGNOSIS — I420 Dilated cardiomyopathy: Secondary | ICD-10-CM | POA: Diagnosis not present

## 2021-01-26 DIAGNOSIS — R0602 Shortness of breath: Secondary | ICD-10-CM | POA: Diagnosis present

## 2021-01-26 DIAGNOSIS — Z87891 Personal history of nicotine dependence: Secondary | ICD-10-CM | POA: Diagnosis not present

## 2021-01-26 DIAGNOSIS — K700 Alcoholic fatty liver: Secondary | ICD-10-CM

## 2021-01-26 DIAGNOSIS — Z0279 Encounter for issue of other medical certificate: Secondary | ICD-10-CM

## 2021-01-26 MED ORDER — SPIRONOLACTONE 25 MG PO TABS: 25.0000 mg | ORAL_TABLET | Freq: Every day | ORAL | 1 refills | Status: DC

## 2021-01-26 NOTE — Patient Instructions (Signed)
RESTART Entresto 24/26 mg, one tab twice a day INCREASE Spironolactone 25 mg, one tab daily  Labs needed in one week  Your physician recommends that you schedule a follow-up appointment in: 1 month with Dr Radford Pax -the office should contact you with an appointment     At the [facility], you and your health needs are our priority. As part of our continuing mission to provide you with exceptional heart care, we have created designated Provider Care Teams. These Care Teams include your primary Cardiologist (physician) and Advanced Practice Providers (APPs- Physician Assistants and Nurse Practitioners) who all work together to provide you with the care you need, when you need it.   You may see any of the following providers on your designated Care Team at your next follow up: Dr Glori Bickers Dr Loralie Champagne Dr Patrice Paradise, NP Lyda Jester, [place] Ginnie Smart Audry Riles, PharmD   Please be sure to bring in all your medications bottles to every appointment.

## 2021-01-27 ENCOUNTER — Telehealth (INDEPENDENT_AMBULATORY_CARE_PROVIDER_SITE_OTHER): Payer: Commercial Managed Care - PPO | Admitting: Family Medicine

## 2021-01-27 ENCOUNTER — Encounter: Payer: Self-pay | Admitting: Family Medicine

## 2021-01-27 ENCOUNTER — Other Ambulatory Visit (HOSPITAL_COMMUNITY): Payer: Self-pay

## 2021-01-27 ENCOUNTER — Telehealth: Payer: Self-pay | Admitting: Cardiology

## 2021-01-27 VITALS — Ht 66.0 in | Wt 153.0 lb

## 2021-01-27 DIAGNOSIS — K700 Alcoholic fatty liver: Secondary | ICD-10-CM | POA: Diagnosis not present

## 2021-01-27 DIAGNOSIS — I5043 Acute on chronic combined systolic (congestive) and diastolic (congestive) heart failure: Secondary | ICD-10-CM | POA: Diagnosis not present

## 2021-01-27 DIAGNOSIS — I420 Dilated cardiomyopathy: Secondary | ICD-10-CM | POA: Diagnosis not present

## 2021-01-27 DIAGNOSIS — F1021 Alcohol dependence, in remission: Secondary | ICD-10-CM

## 2021-01-27 MED ORDER — FOLIC ACID 1 MG PO TABS: 1.0000 mg | ORAL_TABLET | Freq: Every day | ORAL | 3 refills | Status: AC

## 2021-01-27 MED ORDER — THIAMINE HCL 100 MG PO TABS: 100.0000 mg | ORAL_TABLET | Freq: Every day | ORAL | 3 refills | Status: DC

## 2021-01-27 NOTE — Progress Notes (Signed)
Virtual Visit via Telephone The purpose of this virtual visit is to provide medical care while limiting exposure to the novel coronavirus (COVID19) for both patient and office staff.  Consent was obtained for phone visit:  Yes.   Answered questions that patient had about telehealth interaction:  Yes.   I discussed the limitations, risks, security and privacy concerns of performing an evaluation and management service by telephone. I also discussed with the patient that there may be a patient responsible charge related to this service. The patient expressed understanding and agreed to proceed.  Patient Location: Home Provider Location: [facility] (Office)  Participants in virtual visit: - Patient: Luis Porter - CMA: Orinda Kenner, CMA - Provider: Dr Parks Ranger  ---------------------------------------------------------------------- Chief Complaint  Patient presents with   Hospitalization Follow-up   Diverticulitis    S: Reviewed CMA documentation. 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  Last seen by me 12/22/20 for updated visit on status of his GI symptoms and return to [facility].  He is followed by Dr Marius Ditch GI specialist.  Recent hospitalization 01/16/21 through 01/21/21 diagnosed with new medical problems with Dilated Cardiomyopathy and Acute on Chronic combined CHF. He had ECHO Cardiogram with LVEF reduced to 20-25% global hypokinesis, and grade 1 diastolic CHF, thought to be  alcohol induced.  He was started on medications for med management after Cardiac Cath completed and normal. Started on Toprol, Entresto, Spironolactone.  He was placed on LifeVest for Defib due to reduced LVEF and repeat in 3 months.  Syncopal episode likely vaso vagal from alcohol/ hydration.  Alcohol withdrawal syndrome with delirium tremens DTs, placed on detox CIWA protocol, started on Librium and discharged alcohol free.  He received IV fluids, vitamin treatments B1, Folic acid due to alcohol withdrawal    Taking Omeprazole '40mg'$  BID   He is unable to work due to heart failure, dyspnea, weakness, diarrhea, required to have frequent bathroom trips and difficulty with prolonged standing, walking, limited balance.   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 States he drinks around 2 beer a day   Drug use: No    Current Outpatient Medications:    albuterol (VENTOLIN HFA) 108 (90 Base) MCG/ACT inhaler, INHALE 1-2 PUFFS INTO THE LUNGS EVERY 4 (FOUR) HOURS AS NEEDED FOR  WHEEZING OR SHORTNESS OF BREATH., Disp: 6.7 each, Rfl: 1   fluticasone (FLONASE) 50 MCG/ACT nasal spray, PLACE 2 SPRAYS INTO BOTH NOSTRILS DAILY. USE FOR 4-6 WEEKS THEN STOP AND USE SEASONALLY OR AS NEEDED. (Patient taking differently: Place 2 sprays into both nostrils daily as needed for allergies.), Disp: 48 mL, Rfl: 1   hydrOXYzine (ATARAX/VISTARIL) 25 MG tablet, Take 1 tablet (25 mg total) by mouth every 6 (six) hours as needed for anxiety (or CIWA score </= 10)., Disp: 30 tablet, Rfl: 0   meclizine (ANTIVERT) 25 MG tablet, Take 1 tablet (25 mg total) by mouth 3 (three) times daily as needed for dizziness or nausea., Disp: 60 tablet, Rfl: 1   metoprolol succinate (TOPROL-XL) 50 MG 24 hr tablet, Take 1 tablet (50 mg total) by mouth daily. Take with or immediately following a meal., Disp: 30 tablet, Rfl: 0   omeprazole (PRILOSEC) 40 MG capsule, TAKE 1 CAPSULE BY MOUTH TWICE A DAY (Patient taking differently: Take 40 mg by mouth 2 (two) times daily.), Disp: 60 capsule, Rfl: 1   ondansetron (ZOFRAN) 4 MG tablet, Take 1 tablet (4 mg total) by mouth every 6 (six) hours as needed for nausea., Disp: 20 tablet, Rfl: 0   spironolactone (ALDACTONE) 25 MG tablet, Take 1 tablet (25 mg total) by mouth daily., Disp: 30 tablet, Rfl: 1   Vitamins/Minerals TABS, Take 1 tablet by mouth daily., Disp: , Rfl:    folic acid (FOLVITE) 1 MG tablet, Take 1 tablet (1 mg total) by mouth daily., Disp: 90 tablet, Rfl: 3   sacubitril-valsartan (ENTRESTO) 24-26 MG, Take 1 tablet by mouth 2 (two) times daily. (Patient not taking: No sig reported), Disp: 60 tablet, Rfl: 0   thiamine 100 MG tablet, Take 1 tablet (100 mg total) by mouth daily., Disp: 90 tablet, Rfl: 3  Depression screen [facility] 2/9 09/30/2019 11/20/2018 11/08/2016  Decreased Interest 0 0 0  Down, Depressed, Hopeless 0 0 0  PHQ - 2 Score 0 0 0  Altered sleeping - - 0  Tired, decreased energy - - 1  Change in appetite - - 0  Feeling bad or failure about yourself  - - 0   Trouble concentrating - - 0  Moving slowly or fidgety/restless - - 0  Suicidal thoughts - - 0  PHQ-9 Score - - 1    No flowsheet data found.  -------------------------------------------------------------------------- O: No physical exam performed due to remote telephone encounter.  Lab results reviewed.  Recent Results (from the past 2160 hour(s))  CBC     Status: None   Collection Time: 01/12/21 10:34 AM  Result Value Ref Range   WBC 5.4 3.4 - 10.8 x10E3/uL   RBC 5.03 4.14 - 5.80 x10E6/uL   Hemoglobin 16.1 13.0 - 17.7 g/dL   Hematocrit 47.1 37.5 - 51.0 %   MCV 94 79 - 97 fL   MCH 32.0 26.6 - 33.0 pg   MCHC 34.2 31.5 - 35.7 g/dL   RDW 15.4 11.6 - 15.4 %   Platelets 165 150 - 450 x10E3/uL  Hepatic function panel     Status: Abnormal   Collection Time: 01/12/21 10:34 AM  Result Value Ref Range   Total Protein 7.4 6.0 - 8.5  g/dL   Albumin 4.6 3.8 - 4.9 g/dL   Bilirubin Total 0.5 0.0 - 1.2 mg/dL   Bilirubin, Direct 0.14 0.00 - 0.40 mg/dL   Alkaline Phosphatase 96 44 - 121 IU/L   AST 125 (H) 0 - 40 IU/L   ALT 68 (H) 0 - 44 IU/L  CBC with Differential     Status: Abnormal   Collection Time: 01/16/21 12:02 PM  Result Value Ref Range   WBC 4.8 4.0 - 10.5 K/uL   RBC 4.77 4.22 - 5.81 MIL/uL   Hemoglobin 15.6 13.0 - 17.0 g/dL   HCT 43.8 39.0 - 52.0 %   MCV 91.8 80.0 - 100.0 fL   MCH 32.7 26.0 - 34.0 pg   MCHC 35.6 30.0 - 36.0 g/dL   RDW 15.2 11.5 - 15.5 %   Platelets 179 150 - 400 K/uL   nRBC 0.0 0.0 - 0.2 %   Neutrophils Relative % 45 %   Neutro Abs 2.2 1.7 - 7.7 K/uL   Lymphocytes Relative 35 %   Lymphs Abs 1.7 0.7 - 4.0 K/uL   Monocytes Relative 14 %   Monocytes Absolute 0.7 0.1 - 1.0 K/uL   Eosinophils Relative 2 %   Eosinophils Absolute 0.1 0.0 - 0.5 K/uL   Basophils Relative 4 %   Basophils Absolute 0.2 (H) 0.0 - 0.1 K/uL   Immature Granulocytes 0 %   Abs Immature Granulocytes 0.01 0.00 - 0.07 K/uL    Comment: Performed at [facility], [place] [address], [place], [place] [address]  Ethanol     Status: Abnormal   Collection Time: 01/16/21 12:02 PM  Result Value Ref Range   Alcohol, Ethyl (B) 358 (HH) <10 mg/dL    Comment: CRITICAL RESULT CALLED TO, READ BACK BY AND VERIFIED WITH: SPENCER L, RN @ [id number] ON 01/16/2021 BY BROOKS, L (NOTE) Lowest detectable limit for serum alcohol is 10 mg/dL.  For medical purposes only. Performed at [facility], [place] [address], [place], [place] [address]   Troponin I (High Sensitivity)     Status: Abnormal   Collection Time: 01/16/21 12:02 PM  Result Value Ref Range   Troponin I (High Sensitivity) 29 (H) <18 ng/L    Comment: (NOTE) Elevated high sensitivity troponin I (hsTnI) values and significant  changes across serial measurements may suggest ACS but many other  chronic and acute conditions are known to elevate hsTnI results.  Refer to the "Links" section for chest pain algorithms and additional  guidance. Performed at [facility], [place] [address], [place], [place] [address]   POC CBG, ED     Status: Abnormal   Collection Time: 01/16/21 12:02 PM  Result Value Ref Range   Glucose-Capillary 100 (H) 70 - 99 mg/dL    Comment: Glucose reference range applies only to samples taken after fasting for at least 8 hours.  Magnesium     Status: None   Collection Time: 01/16/21 12:02 PM  Result Value Ref Range   Magnesium 2.0 1.7 - 2.4 mg/dL    Comment: Performed at [facility], [place] [address] [place], [place] [address]  Comprehensive metabolic panel     Status: Abnormal   Collection Time: 01/16/21 12:35 PM  Result Value Ref Range   Sodium 141 135 - 145 mmol/L   Potassium 3.4 (L) 3.5 - 5.1 mmol/L   Chloride 106 98 - 111 mmol/L   CO2 22 22 - 32 mmol/L   Glucose, Bld  85 70 - 99 mg/dL    Comment: Glucose reference range applies only to samples taken after fasting for at least 8 hours.   BUN 6 6 - 20 mg/dL   Creatinine, Ser 0.59 (L)  0.61 - 1.24 mg/dL   Calcium 7.6 (L) 8.9 - 10.3 mg/dL   Total Protein 6.6 6.5 - 8.1 g/dL   Albumin 3.5 3.5 - 5.0 g/dL   AST 123 (H) 15 - 41 U/L   ALT 71 (H) 0 - 44 U/L   Alkaline Phosphatase 56 38 - 126 U/L   Total Bilirubin 0.8 0.3 - 1.2 mg/dL   GFR, Estimated >60 >60 mL/min    Comment: (NOTE) Calculated using the CKD-EPI Creatinine Equation (2021)    Anion gap 13 5 - 15    Comment: Performed at [facility], [place] [address], [place], [place] [address]  Troponin I (High Sensitivity)     Status: Abnormal   Collection Time: 01/16/21  2:09 PM  Result Value Ref Range   Troponin I (High Sensitivity) 37 (H) <18 ng/L    Comment: (NOTE) Elevated high sensitivity troponin I (hsTnI) values and significant  changes across serial measurements may suggest ACS but many other  chronic and acute conditions are known to elevate hsTnI results.  Refer to the "Links" section for chest pain algorithms and additional  guidance. Performed at [facility], [place] [address], [place], [place] [address]   Resp Panel by RT-PCR (Flu A&B, Covid) Nasopharyngeal Swab     Status: None   Collection Time: 01/16/21  5:41 PM   Specimen: Nasopharyngeal Swab; Nasopharyngeal(NP) swabs in vial transport medium  Result Value Ref Range   SARS Coronavirus 2 by RT PCR NEGATIVE NEGATIVE    Comment: (NOTE) SARS-CoV-2 target nucleic acids are NOT DETECTED.  The SARS-CoV-2 RNA is generally detectable in upper respiratory specimens during the acute phase of infection. The lowest concentration of SARS-CoV-2 viral copies this assay can detect is 138 copies/mL. A negative result does not preclude SARS-Cov-2 infection and should not be used as the sole basis for treatment or other patient management decisions. A negative result may occur with  improper specimen collection/handling, submission of specimen other than nasopharyngeal swab, presence of viral mutation(s) within the areas targeted  by this assay, and inadequate number of viral copies(<138 copies/mL). A negative result must be combined with clinical observations, patient history, and epidemiological information. The expected result is Negative.  Fact Sheet for Patients:  EntrepreneurPulse.com.au  Fact Sheet for Healthcare Providers:  IncredibleEmployment.be  This test is no t yet approved or cleared by the [place] FDA and  has been authorized for detection and/or diagnosis of SARS-CoV-2 by FDA under an Emergency Use Authorization (EUA). This EUA will remain  in effect (meaning this test can be used) for the duration of the COVID-19 declaration under Section 564(b)(1) of the Act, 21 U.S.C.section 360bbb-3(b)(1), unless the authorization is terminated  or revoked sooner.       Influenza A by PCR NEGATIVE NEGATIVE   Influenza B by PCR NEGATIVE NEGATIVE    Comment: (NOTE) The Xpert Xpress SARS-CoV-2/FLU/RSV plus assay is intended as an aid in the diagnosis of influenza from Nasopharyngeal swab specimens and should not be used as a sole basis for treatment. Nasal washings and aspirates are unacceptable for Xpert Xpress SARS-CoV-2/FLU/RSV testing.  Fact Sheet for Patients: EntrepreneurPulse.com.au  Fact Sheet for Healthcare Providers: IncredibleEmployment.be  This test is not yet approved or cleared by the [place]  FDA and has been authorized for detection and/or diagnosis of SARS-CoV-2 by FDA under an Emergency Use Authorization (EUA). This EUA will remain in effect (meaning this test can be used) for the duration of the COVID-19 declaration under Section 564(b)(1) of the Act, 21 U.S.C. section 360bbb-3(b)(1), unless the authorization is terminated or revoked.  Performed at [facility], [place] [address] [place], [place] [address]   Troponin I (High Sensitivity)     Status: Abnormal   Collection Time:  01/16/21  5:41 PM  Result Value Ref Range   Troponin I (High Sensitivity) 30 (H) <18 ng/L    Comment: (NOTE) Elevated high sensitivity troponin I (hsTnI) values and significant  changes across serial measurements may suggest ACS but many other  chronic and acute conditions are known to elevate hsTnI results.  Refer to the "Links" section for chest pain algorithms and additional  guidance. Performed at [facility], [place] [address] [place], [place] [address]   Troponin I (High Sensitivity)     Status: Abnormal   Collection Time: 01/16/21  7:23 PM  Result Value Ref Range   Troponin I (High Sensitivity) 28 (H) <18 ng/L    Comment: (NOTE) Elevated high sensitivity troponin I (hsTnI) values and significant  changes across serial measurements may suggest ACS but many other  chronic and acute conditions are known to elevate hsTnI results.  Refer to the "Links" section for chest pain algorithms and additional  guidance. Performed at [facility], [place] [address] [place], [place] [address]   HIV Antibody (routine testing w rflx)     Status: None   Collection Time: 01/17/21  2:48 AM  Result Value Ref Range   HIV Screen 4th Generation wRfx Non Reactive Non Reactive    Comment: Performed at [facility], [place] [address] [place], [place] [address]  Comprehensive metabolic panel     Status: Abnormal   Collection Time: 01/17/21  2:48 AM  Result Value Ref Range   Sodium 138 135 - 145 mmol/L   Potassium 3.1 (L) 3.5 - 5.1 mmol/L   Chloride 100 98 - 111 mmol/L   CO2 27 22 - 32 mmol/L   Glucose, Bld 108 (H) 70 - 99 mg/dL    Comment: Glucose reference range applies only to samples taken after fasting for at least 8 hours.   BUN 8 6 - 20 mg/dL   Creatinine, Ser 0.54 (L) 0.61 - 1.24 mg/dL   Calcium 9.3 8.9 - 10.3 mg/dL   Total Protein 7.7 6.5 - 8.1 g/dL   Albumin 4.2 3.5 - 5.0 g/dL   AST 130 (H) 15 - 41 U/L   ALT 79 (H) 0 - 44 U/L   Alkaline  Phosphatase 68 38 - 126 U/L   Total Bilirubin 1.3 (H) 0.3 - 1.2 mg/dL   GFR, Estimated >60 >60 mL/min    Comment: (NOTE) Calculated using the CKD-EPI Creatinine Equation (2021)    Anion gap 11 5 - 15    Comment: Performed at [facility], [place] [address], [place], [place] [address]  CBC     Status: Abnormal   Collection Time: 01/17/21  2:48 AM  Result Value Ref Range   WBC 4.4 4.0 - 10.5 K/uL   RBC 4.82 4.22 - 5.81 MIL/uL   Hemoglobin 15.9 13.0 - 17.0 g/dL   HCT 45.2 39.0 - 52.0 %   MCV 93.8 80.0 - 100.0 fL   MCH 33.0 26.0 - 34.0  pg   MCHC 35.2 30.0 - 36.0 g/dL   RDW 15.7 (H) 11.5 - 15.5 %   Platelets 180 150 - 400 K/uL   nRBC 0.0 0.0 - 0.2 %    Comment: Performed at [facility], [place] [address] [place], [place] [address]  Magnesium     Status: None   Collection Time: 01/17/21  2:48 AM  Result Value Ref Range   Magnesium 1.8 1.7 - 2.4 mg/dL    Comment: Performed at [facility], [place] [address] [place], [place] [address]  Phosphorus     Status: None   Collection Time: 01/17/21  2:48 AM  Result Value Ref Range   Phosphorus 3.1 2.5 - 4.6 mg/dL    Comment: Performed at [facility], [place] [address] [place], [place] [address]  Comprehensive metabolic panel     Status: Abnormal   Collection Time: 01/18/21  4:23 AM  Result Value Ref Range   Sodium 135 135 - 145 mmol/L   Potassium 3.1 (L) 3.5 - 5.1 mmol/L   Chloride 102 98 - 111 mmol/L   CO2 23 22 - 32 mmol/L   Glucose, Bld 102 (H) 70 - 99 mg/dL    Comment: Glucose reference range applies only to samples taken after fasting for at least 8 hours.   BUN 8 6 - 20 mg/dL   Creatinine, Ser 0.53 (L) 0.61 - 1.24 mg/dL   Calcium 9.3 8.9 - 10.3 mg/dL   Total Protein 7.4 6.5 - 8.1 g/dL   Albumin 3.9 3.5 - 5.0 g/dL   AST 83 (H) 15 - 41 U/L   ALT 63 (H) 0 - 44 U/L   Alkaline Phosphatase 65 38 - 126 U/L   Total Bilirubin 1.1 0.3 - 1.2 mg/dL   GFR, Estimated >60  >60 mL/min    Comment: (NOTE) Calculated using the CKD-EPI Creatinine Equation (2021)    Anion gap 10 5 - 15    Comment: Performed at [facility], [place] [address] [place], [place] [address]  ECHOCARDIOGRAM COMPLETE     Status: None   Collection Time: 01/18/21  1:09 PM  Result Value Ref Range   Weight 2,349.22 oz   Height 66 in   BP 135/90 mmHg   Single Plane A2C EF 23.1 %   Single Plane A4C EF 5.4 %   Calc EF 16.8 %   S' Lateral 4.40 cm   AR max vel 3.99 cm2   AV Area VTI 4.22 cm2   AV Mean grad 2.0 mmHg   AV Peak grad 2.8 mmHg   Ao pk vel 0.84 m/s   Area-P 1/2 8.25 cm2   AV Area mean vel 3.96 cm2  Comprehensive metabolic panel     Status: Abnormal   Collection Time: 01/19/21  4:47 AM  Result Value Ref Range   Sodium 138 135 - 145 mmol/L   Potassium 3.7 3.5 - 5.1 mmol/L   Chloride 103 98 - 111 mmol/L   CO2 22 22 - 32 mmol/L   Glucose, Bld 89 70 - 99 mg/dL    Comment: Glucose reference range applies only to samples taken after fasting for at least 8 hours.   BUN 12 6 - 20 mg/dL   Creatinine, Ser 0.65 0.61 - 1.24 mg/dL   Calcium 10.0 8.9 - 10.3 mg/dL   Total Protein 8.3 (H) 6.5 - 8.1 g/dL   Albumin 4.3 3.5 - 5.0 g/dL   AST 93 (H) 15 -  41 U/L   ALT 69 (H) 0 - 44 U/L   Alkaline Phosphatase 71 38 - 126 U/L   Total Bilirubin 1.6 (H) 0.3 - 1.2 mg/dL   GFR, Estimated >60 >60 mL/min    Comment: (NOTE) Calculated using the CKD-EPI Creatinine Equation (2021)    Anion gap 13 5 - 15    Comment: Performed at [facility], [place] [address] [place], [place] [address]  CBC     Status: None   Collection Time: 01/20/21  5:08 AM  Result Value Ref Range   WBC 9.7 4.0 - 10.5 K/uL   RBC 4.97 4.22 - 5.81 MIL/uL   Hemoglobin 16.5 13.0 - 17.0 g/dL   HCT 48.2 39.0 - 52.0 %   MCV 97.0 80.0 - 100.0 fL   MCH 33.2 26.0 - 34.0 pg   MCHC 34.2 30.0 - 36.0 g/dL   RDW 14.8 11.5 - 15.5 %   Platelets 201 150 - 400 K/uL   nRBC 0.0 0.0 - 0.2 %    Comment:  Performed at [facility], [place] [address], [place], [place] [id number]  Basic metabolic panel     Status: Abnormal   Collection Time: 01/20/21  5:08 AM  Result Value Ref Range   Sodium 135 135 - 145 mmol/L   Potassium 3.6 3.5 - 5.1 mmol/L   Chloride 99 98 - 111 mmol/L   CO2 26 22 - 32 mmol/L   Glucose, Bld 109 (H) 70 - 99 mg/dL    Comment: Glucose reference range applies only to samples taken after fasting for at least 8 hours.   BUN 17 6 - 20 mg/dL   Creatinine, Ser 0.75 0.61 - 1.24 mg/dL   Calcium 9.6 8.9 - 10.3 mg/dL   GFR, Estimated >60 >60 mL/min    Comment: (NOTE) Calculated using the CKD-EPI Creatinine Equation (2021)    Anion gap 10 5 - 15    Comment: Performed at [facility], [place] [address], [place], [place] [address]  Hepatic function panel     Status: Abnormal   Collection Time: 01/20/21  5:08 AM  Result Value Ref Range   Total Protein 8.0 6.5 - 8.1 g/dL   Albumin 4.1 3.5 - 5.0 g/dL   AST 58 (H) 15 - 41 U/L   ALT 59 (H) 0 - 44 U/L   Alkaline Phosphatase 68 38 - 126 U/L   Total Bilirubin 1.3 (H) 0.3 - 1.2 mg/dL   Bilirubin, Direct 0.2 0.0 - 0.2 mg/dL   Indirect Bilirubin 1.1 (H) 0.3 - 0.9 mg/dL    Comment: Performed at [facility], [place] [address] [place], [place] [address]  POCT I-Stat EG7     Status: Abnormal   Collection Time: 01/20/21 12:32 PM  Result Value Ref Range   pH, Ven 7.372 7.250 - 7.430   pCO2, Ven 43.1 (L) 44.0 - 60.0 mmHg   pO2, Ven 36.0 32.0 - 45.0 mmHg   Bicarbonate 25.0 20.0 - 28.0 mmol/L   TCO2 26 22 - 32 mmol/L   O2 Saturation 67.0 %   Acid-Base Excess 0.0 0.0 - 2.0 mmol/L   Sodium 136 135 - 145 mmol/L   Potassium 3.6 3.5 - 5.1 mmol/L   Calcium, Ion 1.30 1.15 - 1.40 mmol/L   HCT 44.0 39.0 - 52.0 %   Hemoglobin 15.0 13.0 - 17.0 g/dL   Sample type VENOUS    Comment NOTIFIED PHYSICIAN   POCT I-Stat EG7  Status: Abnormal   Collection Time: 01/20/21 12:32 PM  Result Value Ref Range    pH, Ven 7.363 7.250 - 7.430   pCO2, Ven 43.4 (L) 44.0 - 60.0 mmHg   pO2, Ven 35.0 32.0 - 45.0 mmHg   Bicarbonate 24.7 20.0 - 28.0 mmol/L   TCO2 26 22 - 32 mmol/L   O2 Saturation 64.0 %   Acid-base deficit 1.0 0.0 - 2.0 mmol/L   Sodium 136 135 - 145 mmol/L   Potassium 3.7 3.5 - 5.1 mmol/L   Calcium, Ion 1.30 1.15 - 1.40 mmol/L   HCT 44.0 39.0 - 52.0 %   Hemoglobin 15.0 13.0 - 17.0 g/dL   Sample type VENOUS    Comment NOTIFIED PHYSICIAN   I-STAT 7, (LYTES, BLD GAS, ICA, H+H)     Status: Abnormal   Collection Time: 01/20/21 12:47 PM  Result Value Ref Range   pH, Arterial 7.415 7.350 - 7.450   pCO2 arterial 37.7 32.0 - 48.0 mmHg   pO2, Arterial 148 (H) 83.0 - 108.0 mmHg   Bicarbonate 24.2 20.0 - 28.0 mmol/L   TCO2 25 22 - 32 mmol/L   O2 Saturation 99.0 %   Acid-Base Excess 0.0 0.0 - 2.0 mmol/L   Sodium 135 135 - 145 mmol/L   Potassium 3.6 3.5 - 5.1 mmol/L   Calcium, Ion 1.31 1.15 - 1.40 mmol/L   HCT 43.0 39.0 - 52.0 %   Hemoglobin 14.6 13.0 - 17.0 g/dL   Sample type ARTERIAL   CBC with Differential/Platelet     Status: Abnormal   Collection Time: 01/21/21  8:59 AM  Result Value Ref Range   WBC 7.7 4.0 - 10.5 K/uL   RBC 4.59 4.22 - 5.81 MIL/uL   Hemoglobin 15.1 13.0 - 17.0 g/dL   HCT 44.4 39.0 - 52.0 %   MCV 96.7 80.0 - 100.0 fL   MCH 32.9 26.0 - 34.0 pg   MCHC 34.0 30.0 - 36.0 g/dL   RDW 14.6 11.5 - 15.5 %   Platelets 215 150 - 400 K/uL   nRBC 0.0 0.0 - 0.2 %   Neutrophils Relative % 61 %   Neutro Abs 4.7 1.7 - 7.7 K/uL   Lymphocytes Relative 22 %   Lymphs Abs 1.7 0.7 - 4.0 K/uL   Monocytes Relative 14 %   Monocytes Absolute 1.1 (H) 0.1 - 1.0 K/uL   Eosinophils Relative 1 %   Eosinophils Absolute 0.1 0.0 - 0.5 K/uL   Basophils Relative 1 %   Basophils Absolute 0.1 0.0 - 0.1 K/uL   Immature Granulocytes 1 %   Abs Immature Granulocytes 0.06 0.00 - 0.07 K/uL    Comment: Performed at [facility] [place] [address], [place], [place] [address]   Comprehensive metabolic panel     Status: Abnormal   Collection Time: 01/21/21  8:59 AM  Result Value Ref Range   Sodium 134 (L) 135 - 145 mmol/L   Potassium 3.4 (L) 3.5 - 5.1 mmol/L   Chloride 104 98 - 111 mmol/L   CO2 22 22 - 32 mmol/L   Glucose, Bld 125 (H) 70 - 99 mg/dL    Comment: Glucose reference range applies only to samples taken after fasting for at least 8 hours.   BUN 14 6 - 20 mg/dL   Creatinine, Ser 0.59 (L) 0.61 - 1.24 mg/dL   Calcium 9.4 8.9 - 10.3 mg/dL   Total Protein 7.4 6.5 - 8.1 g/dL   Albumin 3.9 3.5 - 5.0 g/dL  AST 42 (H) 15 - 41 U/L   ALT 48 (H) 0 - 44 U/L   Alkaline Phosphatase 65 38 - 126 U/L   Total Bilirubin 1.1 0.3 - 1.2 mg/dL   GFR, Estimated >60 >60 mL/min    Comment: (NOTE) Calculated using the CKD-EPI Creatinine Equation (2021)    Anion gap 8 5 - 15    Comment: Performed at [facility], [place] [address] [place], [place] [address]  Magnesium     Status: None   Collection Time: 01/21/21  8:59 AM  Result Value Ref Range   Magnesium 1.9 1.7 - 2.4 mg/dL    Comment: Performed at [facility], [place] [address] [place], [place] [address]  Phosphorus     Status: None   Collection Time: 01/21/21  8:59 AM  Result Value Ref Range   Phosphorus 4.0 2.5 - 4.6 mg/dL    Comment: Performed at [facility], [place] [address], [place], [place] [address]  TSH     Status: None   Collection Time: 01/21/21  8:59 AM  Result Value Ref Range   TSH 2.046 0.350 - 4.500 uIU/mL    Comment: Performed by a 3rd Generation assay with a functional sensitivity of <=0.01 uIU/mL. Performed at [facility], [place] [address], [place], [place] [address]     -------------------------------------------------------------------------- A&P:  Problem List Items Addressed This Visit     Dilated cardiomyopathy ([place]) - Primary   Other Visit Diagnoses     Alcoholic fatty liver       Relevant Medications    thiamine 123XX123 MG tablet   folic acid (FOLVITE) 1 MG tablet   Alcohol dependence in remission ([place])       Relevant Medications   thiamine 123XX123 MG tablet   folic acid (FOLVITE) 1 MG tablet   Acute on chronic combined systolic and diastolic CHF (congestive heart failure) ([place])          Patient previously on FMLA for alcohol gastritis / GERD / Diarrhea He was on intermittent FMLA. Followed by GI as well. However in interval he was hospitalized 01/16/21 to 01/21/21 and now has new diagnoses as described on chart today with Cardiac etiology - Dilated Cardiomyopathy due to alcohol toxicity causing Combined Heart Failure.  Alcohol Withdrawal Syndrome - complicated detox with DTs in hospital. Now remains alcohol free.  He is on medication management for heart function per Cardiology now.  Currently he is unable to work due to heart failure condition in setting of other secondary issues with alcohol withdrawal syndrome, GERD, Gastritis.  He was advised to remain out of work for 3 months until 03/2021 for repeat assessment by Cardiology, will need repeat ECHO.  I reviewed [place] paperwork sent to our office on 01/15/21, (prior to new hospitalization) and I called [facility] and they stated that currently they do not need an update sent from me now, and they are waiting on paperwork documentation from Cardiology and GI next. They confirmed they do not need any provider statement update from me at this time.   Meds ordered this encounter  Medications   thiamine 100 MG tablet    Sig: Take 1 tablet (100 mg total) by mouth daily.    Dispense:  90 tablet    Refill:  3   folic acid (FOLVITE) 1 MG tablet    Sig: Take 1 tablet (1 mg total) by mouth daily.    Dispense:  90 tablet    Refill:  3    Follow-up: - Return in 3 months  Patient verbalizes understanding with the above medical recommendations including the limitation of remote medical advice.  Specific follow-up and call-back criteria were  given for patient to follow-up or seek medical care more urgently if needed.   - Time spent in direct consultation with patient on phone: 10 minutes   Nobie Putnam, [facility] 01/27/2021, 11:55 AM

## 2021-01-27 NOTE — Telephone Encounter (Signed)
[facility] received a Disability & Leave form for Mr. Luis Porter, form placed in Dr. Theodosia Blender box  01/27/21  Alesia Morin

## 2021-01-29 ENCOUNTER — Telehealth (HOSPITAL_COMMUNITY): Payer: Self-pay | Admitting: Pharmacy Technician

## 2021-01-29 ENCOUNTER — Other Ambulatory Visit (HOSPITAL_COMMUNITY): Payer: Self-pay

## 2021-01-29 NOTE — Telephone Encounter (Signed)
Patient Advocate Encounter   Received notification from [place] that prior authorization for Luis Porter is required.   PA submitted on CoverMyMeds Key [id number] Status is pending   Will continue to follow.

## 2021-01-31 ENCOUNTER — Encounter (HOSPITAL_COMMUNITY): Payer: Self-pay | Admitting: Emergency Medicine

## 2021-01-31 ENCOUNTER — Other Ambulatory Visit: Payer: Self-pay

## 2021-01-31 ENCOUNTER — Emergency Department (HOSPITAL_COMMUNITY): Payer: Commercial Managed Care - PPO

## 2021-01-31 ENCOUNTER — Observation Stay (HOSPITAL_COMMUNITY)
Admission: EM | Admit: 2021-01-31 | Discharge: 2021-02-01 | Disposition: A | Discharge status: Home / Self Care | Payer: Commercial Managed Care - PPO | Attending: Internal Medicine | Admitting: Internal Medicine

## 2021-01-31 DIAGNOSIS — Z87891 Personal history of nicotine dependence: Secondary | ICD-10-CM | POA: Diagnosis not present

## 2021-01-31 DIAGNOSIS — Z79899 Other long term (current) drug therapy: Secondary | ICD-10-CM | POA: Diagnosis not present

## 2021-01-31 DIAGNOSIS — E870 Hyperosmolality and hypernatremia: Secondary | ICD-10-CM

## 2021-01-31 DIAGNOSIS — Z20822 Contact with and (suspected) exposure to covid-19: Secondary | ICD-10-CM | POA: Insufficient documentation

## 2021-01-31 DIAGNOSIS — I5042 Chronic combined systolic (congestive) and diastolic (congestive) heart failure: Secondary | ICD-10-CM | POA: Diagnosis not present

## 2021-01-31 DIAGNOSIS — Y900 Blood alcohol level of less than 20 mg/100 ml: Secondary | ICD-10-CM | POA: Diagnosis not present

## 2021-01-31 DIAGNOSIS — R42 Dizziness and giddiness: Secondary | ICD-10-CM | POA: Diagnosis not present

## 2021-01-31 DIAGNOSIS — I10 Essential (primary) hypertension: Secondary | ICD-10-CM | POA: Diagnosis not present

## 2021-01-31 DIAGNOSIS — I110 Hypertensive heart disease with heart failure: Secondary | ICD-10-CM | POA: Insufficient documentation

## 2021-01-31 DIAGNOSIS — R61 Generalized hyperhidrosis: Secondary | ICD-10-CM

## 2021-01-31 DIAGNOSIS — R55 Syncope and collapse: Principal | ICD-10-CM

## 2021-01-31 DIAGNOSIS — K219 Gastro-esophageal reflux disease without esophagitis: Secondary | ICD-10-CM | POA: Diagnosis not present

## 2021-01-31 HISTORY — DX: Chronic systolic (congestive) heart failure: I50.22

## 2021-01-31 LAB — BASIC METABOLIC PANEL
Anion gap: 11 (ref 5–15)
BUN: 5 mg/dL — ABNORMAL LOW (ref 6–20)
CO2: 25 mmol/L (ref 22–32)
Calcium: 8.7 mg/dL — ABNORMAL LOW (ref 8.9–10.3)
Chloride: 108 mmol/L (ref 98–111)
Creatinine, Ser: 0.73 mg/dL (ref 0.61–1.24)
GFR, Estimated: >60 mL/min (ref >60)
Glucose, Bld: 97 mg/dL (ref 70–99)
Potassium: 3.5 mmol/L (ref 3.5–5.1)
Sodium: 144 mmol/L (ref 135–145)

## 2021-01-31 LAB — COMPREHENSIVE METABOLIC PANEL
ALT: 23 U/L (ref 0–44)
AST: 28 U/L (ref 15–41)
Albumin: 3.8 g/dL (ref 3.5–5.0)
Alkaline Phosphatase: 53 U/L (ref 38–126)
Anion gap: 8 (ref 5–15)
BUN: <5 mg/dL — ABNORMAL LOW (ref 6–20)
CO2: 29 mmol/L (ref 22–32)
Calcium: 8.8 mg/dL — ABNORMAL LOW (ref 8.9–10.3)
Chloride: 113 mmol/L — ABNORMAL HIGH (ref 98–111)
Creatinine, Ser: 0.66 mg/dL (ref 0.61–1.24)
GFR, Estimated: >60 mL/min (ref >60)
Glucose, Bld: 96 mg/dL (ref 70–99)
Potassium: 3.9 mmol/L (ref 3.5–5.1)
Sodium: 150 mmol/L — ABNORMAL HIGH (ref 135–145)
Total Bilirubin: 0.3 mg/dL (ref 0.3–1.2)
Total Protein: 7.2 g/dL (ref 6.5–8.1)

## 2021-01-31 LAB — URINALYSIS, COMPLETE (UACMP) WITH MICROSCOPIC
Bacteria, UA: NONE SEEN
Bilirubin Urine: NEGATIVE
Glucose, UA: NEGATIVE mg/dL
Hgb urine dipstick: NEGATIVE
Ketones, ur: NEGATIVE mg/dL
Leukocytes,Ua: NEGATIVE
Nitrite: NEGATIVE
Protein, ur: NEGATIVE mg/dL
Specific Gravity, Urine: <1.005 — ABNORMAL LOW (ref 1.005–1.030)
pH: 6 (ref 5.0–8.0)

## 2021-01-31 LAB — CBC WITH DIFFERENTIAL/PLATELET
Abs Immature Granulocytes: 0.1 10*3/uL — ABNORMAL HIGH (ref 0.00–0.07)
Basophils Absolute: 0.2 10*3/uL — ABNORMAL HIGH (ref 0.0–0.1)
Basophils Relative: 3 %
Eosinophils Absolute: 0.2 10*3/uL (ref 0.0–0.5)
Eosinophils Relative: 3 %
HCT: 42.9 % (ref 39.0–52.0)
Hemoglobin: 14.7 g/dL (ref 13.0–17.0)
Immature Granulocytes: 1 %
Lymphocytes Relative: 47 %
Lymphs Abs: 3.3 10*3/uL (ref 0.7–4.0)
MCH: 33.5 pg (ref 26.0–34.0)
MCHC: 34.3 g/dL (ref 30.0–36.0)
MCV: 97.7 fL (ref 80.0–100.0)
Monocytes Absolute: 0.5 10*3/uL (ref 0.1–1.0)
Monocytes Relative: 7 %
Neutro Abs: 2.8 10*3/uL (ref 1.7–7.7)
Neutrophils Relative %: 39 %
Platelets: 657 10*3/uL — ABNORMAL HIGH (ref 150–400)
RBC: 4.39 MIL/uL (ref 4.22–5.81)
RDW: 15.2 % (ref 11.5–15.5)
WBC: 7.1 10*3/uL (ref 4.0–10.5)
nRBC: 0 % (ref 0.0–0.2)

## 2021-01-31 LAB — RESP PANEL BY RT-PCR (FLU A&B, COVID) ARPGX2
Influenza A by PCR: NEGATIVE
Influenza B by PCR: NEGATIVE
SARS Coronavirus 2 by RT PCR: NEGATIVE

## 2021-01-31 LAB — RAPID URINE DRUG SCREEN, HOSP PERFORMED
Amphetamines: NOT DETECTED
Barbiturates: NOT DETECTED
Benzodiazepines: POSITIVE — AB
Cocaine: NOT DETECTED
Opiates: NOT DETECTED
Tetrahydrocannabinol: NOT DETECTED

## 2021-01-31 LAB — ETHANOL: Alcohol, Ethyl (B): 147 mg/dL — ABNORMAL HIGH (ref <10)

## 2021-01-31 LAB — BRAIN NATRIURETIC PEPTIDE: B Natriuretic Peptide: 31.2 pg/mL (ref 0.0–100.0)

## 2021-01-31 LAB — CREATININE, URINE, RANDOM: Creatinine, Urine: 17.95 mg/dL

## 2021-01-31 LAB — TROPONIN I (HIGH SENSITIVITY)
Troponin I (High Sensitivity): 10 ng/L (ref <18)
Troponin I (High Sensitivity): 8 ng/L

## 2021-01-31 LAB — SODIUM, URINE, RANDOM: Sodium, Ur: 31 mmol/L

## 2021-01-31 LAB — MAGNESIUM: Magnesium: 2.3 mg/dL (ref 1.7–2.4)

## 2021-01-31 IMAGING — DX DG CHEST 1V PORT
2 series · 2 of 2 positions shown · non-contrast
Comparison: 01/16/2021

CLINICAL DATA: Per order: Pezhi Sigmund alarm, near syncope with
diaphoresis. Pt IVANA ROGEL from home with c/o generalized weakness and
dizziness x 1 month. Pt reported he just stopped drinking 2 weeks
ago and was diagnosed with CHF

EXAM:
PORTABLE CHEST - 1 VIEW

[chest ap (1 of 2)]
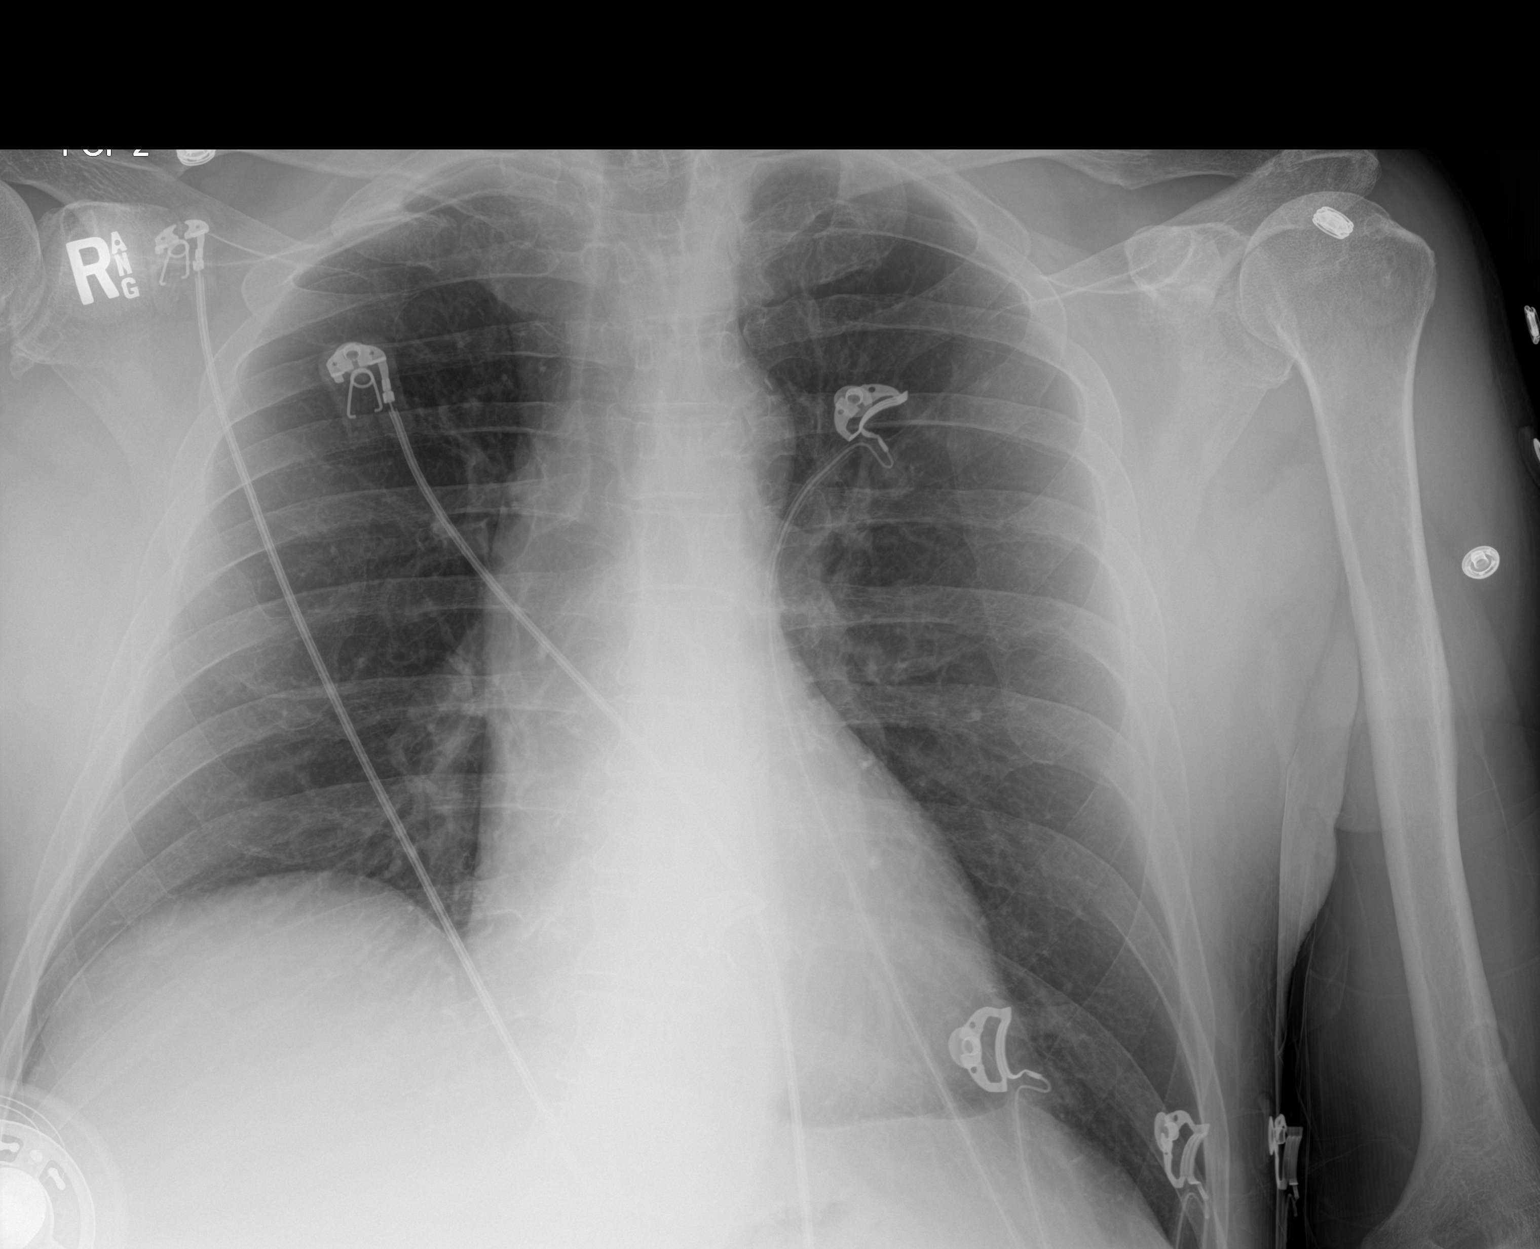

[chest ap (2 of 2)]
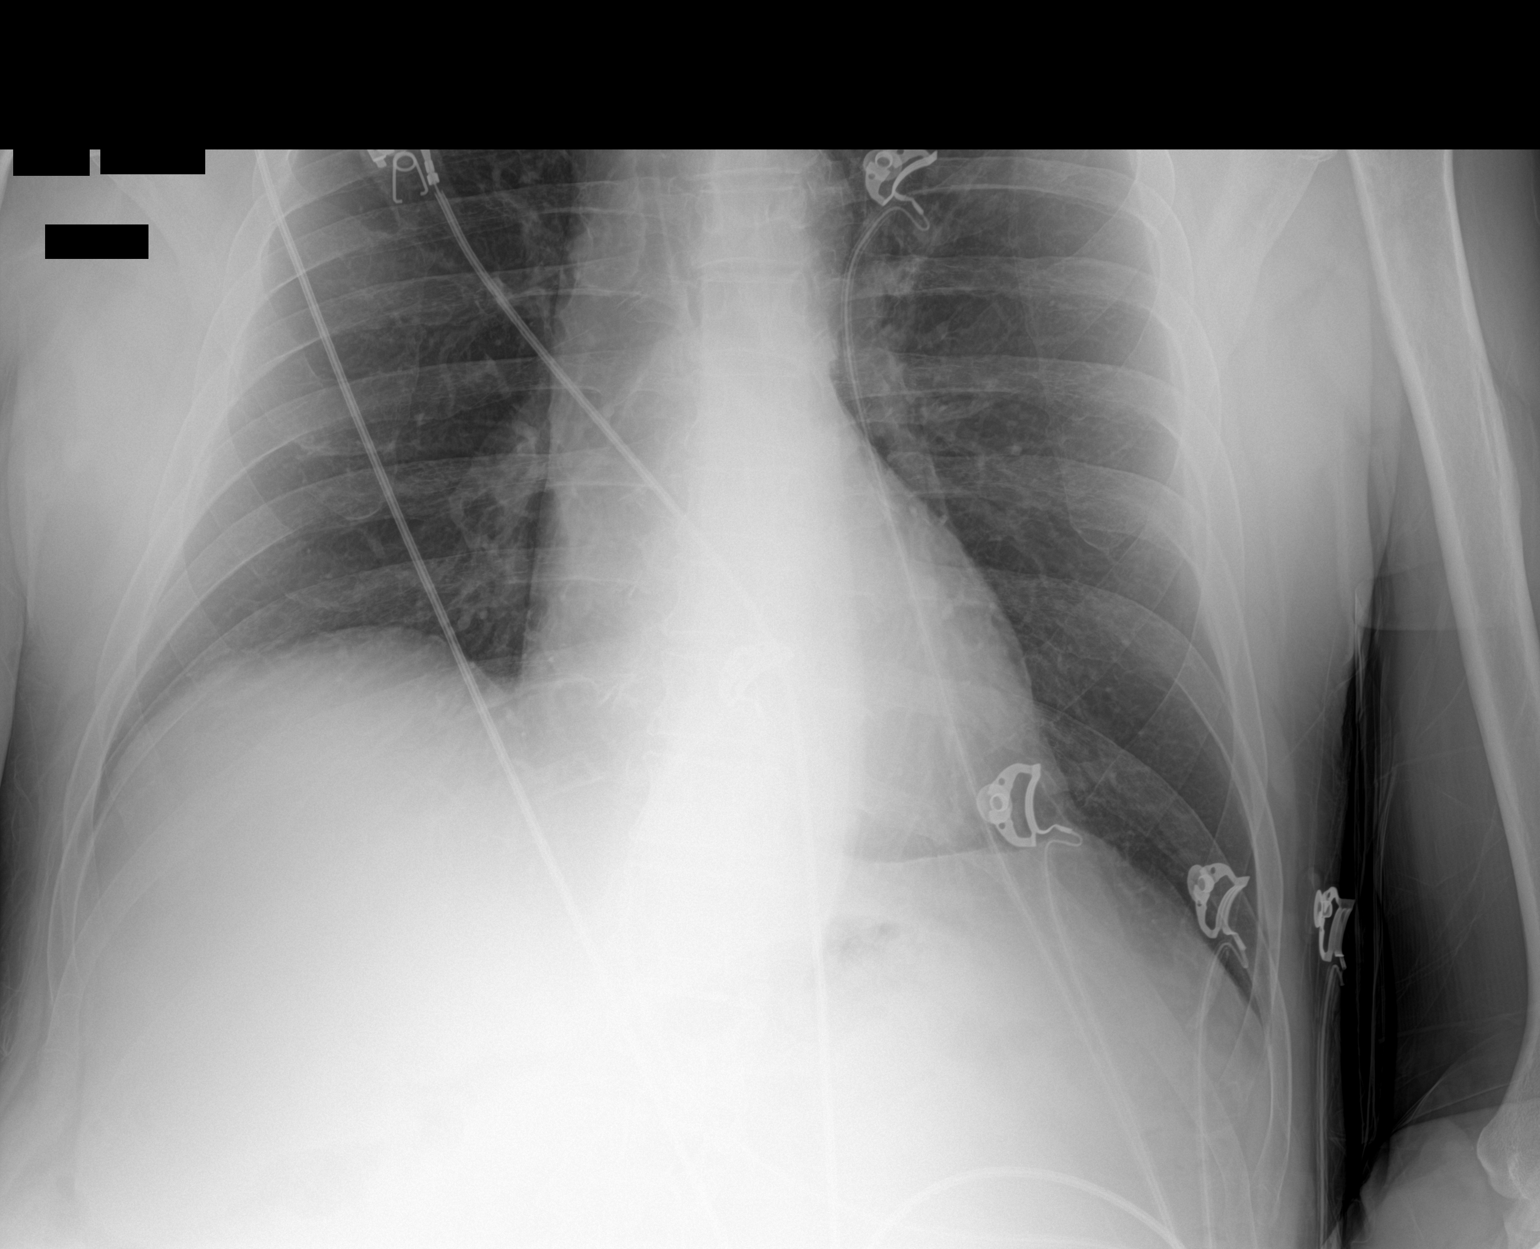

[2 of 2 positions shown; findings below may reference images not displayed]

FINDINGS: Lungs are clear.

Heart size and mediastinal contours are within normal limits.

No effusion.  No pneumothorax.

Visualized bones unremarkable.
IMPRESSION: No acute cardiopulmonary disease.

## 2021-01-31 MED ORDER — PANTOPRAZOLE SODIUM 40 MG PO TBEC
40.0000 mg | DELAYED_RELEASE_TABLET | Freq: Two times a day (BID) | ORAL | Status: DC
  Administered 2021-02-01 (×2): 40 mg via ORAL
  Filled 2021-01-31 (×2): qty 1

## 2021-01-31 MED ORDER — POTASSIUM CHLORIDE CRYS ER 20 MEQ PO TBCR
40.0000 meq | EXTENDED_RELEASE_TABLET | Freq: Once | ORAL | Status: AC
  Administered 2021-02-01: 40 meq via ORAL
  Filled 2021-01-31: qty 2

## 2021-01-31 MED ORDER — FOLIC ACID 1 MG PO TABS
1.0000 mg | ORAL_TABLET | Freq: Every day | ORAL | Status: DC
  Administered 2021-02-01: 1 mg via ORAL
  Filled 2021-01-31: qty 1

## 2021-01-31 MED ORDER — LACTATED RINGERS IV SOLN: INTRAVENOUS | Status: AC

## 2021-01-31 MED ORDER — METOPROLOL SUCCINATE ER 50 MG PO TB24: 50.0000 mg | ORAL_TABLET | Freq: Every day | ORAL | Status: DC

## 2021-01-31 MED ORDER — NITROGLYCERIN 0.3 MG SL SUBL
0.3000 mg | SUBLINGUAL_TABLET | SUBLINGUAL | Status: DC | PRN
Start: 2021-01-31 — End: 2021-02-01
  Filled 2021-01-31: qty 100

## 2021-01-31 MED ORDER — SPIRONOLACTONE 25 MG PO TABS
25.0000 mg | ORAL_TABLET | Freq: Every day | ORAL | Status: DC
  Administered 2021-02-01: 25 mg via ORAL
  Filled 2021-01-31: qty 1

## 2021-01-31 MED ORDER — ACETAMINOPHEN 325 MG PO TABS
650.0000 mg | ORAL_TABLET | Freq: Four times a day (QID) | ORAL | Status: DC | PRN
  Filled 2021-01-31: qty 2

## 2021-01-31 MED ORDER — ADULT MULTIVITAMIN W/MINERALS CH
1.0000 | ORAL_TABLET | Freq: Every day | ORAL | Status: DC
  Administered 2021-02-01: 1 via ORAL
  Filled 2021-01-31: qty 1

## 2021-01-31 MED ORDER — ACETAMINOPHEN 650 MG RE SUPP: 650.0000 mg | Freq: Four times a day (QID) | RECTAL | Status: DC | PRN

## 2021-01-31 MED ORDER — THIAMINE HCL 100 MG PO TABS
100.0000 mg | ORAL_TABLET | Freq: Every day | ORAL | Status: DC
  Administered 2021-02-01: 100 mg via ORAL
  Filled 2021-01-31: qty 1

## 2021-01-31 NOTE — H&P (Signed)
History and Physical    PLEASE NOTE THAT DRAGON DICTATION SOFTWARE WAS USED IN THE CONSTRUCTION OF THIS NOTE.   Luis Porter [id number] DOB: 01-31-1961 DOA: 01/31/2021  PCP: Olin Hauser, DO Patient coming from: home   I have personally briefly reviewed patient's old medical records in [place]  Chief Complaint: Presyncope  HPI: Luis Porter is a 60 y.o. 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   The patient reports 1 episode of diaphoresis associated with a sensation that he was going to lose consciousness that occurred at 1300 today.  He reports that he was stationary at the time of this episode, sitting on the sofa at home while on the phone talking with his sister-in-law about his wife who is recently admitted to rehab for chronic alcohol abuse.  He states that he did not lose consciousness as a consequence of this episode, and notes that the associated diaphoresis and lightheadedness lasted for 10 seconds or less before spontaneously resolving without subsequent recurrence.  Denies any preceding week duration, and denies any associated acute focal weakness, acute focal numbness, paresthesias, slurred speech, facial droop, dysphagia, or vertigo.  Not associate with any new onset tremors.  Denies any associated or recent chest pain, shortness of breath, palpitations. the patient reports that he did not fall As a consequence of this episode of diaphoresis and lightheadedness.   Of note, the patient was recently hospitalized for new diagnosis of combined systolic/diastolic heart failure with most recent echocardiogram on 01/18/2021 demonstrating LVEF 20 to 25% with global hypokinesis, mild LVH, grade 1 diastolic  dysfunction, no evidence of significant valvular pathology.  During his previous hospitalization he underwent cardiac catheterization on 01/20/2021, which showed no evidence of obstructive coronary artery disease.  His heart failure was consequently felt by cardiology to be nonischemic in etiology, but rather felt to be as a consequence of chronic alcohol abuse.  He was subsequently discharged with a LifeVest, which he notes he has been very compliant in wearing since that time.  He conveys that his life vest alarmed twice while he was asleep on the evening of 01/30/2021 heading into this morning.  While the LifeVest alarm woke him from sleep, he denies experiencing any acute symptoms at that time, and went back to sleep until this morning, when he awoke completely asymptomatic.  He remained completely asymptomatic until 1300 this afternoon when he experienced the above episode of lightheadedness/diaphoresis, at which time he also noted the LifeVest alarm.  In the setting of this episode of diaphoresis/lightheadedness concomitant with the alarming of his LifeVest, the patient contacted EMS who brought the patient to [facility] emergency department for further evaluation and management of the above.  The patient notes that as a component of his discharge instructions from recent hospitalization, that he was encouraged to consume 64 ounces of water per day or less.  He reports that he has been extremely compliant with this, noting that he has consumed absolutely no free water following discharge from the hospital.  Grandmother reports that the totality of his liquid consumption over that timeframe has been via 1 cup daily coffee as well as at least a daily 12 pack of the soda beverage Zevia, which itself contains caffeine.  He notes a significant increase in the frequency and volume of  his urine output since starting to consume this 12 pack of caffeinated soda per day.  He conveys that he has consumed absolutely no  alcohol following the diagnosis of systolic/diastolic heart failure made during this most recent prior hospitalization.  Denies any use of recreational drugs.  The patient has a documented history of angioedema in response to prior use of ACE inhibitor.  Consequently, he is not on Entresto at this time.  Of note, he is also not on an ARB at this time.  He does however report compliance with his home Toprol-XL.  Additionally, his cardiac regimen includes spironolactone 25 mg p.o. daily, which he states represents a recent increase from 12.5 mg p.o. daily.   Denies any recent orthopnea or PND.  He reports that the edema that he was experiencing in his bilateral lower extremities a few weeks ago, has completely resolved after increasing his caffeine intake to 12 caffeinated sodas plus coffee per day.  Denies any recent calf tenderness or new lower extremity erythema.  No recent cough or hemoptysis.  Denies any associated subjective fever, chills, rigors, or generalized myalgias.  Denies any recent dysuria or gross hematuria.  No recent nausea/vomiting/diarrhea.  Denies any recent melena or hematochezia.  The patient conveys that he experienced an episode of syncope as he was rising from a seated to a standing position 2 weeks ago, prompting his recent hospitalization during which the new diagnosis of systolic/diastolic heart failure was made.  As a consequence of this prior episode of syncope, patient reports that he fell back into his sofa, and did not hit his head as a component as well.  Otherwise, he denies any recent falls, and has not recently hit his head via any additional trauma.  Follows with Dr. Radford Pax as his outpatient cardiologist.   ED Course:  Vital signs in the ED were notable for the following:  - Afebrile; heart rate 78-89; blood pressure 134/84 152/89; respiratory rate 15-20, oxygen saturation 97 to 98% on room air.  Labs were notable for the following: CMP was notable for the  following: Sodium 150, which is compared to most recent prior serum sodium level of 134 on 01/21/2021, potassium 3.9, chloride 113, bicarbonate 29, creatinine 0.66 relative to most recent prior serum creatinine data point of 0.59, glucose 96, and liver enzymes were found to be within normal limits.  BNP 31.  High-sensitivity troponin I x1 noted to be 10.  CBC notable for white blood cell count 7100, hemoglobin 14.7.  Screening nasopharyngeal COVID-19 PCR was performed in the ED today, with result currently pending.  Imaging and additional notable ED work-up: EKG shows sinus rhythm, heart rate 91, normal intervals, no evidence of T wave or ST changes, including no evidence of ST elevation.  Chest x-ray showed no evidence of acute cardiopulmonary process, including no evidence of infiltrate, edema, effusion, or pneumothorax.  The EDP, Dr. Sherry Ruffing, discussed the patient's case with the cardiologist, he recommended overnight observation to the hospitalist service at [facility] for further evaluation management of presenting presyncope, including close monitoring on telemetry.  Cardiology will formally consult, and interrogate data available through the patient's LifeVest.  While in the ED, the following were administered: (No medications or IV fluids administered in the ED).     Review of Systems: As per HPI otherwise 10 point review of systems negative.   Past Medical History:  Diagnosis Date   Diverticulosis    Gastric ulcer    GERD (gastroesophageal reflux disease)  History of hiatal hernia    Hypertension    Iron deficiency anemia 10/18/2019   Recurrent umbilical hernia with incarceration 11/08/2016   Previously repaired x 1    Past Surgical History:  Procedure Laterality Date   BACK SURGERY     COLONOSCOPY WITH PROPOFOL N/A 12/30/2016   Procedure: COLONOSCOPY WITH PROPOFOL;  Surgeon: [name], MD;  Location: [facility] ENDOSCOPY;  Service: Endoscopy;  Laterality: N/A;   COLONOSCOPY WITH  PROPOFOL N/A 10/31/2019   Procedure: COLONOSCOPY WITH PROPOFOL;  Surgeon: Lin Landsman, MD;  Location[facility] ENDOSCOPY;  Service: Gastroenterology;  Laterality: N/A;   ESOPHAGOGASTRODUODENOSCOPY (EGD) WITH PROPOFOL N/A 12/30/2016   Procedure: ESOPHAGOGASTRODUODENOSCOPY (EGD) WITH PROPOFOL;  Surgeon: [name], MD;  Location: [facility] ENDOSCOPY;  Service: Endoscopy;  Laterality: N/A;   ESOPHAGOGASTRODUODENOSCOPY (EGD) WITH PROPOFOL N/A 10/31/2019   Procedure: ESOPHAGOGASTRODUODENOSCOPY (EGD) WITH PROPOFOL;  Surgeon: Lin Landsman, MD;  Location: [facility] ENDOSCOPY;  Service: Gastroenterology;  Laterality: N/A;   HERNIA REPAIR  [id number]   Umbilical Hernia Repair   LUMBAR LAMINECTOMY/DECOMPRESSION MICRODISCECTOMY Right 05/18/2016   Procedure: right L4-5 microdiscectomy;  Surgeon: Blanche East, MD;  Location: [facility] ORS;  Service: Neurosurgery;  Laterality: Right;   RIGHT/LEFT HEART CATH AND CORONARY ANGIOGRAPHY N/A 01/20/2021   Procedure: RIGHT/LEFT HEART CATH AND CORONARY ANGIOGRAPHY;  Surgeon: Troy Sine, MD;  Location: [place] CV LAB;  Service: Cardiovascular;  Laterality: N/A;   TONSILLECTOMY     UMBILICAL HERNIA REPAIR N/A 06/29/2018   Procedure: LAPAROSCOPIC REPAIR OF RECURRENT UMBILICAL HERNIA WITH MESH;  Surgeon: Vickie Epley, MD;  Location: [facility] ORS;  Service: General;  Laterality: N/A;    Social History:  reports that he quit smoking about 33 years ago. His smoking use included cigarettes. He has a 50.00 pack-year smoking history. He has never used smokeless tobacco. He reports that he does not currently use alcohol after a past usage of about 14.0 standard drinks per week. He reports that he does not use drugs.   Allergies  Allergen Reactions   Benazepril     Angioedema   Naltrexone Other (See Comments)    angry   Nsaids Other (See Comments)    Due to history of ulcers     Family History  Problem Relation Age of Onset   Hypertension Mother    Cancer Mother         Ovarian   Cervical cancer Mother    Hypertension Father    Cancer Father        tongue   Throat cancer Father    Hypertension Brother    Prostate cancer Neg Hx    Breast cancer Neg Hx    Colon cancer Neg Hx    Heart attack Neg Hx    Stroke Neg Hx     Family history reviewed and not pertinent    Prior to Admission medications   Medication Sig Start Date End Date Taking? Authorizing Provider  albuterol (VENTOLIN HFA) 108 (90 Base) MCG/ACT inhaler INHALE 1-2 PUFFS INTO THE LUNGS EVERY 4 (FOUR) HOURS AS NEEDED FOR WHEEZING OR SHORTNESS OF BREATH. 04/11/20   Karamalegos, Alexander J, DO  fluticasone (FLONASE) 50 MCG/ACT nasal spray PLACE 2 SPRAYS INTO BOTH NOSTRILS DAILY. USE FOR 4-6 WEEKS THEN STOP AND USE SEASONALLY OR AS NEEDED. Patient taking differently: Place 2 sprays into both nostrils daily as needed for allergies. 07/20/20   Karamalegos, [name], DO  folic acid (FOLVITE) 1 MG tablet Take 1 tablet (1 mg total) by mouth daily.  01/27/21   Karamalegos, [name], DO  hydrOXYzine (ATARAX/VISTARIL) 25 MG tablet Take 1 tablet (25 mg total) by mouth every 6 (six) hours as needed for anxiety (or CIWA score </= 10). 01/21/21   Raiford Noble Latif, DO  meclizine (ANTIVERT) 25 MG tablet Take 1 tablet (25 mg total) by mouth 3 (three) times daily as needed for dizziness or nausea. 06/24/20   Karamalegos, [name], DO  metoprolol succinate (TOPROL-XL) 50 MG 24 hr tablet Take 1 tablet (50 mg total) by mouth daily. Take with or immediately following a meal. 01/22/21   Sheikh, Omair Latif, DO  omeprazole (PRILOSEC) 40 MG capsule TAKE 1 CAPSULE BY MOUTH TWICE A DAY Patient taking differently: Take 40 mg by mouth 2 (two) times daily. 12/20/20   Karamalegos, [name], DO  ondansetron (ZOFRAN) 4 MG tablet Take 1 tablet (4 mg total) by mouth every 6 (six) hours as needed for nausea. 01/21/21   Sheikh, Omair Latif, DO  sacubitril-valsartan (ENTRESTO) 24-26 MG Take 1 tablet by mouth 2 (two) times  daily. Patient not taking: No sig reported 01/21/21   Raiford Noble Latif, DO  spironolactone (ALDACTONE) 25 MG tablet Take 1 tablet (25 mg total) by mouth daily. 01/26/21   Katherine Roan, MD  thiamine 100 MG tablet Take 1 tablet (100 mg total) by mouth daily. 01/27/21   Olin Hauser, DO  Vitamins/Minerals TABS Take 1 tablet by mouth daily.    [provider]     Objective    Physical Exam: Vitals:   01/31/21 1445 01/31/21 1448 01/31/21 1538 01/31/21 1600  BP: (!) 152/89  134/84 138/87  Pulse:  79 89 78  Resp: '16 15 20 20  '$ Temp:  98.4 F (36.9 C)    TempSrc:  Oral    SpO2:  93% 97% 98%    General: appears to be stated age; alert, oriented Skin: warm, dry, no rash Head:  AT/[place] Mouth:  Oral mucosa membranes appear dry, normal dentition Neck: supple; trachea midline Heart:  RRR; did not appreciate any M/R/G Lungs: CTAB, did not appreciate any wheezes, rales, or rhonchi Abdomen: + BS; soft, ND, NT Vascular: 2+ pedal pulses b/l; 2+ radial pulses b/l Extremities: no peripheral edema, no muscle wasting Neuro: strength and sensation intact in upper and lower extremities b/l    Labs on Admission: I have personally reviewed following labs and imaging studies  CBC: Recent Labs  Lab 01/31/21 1654  WBC 7.1  NEUTROABS 2.8  HGB 14.7  HCT 42.9  MCV 97.7  PLT 123456*   Basic Metabolic Panel: Recent Labs  Lab 01/31/21 1654  NA 150*  K 3.9  CL 113*  CO2 29  GLUCOSE 96  BUN <5*  CREATININE 0.66  CALCIUM 8.8*  MG 2.3   GFR: Estimated Creatinine Clearance: 89.7 mL/min (by C-G formula based on SCr of 0.66 mg/dL). Liver Function Tests: Recent Labs  Lab 01/31/21 1654  AST 28  ALT 23  ALKPHOS 53  BILITOT 0.3  PROT 7.2  ALBUMIN 3.8   No results for input(s): LIPASE, AMYLASE in the last 168 hours. No results for input(s): AMMONIA in the last 168 hours. Coagulation Profile: No results for input(s): INR, PROTIME in the last 168 hours. Cardiac  Enzymes: No results for input(s): CKTOTAL, CKMB, CKMBINDEX, TROPONINI in the last 168 hours. BNP (last 3 results) No results for input(s): PROBNP in the last 8760 hours. HbA1C: No results for input(s): HGBA1C in the last 72 hours. CBG: No results for  input(s): GLUCAP in the last 168 hours. Lipid Profile: No results for input(s): CHOL, HDL, LDLCALC, TRIG, CHOLHDL, LDLDIRECT in the last 72 hours. Thyroid Function Tests: No results for input(s): TSH, T4TOTAL, FREET4, T3FREE, THYROIDAB in the last 72 hours. Anemia Panel: No results for input(s): VITAMINB12, FOLATE, FERRITIN, TIBC, IRON, RETICCTPCT in the last 72 hours. Urine analysis:    Component Value Date/Time   COLORURINE YELLOW (A) 07/23/2020 1304   APPEARANCEUR CLEAR (A) 07/23/2020 1304   LABSPEC 1.004 (L) 07/23/2020 1304   PHURINE 5.0 07/23/2020 1304   GLUCOSEU NEGATIVE 07/23/2020 1304   HGBUR MODERATE (A) 07/23/2020 1304   BILIRUBINUR NEGATIVE 07/23/2020 1304   KETONESUR 5 (A) 07/23/2020 1304   PROTEINUR 30 (A) 07/23/2020 1304   NITRITE NEGATIVE 07/23/2020 1304   LEUKOCYTESUR NEGATIVE 07/23/2020 1304    Radiological Exams on Admission: DG Chest Portable 1 View  Result Date: 01/31/2021 CLINICAL DATA:  Per order: life vest alarm, near syncope with diaphoresis. Pt BIB EMS from home with c/o generalized weakness and dizziness x 1 month. Pt reported he just stopped drinking 2 weeks ago and was diagnosed with CHF EXAM: PORTABLE CHEST - 1 VIEW COMPARISON:  01/16/2021 FINDINGS: Lungs are clear. Heart size and mediastinal contours are within normal limits. No effusion.  No pneumothorax. Visualized bones unremarkable. IMPRESSION: No acute cardiopulmonary disease. Electronically Signed   By: Lucrezia Europe M.D.   On: 01/31/2021 16:15     EKG: Independently reviewed, with result as described above.    Assessment/Plan   Olivier Lesher Fairbank is a 59 y.o. male with medical history significant for nonischemic chronic combined  systolic/diastolic heart failure, essential hypertension, GERD, generalized anxiety disorder, who is admitted to [facility] on 01/31/2021 with presyncope after presenting from home to [facility] ED complaining of such.    Principal Problem:   Postural dizziness with presyncope Active Problems:   Essential hypertension   GERD (gastroesophageal reflux disease)   Hypernatremia   Chronic combined systolic and diastolic heart failure (HCC)     #) Presyncope: 1 episode of presyncope occurring 1300 daily and associated with diaphoresis and lightheadedness without associated syncope or fall.  Appears to have been nonpositional, as the patient reports that this episode occurred while he was sitting on his sofa talking on the phone about his wife who was recently admitted for rehab in the setting of chronic alcohol abuse, recent possibility of vasovagal contribution given the patient's report of a high degree of stress that he has been under associated with his wife's alcohol abuse and recent admission for rehab.  Given the appearance of dehydration given no water consumption over the last week concomitant with consumption of 12+ caffeinated sodas plus coffee daily, differential also includes orthostatic hypotension.  Will Check orthostatic vital signs to further assess.  In the context of Recent alarming of the patient's LifeVest, cardiology has been consulted, recommending overnight observation to med telemetry.  Cardiology to formally see the patient and plans to interrogate historical data from patient's LifeVest.  At this time, ACS appears to be less likely in the absence of any recent chest pain, with EKG showing no evidence of acute ischemic changes, and no elevation in troponin in spite of first LifeVest alarm occurring greater than 12 hours ago providing Time for interval elevation of troponin.  Debbi Strandberg, will closely monitor on telemetry overnight, as above.  No clinical evidence to suggest acute CVA  or stroke at this time.  Plan: Monitor on telemetry.  Trend serial troponin.  Nursing communication orders been placed for tracking and documentation of 1 set of orthostatic vital signs.  Repeat BMP and CBC in the morning.  Optimize serum potassium level with potassium chloride 40 mEq p.o. x1.  Cardiology consulted, interval interrogation of LifeVest.  Repeat serum magnesium level in the morning.  LR at 50 cc/h x 8 hours to be initiated following completion of orthostatic vital signs.      #) Hypovolemic hypernatremia: Presenting serum sodium found to be 150 relative to most recent prior value of 134 on 01/21/2021.  Initially, considered the possibility of recent changes in ADH feedback loop following patient's complete discontinuation of alcohol consumption after chronic ADH suppression as a result of this prior chronic alcohol abuse, which may have been mitigated by hyponatremic influences from the consumption of alcohol itself, including that from beer drinkers potomania. However, I think that the differential at this time appears to favor free water deficiency as a consequence of both decreased in recent free water intake as well as concomitant increase in free water level in the setting of the patient's report of no free water consumption over the last week while consuming 12+ caffeinated sodas per day resulting in renal losses of sodium as a consequence of the resultant diuretic effect. Will further evaluate via urine studies, as further detailed below, and provide gentle IV fluids for rehydration purposes, will closely monitoring for any evidence of acute volume overload in the setting of a history of chronic combined heart failure, as above.  Of note, TSH was recently checked on 01/21/2021 and found to be within normal limits.  Denies any lithium use.  Plan: Check urinalysis, random urine sodium, random urine creatinine, urine osmolality, serum osmolality.  Monitor strict I's and O's and daily  weights.  Repeat BMP at 2300 for monitoring of interval trend in serum sodium.  Repeat CMP in the morning.  Additionally, ordered repeat BMP at 9 AM tomorrow morning as well, to complete every 4 hour trending of serum sodium level overnight.  Lactated Ringer's at 50 cc x 8 hours.       #) Chronic systolic/diastolic heart failure: With most recent echocardiogram performed on 01/18/2021, as further detailed above, including demonstrating LVEF 20 to 25% as well as grade 1 diastolic dysfunction.  Felt to be nonischemic in nature following recent cardiac catheterization showing no evidence of obstructive coronary artery disease, as further detailed above.  Rather, I is felt by cardiology to be more likely on the basis of patient's history of chronic alcohol abuse, as above.  Outpatient cardiac medications include spironolactone and Toprol-XL.  Of note, in the setting of the patient's report of angioedema in response to prior ACE inhibitor use, he is not currently on Entresto. Will consider initiation of ARB for its associated mortality benefit, with additional recommendations to follow via cardiology consultation.  No clinical or radiographic evidence to suggest volume overload including nonelevated BNP at 31, while chest x-ray shows evidence of acute cardiopulmonary process.  Rather, patient appears clinically slightly dehydrated, as further detailed above.  We will supplement her potassium level in order to keep this level at greater than or equal to 4.0.  Of note, serum magnesium level found to be 2.3.  Plan: Continue Toprol-XL.  Home spironolactone ordered for resumption tomorrow morning, with close monitoring of assessment of volume status.  Monitor strict I's and O's and daily weights.  Cardiology consulted, with possibility of initiation of ARB given reported history of angioedema to ACE inhibitor, as further detailed  above.  In the setting of reported recent LifeVest alarm, will monitor on telemetry  overnight.  Optimization of serum potassium level via potassium chloride 40 mill equivalents p.o. x1.  Repeat BMP in the morning as well as serum magnesium level at that time.      #) GERD: Documented history of such, on omeprazole twice daily as an outpatient.  Plan: Resume home PPI.       #) Essential hypertension: Documented history of such, with outpatient hypertensive regimen currently including Toprol-XL as well as spironolactone.  Consideration for initiation of ARB for mortality benefit associated with the patient's degree of systolic dysfunction heart failure in the context of the confines posed by reported history of angioedema to ACE inhibitor's, as further detailed above.  Plan: Continue home Toprol-XL and spironolactone.  Consideration for initiation of ARB with ensuing cardiology consultation/recs to follow, as above.  Close monitoring of ensuing blood pressure via routine vital signs.      #) History of chronic alcohol abuse: The patient reports absolutely no alcohol consumption following diagnosis of combined systolic/diastolic heart failure in August 2022.  He reports good compliance with his home supplementation of thiamine, folic acid, and multivitamin.  No clinical evidence to suggest acute alcohol intoxication at this time.  Plan: Continue home daily oral supplementation via thiamine, folic acid, and multivitamin.  Add on serum ethanol level and check UDS.     DVT prophylaxis: SCDs Code Status: Full code Family Communication: none Disposition Plan: Per Rounding Team Consults called: Cardiology consulted, as further detailed above;  Admission status: Observation, PCU     Of note, this patient was added by me to the following Admit List/Treatment Team: [email or web address].      PLEASE NOTE THAT DRAGON DICTATION SOFTWARE WAS USED IN THE CONSTRUCTION OF THIS NOTE.   Marissa [facility] Pager [phone] From [place]  Otherwise, please  contact night-coverage  [email or web address] Password [email or web address]   01/31/2021, 6:30 PM

## 2021-01-31 NOTE — ED Triage Notes (Signed)
BIBA Per EMS: Pt coming from home with c/o generalized weakness and dizziness x1 month ; just stopped drinking 2wks ago  Dx with CHF 20th of August ; pt sent home with defibrillator that began alerting him today and caused him to get concerned and come to hospital.  120 HR  134/96 137 CBG  98% RA

## 2021-01-31 NOTE — ED Provider Notes (Signed)
[facility] DEPT Provider Note   CSN: [id number] Arrival date & time: 01/31/21  1424     History Chief Complaint  Patient presents with   Weakness    Luis Porter is a 60 y.o. male.  The history is provided by the patient and medical records. No language interpreter was used.  Near Syncope This is a new problem. The current episode started 1 to 2 hours ago. The problem occurs rarely. The problem has been resolved. Pertinent negatives include no chest pain, no abdominal pain, no headaches and no shortness of breath. Nothing aggravates the symptoms. Nothing relieves the symptoms. He has tried nothing for the symptoms. The treatment provided no relief.      Past Medical History:  Diagnosis Date   Diverticulosis    Gastric ulcer    GERD (gastroesophageal reflux disease)    History of hiatal hernia    Hypertension    Iron deficiency anemia 10/18/2019   Recurrent umbilical hernia with incarceration 11/08/2016   Previously repaired x 1    Patient Active Problem List   Diagnosis Date Noted   Syncope    Dilated cardiomyopathy ([place])    Elevated troponin 01/16/2021   Alcohol induced fatty liver 08/04/2020   Chronic alcoholic gastritis without hemorrhage 08/04/2020   Alcohol dependence in early full remission ([place]) 08/04/2020   Angioedema 07/28/2020   Alcohol withdrawal syndrome without complication ([place])    [id number] 02/19/2020   Cough 02/19/2020   Abdominal pain, epigastric    GERD (gastroesophageal reflux disease) 09/19/2019   Acute diverticulitis 09/19/2019   Alcohol abuse 09/19/2019   Essential tremor 07/04/2019   Hx of adenomatous colonic polyps 01/06/2017   Herniated lumbar intervertebral disc 05/18/2016   Mixed hyperlipidemia 05/06/2014   BPH with obstruction/lower urinary tract symptoms 03/31/2014   Insomnia 03/03/2014   Diverticulosis of large intestine without hemorrhage 03/03/2014   Alcohol abuse, daily use 03/03/2014    Essential hypertension 07/08/2011   Hiatal hernia with gastroesophageal reflux 07/07/2011   Lumbago with sciatica 07/07/2011    Past Surgical History:  Procedure Laterality Date   BACK SURGERY     COLONOSCOPY WITH PROPOFOL N/A 12/30/2016   Procedure: COLONOSCOPY WITH PROPOFOL;  Surgeon: [name], MD;  Location: [facility] ENDOSCOPY;  Service: Endoscopy;  Laterality: N/A;   COLONOSCOPY WITH PROPOFOL N/A 10/31/2019   Procedure: COLONOSCOPY WITH PROPOFOL;  Surgeon: Lin Landsman, MD;  Location: [facility] ENDOSCOPY;  Service: Gastroenterology;  Laterality: N/A;   ESOPHAGOGASTRODUODENOSCOPY (EGD) WITH PROPOFOL N/A 12/30/2016   Procedure: ESOPHAGOGASTRODUODENOSCOPY (EGD) WITH PROPOFOL;  Surgeon: [name], MD;  Location: [facility] ENDOSCOPY;  Service: Endoscopy;  Laterality: N/A;   ESOPHAGOGASTRODUODENOSCOPY (EGD) WITH PROPOFOL N/A 10/31/2019   Procedure: ESOPHAGOGASTRODUODENOSCOPY (EGD) WITH PROPOFOL;  Surgeon: Lin Landsman, MD;  Location: [facility] ENDOSCOPY;  Service: Gastroenterology;  Laterality: N/A;   HERNIA REPAIR  [id number]   Umbilical Hernia Repair   LUMBAR LAMINECTOMY/DECOMPRESSION MICRODISCECTOMY Right 05/18/2016   Procedure: right L4-5 microdiscectomy;  Surgeon: Blanche East, MD;  Location: [facility] ORS;  Service: Neurosurgery;  Laterality: Right;   RIGHT/LEFT HEART CATH AND CORONARY ANGIOGRAPHY N/A 01/20/2021   Procedure: RIGHT/LEFT HEART CATH AND CORONARY ANGIOGRAPHY;  Surgeon: Troy Sine, MD;  Location: [facility] CV LAB;  Service: Cardiovascular;  Laterality: N/A;   TONSILLECTOMY     UMBILICAL HERNIA REPAIR N/A 06/29/2018   Procedure: LAPAROSCOPIC REPAIR OF RECURRENT UMBILICAL HERNIA WITH MESH;  Surgeon: Vickie Epley, MD;  Location: [facility] ORS;  Service: General;  Laterality: N/A;  Family History  Problem Relation Age of Onset   Hypertension Mother    Cancer Mother        Ovarian   Cervical cancer Mother    Hypertension Father    Cancer Father        tongue   Throat  cancer Father    Hypertension Brother    Prostate cancer Neg Hx    Breast cancer Neg Hx    Colon cancer Neg Hx    Heart attack Neg Hx    Stroke Neg Hx     Social History   Tobacco Use   Smoking status: Former    Packs/day: 2.00    Years: 25.00    Pack years: 50.00    Types: Cigarettes    Quit date: 05/31/1987    Years since quitting: 33.6   Smokeless tobacco: Never  Vaping Use   Vaping Use: Never used  Substance Use Topics   Alcohol use: Not Currently    Alcohol/week: 14.0 standard drinks    Types: 14 Cans of beer per week    Comment: former usage. States he drinks around 2 beer a day   Drug use: No    Home Medications Prior to Admission medications   Medication Sig Start Date End Date Taking? Authorizing Provider  albuterol (VENTOLIN HFA) 108 (90 Base) MCG/ACT inhaler INHALE 1-2 PUFFS INTO THE LUNGS EVERY 4 (FOUR) HOURS AS NEEDED FOR WHEEZING OR SHORTNESS OF BREATH. 04/11/20   Karamalegos, Alexander J, DO  fluticasone (FLONASE) 50 MCG/ACT nasal spray PLACE 2 SPRAYS INTO BOTH NOSTRILS DAILY. USE FOR 4-6 WEEKS THEN STOP AND USE SEASONALLY OR AS NEEDED. Patient taking differently: Place 2 sprays into both nostrils daily as needed for allergies. 07/20/20   Karamalegos, [name], DO  folic acid (FOLVITE) 1 MG tablet Take 1 tablet (1 mg total) by mouth daily. 01/27/21   Karamalegos, [name], DO  hydrOXYzine (ATARAX/VISTARIL) 25 MG tablet Take 1 tablet (25 mg total) by mouth every 6 (six) hours as needed for anxiety (or CIWA score </= 10). 01/21/21   Raiford Noble Latif, DO  meclizine (ANTIVERT) 25 MG tablet Take 1 tablet (25 mg total) by mouth 3 (three) times daily as needed for dizziness or nausea. 06/24/20   Karamalegos, [name], DO  metoprolol succinate (TOPROL-XL) 50 MG 24 hr tablet Take 1 tablet (50 mg total) by mouth daily. Take with or immediately following a meal. 01/22/21   Sheikh, Omair Latif, DO  omeprazole (PRILOSEC) 40 MG capsule TAKE 1 CAPSULE BY MOUTH TWICE A  DAY Patient taking differently: Take 40 mg by mouth 2 (two) times daily. 12/20/20   Karamalegos, [name], DO  ondansetron (ZOFRAN) 4 MG tablet Take 1 tablet (4 mg total) by mouth every 6 (six) hours as needed for nausea. 01/21/21   Sheikh, Omair Latif, DO  sacubitril-valsartan (ENTRESTO) 24-26 MG Take 1 tablet by mouth 2 (two) times daily. Patient not taking: No sig reported 01/21/21   Raiford Noble Latif, DO  spironolactone (ALDACTONE) 25 MG tablet Take 1 tablet (25 mg total) by mouth daily. 01/26/21   Katherine Roan, MD  thiamine 100 MG tablet Take 1 tablet (100 mg total) by mouth daily. 01/27/21   Olin Hauser, DO  Vitamins/Minerals TABS Take 1 tablet by mouth daily.    [provider]    Allergies    Benazepril, Naltrexone, and Nsaids  Review of Systems   Review of Systems  Constitutional:  Positive for diaphoresis and fatigue. Negative  for chills and fever.  HENT:  Negative for congestion.   Respiratory:  Negative for cough, chest tightness, shortness of breath and wheezing.   Cardiovascular:  Positive for near-syncope. Negative for chest pain.  Gastrointestinal:  Negative for abdominal pain, constipation, diarrhea, nausea and vomiting.  Genitourinary:  Negative for dysuria and frequency.  Musculoskeletal:  Negative for back pain.  Skin:  Negative for rash and wound.  Neurological:  Positive for light-headedness. Negative for dizziness, syncope (naer syncope), weakness, numbness and headaches.  Psychiatric/Behavioral:  Negative for agitation and confusion.   All other systems reviewed and are negative.  Physical Exam Updated Vital Signs BP (!) 152/89   Pulse 79   Temp 98.4 F (36.9 C) (Oral)   Resp 15   SpO2 93%   Physical Exam Vitals and nursing note reviewed.  Constitutional:      General: He is not in acute distress.    Appearance: He is well-developed. He is not ill-appearing, toxic-appearing or diaphoretic.  HENT:     Head: Normocephalic and  atraumatic.     Nose: Nose normal.     Mouth/Throat:     Mouth: Mucous membranes are moist.  Eyes:     Conjunctiva/sclera: Conjunctivae normal.  Cardiovascular:     Rate and Rhythm: Normal rate and regular rhythm.     Pulses: Normal pulses.     Heart sounds: No murmur heard. Pulmonary:     Effort: Pulmonary effort is normal. No respiratory distress.     Breath sounds: Normal breath sounds. No wheezing, rhonchi or rales.  Chest:     Chest wall: No tenderness.  Abdominal:     General: Abdomen is flat.     Palpations: Abdomen is soft.     Tenderness: There is no abdominal tenderness.  Musculoskeletal:        General: No tenderness.     Cervical back: Neck supple.  Skin:    General: Skin is warm and dry.     Capillary Refill: Capillary refill takes less than 2 seconds.     Findings: No erythema.  Neurological:     General: No focal deficit present.     Mental Status: He is alert.  Psychiatric:        Mood and Affect: Mood normal.    ED Results / Procedures / Treatments   Labs (all labs ordered are listed, but only abnormal results are displayed) Labs Reviewed  CBC WITH DIFFERENTIAL/PLATELET - Abnormal; Notable for the following components:      Result Value   Platelets 657 (*)    Basophils Absolute 0.2 (*)    Abs Immature Granulocytes 0.10 (*)    All other components within normal limits  COMPREHENSIVE METABOLIC PANEL - Abnormal; Notable for the following components:   Sodium 150 (*)    Chloride 113 (*)    BUN <5 (*)    Calcium 8.8 (*)    All other components within normal limits  BRAIN NATRIURETIC PEPTIDE  MAGNESIUM  TROPONIN I (HIGH SENSITIVITY)  TROPONIN I (HIGH SENSITIVITY)    EKG EKG Interpretation  Date/Time:  Sunday January 31 2021 15:59:48 EDT Ventricular Rate:  91 PR Interval:  165 QRS Duration: 101 QT Interval:  377 QTC Calculation: 464 R Axis:   -3 Text Interpretation: Sinus rhythm Anteroseptal infarct, old When compared to prior, similar  appearance. No STEMI Confirmed by Antony Blackbird [phone]) on 01/31/2021 4:06:18 PM  Radiology DG Chest Portable 1 View  Result Date: 01/31/2021 CLINICAL DATA:  Per order:  life vest alarm, near syncope with diaphoresis. Pt BIB EMS from home with c/o generalized weakness and dizziness x 1 month. Pt reported he just stopped drinking 2 weeks ago and was diagnosed with CHF EXAM: PORTABLE CHEST - 1 VIEW COMPARISON:  01/16/2021 FINDINGS: Lungs are clear. Heart size and mediastinal contours are within normal limits. No effusion.  No pneumothorax. Visualized bones unremarkable. IMPRESSION: No acute cardiopulmonary disease. Electronically Signed   By: Lucrezia Europe M.D.   On: 01/31/2021 16:15    Procedures Procedures   Medications Ordered in ED Medications  acetaminophen (TYLENOL) tablet 650 mg (has no administration in time range)    Or  acetaminophen (TYLENOL) suppository 650 mg (has no administration in time range)  pantoprazole (PROTONIX) EC tablet 40 mg (has no administration in time range)  potassium chloride SA (KLOR-CON) CR tablet 40 mEq (has no administration in time range)  nitroGLYCERIN (NITROSTAT) SL tablet 0.3 mg (has no administration in time range)  lactated ringers infusion (has no administration in time range)  folic acid (FOLVITE) tablet 1 mg (has no administration in time range)  metoprolol succinate (TOPROL-XL) 24 hr tablet 50 mg (has no administration in time range)  spironolactone (ALDACTONE) tablet 25 mg (has no administration in time range)  thiamine tablet 100 mg (has no administration in time range)  multivitamin with minerals tablet 1 tablet (has no administration in time range)    ED Course  I have reviewed the triage vital signs and the nursing notes.  Pertinent labs & imaging results that were available during my care of the patient were reviewed by me and considered in my medical decision making (see chart for details).    MDM Rules/Calculators/A&P                            Joshus Aramburo Landin is a 60 y.o. male with a past medical history significant for previous alcohol abuse, recent diagnosis of congestive heart failure with a 20-25% EF and currently wearing a LifeVest, hypertension, GERD, diverticulosis, hiatal hernia, and hyperlipidemia who presents for near syncope, diaphoresis, lightheadedness, and LifeVest problem.  According to patient, he has had increase in stress over the last few days primarily with his spouse who has continued to drink alcohol and is reportedly currently admitted in the hospital.  He says that last night he had no preceding symptoms but after going to sleep woke up early in the morning at some point with his LifeVest alarming.  He then went back to bed and did not call for help but says this afternoon around 1 PM, it started alarming again.  He reports that during this episode he was very lightheaded felt he was going to pass out, had to sit down, vision started going dark, and he was very sweaty.  He is unsure how long this lasted for but he was able to call for help and get brought in.  He reports his symptoms have resolved at this time.  He denies any chest pain or palpitations that he could feel.  Denies any nausea, vomiting, constipation, diarrhea, or urinary changes.  On exam, lungs are clear and chest is nontender.  He is wearing his LifeVest.  Could not appreciate a murmur.  Abdomen nontender.  Good pulses in extremities.  Vital signs reassuring initially.  EKG does not show STEMI.  We will get screening labs to look for electrolyte imbalance however we will call cardiology for recommendations given this episode.  5:57 PM  Work-up has begun to return.  Initial troponin is negative.  Will be trended.  BNP is not elevated and magnesium is normal.  CBC shows no leukocytosis or anemia but metabolic panel does show elevated sodium and chloride.  Chest x-ray revealed no acute cardiopulmonary abnormality.  Due to the episode of near  syncope, diaphoresis, and LifeVest alarming, I called cardiology who reviewed the case.  They are concerned the patient has 2 episodes of vest alarm and the second 1 being symptomatic they feel he would benefit from likely an ops admission under telemetry monitoring and the cardiology team will be able to see him in the morning to further evaluate what is going on.  We will also try to have the patient activate his LifeVest to send in the findings from monitoring.  Patient will be admitted.   Final Clinical Impression(s) / ED Diagnoses Final diagnoses:  Near syncope  Diaphoresis     Clinical Impression: 1. Near syncope   2. Diaphoresis     Disposition: Admit  This note was prepared with assistance of Dragon voice recognition software. Occasional wrong-word or sound-a-like substitutions may have occurred due to the inherent limitations of voice recognition software.     Josephus Harriger, Gwenyth Allegra, MD 02/01/21 [phone]

## 2021-02-01 DIAGNOSIS — R42 Dizziness and giddiness: Secondary | ICD-10-CM

## 2021-02-01 DIAGNOSIS — I10 Essential (primary) hypertension: Secondary | ICD-10-CM | POA: Diagnosis not present

## 2021-02-01 DIAGNOSIS — Z7289 Other problems related to lifestyle: Secondary | ICD-10-CM | POA: Diagnosis not present

## 2021-02-01 DIAGNOSIS — F1010 Alcohol abuse, uncomplicated: Secondary | ICD-10-CM

## 2021-02-01 DIAGNOSIS — R55 Syncope and collapse: Secondary | ICD-10-CM | POA: Diagnosis not present

## 2021-02-01 DIAGNOSIS — I5042 Chronic combined systolic (congestive) and diastolic (congestive) heart failure: Secondary | ICD-10-CM | POA: Diagnosis not present

## 2021-02-01 LAB — CBC
HCT: 40 % (ref 39.0–52.0)
Hemoglobin: 13.8 g/dL (ref 13.0–17.0)
MCH: 33.2 pg (ref 26.0–34.0)
MCHC: 34.5 g/dL (ref 30.0–36.0)
MCV: 96.2 fL (ref 80.0–100.0)
Platelets: 635 10*3/uL — ABNORMAL HIGH (ref 150–400)
RBC: 4.16 MIL/uL — ABNORMAL LOW (ref 4.22–5.81)
RDW: 15.2 % (ref 11.5–15.5)
WBC: 8.2 10*3/uL (ref 4.0–10.5)
nRBC: 0 % (ref 0.0–0.2)

## 2021-02-01 LAB — COMPREHENSIVE METABOLIC PANEL
ALT: 22 U/L (ref 0–44)
AST: 26 U/L (ref 15–41)
Albumin: 3.6 g/dL (ref 3.5–5.0)
Alkaline Phosphatase: 51 U/L (ref 38–126)
Anion gap: 9 (ref 5–15)
BUN: <5 mg/dL — ABNORMAL LOW (ref 6–20)
CO2: 24 mmol/L (ref 22–32)
Calcium: 9.2 mg/dL (ref 8.9–10.3)
Chloride: 112 mmol/L — ABNORMAL HIGH (ref 98–111)
Creatinine, Ser: 0.76 mg/dL (ref 0.61–1.24)
GFR, Estimated: >60 mL/min (ref >60)
Glucose, Bld: 124 mg/dL — ABNORMAL HIGH (ref 70–99)
Potassium: 3.5 mmol/L (ref 3.5–5.1)
Sodium: 145 mmol/L (ref 135–145)
Total Bilirubin: 0.5 mg/dL (ref 0.3–1.2)
Total Protein: 6.9 g/dL (ref 6.5–8.1)

## 2021-02-01 LAB — BASIC METABOLIC PANEL
Anion gap: 9 (ref 5–15)
BUN: <5 mg/dL — ABNORMAL LOW (ref 6–20)
CO2: 24 mmol/L (ref 22–32)
Calcium: 9.3 mg/dL (ref 8.9–10.3)
Chloride: 111 mmol/L (ref 98–111)
Creatinine, Ser: 0.8 mg/dL (ref 0.61–1.24)
GFR, Estimated: >60 mL/min (ref >60)
Glucose, Bld: 133 mg/dL — ABNORMAL HIGH (ref 70–99)
Potassium: 4.2 mmol/L (ref 3.5–5.1)
Sodium: 144 mmol/L (ref 135–145)

## 2021-02-01 LAB — OSMOLALITY, URINE: Osmolality, Ur: 196 mOsm/kg — ABNORMAL LOW (ref 300–900)

## 2021-02-01 LAB — MAGNESIUM: Magnesium: 1.7 mg/dL (ref 1.7–2.4)

## 2021-02-01 LAB — OSMOLALITY: Osmolality: 297 mOsm/kg — ABNORMAL HIGH (ref 275–295)

## 2021-02-01 LAB — TROPONIN I (HIGH SENSITIVITY): Troponin I (High Sensitivity): 9 ng/L (ref <18)

## 2021-02-01 MED ORDER — METOPROLOL SUCCINATE ER 100 MG PO TB24: 100.0000 mg | ORAL_TABLET | Freq: Every day | ORAL | 3 refills | Status: DC

## 2021-02-01 MED ORDER — SACUBITRIL-VALSARTAN 24-26 MG PO TABS
1.0000 | ORAL_TABLET | Freq: Two times a day (BID) | ORAL | Status: DC
  Administered 2021-02-01: 1 via ORAL
  Filled 2021-02-01: qty 1

## 2021-02-01 MED ORDER — METOPROLOL SUCCINATE ER 100 MG PO TB24
100.0000 mg | ORAL_TABLET | Freq: Every day | ORAL | Status: DC
  Administered 2021-02-01: 100 mg via ORAL
  Filled 2021-02-01: qty 1

## 2021-02-01 MED ORDER — ONDANSETRON HCL 4 MG/2ML IJ SOLN
4.0000 mg | Freq: Three times a day (TID) | INTRAMUSCULAR | Status: DC | PRN
  Administered 2021-02-01: 4 mg via INTRAVENOUS
  Filled 2021-02-01: qty 2

## 2021-02-01 NOTE — TOC Transition Note (Addendum)
Transition of Care [facility]) - CM/SW Discharge Note   Patient Details  Name: Luis Porter MRN: [id number] Date of Birth: [birth year]  [facility] of [facility]) CM/SW Contact:  Ross Ludwig, LCSW Phone Number: 02/01/2021, 11:58 AM   Clinical Narrative:     CSW received consult that patient needs a ride home.  Patient will be discharging back home today via [place] taxi.  Patient Goals and CMS Choice        Discharge Placement  Patient does not have any other needs, dcing back home.                     Discharge Plan and Services                                     Social Determinants of Health (SDOH) Interventions     Readmission Risk Interventions No flowsheet data found.

## 2021-02-01 NOTE — Consult Note (Signed)
Cardiology Consult Note:   Patient ID: Luis Porter MRN: [id number]; DOB: 1961-02-21   Admission date: 01/31/2021  PCP:  Olin Hauser, [facility] Cardiologist:  Fransico Him, MD        Chief Complaint:  Device Alarm  Patient Profile:   Luis Porter is a 60 y.o. male with NICM, HFrEF former heavy alcohol abuse, with alcoholic liver disease, HTN, and former tobacco abuse who is being seen 02/01/2021 for the evaluation of LifeVest alarm.  History of Present Illness:   [name] was recently evaluated for syncope: 01/19/21.  This occurred when he was still drinking.  Had widely patent coronary arteries during this time.  Through his course he went through withdrawal.  Ultimately given his near syncope he had a LifeVest Placed (base rate of 150 bpm) and was discharged 01/21/21.  Patient notes that he is feeling fine.  Has had no chest pain, chest pressure, chest tightness, chest stinging.  To me, patient notes that he was on the phone and heard some distressing news about family then had his LiveVest alarm- he does not remember futher.  Patient exertion notable for doing all ADLs and some gardening but not going back to work: he feels no symptoms.  No shortness of breath, DOE .  No PND or orthopnea.  No weight gain, leg swelling , or abdominal swelling.  No syncope or near syncope . Notes  no palpitations or funny heart beats.   Orthostatic vitals signs were performed this morning: he felt no symptoms.  Patient notes that he has had one dose of his Entresto at home and felt no symptoms (had ACEi allergy to angioedema)  Last Lifevest download was 01/31/21 at 3AM.  During this time, he had mean heart rate of 70s, no heart rates recorded in the 150s and no change in morphology.  Results are being faxed to 1400 nurses station.  In ED, troponin has been in consistent with ACS.  Transaminitis was improved from prior.  Patient is starting work up for his NA  dysregulation.  Cardiology called for evaluation.   Past Medical History:  Diagnosis Date   Chronic systolic heart failure ([place])    felt to be alcohol-related   Diverticulosis    Gastric ulcer    GERD (gastroesophageal reflux disease)    History of hiatal hernia    Hypertension    Iron deficiency anemia 10/18/2019   Recurrent umbilical hernia with incarceration 11/08/2016   Previously repaired x 1    Past Surgical History:  Procedure Laterality Date   BACK SURGERY     COLONOSCOPY WITH PROPOFOL N/A 12/30/2016   Procedure: COLONOSCOPY WITH PROPOFOL;  Surgeon: [name], MD;  Location: [facility] ENDOSCOPY;  Service: Endoscopy;  Laterality: N/A;   COLONOSCOPY WITH PROPOFOL N/A 10/31/2019   Procedure: COLONOSCOPY WITH PROPOFOL;  Surgeon: Lin Landsman, MD;  Location: [facility] ENDOSCOPY;  Service: Gastroenterology;  Laterality: N/A;   ESOPHAGOGASTRODUODENOSCOPY (EGD) WITH PROPOFOL N/A 12/30/2016   Procedure: ESOPHAGOGASTRODUODENOSCOPY (EGD) WITH PROPOFOL;  Surgeon: [name], MD;  Location: [facility] ENDOSCOPY;  Service: Endoscopy;  Laterality: N/A;   ESOPHAGOGASTRODUODENOSCOPY (EGD) WITH PROPOFOL N/A 10/31/2019   Procedure: ESOPHAGOGASTRODUODENOSCOPY (EGD) WITH PROPOFOL;  Surgeon: Lin Landsman, MD;  Location: [facility] ENDOSCOPY;  Service: Gastroenterology;  Laterality: N/A;   HERNIA REPAIR  [id number]   Umbilical Hernia Repair   LUMBAR LAMINECTOMY/DECOMPRESSION MICRODISCECTOMY Right 05/18/2016   Procedure: right L4-5 microdiscectomy;  Surgeon: Blanche East, MD;  Location: [facility] ORS;  Service:  Neurosurgery;  Laterality: Right;   RIGHT/LEFT HEART CATH AND CORONARY ANGIOGRAPHY N/A 01/20/2021   Procedure: RIGHT/LEFT HEART CATH AND CORONARY ANGIOGRAPHY;  Surgeon: Troy Sine, MD;  Location: [facility];  Service: Cardiovascular;  Laterality: N/A;   TONSILLECTOMY     UMBILICAL HERNIA REPAIR N/A 06/29/2018   Procedure: LAPAROSCOPIC REPAIR OF RECURRENT UMBILICAL HERNIA WITH MESH;  Surgeon:  Vickie Epley, MD;  Location: [facility] ORS;  Service: General;  Laterality: N/A;     Medications Prior to Admission: Prior to Admission medications   Medication Sig Start Date End Date Taking? Authorizing Provider  albuterol (VENTOLIN HFA) 108 (90 Base) MCG/ACT inhaler INHALE 1-2 PUFFS INTO THE LUNGS EVERY 4 (FOUR) HOURS AS NEEDED FOR WHEEZING OR SHORTNESS OF BREATH. 04/11/20  Yes Karamalegos, Alexander J, DO  fluticasone (FLONASE) 50 MCG/ACT nasal spray PLACE 2 SPRAYS INTO BOTH NOSTRILS DAILY. USE FOR 4-6 WEEKS THEN STOP AND USE SEASONALLY OR AS NEEDED. Patient taking differently: Place 2 sprays into both nostrils daily as needed for allergies. 07/20/20  Yes Karamalegos, [name], DO  folic acid (FOLVITE) 1 MG tablet Take 1 tablet (1 mg total) by mouth daily. 01/27/21  Yes Karamalegos, [name], DO  hydrOXYzine (ATARAX/VISTARIL) 25 MG tablet Take 1 tablet (25 mg total) by mouth every 6 (six) hours as needed for anxiety (or CIWA score </= 10). 01/21/21  Yes Sheikh, Omair Latif, DO  meclizine (ANTIVERT) 25 MG tablet Take 1 tablet (25 mg total) by mouth 3 (three) times daily as needed for dizziness or nausea. 06/24/20  Yes Karamalegos, [name], DO  metoprolol succinate (TOPROL-XL) 50 MG 24 hr tablet Take 1 tablet (50 mg total) by mouth daily. Take with or immediately following a meal. 01/22/21  Yes Sheikh, Omair Latif, DO  omeprazole (PRILOSEC) 40 MG capsule TAKE 1 CAPSULE BY MOUTH TWICE A DAY Patient taking differently: Take 40 mg by mouth 2 (two) times daily. 12/20/20  Yes Karamalegos, Alexander J, DO  ondansetron (ZOFRAN) 4 MG tablet Take 1 tablet (4 mg total) by mouth every 6 (six) hours as needed for nausea. 01/21/21  Yes Sheikh, Omair Latif, DO  sacubitril-valsartan (ENTRESTO) 24-26 MG Take 1 tablet by mouth 2 (two) times daily. 01/21/21  Yes Sheikh, Omair Latif, DO  spironolactone (ALDACTONE) 25 MG tablet Take 1 tablet (25 mg total) by mouth daily. 01/26/21  Yes Katherine Roan, MD   thiamine 100 MG tablet Take 1 tablet (100 mg total) by mouth daily. 01/27/21  Yes Karamalegos, [name], DO  Vitamins/Minerals TABS Take 1 tablet by mouth daily.   Yes [provider]     Allergies:    Allergies  Allergen Reactions   Benazepril     Angioedema   Naltrexone Other (See Comments)    angry   Nsaids Other (See Comments)    Due to history of ulcers     Social History:   Social History   Socioeconomic History   Marital status: Divorced    Spouse name: Not on file   Number of children: Not on file   Years of education: Not on file   Highest education level: Not on file  Occupational History   Not on file  Tobacco Use   Smoking status: Former    Packs/day: 2.00    Years: 25.00    Pack years: 50.00    Types: Cigarettes    Quit date: 05/31/1987    Years since quitting: 33.6   Smokeless tobacco: Never  Vaping Use  Vaping Use: Never used  Substance and Sexual Activity   Alcohol use: Not Currently    Alcohol/week: 14.0 standard drinks    Types: 14 Cans of beer per week    Comment: former usage. States he drinks around 2 beer a day   Drug use: No   Sexual activity: Yes    Comment: parners birth control  Other Topics Concern   Not on file  Social History Narrative   Not on file   Social Determinants of Health   Financial Resource Strain: Low Risk    Difficulty of Paying Living Expenses: Not very hard  Food Insecurity: No Food Insecurity   Worried About Charity fundraiser in the Last Year: Never true   Ran Out of Food in the Last Year: Never true  Transportation Needs: No Transportation Needs   Lack of Transportation (Medical): No   Lack of Transportation (Non-Medical): No  Physical Activity: Not on file  Stress: No Stress Concern Present   Feeling of Stress : Only a little  Social Connections: Unknown   Frequency of Communication with Friends and Family: Three times a week   Frequency of Social Gatherings with Friends and Family: Three  times a week   Attends Religious Services: 1 to 4 times per year   Active Member of Clubs or Organizations: No   Attends Archivist Meetings: Never   Marital Status: Not on file  Intimate Partner Violence: Not At Risk   Fear of Current or Ex-Partner: No   Emotionally Abused: No   Physically Abused: No   Sexually Abused: No    Family History:   The patient's family history includes Cancer in his father and mother; Cervical cancer in his mother; Hypertension in his brother, father, and mother; Throat cancer in his father. There is no history of Prostate cancer, Breast cancer, Colon cancer, Heart attack, or Stroke.    ROS:  Please see the history of present illness.  All other ROS reviewed and negative.     Physical Exam/Data:   Vitals:   01/31/21 2242 02/01/21 0039 02/01/21 0400 02/01/21 0747  BP:  (!) 148/87 (!) 155/89 (!) 162/81  Pulse:  73 84 86  Resp: '15 14 20 19  '$ Temp: (!) 97.4 F (36.3 C) 98.4 F (36.9 C) 98.2 F (36.8 C) 98.4 F (36.9 C)  TempSrc: Oral Oral Oral Oral  SpO2: 97% 95% 96% 99%   No intake or output data in the 24 hours ending 02/01/21 0750 Last 3 Weights 01/27/2021 01/26/2021 01/18/2021  Weight (lbs) 153 lb 153 lb 146 lb 13.2 oz  Weight (kg) 69.4 kg 69.4 kg 66.6 kg     There is no height or weight on file to calculate BMI.  General:  Well nourished, well developed, in no acute distress HEENT: normal Lymph: no adenopathy Neck: no JVD Endocrine:  No thryomegaly Vascular: No carotid bruits; FA pulses 2+ bilaterally without bruits  Cardiac:  normal S1, S2; RRR; no murmur  Lungs:  clear to auscultation bilaterally, no wheezing, rhonchi or rales  Abd: soft, nontender, no hepatomegaly  Ext: no edema Musculoskeletal:  No deformities, BUE and BLE strength normal and equal Skin: warm and dry  Neuro:  CNs 2-12 intact, no focal abnormalities noted Psych:  Normal affect   Orthstatic Valves:  All SBP 150s all heart rates 80s to 90s  EKG:  The ECG  that was done 01/31/21 was personally reviewed and demonstrates  SR rate 94 borderline anterior infarct pattern,  LVH  Relevant CV Studies:  Transthoracic Echocardiogram: Date: 01/18/21 Results: LVEF 20-25% no dilation  1. Left ventricular ejection fraction, by estimation, is 20 to 25%. The  left ventricle has severely decreased function. The left ventricle  demonstrates global hypokinesis. There is mild left ventricular  hypertrophy. Left ventricular diastolic parameters   are consistent with Grade I diastolic dysfunction (impaired relaxation).   2. Right ventricular systolic function is normal. The right ventricular  size is normal.   3. The mitral valve is normal in structure. No evidence of mitral valve  regurgitation. No evidence of mitral stenosis.   4. The aortic valve was not well visualized. Aortic valve regurgitation  is not visualized. No aortic stenosis is present.    ZOLL Follow up Report Date: 02/01/21 Results: VT rate 150 VF rate 200 VT response time 60 s VF response time 25 s 12 Downloads Wear time 92% Average HR 77% Average Daily Steps 5960 Baseline is SR   Left/Right Heart Catheterizations: Date: 01/20/21 Results: Large RI, dominant RCA; small Lcx - widely patents CORS  Laboratory Data:  High Sensitivity Troponin:   Recent Labs  Lab 01/16/21 1741 01/16/21 1923 01/31/21 1654 01/31/21 2258 02/01/21 0440  TROPONINIHS 30* 28* '10 8 9      '$ Chemistry Recent Labs  Lab 01/31/21 2258 02/01/21 0422  NA 144 145  K 3.5 3.5  CL 108 112*  CO2 25 24  GLUCOSE 97 124*  BUN 5* <5*  CREATININE 0.73 0.76  CALCIUM 8.7* 9.2  GFRNONAA >60 >60  ANIONGAP 11 9    Recent Labs  Lab 01/31/21 1654 02/01/21 0422  PROT 7.2 6.9  ALBUMIN 3.8 3.6  AST 28 26  ALT 23 22  ALKPHOS 53 51  BILITOT 0.3 0.5   Hematology Recent Labs  Lab 01/31/21 1654 02/01/21 0422  WBC 7.1 8.2  RBC 4.39 4.16*  HGB 14.7 13.8  HCT 42.9 40.0  MCV 97.7 96.2  MCH 33.5 33.2  MCHC  34.3 34.5  RDW 15.2 15.2  PLT 657* 635*   BNP Recent Labs  Lab 01/31/21 1655  BNP 31.2    DDimer No results for input(s): DDIMER in the last 168 hours.   UDS positive for Benzos Alcohol level 147  Radiology/Studies:  DG Chest Portable 1 View  Result Date: 01/31/2021 CLINICAL DATA:  Per order: life vest alarm, near syncope with diaphoresis. Pt BIB EMS from home with c/o generalized weakness and dizziness x 1 month. Pt reported he just stopped drinking 2 weeks ago and was diagnosed with CHF EXAM: PORTABLE CHEST - 1 VIEW COMPARISON:  01/16/2021 FINDINGS: Lungs are clear. Heart size and mediastinal contours are within normal limits. No effusion.  No pneumothorax. Visualized bones unremarkable. IMPRESSION: No acute cardiopulmonary disease. Electronically Signed   By: Lucrezia Europe M.D.   On: 01/31/2021 16:15     Assessment and Plan:    Near syncope and lightheadedness - in the setting of acute stressor but with LifeVest Alarm but no firing - DDX includes substance use (alcohol was positive and UDS positive for Benzos) orthostatic hypotension (has had recent positive orthostatic vitals through last admission and if he had compensatory sinus tachycardia, this could have caused the alarm) NSVT (last download shows no change), and AF (none seen prior, hx of alcohol misuse and HF) - continue on telemetry - will increase is metoprolol to 100 mg PO Daily (this would suppress NSVT and if AF would decrease the rate) - we have discussed alcohol and HF  interplay   Heart Failure Reduced Ejection Fraction  - NYHA class I, Stage C, euvolemic, etiology from alcohol in DDX - Diuretic regimen: None- presently euvolemic - Discussed the importance of fluid restriction of < 2 L, salt restriction, and checking daily weights  -  Replace electrolytes PRN and keep K>4 and Mg>2. -  Increased metoprolol to 100 mg PO Daily  - restarting patient's Entresto - aldactone 25 mg PO daily - would price check SGLT2i; if  NA dysregulation improves this would be reasonable addition to GDMT (this would be for outpatient initiation)   Potential DC later today if no ectopy or further issues Has 02/04/21 and 03/03/21 f/u appointements  For questions or updates, please contact [facility] Please consult [email or web address] for contact info under     Signed, Werner Lean, MD  02/01/2021 7:50 AM

## 2021-02-01 NOTE — Progress Notes (Signed)
Patient is nauseated and rates headache pain at 8/10. PCP was notified

## 2021-02-01 NOTE — Plan of Care (Signed)
  Problem: Clinical Measurements: Goal: Respiratory complications will improve Outcome: Completed/Met

## 2021-02-01 NOTE — Discharge Summary (Signed)
Physician Discharge Summary   Luis Porter [id number] DOB: Dec 30, 1960 DOA: 01/31/2021  PCP: Olin Hauser, DO  Admit date: 01/31/2021 Discharge date: 02/01/2021  Admitted From: home Disposition:  home Discharging physician: Dwyane Dee, MD  Recommendations for Outpatient Follow-up:  Follow up with cardiology  Home Health:  Equipment/Devices:   Patient discharged to home in Discharge Condition: stable Risk of unplanned readmission score:    CODE STATUS: Full Diet recommendation:  Diet Orders (From admission, onward)     Start     Ordered   02/01/21 0000  Diet - low sodium heart healthy        02/01/21 1053   01/31/21 1828  Diet Heart Room service appropriate? Yes; Fluid consistency: Thin  Diet effective now       Question Answer Comment  Room service appropriate? Yes   Fluid consistency: Thin      01/31/21 [place] [facility] Course: Theophile Dubow Francis is a 60 y.o. 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   He had also reported an episode of diaphoresis and subjective sensation of feeling that he was going to pass out.  Initially he had reported that he has not consumed any alcohol since his diagnosis of CHF.  However, the following morning after admission he did admit to consuming a rather large amounts of orange flavored Jim Beam just prior to developing his diaphoretic episode.  He was again heavily counseled on alcohol cessation and also being truthful on admission during HPI.  He states he was stressed and upset over his wife being admitted to [facility] the same day.  His EtOH level was 147 on admission.  Furthermore his UDS was also positive for benzos and there are no active prescriptions on the  database nor on his med rec.  This was also addressed and patient did not know how it was in his system and denied taking any medications from friends or meds that were not his.  He stated his only anxiety medicine was the hydroxyzine.  He was evaluated by cardiology after admission.  His LifeVest was considered to be working appropriately.  His episode of drinking was considered to be etiology for his presentation.  His Toprol was increased to 100 mg daily and he was again recommended for alcohol cessation. He was resumed back on his other CHF medications including Entresto and spironolactone.  Due to his reported excessive intake of carbonated Zevia water (12 pack daily), he was also instructed to significantly cut down this intake as it was contributing to his dehydration and hypernatremia on admission. His FeNa was also <1% consistent with pre-renal etiology.  He was again reminded of his water restriction in setting of CHF.  He remained stable on telemetry and was discharged home and will follow up as scheduled with cardiology.  The patient's chronic medical conditions were treated accordingly per the patient's home medication regimen except as noted.  On day of discharge, patient was felt deemed stable for discharge. Patient/family member advised to call PCP or come back to ER if needed.   Principal Diagnosis: Postural dizziness with presyncope  Discharge Diagnoses: Active Hospital Problems   Diagnosis Date Noted   Postural dizziness with presyncope 01/31/2021   Hypernatremia 01/31/2021   Chronic combined  systolic and diastolic heart failure ([place]) 01/31/2021   GERD (gastroesophageal reflux disease) 09/19/2019   Essential hypertension 07/08/2011    Resolved Hospital Problems  No resolved problems to display.    Discharge Instructions     Diet - low sodium heart healthy   Complete by: As directed    Increase activity slowly   Complete by: As directed       Allergies as of  02/01/2021       Reactions   Benazepril    Angioedema   Naltrexone Other (See Comments)   angry   Nsaids Other (See Comments)   Due to history of ulcers        Medication List     STOP taking these medications    meclizine 25 MG tablet Commonly known as: ANTIVERT       TAKE these medications    albuterol 108 (90 Base) MCG/ACT inhaler Commonly known as: VENTOLIN HFA INHALE 1-2 PUFFS INTO THE LUNGS EVERY 4 (FOUR) HOURS AS NEEDED FOR WHEEZING OR SHORTNESS OF BREATH.   fluticasone 50 MCG/ACT nasal spray Commonly known as: FLONASE PLACE 2 SPRAYS INTO BOTH NOSTRILS DAILY. USE FOR 4-6 WEEKS THEN STOP AND USE SEASONALLY OR AS NEEDED. What changed:  when to take this reasons to take this additional instructions   folic acid 1 MG tablet Commonly known as: FOLVITE Take 1 tablet (1 mg total) by mouth daily.   hydrOXYzine 25 MG tablet Commonly known as: ATARAX/VISTARIL Take 1 tablet (25 mg total) by mouth every 6 (six) hours as needed for anxiety (or CIWA score </= 10).   metoprolol succinate 100 MG 24 hr tablet Commonly known as: TOPROL-XL Take 1 tablet (100 mg total) by mouth daily. Take with or immediately following a meal. Start taking on: February 02, 2021 What changed:  medication strength how much to take   omeprazole 40 MG capsule Commonly known as: PRILOSEC TAKE 1 CAPSULE BY MOUTH TWICE A DAY   ondansetron 4 MG tablet Commonly known as: ZOFRAN Take 1 tablet (4 mg total) by mouth every 6 (six) hours as needed for nausea.   sacubitril-valsartan 24-26 MG Commonly known as: ENTRESTO Take 1 tablet by mouth 2 (two) times daily.   spironolactone 25 MG tablet Commonly known as: ALDACTONE Take 1 tablet (25 mg total) by mouth daily.   thiamine 100 MG tablet Take 1 tablet (100 mg total) by mouth daily.   Vitamins/Minerals Tabs Take 1 tablet by mouth daily.        Allergies  Allergen Reactions   Benazepril     Angioedema   Naltrexone Other (See  Comments)    angry   Nsaids Other (See Comments)    Due to history of ulcers     Consultations: Cardiology  Discharge Exam: BP (!) 144/83 (BP Location: Left Arm)   Pulse 74   Temp 98.3 F (36.8 C) (Oral)   Resp 16   Ht '5\' 6"'$  (1.676 m)   Wt 67.7 kg   SpO2 96%   BMI 24.09 kg/m  General appearance: alert, cooperative, and no distress Head: Normocephalic, without obvious abnormality, atraumatic Eyes:  EOMI Lungs: clear to auscultation bilaterally Heart: regular rate and rhythm and S1, S2 normal Abdomen: normal findings: bowel sounds normal and soft, non-tender Extremities:  no edema Skin: mobility and turgor normal Neurologic: Grossly normal  The results of significant diagnostics from this hospitalization (including imaging, microbiology, ancillary and laboratory) are listed below for reference.   Microbiology: Recent Results (from the  past 240 hour(s))  Resp Panel by RT-PCR (Flu A&B, Covid) Nasopharyngeal Swab     Status: None   Collection Time: 01/31/21  6:40 PM   Specimen: Nasopharyngeal Swab; Nasopharyngeal(NP) swabs in vial transport medium  Result Value Ref Range Status   SARS Coronavirus 2 by RT PCR NEGATIVE NEGATIVE Final    Comment: (NOTE) SARS-CoV-2 target nucleic acids are NOT DETECTED.  The SARS-CoV-2 RNA is generally detectable in upper respiratory specimens during the acute phase of infection. The lowest concentration of SARS-CoV-2 viral copies this assay can detect is 138 copies/mL. A negative result does not preclude SARS-Cov-2 infection and should not be used as the sole basis for treatment or other patient management decisions. A negative result may occur with  improper specimen collection/handling, submission of specimen other than nasopharyngeal swab, presence of viral mutation(s) within the areas targeted by this assay, and inadequate number of viral copies(<138 copies/mL). A negative result must be combined with clinical observations, patient  history, and epidemiological information. The expected result is Negative.  Fact Sheet for Patients:  EntrepreneurPulse.com.au  Fact Sheet for Healthcare Providers:  IncredibleEmployment.be  This test is no t yet approved or cleared by the [place] FDA and  has been authorized for detection and/or diagnosis of SARS-CoV-2 by FDA under an Emergency Use Authorization (EUA). This EUA will remain  in effect (meaning this test can be used) for the duration of the COVID-19 declaration under Section 564(b)(1) of the Act, 21 U.S.C.section 360bbb-3(b)(1), unless the authorization is terminated  or revoked sooner.       Influenza A by PCR NEGATIVE NEGATIVE Final   Influenza B by PCR NEGATIVE NEGATIVE Final    Comment: (NOTE) The Xpert Xpress SARS-CoV-2/FLU/RSV plus assay is intended as an aid in the diagnosis of influenza from Nasopharyngeal swab specimens and should not be used as a sole basis for treatment. Nasal washings and aspirates are unacceptable for Xpert Xpress SARS-CoV-2/FLU/RSV testing.  Fact Sheet for Patients: EntrepreneurPulse.com.au  Fact Sheet for Healthcare Providers: IncredibleEmployment.be  This test is not yet approved or cleared by the [place] FDA and has been authorized for detection and/or diagnosis of SARS-CoV-2 by FDA under an Emergency Use Authorization (EUA). This EUA will remain in effect (meaning this test can be used) for the duration of the COVID-19 declaration under Section 564(b)(1) of the Act, 21 U.S.C. section 360bbb-3(b)(1), unless the authorization is terminated or revoked.  Performed at [facility], [place] [address] [place], [place] [address]      Labs: BNP (last 3 results) Recent Labs    01/31/21 1655  BNP [id number]   Basic Metabolic Panel: Recent Labs  Lab 01/31/21 1654 01/31/21 2258 02/01/21 0422 02/01/21 0847  NA 150* 144 145 144   K 3.9 3.5 3.5 4.2  CL 113* 108 112* 111  CO2 '29 25 24 24  '$ GLUCOSE 96 97 124* 133*  BUN <5* 5* <5* <5*  CREATININE 0.66 0.73 0.76 0.80  CALCIUM 8.8* 8.7* 9.2 9.3  MG 2.3  --  1.7  --    Liver Function Tests: Recent Labs  Lab 01/31/21 1654 02/01/21 0422  AST 28 26  ALT 23 22  ALKPHOS 53 51  BILITOT 0.3 0.5  PROT 7.2 6.9  ALBUMIN 3.8 3.6   No results for input(s): LIPASE, AMYLASE in the last 168 hours. No results for input(s): AMMONIA in the last 168 hours. CBC: Recent Labs  Lab 01/31/21 1654 02/01/21 0422  WBC 7.1 8.2  NEUTROABS 2.8  --  HGB 14.7 13.8  HCT 42.9 40.0  MCV 97.7 96.2  PLT 657* 635*   Cardiac Enzymes: No results for input(s): CKTOTAL, CKMB, CKMBINDEX, TROPONINI in the last 168 hours. BNP: Invalid input(s): POCBNP CBG: No results for input(s): GLUCAP in the last 168 hours. D-Dimer No results for input(s): DDIMER in the last 72 hours. Hgb A1c No results for input(s): HGBA1C in the last 72 hours. Lipid Profile No results for input(s): CHOL, HDL, LDLCALC, TRIG, CHOLHDL, LDLDIRECT in the last 72 hours. Thyroid function studies No results for input(s): TSH, T4TOTAL, T3FREE, THYROIDAB in the last 72 hours.  Invalid input(s): FREET3 Anemia work up No results for input(s): VITAMINB12, FOLATE, FERRITIN, TIBC, IRON, RETICCTPCT in the last 72 hours. Urinalysis    Component Value Date/Time   COLORURINE YELLOW (A) 01/31/2021 2140   APPEARANCEUR CLEAR (A) 01/31/2021 2140   LABSPEC <1.005 (L) 01/31/2021 2140   PHURINE 6.0 01/31/2021 2140   GLUCOSEU NEGATIVE 01/31/2021 2140   HGBUR NEGATIVE 01/31/2021 2140   BILIRUBINUR NEGATIVE 01/31/2021 2140   KETONESUR NEGATIVE 01/31/2021 2140   PROTEINUR NEGATIVE 01/31/2021 2140   NITRITE NEGATIVE 01/31/2021 2140   LEUKOCYTESUR NEGATIVE 01/31/2021 2140   Sepsis Labs Invalid input(s): PROCALCITONIN,  WBC,  LACTICIDVEN Microbiology Recent Results (from the past 240 hour(s))  Resp Panel by RT-PCR (Flu A&B, Covid)  Nasopharyngeal Swab     Status: None   Collection Time: 01/31/21  6:40 PM   Specimen: Nasopharyngeal Swab; Nasopharyngeal(NP) swabs in vial transport medium  Result Value Ref Range Status   SARS Coronavirus 2 by RT PCR NEGATIVE NEGATIVE Final    Comment: (NOTE) SARS-CoV-2 target nucleic acids are NOT DETECTED.  The SARS-CoV-2 RNA is generally detectable in upper respiratory specimens during the acute phase of infection. The lowest concentration of SARS-CoV-2 viral copies this assay can detect is 138 copies/mL. A negative result does not preclude SARS-Cov-2 infection and should not be used as the sole basis for treatment or other patient management decisions. A negative result may occur with  improper specimen collection/handling, submission of specimen other than nasopharyngeal swab, presence of viral mutation(s) within the areas targeted by this assay, and inadequate number of viral copies(<138 copies/mL). A negative result must be combined with clinical observations, patient history, and epidemiological information. The expected result is Negative.  Fact Sheet for Patients:  EntrepreneurPulse.com.au  Fact Sheet for Healthcare Providers:  IncredibleEmployment.be  This test is no t yet approved or cleared by the [place] FDA and  has been authorized for detection and/or diagnosis of SARS-CoV-2 by FDA under an Emergency Use Authorization (EUA). This EUA will remain  in effect (meaning this test can be used) for the duration of the COVID-19 declaration under Section 564(b)(1) of the Act, 21 U.S.C.section 360bbb-3(b)(1), unless the authorization is terminated  or revoked sooner.       Influenza A by PCR NEGATIVE NEGATIVE Final   Influenza B by PCR NEGATIVE NEGATIVE Final    Comment: (NOTE) The Xpert Xpress SARS-CoV-2/FLU/RSV plus assay is intended as an aid in the diagnosis of influenza from Nasopharyngeal swab specimens and should not be  used as a sole basis for treatment. Nasal washings and aspirates are unacceptable for Xpert Xpress SARS-CoV-2/FLU/RSV testing.  Fact Sheet for Patients: EntrepreneurPulse.com.au  Fact Sheet for Healthcare Providers: IncredibleEmployment.be  This test is not yet approved or cleared by the [place] FDA and has been authorized for detection and/or diagnosis of SARS-CoV-2 by FDA under an Emergency Use Authorization (EUA). This EUA will remain  in effect (meaning this test can be used) for the duration of the COVID-19 declaration under Section 564(b)(1) of the Act, 21 U.S.C. section 360bbb-3(b)(1), unless the authorization is terminated or revoked.  Performed at [facility], [place] [address] [place], [place] [address]     Procedures/Studies: CARDIAC CATHETERIZATION  Result Date: 01/20/2021 Normal large epicardial coronary arteries with a left main trifurcating into an LAD, large ramus intermediate vessel, and left circumflex vessel.  Normal dominant RCA. Low right heart pressures. In this patient with EF at 20 to 25%, findings are consistent with a nonischemic cardiomyopathy.  With his history of significant EtOH use, consider alcohol etiology. RECOMMENATION: Guideline directed medical therapy for HFrEF as blood pressure allows.  Consider possible arrhythmic etiology to syncope versus hypotension.  He has a ready for Mannam sinus node   DG Chest Portable 1 View  Result Date: 01/31/2021 CLINICAL DATA:  Per order: life vest alarm, near syncope with diaphoresis. Pt BIB EMS from home with c/o generalized weakness and dizziness x 1 month. Pt reported he just stopped drinking 2 weeks ago and was diagnosed with CHF EXAM: PORTABLE CHEST - 1 VIEW COMPARISON:  01/16/2021 FINDINGS: Lungs are clear. Heart size and mediastinal contours are within normal limits. No effusion.  No pneumothorax. Visualized bones unremarkable. IMPRESSION: No acute  cardiopulmonary disease. Electronically Signed   By: Lucrezia Europe M.D.   On: 01/31/2021 16:15   DG Chest Portable 1 View  Result Date: 01/16/2021 CLINICAL DATA:  Per EMS, patient from home, c/o 25 pound weight loss in 1 month with decreased PO intake. Fatigue x 3 days. Reports he consumes 5 drinks daily. One shot and one beer today. Hx of liver disease and anemia. EXAM: PORTABLE CHEST 1 VIEW COMPARISON:  02/17/2020 FINDINGS: Normal cardiac silhouette.  No mediastinal or hilar masses. Clear lungs.  No convincing pleural effusion or pneumothorax. Skeletal structures are grossly intact. IMPRESSION: No active disease. Electronically Signed   By: Lajean Manes M.D.   On: 01/16/2021 15:30   ECHOCARDIOGRAM COMPLETE  Result Date: 01/18/2021    ECHOCARDIOGRAM REPORT   Patient Name:   AMAREE JONE Challis Date of Exam: 01/18/2021 Medical Rec #:  [id number]             Height:       66.0 in Accession #:    [id number]            Weight:       146.8 lb Date of Birth:  01-03-1961             BSA:          1.754 m Patient Age:    60 years              BP:           148/102 mmHg Patient Gender: M                     HR:           127 bpm. Exam Location:  Inpatient Procedure: 2D Echo, Cardiac Doppler and Color Doppler  Results communicated to Dr Darrick Meigs at 16:19 on 01/18/21. Indications:     Syncope  History:         Patient has no prior history of Echocardiogram examinations.                  Risk Factors:Hypertension.  Sonographer:     [place] Referring Phys:  [phone]  Marge Duncans LAMA Diagnosing Phys: Oswaldo Milian MD IMPRESSIONS  1. Left ventricular ejection fraction, by estimation, is 20 to 25%. The left ventricle has severely decreased function. The left ventricle demonstrates global hypokinesis. There is mild left ventricular hypertrophy. Left ventricular diastolic parameters  are consistent with Grade I diastolic dysfunction (impaired relaxation).  2. Right ventricular systolic function is normal. The right  ventricular size is normal.  3. The mitral valve is normal in structure. No evidence of mitral valve regurgitation. No evidence of mitral stenosis.  4. The aortic valve was not well visualized. Aortic valve regurgitation is not visualized. No aortic stenosis is present. FINDINGS  Left Ventricle: Left ventricular ejection fraction, by estimation, is 20 to 25%. The left ventricle has severely decreased function. The left ventricle demonstrates global hypokinesis. The left ventricular internal cavity size was normal in size. There is mild left ventricular hypertrophy. Left ventricular diastolic parameters are consistent with Grade I diastolic dysfunction (impaired relaxation). Right Ventricle: The right ventricular size is normal. No increase in right ventricular wall thickness. Right ventricular systolic function is normal. Left Atrium: Left atrial size was normal in size. Right Atrium: Right atrial size was normal in size. Pericardium: Trivial pericardial effusion is present. Mitral Valve: The mitral valve is normal in structure. No evidence of mitral valve regurgitation. No evidence of mitral valve stenosis. Tricuspid Valve: The tricuspid valve is normal in structure. Tricuspid valve regurgitation is trivial. Aortic Valve: The aortic valve was not well visualized. Aortic valve regurgitation is not visualized. No aortic stenosis is present. Aortic valve mean gradient measures 2.0 mmHg. Aortic valve peak gradient measures 2.8 mmHg. Aortic valve area, by VTI measures 4.22 cm. Pulmonic Valve: The pulmonic valve was grossly normal. Pulmonic valve regurgitation is not visualized. Aorta: The aortic root and ascending aorta are structurally normal, with no evidence of dilitation. IAS/Shunts: The interatrial septum was not well visualized.  LEFT VENTRICLE PLAX 2D LVIDd:         5.20 cm     Diastology LVIDs:         4.40 cm     LV e' medial:    5.55 cm/s LV PW:         1.00 cm     LV E/e' medial:  4.8 LV IVS:        1.00 cm      LV e' lateral:   8.58 cm/s LVOT diam:     2.60 cm     LV E/e' lateral: 3.1 LV SV:         45 LV SV Index:   25 LVOT Area:     5.31 cm  LV Volumes (MOD) LV vol d, MOD A2C: 76.7 ml LV vol d, MOD A4C: 59.7 ml LV vol s, MOD A2C: 59.0 ml LV vol s, MOD A4C: 56.5 ml LV SV MOD A2C:     17.7 ml LV SV MOD A4C:     59.7 ml LV SV MOD BP:      11.8 ml RIGHT VENTRICLE RV S prime:     11.70 cm/s TAPSE (M-mode): 1.7 cm LEFT ATRIUM             Index LA diam:        2.00 cm 1.14 cm/m LA Vol (A2C):   19.0 ml 10.83 ml/m LA Vol (A4C):   11.6 ml 6.61 ml/m LA Biplane Vol: 15.3 ml 8.72 ml/m  AORTIC VALVE  PULMONIC VALVE AV Area (Vmax):    3.99 cm    PV Vmax:       0.73 m/s AV Area (Vmean):   3.96 cm    PV Vmean:      45.800 cm/s AV Area (VTI):     4.22 cm    PV VTI:        0.080 m AV Vmax:           83.60 cm/s  PV Peak grad:  2.1 mmHg AV Vmean:          56.500 cm/s PV Mean grad:  1.0 mmHg AV VTI:            0.106 m AV Peak Grad:      2.8 mmHg AV Mean Grad:      2.0 mmHg LVOT Vmax:         62.90 cm/s LVOT Vmean:        42.100 cm/s LVOT VTI:          0.084 m LVOT/AV VTI ratio: 0.79  AORTA Ao Root diam: 3.20 cm Ao Asc diam:  3.30 cm MITRAL VALVE               TRICUSPID VALVE MV Area (PHT): 8.25 cm    TR Peak grad:   16.5 mmHg MV Decel Time: 92 msec     TR Vmax:        203.00 cm/s MV E velocity: 26.80 cm/s MV A velocity: 64.50 cm/s  SHUNTS MV E/A ratio:  0.42        Systemic VTI:  0.08 m                            Systemic Diam: 2.60 cm Oswaldo Milian MD Electronically signed by Oswaldo Milian MD Signature Date/Time: 01/18/2021/4:20:18 PM    Final (Updated)      Time coordinating discharge: Over 30 minutes    Dwyane Dee, MD  [facility] 02/01/2021, 11:30 AM

## 2021-02-01 NOTE — Progress Notes (Signed)
Pt provided with discharge instructions including changes to medications and follow up appointments. Pt applied life vest prior to discharge. Assunta Gambles contacted for patient transport home.

## 2021-02-02 ENCOUNTER — Other Ambulatory Visit (HOSPITAL_COMMUNITY): Payer: Self-pay

## 2021-02-03 ENCOUNTER — Telehealth: Payer: Self-pay

## 2021-02-03 NOTE — Telephone Encounter (Signed)
I am a little confused by this. I spoke with his [employer] representative for his current Disability case and they did not require any further documentation from me. As this was being handled by his Cardiology office and also GI.  We had this conversation on 01/27/21 during virtual visit with him and then I called [employer] and they told me the paperwork from our office was not required.  So I dont know if something has changed or this is new information.  Nobie Putnam, [facility] 02/03/2021, 3:04 PM

## 2021-02-03 NOTE — Telephone Encounter (Signed)
Copied from [place] [phone]. Topic: General - Other >> Feb 03, 2021 10:01 AM [place] wrote: Reason for CRM: Pt called and says he has had further on-going heart issues and wearing a heart monitor. States that Dr Raliegh Ip will need to do paperwork for the short term FMLA and extend until end of Nov. Pt has not been seen in office since March so he has an appt sch on 9/14 for all the paperwork and discussion of what he needs. Pt states he thought maybe he could just drop everything off and Dr. Raliegh Ip  just fill them out. He was willing to make appt but states if the appt is not necessary to let him know.

## 2021-02-04 ENCOUNTER — Other Ambulatory Visit: Payer: Self-pay

## 2021-02-04 ENCOUNTER — Ambulatory Visit (HOSPITAL_COMMUNITY)
Admission: RE | Admit: 2021-02-04 | Discharge: 2021-02-04 | Disposition: A | Discharge status: Home / Self Care | Payer: Commercial Managed Care - PPO | Source: Ambulatory Visit | Attending: Internal Medicine | Admitting: Internal Medicine

## 2021-02-04 DIAGNOSIS — I420 Dilated cardiomyopathy: Secondary | ICD-10-CM | POA: Insufficient documentation

## 2021-02-04 LAB — BASIC METABOLIC PANEL
Anion gap: 8 (ref 5–15)
BUN: 7 mg/dL (ref 6–20)
CO2: 24 mmol/L (ref 22–32)
Calcium: 8.4 mg/dL — ABNORMAL LOW (ref 8.9–10.3)
Chloride: 106 mmol/L (ref 98–111)
Creatinine, Ser: 0.56 mg/dL — ABNORMAL LOW (ref 0.61–1.24)
GFR, Estimated: >60 mL/min (ref >60)
Glucose, Bld: 99 mg/dL (ref 70–99)
Potassium: 3.8 mmol/L (ref 3.5–5.1)
Sodium: 138 mmol/L (ref 135–145)

## 2021-02-04 NOTE — Telephone Encounter (Signed)
Completed form received back from Dr. Radford Pax, form faxed to [facility] 02/04/21  [facility]

## 2021-02-05 ENCOUNTER — Emergency Department (HOSPITAL_COMMUNITY)
Admission: EM | Admit: 2021-02-05 | Discharge: 2021-02-05 | Disposition: A | Discharge status: Home / Self Care | Payer: Commercial Managed Care - PPO | Attending: Emergency Medicine | Admitting: Emergency Medicine

## 2021-02-05 ENCOUNTER — Emergency Department (HOSPITAL_COMMUNITY): Payer: Commercial Managed Care - PPO

## 2021-02-05 ENCOUNTER — Encounter (HOSPITAL_COMMUNITY): Payer: Self-pay | Admitting: Emergency Medicine

## 2021-02-05 DIAGNOSIS — R4182 Altered mental status, unspecified: Secondary | ICD-10-CM | POA: Insufficient documentation

## 2021-02-05 DIAGNOSIS — I5042 Chronic combined systolic (congestive) and diastolic (congestive) heart failure: Secondary | ICD-10-CM | POA: Diagnosis not present

## 2021-02-05 DIAGNOSIS — R55 Syncope and collapse: Secondary | ICD-10-CM | POA: Insufficient documentation

## 2021-02-05 DIAGNOSIS — R531 Weakness: Secondary | ICD-10-CM | POA: Diagnosis present

## 2021-02-05 DIAGNOSIS — Z87891 Personal history of nicotine dependence: Secondary | ICD-10-CM | POA: Insufficient documentation

## 2021-02-05 DIAGNOSIS — Z79899 Other long term (current) drug therapy: Secondary | ICD-10-CM | POA: Insufficient documentation

## 2021-02-05 DIAGNOSIS — Z8616 Personal history of COVID-19: Secondary | ICD-10-CM | POA: Diagnosis not present

## 2021-02-05 DIAGNOSIS — Y908 Blood alcohol level of 240 mg/100 ml or more: Secondary | ICD-10-CM | POA: Diagnosis not present

## 2021-02-05 DIAGNOSIS — I110 Hypertensive heart disease with heart failure: Secondary | ICD-10-CM | POA: Insufficient documentation

## 2021-02-05 DIAGNOSIS — F10920 Alcohol use, unspecified with intoxication, uncomplicated: Secondary | ICD-10-CM | POA: Diagnosis not present

## 2021-02-05 LAB — CBC WITH DIFFERENTIAL/PLATELET
Abs Immature Granulocytes: 0.04 10*3/uL (ref 0.00–0.07)
Basophils Absolute: 0.2 10*3/uL — ABNORMAL HIGH (ref 0.0–0.1)
Basophils Relative: 2 %
Eosinophils Absolute: 0.3 10*3/uL (ref 0.0–0.5)
Eosinophils Relative: 4 %
HCT: 39.5 % (ref 39.0–52.0)
Hemoglobin: 13.6 g/dL (ref 13.0–17.0)
Immature Granulocytes: 1 %
Lymphocytes Relative: 41 %
Lymphs Abs: 3.5 10*3/uL (ref 0.7–4.0)
MCH: 33.2 pg (ref 26.0–34.0)
MCHC: 34.4 g/dL (ref 30.0–36.0)
MCV: 96.3 fL (ref 80.0–100.0)
Monocytes Absolute: 0.7 10*3/uL (ref 0.1–1.0)
Monocytes Relative: 9 %
Neutro Abs: 3.5 10*3/uL (ref 1.7–7.7)
Neutrophils Relative %: 43 %
Platelets: 438 10*3/uL — ABNORMAL HIGH (ref 150–400)
RBC: 4.1 MIL/uL — ABNORMAL LOW (ref 4.22–5.81)
RDW: 14.6 % (ref 11.5–15.5)
WBC: 8.1 10*3/uL (ref 4.0–10.5)
nRBC: 0 % (ref 0.0–0.2)

## 2021-02-05 LAB — COMPREHENSIVE METABOLIC PANEL
ALT: 19 U/L (ref 0–44)
AST: 25 U/L (ref 15–41)
Albumin: 3.3 g/dL — ABNORMAL LOW (ref 3.5–5.0)
Alkaline Phosphatase: 52 U/L (ref 38–126)
Anion gap: 9 (ref 5–15)
BUN: 6 mg/dL (ref 6–20)
CO2: 27 mmol/L (ref 22–32)
Calcium: 8.4 mg/dL — ABNORMAL LOW (ref 8.9–10.3)
Chloride: 106 mmol/L (ref 98–111)
Creatinine, Ser: 0.75 mg/dL (ref 0.61–1.24)
GFR, Estimated: >60 mL/min (ref >60)
Glucose, Bld: 96 mg/dL (ref 70–99)
Potassium: 3.7 mmol/L (ref 3.5–5.1)
Sodium: 142 mmol/L (ref 135–145)
Total Bilirubin: 0.4 mg/dL (ref 0.3–1.2)
Total Protein: 6.5 g/dL (ref 6.5–8.1)

## 2021-02-05 LAB — BRAIN NATRIURETIC PEPTIDE: B Natriuretic Peptide: 116 pg/mL — ABNORMAL HIGH (ref 0.0–100.0)

## 2021-02-05 LAB — ETHANOL: Alcohol, Ethyl (B): 371 mg/dL (ref <10)

## 2021-02-05 LAB — TROPONIN I (HIGH SENSITIVITY): Troponin I (High Sensitivity): 8 ng/L (ref <18)

## 2021-02-05 IMAGING — DX DG CHEST 1V PORT
1 series · 1 of 1 positions shown · non-contrast
Comparison: 01/31/2021

CLINICAL DATA: Generalized weakness, altered level of consciousness

EXAM:
PORTABLE CHEST 1 VIEW

[chest]
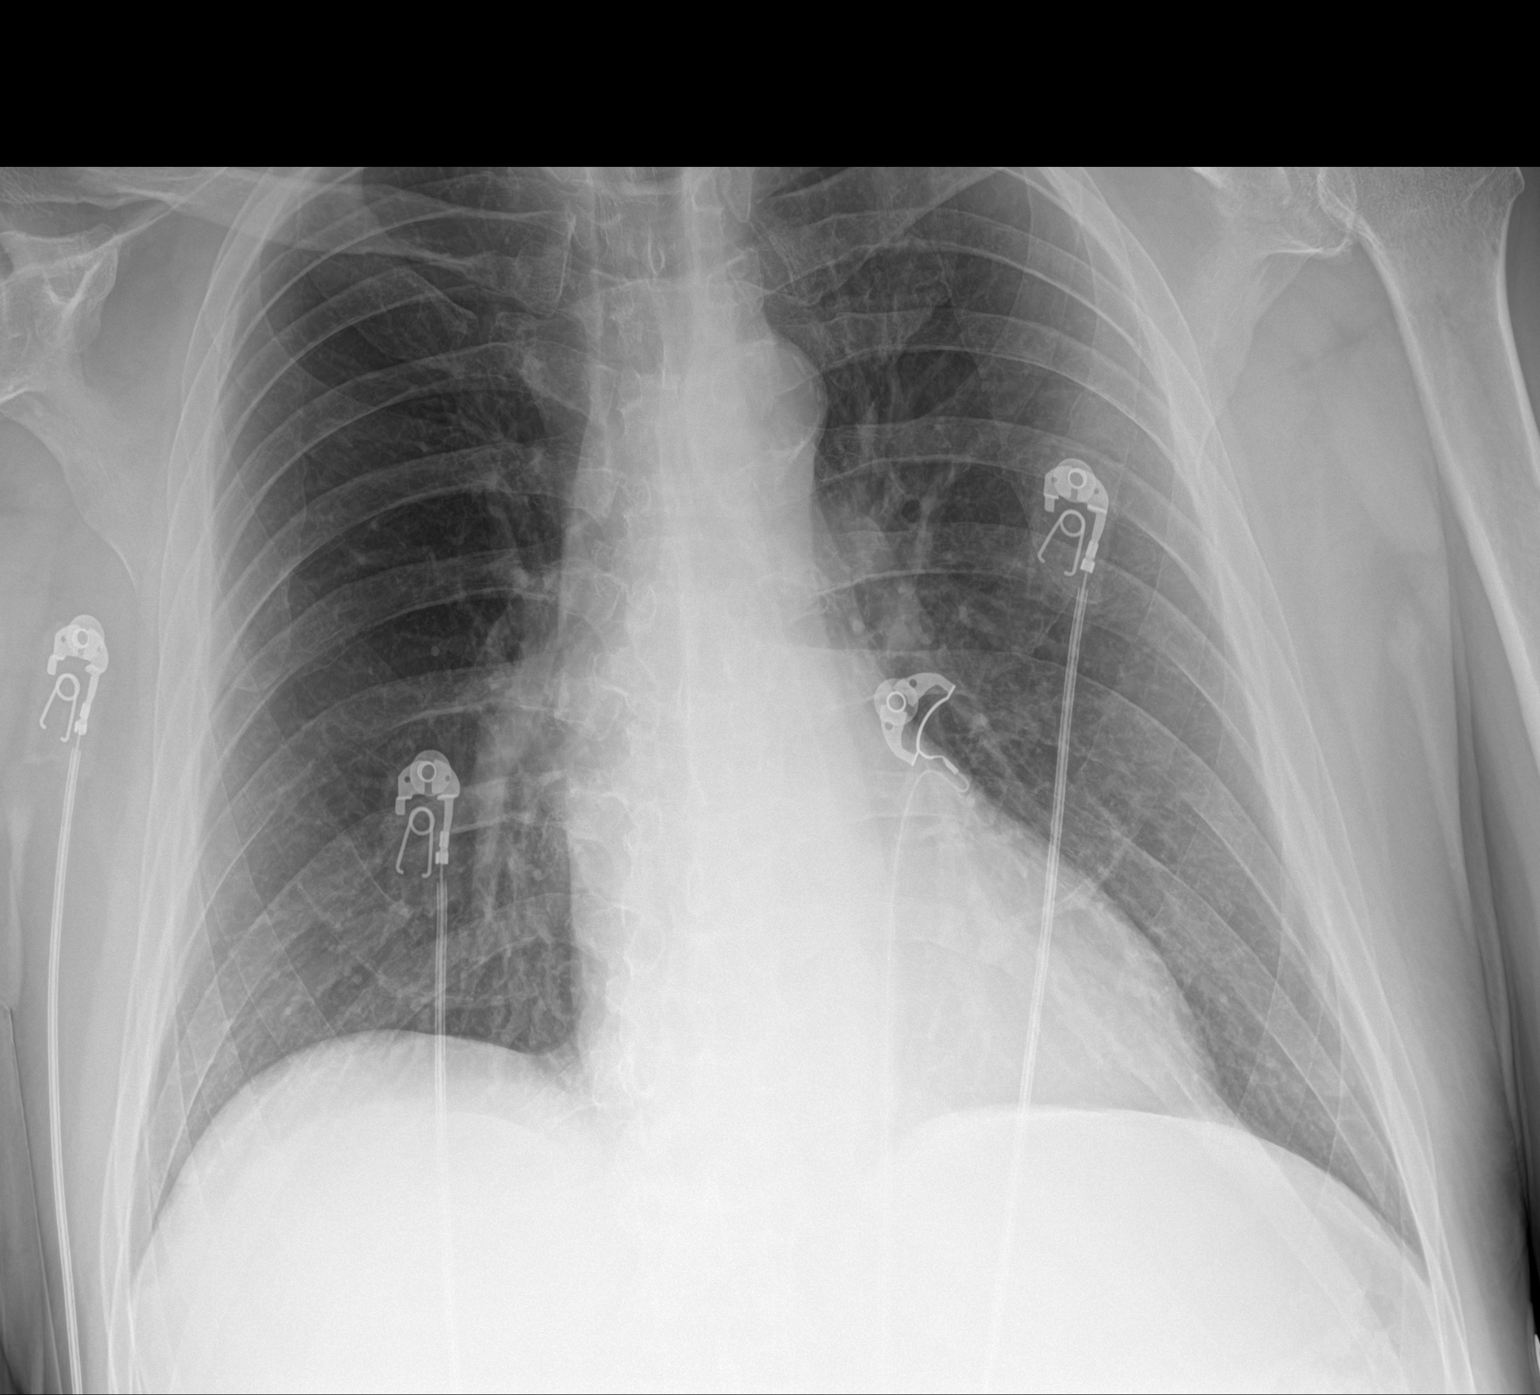

[1 of 1 positions shown; findings below may reference images not displayed]

FINDINGS: Single frontal view of the chest demonstrates an unremarkable
cardiac silhouette. No acute airspace disease, effusion, or
pneumothorax. There are no acute bony abnormalities.
IMPRESSION: 1. No acute intrathoracic process.

## 2021-02-05 MED ORDER — SODIUM CHLORIDE 0.9 % IV BOLUS
500.0000 mL | Freq: Once | INTRAVENOUS | Status: AC
  Administered 2021-02-05: 500 mL via INTRAVENOUS

## 2021-02-05 NOTE — ED Provider Notes (Addendum)
[facility] EMERGENCY DEPARTMENT Provider Note   CSN: [id number] Arrival date & time: 02/05/21  1951     History Chief Complaint  Patient presents with   Gen. Weakness / Altetred LOC /ETOH     Luis Porter is a 60 y.o. male hx of GERD, HTN, alcohol abuse, presenting with weakness and altered mental status.  Patient was just admitted to the hospital for near syncope.  Patient just got out of the hospital 2 days ago.  He states that he did drink alcohol again today.  He states that he tried to sit on the toilet and was unable to get up.  He states that he only drinks 3 beers.  Denies any seizure activity.  Denies any chest pain.  Denies any fall or head injury  The history is provided by the patient.      Past Medical History:  Diagnosis Date   Chronic systolic heart failure ([place])    felt to be alcohol-related   Diverticulosis    Gastric ulcer    GERD (gastroesophageal reflux disease)    History of hiatal hernia    Hypertension    Iron deficiency anemia 10/18/2019   Recurrent umbilical hernia with incarceration 11/08/2016   Previously repaired x 1    Patient Active Problem List   Diagnosis Date Noted   Postural dizziness with presyncope 01/31/2021   Hypernatremia 01/31/2021   Chronic combined systolic and diastolic heart failure ([place]) 01/31/2021   Syncope    Dilated cardiomyopathy ([place])    Elevated troponin 01/16/2021   Alcohol induced fatty liver 08/04/2020   Chronic alcoholic gastritis without hemorrhage 08/04/2020   Alcohol dependence in early full remission ([place]) 08/04/2020   Angioedema 07/28/2020   Alcohol withdrawal syndrome without complication ([place])    [id number] 02/19/2020   Cough 02/19/2020   Abdominal pain, epigastric    GERD (gastroesophageal reflux disease) 09/19/2019   Acute diverticulitis 09/19/2019   Alcohol abuse 09/19/2019   Essential tremor 07/04/2019   Hx of adenomatous colonic polyps 01/06/2017   Herniated lumbar  intervertebral disc 05/18/2016   Mixed hyperlipidemia 05/06/2014   BPH with obstruction/lower urinary tract symptoms 03/31/2014   Insomnia 03/03/2014   Diverticulosis of large intestine without hemorrhage 03/03/2014   Alcohol abuse, daily use 03/03/2014   Essential hypertension 07/08/2011   Hiatal hernia with gastroesophageal reflux 07/07/2011   Lumbago with sciatica 07/07/2011    Past Surgical History:  Procedure Laterality Date   BACK SURGERY     COLONOSCOPY WITH PROPOFOL N/A 12/30/2016   Procedure: COLONOSCOPY WITH PROPOFOL;  Surgeon: [name], MD;  Location: [facility] ENDOSCOPY;  Service: Endoscopy;  Laterality: N/A;   COLONOSCOPY WITH PROPOFOL N/A 10/31/2019   Procedure: COLONOSCOPY WITH PROPOFOL;  Surgeon: Lin Landsman, MD;  Location[facility] ENDOSCOPY;  Service: Gastroenterology;  Laterality: N/A;   ESOPHAGOGASTRODUODENOSCOPY (EGD) WITH PROPOFOL N/A 12/30/2016   Procedure: ESOPHAGOGASTRODUODENOSCOPY (EGD) WITH PROPOFOL;  Surgeon: [name], MD;  Location: [facility] ENDOSCOPY;  Service: Endoscopy;  Laterality: N/A;   ESOPHAGOGASTRODUODENOSCOPY (EGD) WITH PROPOFOL N/A 10/31/2019   Procedure: ESOPHAGOGASTRODUODENOSCOPY (EGD) WITH PROPOFOL;  Surgeon: Lin Landsman, MD;  Location: [facility] ENDOSCOPY;  Service: Gastroenterology;  Laterality: N/A;   HERNIA REPAIR  [id number]   Umbilical Hernia Repair   LUMBAR LAMINECTOMY/DECOMPRESSION MICRODISCECTOMY Right 05/18/2016   Procedure: right L4-5 microdiscectomy;  Surgeon: Blanche East, MD;  Location: [facility] ORS;  Service: Neurosurgery;  Laterality: Right;   RIGHT/LEFT HEART CATH AND CORONARY ANGIOGRAPHY N/A 01/20/2021   Procedure: RIGHT/LEFT HEART CATH AND  CORONARY ANGIOGRAPHY;  Surgeon: Troy Sine, MD;  Location: [facility] CV LAB;  Service: Cardiovascular;  Laterality: N/A;   TONSILLECTOMY     UMBILICAL HERNIA REPAIR N/A 06/29/2018   Procedure: LAPAROSCOPIC REPAIR OF RECURRENT UMBILICAL HERNIA WITH MESH;  Surgeon: Vickie Epley, MD;   Location: [facility] ORS;  Service: General;  Laterality: N/A;       Family History  Problem Relation Age of Onset   Hypertension Mother    Cancer Mother        Ovarian   Cervical cancer Mother    Hypertension Father    Cancer Father        tongue   Throat cancer Father    Hypertension Brother    Prostate cancer Neg Hx    Breast cancer Neg Hx    Colon cancer Neg Hx    Heart attack Neg Hx    Stroke Neg Hx     Social History   Tobacco Use   Smoking status: Former    Packs/day: 2.00    Years: 25.00    Pack years: 50.00    Types: Cigarettes    Quit date: 05/31/1987    Years since quitting: 33.7   Smokeless tobacco: Never  Vaping Use   Vaping Use: Never used  Substance Use Topics   Alcohol use: Not Currently    Alcohol/week: 14.0 standard drinks    Types: 14 Cans of beer per week    Comment: former usage. States he drinks around 2 beer a day   Drug use: No    Home Medications Prior to Admission medications   Medication Sig Start Date End Date Taking? Authorizing Provider  albuterol (VENTOLIN HFA) 108 (90 Base) MCG/ACT inhaler INHALE 1-2 PUFFS INTO THE LUNGS EVERY 4 (FOUR) HOURS AS NEEDED FOR WHEEZING OR SHORTNESS OF BREATH. 04/11/20   Karamalegos, Alexander J, DO  fluticasone (FLONASE) 50 MCG/ACT nasal spray PLACE 2 SPRAYS INTO BOTH NOSTRILS DAILY. USE FOR 4-6 WEEKS THEN STOP AND USE SEASONALLY OR AS NEEDED. Patient taking differently: Place 2 sprays into both nostrils daily as needed for allergies. 07/20/20   Karamalegos, [name], DO  folic acid (FOLVITE) 1 MG tablet Take 1 tablet (1 mg total) by mouth daily. 01/27/21   Karamalegos, [name], DO  hydrOXYzine (ATARAX/VISTARIL) 25 MG tablet Take 1 tablet (25 mg total) by mouth every 6 (six) hours as needed for anxiety (or CIWA score </= 10). 01/21/21   Raiford Noble Latif, DO  metoprolol succinate (TOPROL-XL) 100 MG 24 hr tablet Take 1 tablet (100 mg total) by mouth daily. Take with or immediately following a meal. 02/02/21    Dwyane Dee, MD  omeprazole (PRILOSEC) 40 MG capsule TAKE 1 CAPSULE BY MOUTH TWICE A DAY Patient taking differently: Take 40 mg by mouth 2 (two) times daily. 12/20/20   Karamalegos, [name], DO  ondansetron (ZOFRAN) 4 MG tablet Take 1 tablet (4 mg total) by mouth every 6 (six) hours as needed for nausea. 01/21/21   Sheikh, Omair Latif, DO  sacubitril-valsartan (ENTRESTO) 24-26 MG Take 1 tablet by mouth 2 (two) times daily. 01/21/21   Raiford Noble Latif, DO  spironolactone (ALDACTONE) 25 MG tablet Take 1 tablet (25 mg total) by mouth daily. 01/26/21   Katherine Roan, MD  thiamine 100 MG tablet Take 1 tablet (100 mg total) by mouth daily. 01/27/21   Olin Hauser, DO  Vitamins/Minerals TABS Take 1 tablet by mouth daily.    [provider]  Allergies    Benazepril, Naltrexone, and Nsaids  Review of Systems   Review of Systems  Neurological:  Positive for weakness.  All other systems reviewed and are negative.  Physical Exam Updated Vital Signs Ht '5\' 6"'$  (1.676 m)   Wt 75 kg   BMI 26.69 kg/m   Physical Exam Vitals and nursing note reviewed.  Constitutional:      Comments: Appears slightly intoxicated  HENT:     Head:     Comments: No obvious head injury    Right Ear: Tympanic membrane normal.     Nose: Nose normal.     Mouth/Throat:     Mouth: Mucous membranes are moist.  Eyes:     Extraocular Movements: Extraocular movements intact.     Pupils: Pupils are equal, round, and reactive to light.  Cardiovascular:     Rate and Rhythm: Normal rate and regular rhythm.     Pulses: Normal pulses.     Heart sounds: Normal heart sounds.  Pulmonary:     Effort: Pulmonary effort is normal.     Breath sounds: Normal breath sounds.  Abdominal:     General: Abdomen is flat.  Musculoskeletal:        General: Normal range of motion.     Cervical back: Normal range of motion and neck supple.  Skin:    General: Skin is warm.     Capillary Refill: Capillary  refill takes less than 2 seconds.  Neurological:     Comments: Slightly intoxicated but patient has no facial droop.  Patient has normal strength and sensation.  Patient able to ambulate by himself.  Psychiatric:        Mood and Affect: Mood normal.        Behavior: Behavior normal.    ED Results / Procedures / Treatments   Labs (all labs ordered are listed, but only abnormal results are displayed) Labs Reviewed  CBC WITH DIFFERENTIAL/PLATELET  COMPREHENSIVE METABOLIC PANEL  ETHANOL  BRAIN NATRIURETIC PEPTIDE  TROPONIN I (HIGH SENSITIVITY)    EKG EKG Interpretation  Date/Time:  Friday February 05 2021 19:56:15 EDT Ventricular Rate:  75 PR Interval:  171 QRS Duration: 104 QT Interval:  400 QTC Calculation: 447 R Axis:   38 Text Interpretation: Sinus rhythm Ventricular premature complex Aberrant conduction of SV complex(es) Borderline T wave abnormalities No significant change since last tracing Confirmed by Wandra Arthurs [phone]) on 02/05/2021 8:25:38 PM  Radiology No results found.  Procedures Procedures   Medications Ordered in ED Medications  sodium chloride 0.9 % bolus 500 mL (has no administration in time range)    ED Course  I have reviewed the triage vital signs and the nursing notes.  Pertinent labs & imaging results that were available during my care of the patient were reviewed by me and considered in my medical decision making (see chart for details).    MDM Rules/Calculators/A&P                          Shloime Creese Renier is a 60 y.o. male here presenting with weakness.  Patient states that he was sitting on the commode and could not get up.  He states that he only drank 3 beers.  Patient is a chronic alcoholic.  He has no head injury.  He also has a LifeVest on but it did not shocked him.  He had no chest pain either.  We will get CBC CMP and alcohol  level and troponin.  9:43 PM Alcohol level is 371.  Patient is able to ambulate.  I prefer to observe  him longer but he insisted on going home.  His wife is at home.  Patient is ambulatory and his alcohol level is usually in the 300s, I think she can go home with a cab.     Final Clinical Impression(s) / ED Diagnoses Final diagnoses:  None    Rx / DC Orders ED Discharge Orders     None        Drenda Freeze, MD 02/05/21 2144    Drenda Freeze, MD 02/05/21 2144

## 2021-02-05 NOTE — ED Notes (Signed)
Md notified of elevated ETOH.

## 2021-02-05 NOTE — ED Triage Notes (Signed)
Patient arrived with EMS from home reports generalized weakness , altered LOC and fatigue this week , patient admitted drinking 2 bottles of beer this evening , CBG= 91 . Alert and oriented at arrival . Denies pain /respirations unlabored .

## 2021-02-05 NOTE — Discharge Instructions (Signed)
Please stay hydrated and avoid drinking alcohol.  See your doctor for follow-up  Return to ER if you have weakness, fall, lethargy.

## 2021-02-08 ENCOUNTER — Encounter (HOSPITAL_COMMUNITY): Payer: Self-pay | Admitting: Emergency Medicine

## 2021-02-08 ENCOUNTER — Other Ambulatory Visit: Payer: Self-pay

## 2021-02-08 ENCOUNTER — Other Ambulatory Visit (HOSPITAL_COMMUNITY)
Admission: EM | Admit: 2021-02-08 | Discharge: 2021-02-08 | Disposition: A | Discharge status: Other Acute Inpatient Hospital | Payer: Commercial Managed Care - PPO | Attending: Psychiatry | Admitting: Psychiatry

## 2021-02-08 ENCOUNTER — Emergency Department (HOSPITAL_COMMUNITY)
Admission: EM | Admit: 2021-02-08 | Discharge: 2021-02-09 | Disposition: A | Discharge status: Home / Self Care | Payer: Commercial Managed Care - PPO | Attending: Emergency Medicine | Admitting: Emergency Medicine

## 2021-02-08 DIAGNOSIS — I110 Hypertensive heart disease with heart failure: Secondary | ICD-10-CM | POA: Diagnosis not present

## 2021-02-08 DIAGNOSIS — Z87891 Personal history of nicotine dependence: Secondary | ICD-10-CM | POA: Insufficient documentation

## 2021-02-08 DIAGNOSIS — F1010 Alcohol abuse, uncomplicated: Secondary | ICD-10-CM

## 2021-02-08 DIAGNOSIS — Y908 Blood alcohol level of 240 mg/100 ml or more: Secondary | ICD-10-CM | POA: Diagnosis not present

## 2021-02-08 DIAGNOSIS — F10220 Alcohol dependence with intoxication, uncomplicated: Secondary | ICD-10-CM

## 2021-02-08 DIAGNOSIS — F10229 Alcohol dependence with intoxication, unspecified: Secondary | ICD-10-CM | POA: Diagnosis not present

## 2021-02-08 DIAGNOSIS — I5042 Chronic combined systolic (congestive) and diastolic (congestive) heart failure: Secondary | ICD-10-CM | POA: Insufficient documentation

## 2021-02-08 LAB — COMPREHENSIVE METABOLIC PANEL
ALT: 23 U/L (ref 0–44)
AST: 32 U/L (ref 15–41)
Albumin: 3.8 g/dL (ref 3.5–5.0)
Alkaline Phosphatase: 62 U/L (ref 38–126)
Anion gap: 15 (ref 5–15)
BUN: 7 mg/dL (ref 6–20)
CO2: 24 mmol/L (ref 22–32)
Calcium: 9 mg/dL (ref 8.9–10.3)
Chloride: 104 mmol/L (ref 98–111)
Creatinine, Ser: 0.69 mg/dL (ref 0.61–1.24)
GFR, Estimated: >60 mL/min (ref >60)
Glucose, Bld: 106 mg/dL — ABNORMAL HIGH (ref 70–99)
Potassium: 3.9 mmol/L (ref 3.5–5.1)
Sodium: 143 mmol/L (ref 135–145)
Total Bilirubin: 0.6 mg/dL (ref 0.3–1.2)
Total Protein: 7.6 g/dL (ref 6.5–8.1)

## 2021-02-08 LAB — CBC
HCT: 47.8 % (ref 39.0–52.0)
Hemoglobin: 16.4 g/dL (ref 13.0–17.0)
MCH: 32.7 pg (ref 26.0–34.0)
MCHC: 34.3 g/dL (ref 30.0–36.0)
MCV: 95.4 fL (ref 80.0–100.0)
Platelets: 283 10*3/uL (ref 150–400)
RBC: 5.01 MIL/uL (ref 4.22–5.81)
RDW: 14.6 % (ref 11.5–15.5)
WBC: 6.9 10*3/uL (ref 4.0–10.5)
nRBC: 0 % (ref 0.0–0.2)

## 2021-02-08 LAB — RAPID URINE DRUG SCREEN, HOSP PERFORMED
Amphetamines: NOT DETECTED
Barbiturates: NOT DETECTED
Benzodiazepines: POSITIVE — AB
Cocaine: NOT DETECTED
Opiates: NOT DETECTED
Tetrahydrocannabinol: NOT DETECTED

## 2021-02-08 LAB — ETHANOL: Alcohol, Ethyl (B): 354 mg/dL (ref <10)

## 2021-02-08 MED ORDER — ONDANSETRON 4 MG PO TBDP: 4.0000 mg | ORAL_TABLET | Freq: Four times a day (QID) | ORAL | Status: DC | PRN

## 2021-02-08 MED ORDER — HYDROXYZINE HCL 25 MG PO TABS: 25.0000 mg | ORAL_TABLET | Freq: Three times a day (TID) | ORAL | Status: DC | PRN

## 2021-02-08 MED ORDER — LORAZEPAM 1 MG PO TABS
1.0000 mg | ORAL_TABLET | Freq: Every day | ORAL | Status: DC
Start: 2021-02-12 — End: 2021-02-08

## 2021-02-08 MED ORDER — LORAZEPAM 1 MG PO TABS: 0.0000 mg | ORAL_TABLET | Freq: Two times a day (BID) | ORAL | Status: DC

## 2021-02-08 MED ORDER — THIAMINE HCL 100 MG PO TABS: 100.0000 mg | ORAL_TABLET | Freq: Every day | ORAL | Status: DC

## 2021-02-08 MED ORDER — MAGNESIUM HYDROXIDE 400 MG/5ML PO SUSP: 30.0000 mL | Freq: Every day | ORAL | Status: DC | PRN

## 2021-02-08 MED ORDER — THIAMINE HCL 100 MG/ML IJ SOLN: 100.0000 mg | Freq: Once | INTRAMUSCULAR | Status: DC

## 2021-02-08 MED ORDER — ACETAMINOPHEN 325 MG PO TABS: 650.0000 mg | ORAL_TABLET | Freq: Four times a day (QID) | ORAL | Status: DC | PRN

## 2021-02-08 MED ORDER — LORAZEPAM 1 MG PO TABS: 1.0000 mg | ORAL_TABLET | Freq: Three times a day (TID) | ORAL | Status: DC

## 2021-02-08 MED ORDER — THIAMINE HCL 100 MG/ML IJ SOLN: 100.0000 mg | Freq: Every day | INTRAMUSCULAR | Status: DC

## 2021-02-08 MED ORDER — TRAZODONE HCL 50 MG PO TABS: 50.0000 mg | ORAL_TABLET | Freq: Every evening | ORAL | Status: DC | PRN

## 2021-02-08 MED ORDER — LOPERAMIDE HCL 2 MG PO CAPS: 2.0000 mg | ORAL_CAPSULE | ORAL | Status: DC | PRN

## 2021-02-08 MED ORDER — LORAZEPAM 1 MG PO TABS: 1.0000 mg | ORAL_TABLET | Freq: Four times a day (QID) | ORAL | Status: DC

## 2021-02-08 MED ORDER — LORAZEPAM 1 MG PO TABS: 1.0000 mg | ORAL_TABLET | Freq: Two times a day (BID) | ORAL | Status: DC

## 2021-02-08 MED ORDER — LORAZEPAM 1 MG PO TABS: 0.0000 mg | ORAL_TABLET | Freq: Four times a day (QID) | ORAL | Status: DC

## 2021-02-08 MED ORDER — LORAZEPAM 2 MG/ML IJ SOLN: 0.0000 mg | Freq: Two times a day (BID) | INTRAMUSCULAR | Status: DC

## 2021-02-08 MED ORDER — ADULT MULTIVITAMIN W/MINERALS CH: 1.0000 | ORAL_TABLET | Freq: Every day | ORAL | Status: DC

## 2021-02-08 MED ORDER — ALUM & MAG HYDROXIDE-SIMETH 200-200-20 MG/5ML PO SUSP: 30.0000 mL | ORAL | Status: DC | PRN

## 2021-02-08 MED ORDER — LORAZEPAM 1 MG PO TABS: 1.0000 mg | ORAL_TABLET | Freq: Four times a day (QID) | ORAL | Status: DC | PRN

## 2021-02-08 MED ORDER — LORAZEPAM 2 MG/ML IJ SOLN: 0.0000 mg | Freq: Four times a day (QID) | INTRAMUSCULAR | Status: DC

## 2021-02-08 NOTE — ED Triage Notes (Signed)
Patient here for detox.  Patient was IVC'd by wife.  Patient was at [facility] earlier today.  Patient denies any chest pain.  Patient's last drink was this morning.

## 2021-02-08 NOTE — ED Provider Notes (Signed)
Behavioral Health Urgent Care Medical Screening Exam  Patient Name: Luis Porter MRN: [id number] Date of Evaluation: 02/08/21 Chief Complaint:   Diagnosis:  Final diagnoses:  Alcohol abuse    History of Present illness: Luis Porter is a 60 y.o. male.  Presents to [facility] urgent care accompanied by [facility] Department requesting residential treatment for alcohol abuse.  He denies suicidal or homicidal ideations.  Denies auditory visual hallucinations.  States drinking whiskey and beer daily.  States his last use was 1 hour ago. "  Orange whiskey."   Patient reports he is currently employed and has attempted to get treatment in the past without success.  States his longest sobriety has been for 1 week.  Denies seizure history however reports recently diagnosed with a " heart disorder" due to near syncopal episode. Chart reviewed  patient reports he is prescribed a LifeVest however has not worn this for [place] for the past 2 days.  Patient reports he has attempted to follow-up with [facility], however stated that they have not returned his phone call.   Patient to be transferred to [facility] behavioral health for medical clearance.    Psychiatric Specialty Exam  Presentation  General Appearance:Appropriate for Environment  Eye Contact:Good  Speech:Clear and Coherent  Speech Volume:Decreased  Handedness:Right   Mood and Affect  Mood:Anxious; Depressed  Affect:Depressed   Thought Process  Thought Processes:Coherent  Descriptions of Associations:Intact  Orientation:Full (Time, Place and Person)  Thought Content:Logical    Hallucinations:None  Ideas of Reference:None  Suicidal Thoughts:No  Homicidal Thoughts:No   Sensorium  Memory:Immediate Fair; Recent Fair; Remote Fair  Judgment:Fair  Insight:Fair   Executive Functions  Concentration:Fair  Attention Span:Good  Recall:Good  Fund of  Knowledge:Good  Language:Good   Psychomotor Activity  Psychomotor Activity:Normal   Assets  Assets:Intimacy; Social Support; Resilience   Sleep  Sleep:Fair  Number of hours:  No data recorded  Nutritional Assessment (For OBS and FBC admissions only) Has the patient had a weight loss or gain of 10 pounds or more in the last 3 months?: No Has the patient had a decrease in food intake/or appetite?: No Does the patient have dental problems?: No Does the patient have eating habits or behaviors that may be indicators of an eating disorder including binging or inducing vomiting?: No Has the patient recently lost weight without trying?: 0 Has the patient been eating poorly because of a decreased appetite?: 0 Malnutrition Screening Tool Score: 0   Physical Exam: Physical Exam Vitals and nursing note reviewed.  Neurological:     Mental Status: He is alert and oriented to person, place, and time.  Psychiatric:        Mood and Affect: Mood normal.   ROS Blood pressure (!) 137/101, pulse 90, temperature 98.4 F (36.9 C), resp. rate 18, SpO2 97 %. There is no height or weight on file to calculate BMI.  Musculoskeletal: Strength & Muscle Tone: within normal limits Gait & Station: normal Patient leans: N/A   [facility] MSE Discharge Disposition for Follow up and Recommendations: Based on my evaluation the patient appears to have an emergency medical condition for which I recommend the patient be transferred to the emergency department for further evaluation.  -CSW to follow-up with [facility] request   Derrill [place], NP 02/08/2021, 4:13 PM

## 2021-02-08 NOTE — Progress Notes (Signed)
   02/08/21 1610  [place] (Walk-ins at [facility] only)  How Did You Hear About [place]? Self  What Is the Reason for Your Visit/Call Today? Pt is a 60 yo male presenting brought in by police after his call to them. Pt reported he is seeking detox from alcohol and rehab. Pt reported a hx of a seizure when trying to detox in the past. Pt denies SI, HI, AVH, paranoia and drug use. Pt does not appread to be responding to internal stimuli. Pt reported drinking alcohol daily and drinking liquor most often. Pt was discharged from the hospital last week after medical treatment related to an episode of passing out at home. Pt reported that it was found that he has heart issues that are still under investigation and treatment. No hx of psych diagnoses or treatment.  How Long Has This Been Causing You Problems? > than 6 months  Have You Recently Had Any Thoughts About Hurting Yourself? No  Are You Planning to Commit Suicide/Harm Yourself At This time? No  Have you Recently Had Thoughts About [place]? No  Are You Planning To Harm Someone At This Time? No  Are you currently experiencing any auditory, visual or other hallucinations? No  Have You Used Any Alcohol or Drugs in the Past 24 Hours? Yes  How long ago did you use Drugs or Alcohol? Pt reported drinking alcohol 1 hour prior to coming to [facility]  What Did You Use and How Much? unknown amount  Do you have any current medical co-morbidities that require immediate attention? Yes (Heart issues discovered last week after hospitalization for "passing out")  Clinician description of patient physical appearance/behavior: Pt was casually dressed and adequately groomed. Pt was polite, talkative and cooperative. Pt's speech, movement and thought content were within normal limits. Pt's mood was relaxed with a full range of expression. Pt was oriented x 4.  What Do You Feel Would Help You the Most Today? Alcohol or Drug Use Treatment  If access to [facility]  Urgent Care was not available, would you have sought care in the Emergency Department? Yes  Determination of Need Routine (7 days) (Consulted with Ricky Ala, NP. She recommended pt be transferred to the ED for medical clearance and then, TOC SW to assist in admission or referral to rehab, preferably [facility].)  Options For Referral Chemical Dependency Intensive Outpatient Therapy (CDIOP);Other: Comment (Detox and rehab for alcohol tx)  Wilmon Conover T. Mare Ferrari, [place], [facility], [facility] Triage Specialist [facility]

## 2021-02-08 NOTE — Progress Notes (Addendum)
Received Claron in the assessment room, His Ciwa score is 5. The charge nurse was called and [facility] transported him to [facility] ED. Pt stated he cannot remember taking his medication today, his B/P is elevated.

## 2021-02-08 NOTE — ED Provider Notes (Signed)
[facility] EMERGENCY DEPARTMENT Provider Note   CSN: [id number] Arrival date & time: 02/08/21  1805     History Chief Complaint  Patient presents with   Medical Clearance    Luis Porter is a 60 y.o. male.  Patient to ED from [facility]. He was placed under IVC by wife earlier in the day for alcoholism, citing destructive behavior. He was taken to [facility] for psychiatric evaluation and sent to the ED for medical management, concern for withdrawal. He has had a medical admission in the past for severe alcohol withdrawal. He denies SI, HI, AVH here and during [facility] evaluation, per chart review. He states he is aware he has alcohol dependence and has reached out to [employer] but has not arranged admission yet. No nausea, vomiting, walking difficulty, visual changes.   The history is provided by the patient. No language interpreter was used.      Past Medical History:  Diagnosis Date   Chronic systolic heart failure ([place])    felt to be alcohol-related   Diverticulosis    Gastric ulcer    GERD (gastroesophageal reflux disease)    History of hiatal hernia    Hypertension    Iron deficiency anemia 10/18/2019   Recurrent umbilical hernia with incarceration 11/08/2016   Previously repaired x 1    Patient Active Problem List   Diagnosis Date Noted   Postural dizziness with presyncope 01/31/2021   Hypernatremia 01/31/2021   Chronic combined systolic and diastolic heart failure ([place]) 01/31/2021   Syncope    Dilated cardiomyopathy ([place])    Elevated troponin 01/16/2021   Alcohol induced fatty liver 08/04/2020   Chronic alcoholic gastritis without hemorrhage 08/04/2020   Alcohol dependence in early full remission ([place]) 08/04/2020   Angioedema 07/28/2020   Alcohol withdrawal syndrome without complication ([place])    [id number] 02/19/2020   Cough 02/19/2020   Abdominal pain, epigastric    GERD (gastroesophageal reflux disease) 09/19/2019   Acute diverticulitis  09/19/2019   Alcohol abuse 09/19/2019   Essential tremor 07/04/2019   Hx of adenomatous colonic polyps 01/06/2017   Herniated lumbar intervertebral disc 05/18/2016   Mixed hyperlipidemia 05/06/2014   BPH with obstruction/lower urinary tract symptoms 03/31/2014   Insomnia 03/03/2014   Diverticulosis of large intestine without hemorrhage 03/03/2014   Alcohol abuse, daily use 03/03/2014   Essential hypertension 07/08/2011   Hiatal hernia with gastroesophageal reflux 07/07/2011   Lumbago with sciatica 07/07/2011    Past Surgical History:  Procedure Laterality Date   BACK SURGERY     COLONOSCOPY WITH PROPOFOL N/A 12/30/2016   Procedure: COLONOSCOPY WITH PROPOFOL;  Surgeon: [name], MD;  Location: [facility] ENDOSCOPY;  Service: Endoscopy;  Laterality: N/A;   COLONOSCOPY WITH PROPOFOL N/A 10/31/2019   Procedure: COLONOSCOPY WITH PROPOFOL;  Surgeon: Lin Landsman, MD;  Location: [facility] ENDOSCOPY;  Service: Gastroenterology;  Laterality: N/A;   ESOPHAGOGASTRODUODENOSCOPY (EGD) WITH PROPOFOL N/A 12/30/2016   Procedure: ESOPHAGOGASTRODUODENOSCOPY (EGD) WITH PROPOFOL;  Surgeon: [name], MD;  Location: [facility] ENDOSCOPY;  Service: Endoscopy;  Laterality: N/A;   ESOPHAGOGASTRODUODENOSCOPY (EGD) WITH PROPOFOL N/A 10/31/2019   Procedure: ESOPHAGOGASTRODUODENOSCOPY (EGD) WITH PROPOFOL;  Surgeon: Lin Landsman, MD;  Location: [facility] ENDOSCOPY;  Service: Gastroenterology;  Laterality: N/A;   HERNIA REPAIR  [id number]   Umbilical Hernia Repair   LUMBAR LAMINECTOMY/DECOMPRESSION MICRODISCECTOMY Right 05/18/2016   Procedure: right L4-5 microdiscectomy;  Surgeon: Blanche East, MD;  Location: [facility] ORS;  Service: Neurosurgery;  Laterality: Right;   RIGHT/LEFT HEART CATH AND CORONARY ANGIOGRAPHY  N/A 01/20/2021   Procedure: RIGHT/LEFT HEART CATH AND CORONARY ANGIOGRAPHY;  Surgeon: Troy Sine, MD;  Location: [facility];  Service: Cardiovascular;  Laterality: N/A;   TONSILLECTOMY     UMBILICAL  HERNIA REPAIR N/A 06/29/2018   Procedure: LAPAROSCOPIC REPAIR OF RECURRENT UMBILICAL HERNIA WITH MESH;  Surgeon: Vickie Epley, MD;  Location: [facility] ORS;  Service: General;  Laterality: N/A;       Family History  Problem Relation Age of Onset   Hypertension Mother    Cancer Mother        Ovarian   Cervical cancer Mother    Hypertension Father    Cancer Father        tongue   Throat cancer Father    Hypertension Brother    Prostate cancer Neg Hx    Breast cancer Neg Hx    Colon cancer Neg Hx    Heart attack Neg Hx    Stroke Neg Hx     Social History   Tobacco Use   Smoking status: Former    Packs/day: 2.00    Years: 25.00    Pack years: 50.00    Types: Cigarettes    Quit date: 05/31/1987    Years since quitting: 33.7   Smokeless tobacco: Never  Vaping Use   Vaping Use: Never used  Substance Use Topics   Alcohol use: Not Currently    Alcohol/week: 14.0 standard drinks    Types: 14 Cans of beer per week    Comment: former usage. States he drinks around 2 beer a day   Drug use: No    Home Medications Prior to Admission medications   Medication Sig Start Date End Date Taking? Authorizing Provider  albuterol (VENTOLIN HFA) 108 (90 Base) MCG/ACT inhaler INHALE 1-2 PUFFS INTO THE LUNGS EVERY 4 (FOUR) HOURS AS NEEDED FOR WHEEZING OR SHORTNESS OF BREATH. 04/11/20   Karamalegos, Alexander J, DO  fluticasone (FLONASE) 50 MCG/ACT nasal spray PLACE 2 SPRAYS INTO BOTH NOSTRILS DAILY. USE FOR 4-6 WEEKS THEN STOP AND USE SEASONALLY OR AS NEEDED. Patient taking differently: Place 2 sprays into both nostrils daily as needed for allergies. 07/20/20   Karamalegos, [name], DO  folic acid (FOLVITE) 1 MG tablet Take 1 tablet (1 mg total) by mouth daily. 01/27/21   Karamalegos, [name], DO  hydrOXYzine (ATARAX/VISTARIL) 25 MG tablet Take 1 tablet (25 mg total) by mouth every 6 (six) hours as needed for anxiety (or CIWA score </= 10). 01/21/21   Raiford Noble Latif, DO  metoprolol  succinate (TOPROL-XL) 100 MG 24 hr tablet Take 1 tablet (100 mg total) by mouth daily. Take with or immediately following a meal. 02/02/21   Dwyane Dee, MD  omeprazole (PRILOSEC) 40 MG capsule TAKE 1 CAPSULE BY MOUTH TWICE A DAY Patient taking differently: Take 40 mg by mouth 2 (two) times daily. 12/20/20   Karamalegos, [name], DO  ondansetron (ZOFRAN) 4 MG tablet Take 1 tablet (4 mg total) by mouth every 6 (six) hours as needed for nausea. 01/21/21   Sheikh, Omair Latif, DO  sacubitril-valsartan (ENTRESTO) 24-26 MG Take 1 tablet by mouth 2 (two) times daily. 01/21/21   Raiford Noble Latif, DO  spironolactone (ALDACTONE) 25 MG tablet Take 1 tablet (25 mg total) by mouth daily. 01/26/21   Katherine Roan, MD  thiamine 100 MG tablet Take 1 tablet (100 mg total) by mouth daily. 01/27/21   Olin Hauser, DO  Vitamins/Minerals TABS Take 1 tablet by mouth  daily.    [provider]    Allergies    Benazepril, Naltrexone, and Nsaids  Review of Systems   Review of Systems  Constitutional:  Negative for chills and fever.  HENT: Negative.    Respiratory: Negative.    Cardiovascular: Negative.   Gastrointestinal: Negative.   Musculoskeletal: Negative.   Skin: Negative.   Neurological: Negative.   Psychiatric/Behavioral:  Negative for dysphoric mood, hallucinations, self-injury and suicidal ideas. The patient is not nervous/anxious.    Physical Exam Updated Vital Signs BP (!) 151/88 (BP Location: Right Arm)   Pulse (!) 114   Temp 98.4 F (36.9 C) (Oral)   Resp 16   Ht '5\' 6"'$  (1.676 m)   Wt 63.5 kg   SpO2 96%   BMI 22.60 kg/m   Physical Exam Vitals and nursing note reviewed.  Constitutional:      Appearance: Normal appearance. He is well-developed.  HENT:     Head: Normocephalic.  Cardiovascular:     Rate and Rhythm: Normal rate and regular rhythm.     Heart sounds: No murmur heard. Pulmonary:     Effort: Pulmonary effort is normal.     Breath sounds: Normal  breath sounds. No wheezing, rhonchi or rales.  Abdominal:     General: Bowel sounds are normal.     Palpations: Abdomen is soft.     Tenderness: There is no abdominal tenderness. There is no guarding or rebound.  Musculoskeletal:        General: Normal range of motion.     Cervical back: Normal range of motion and neck supple.  Skin:    General: Skin is warm and dry.     Coloration: Skin is not jaundiced.  Neurological:     General: No focal deficit present.     Mental Status: He is alert and oriented to person, place, and time.     Sensory: No sensory deficit.     Coordination: Coordination normal.     Gait: Gait normal.     Comments: No tremor. Clear and focused speech. No asterixis.    ED Results / Procedures / Treatments   Labs (all labs ordered are listed, but only abnormal results are displayed) Labs Reviewed  COMPREHENSIVE METABOLIC PANEL - Abnormal; Notable for the following components:      Result Value   Glucose, Bld 106 (*)    All other components within normal limits  ETHANOL - Abnormal; Notable for the following components:   Alcohol, Ethyl (B) 354 (*)    All other components within normal limits  RAPID URINE DRUG SCREEN, HOSP PERFORMED - Abnormal; Notable for the following components:   Benzodiazepines POSITIVE (*)    All other components within normal limits  CBC    EKG None  Radiology No results found.  Procedures Procedures   Medications Ordered in ED Medications  LORazepam (ATIVAN) injection 0-4 mg (has no administration in time range)    Or  LORazepam (ATIVAN) tablet 0-4 mg (has no administration in time range)  LORazepam (ATIVAN) injection 0-4 mg (has no administration in time range)    Or  LORazepam (ATIVAN) tablet 0-4 mg (has no administration in time range)  thiamine tablet 100 mg (has no administration in time range)    Or  thiamine (B-1) injection 100 mg (has no administration in time range)    ED Course  I have reviewed the triage  vital signs and the nursing notes.  Pertinent labs & imaging results that were available  during my care of the patient were reviewed by me and considered in my medical decision making (see chart for details).    MDM Rules/Calculators/A&P                           Patient to ED from [facility] having been placed under IVC, sent to ED for medical management. SW was to help coordinate with [facility] for possible admission.   The patient is cooperative, polite, oriented. He denies SI/HI/AVH. He shows no signs of alcohol withdrawal. He wants to be discharged.   Alcohol level 354 (taken 2 hours ago). Given he is coherent, awake, alert, rational, I feel this demonstrates severe alcohol dependence with daily use. He is, however, felt able to decide his disposition. IVC is rescinded as there is no psychiatric red flags - no SI/HI/AVH. He is aware he is at risk for withdrawal and is advised he is welcome to return to the ED at any time he needs medical assistance. Will have TOC reach out to him regarding facilitating admission for alcohol treatment.    Final Clinical Impression(s) / ED Diagnoses Final diagnoses:  None   Alcohol dependence  Rx / DC Orders ED Discharge Orders     None        Dennie Bible 02/08/21 2334    Elnora Morrison, MD 02/11/21 1112

## 2021-02-08 NOTE — ED Notes (Signed)
NA for triage

## 2021-02-08 NOTE — Discharge Instructions (Addendum)
You can be discharged home. The Involuntary Commitment was rescinded.   A social worker will be reaching out to you to provide assistance/information regarding alcohol rehab.   Please return to the ED anytime you feel you need medical assistance with alcohol withdrawal/illness, or for psychiatric support at any time.

## 2021-02-08 NOTE — Discharge Instructions (Addendum)
Take all medications as prescribed. Keep all follow-up appointments as scheduled.  Do not consume alcohol or use illegal drugs while on prescription medications. Report any adverse effects from your medications to your primary care provider promptly.  In the event of recurrent symptoms or worsening symptoms, call 911, a crisis hotline, or go to the nearest emergency department for evaluation.   

## 2021-02-09 ENCOUNTER — Other Ambulatory Visit (HOSPITAL_COMMUNITY): Payer: Self-pay

## 2021-02-09 NOTE — Telephone Encounter (Signed)
Will discuss at pts upcoming appt.

## 2021-02-09 NOTE — Telephone Encounter (Signed)
Advanced Heart Failure Patient Advocate Encounter  Prior Authorization for Delene Loll has been approved.    PA# [id number] Effective dates: 02/02/2021 through 02/03/2024  Patients co-pay is $0  Charlann Boxer, CPhT

## 2021-02-10 ENCOUNTER — Ambulatory Visit (INDEPENDENT_AMBULATORY_CARE_PROVIDER_SITE_OTHER): Payer: Commercial Managed Care - PPO | Admitting: Family Medicine

## 2021-02-10 ENCOUNTER — Encounter: Payer: Self-pay | Admitting: Family Medicine

## 2021-02-10 ENCOUNTER — Encounter (HOSPITAL_COMMUNITY): Payer: Self-pay | Admitting: Radiology

## 2021-02-10 ENCOUNTER — Observation Stay (HOSPITAL_COMMUNITY)
Admission: EM | Admit: 2021-02-10 | Discharge: 2021-02-11 | Disposition: A | Discharge status: Home / Self Care | Payer: Commercial Managed Care - PPO | Attending: Family Medicine | Admitting: Family Medicine

## 2021-02-10 ENCOUNTER — Other Ambulatory Visit: Payer: Self-pay

## 2021-02-10 ENCOUNTER — Emergency Department
Admission: EM | Admit: 2021-02-10 | Discharge: 2021-02-10 | Discharge status: Against Medical Advice | Payer: Commercial Managed Care - PPO | Attending: Emergency Medicine | Admitting: Emergency Medicine

## 2021-02-10 VITALS — BP 144/109 | HR 122

## 2021-02-10 DIAGNOSIS — J45909 Unspecified asthma, uncomplicated: Secondary | ICD-10-CM | POA: Diagnosis not present

## 2021-02-10 DIAGNOSIS — E782 Mixed hyperlipidemia: Secondary | ICD-10-CM

## 2021-02-10 DIAGNOSIS — F10939 Alcohol use, unspecified with withdrawal, unspecified: Secondary | ICD-10-CM

## 2021-02-10 DIAGNOSIS — I10 Essential (primary) hypertension: Secondary | ICD-10-CM | POA: Insufficient documentation

## 2021-02-10 DIAGNOSIS — F10239 Alcohol dependence with withdrawal, unspecified: Secondary | ICD-10-CM

## 2021-02-10 DIAGNOSIS — Z79899 Other long term (current) drug therapy: Secondary | ICD-10-CM | POA: Diagnosis not present

## 2021-02-10 DIAGNOSIS — Z20822 Contact with and (suspected) exposure to covid-19: Secondary | ICD-10-CM | POA: Diagnosis not present

## 2021-02-10 DIAGNOSIS — F1010 Alcohol abuse, uncomplicated: Secondary | ICD-10-CM | POA: Diagnosis present

## 2021-02-10 DIAGNOSIS — Z8616 Personal history of COVID-19: Secondary | ICD-10-CM | POA: Insufficient documentation

## 2021-02-10 DIAGNOSIS — Z87891 Personal history of nicotine dependence: Secondary | ICD-10-CM | POA: Diagnosis not present

## 2021-02-10 DIAGNOSIS — G250 Essential tremor: Secondary | ICD-10-CM | POA: Diagnosis not present

## 2021-02-10 DIAGNOSIS — F10931 Alcohol use, unspecified with withdrawal delirium: Secondary | ICD-10-CM | POA: Insufficient documentation

## 2021-02-10 DIAGNOSIS — Y908 Blood alcohol level of 240 mg/100 ml or more: Secondary | ICD-10-CM | POA: Diagnosis not present

## 2021-02-10 DIAGNOSIS — Y900 Blood alcohol level of less than 20 mg/100 ml: Secondary | ICD-10-CM | POA: Diagnosis not present

## 2021-02-10 DIAGNOSIS — F10230 Alcohol dependence with withdrawal, uncomplicated: Secondary | ICD-10-CM | POA: Diagnosis not present

## 2021-02-10 DIAGNOSIS — I5042 Chronic combined systolic (congestive) and diastolic (congestive) heart failure: Secondary | ICD-10-CM | POA: Diagnosis not present

## 2021-02-10 DIAGNOSIS — R251 Tremor, unspecified: Secondary | ICD-10-CM | POA: Diagnosis present

## 2021-02-10 DIAGNOSIS — F10231 Alcohol dependence with withdrawal delirium: Secondary | ICD-10-CM | POA: Insufficient documentation

## 2021-02-10 DIAGNOSIS — I110 Hypertensive heart disease with heart failure: Secondary | ICD-10-CM | POA: Insufficient documentation

## 2021-02-10 DIAGNOSIS — F10930 Alcohol use, unspecified with withdrawal, uncomplicated: Secondary | ICD-10-CM

## 2021-02-10 DIAGNOSIS — K219 Gastro-esophageal reflux disease without esophagitis: Secondary | ICD-10-CM

## 2021-02-10 LAB — COMPREHENSIVE METABOLIC PANEL
ALT: 24 U/L (ref 0–44)
ALT: 24 U/L (ref 0–44)
AST: 43 U/L — ABNORMAL HIGH (ref 15–41)
AST: 43 U/L — ABNORMAL HIGH (ref 15–41)
Albumin: 4.1 g/dL (ref 3.5–5.0)
Albumin: 4.2 g/dL (ref 3.5–5.0)
Alkaline Phosphatase: 67 U/L (ref 38–126)
Alkaline Phosphatase: 68 U/L (ref 38–126)
Anion gap: 13 (ref 5–15)
Anion gap: 13 (ref 5–15)
BUN: 10 mg/dL (ref 6–20)
BUN: 10 mg/dL (ref 6–20)
CO2: 23 mmol/L (ref 22–32)
CO2: 22 mmol/L (ref 22–32)
Calcium: 8.5 mg/dL — ABNORMAL LOW (ref 8.9–10.3)
Calcium: 8.3 mg/dL — ABNORMAL LOW (ref 8.9–10.3)
Chloride: 103 mmol/L (ref 98–111)
Chloride: 106 mmol/L (ref 98–111)
Creatinine, Ser: 0.64 mg/dL (ref 0.61–1.24)
Creatinine, Ser: 0.64 mg/dL (ref 0.61–1.24)
GFR, Estimated: >60 mL/min (ref >60)
GFR, Estimated: >60 mL/min (ref >60)
Glucose, Bld: 122 mg/dL — ABNORMAL HIGH (ref 70–99)
Glucose, Bld: 111 mg/dL — ABNORMAL HIGH (ref 70–99)
Potassium: 3.9 mmol/L (ref 3.5–5.1)
Potassium: 3.9 mmol/L (ref 3.5–5.1)
Sodium: 139 mmol/L (ref 135–145)
Sodium: 141 mmol/L (ref 135–145)
Total Bilirubin: 1 mg/dL (ref 0.3–1.2)
Total Bilirubin: 0.6 mg/dL (ref 0.3–1.2)
Total Protein: 7.7 g/dL (ref 6.5–8.1)
Total Protein: 7.7 g/dL (ref 6.5–8.1)

## 2021-02-10 LAB — CBC WITH DIFFERENTIAL/PLATELET
Abs Immature Granulocytes: 0.02 10*3/uL (ref 0.00–0.07)
Basophils Absolute: 0.1 10*3/uL (ref 0.0–0.1)
Basophils Relative: 1 %
Eosinophils Absolute: 0 10*3/uL (ref 0.0–0.5)
Eosinophils Relative: 0 %
HCT: 43.7 % (ref 39.0–52.0)
Hemoglobin: 15.4 g/dL (ref 13.0–17.0)
Immature Granulocytes: 0 %
Lymphocytes Relative: 22 %
Lymphs Abs: 1.9 10*3/uL (ref 0.7–4.0)
MCH: 33.4 pg (ref 26.0–34.0)
MCHC: 35.2 g/dL (ref 30.0–36.0)
MCV: 94.8 fL (ref 80.0–100.0)
Monocytes Absolute: 0.3 10*3/uL (ref 0.1–1.0)
Monocytes Relative: 3 %
Neutro Abs: 6.4 10*3/uL (ref 1.7–7.7)
Neutrophils Relative %: 74 %
Platelets: 181 10*3/uL (ref 150–400)
RBC: 4.61 MIL/uL (ref 4.22–5.81)
RDW: 14.6 % (ref 11.5–15.5)
WBC: 8.6 10*3/uL (ref 4.0–10.5)
nRBC: 0 % (ref 0.0–0.2)

## 2021-02-10 LAB — RESP PANEL BY RT-PCR (FLU A&B, COVID) ARPGX2
Influenza A by PCR: NEGATIVE
Influenza B by PCR: NEGATIVE
SARS Coronavirus 2 by RT PCR: NEGATIVE

## 2021-02-10 LAB — URINALYSIS, ROUTINE W REFLEX MICROSCOPIC
Bilirubin Urine: NEGATIVE
Glucose, UA: NEGATIVE mg/dL
Ketones, ur: 15 mg/dL — AB
Leukocytes,Ua: NEGATIVE
Nitrite: NEGATIVE
Protein, ur: 30 mg/dL — AB
Specific Gravity, Urine: 1.02 (ref 1.005–1.030)
pH: 6 (ref 5.0–8.0)

## 2021-02-10 LAB — PROTIME-INR
INR: 1 (ref 0.8–1.2)
Prothrombin Time: 12.9 seconds (ref 11.4–15.2)

## 2021-02-10 LAB — URINALYSIS, MICROSCOPIC (REFLEX)
Bacteria, UA: NONE SEEN
Squamous Epithelial / HPF: NONE SEEN (ref 0–5)
WBC, UA: NONE SEEN WBC/hpf (ref 0–5)

## 2021-02-10 LAB — CBC
HCT: 45.5 % (ref 39.0–52.0)
Hemoglobin: 16.5 g/dL (ref 13.0–17.0)
MCH: 33.7 pg (ref 26.0–34.0)
MCHC: 36.3 g/dL — ABNORMAL HIGH (ref 30.0–36.0)
MCV: 93 fL (ref 80.0–100.0)
Platelets: 210 10*3/uL (ref 150–400)
RBC: 4.89 MIL/uL (ref 4.22–5.81)
RDW: 14.3 % (ref 11.5–15.5)
WBC: 5.8 10*3/uL (ref 4.0–10.5)
nRBC: 0 % (ref 0.0–0.2)

## 2021-02-10 LAB — LIPASE, BLOOD: Lipase: 43 U/L (ref 11–51)

## 2021-02-10 LAB — SALICYLATE LEVEL: Salicylate Lvl: <7 mg/dL — ABNORMAL LOW (ref 7.0–30.0)

## 2021-02-10 LAB — ETHANOL
Alcohol, Ethyl (B): 267 mg/dL — ABNORMAL HIGH (ref <10)
Alcohol, Ethyl (B): 176 mg/dL — ABNORMAL HIGH (ref <10)

## 2021-02-10 LAB — ACETAMINOPHEN LEVEL: Acetaminophen (Tylenol), Serum: <10 ug/mL — ABNORMAL LOW (ref 10–30)

## 2021-02-10 MED ORDER — THIAMINE HCL 100 MG PO TABS
100.0000 mg | ORAL_TABLET | Freq: Every day | ORAL | Status: DC
  Filled 2021-02-10: qty 1

## 2021-02-10 MED ORDER — SODIUM CHLORIDE 0.9 % IV SOLN
1.0000 mg | Freq: Once | INTRAVENOUS | Status: DC
  Filled 2021-02-10: qty 0.2

## 2021-02-10 MED ORDER — THIAMINE HCL 100 MG/ML IJ SOLN: 100.0000 mg | Freq: Every day | INTRAMUSCULAR | Status: DC

## 2021-02-10 MED ORDER — SODIUM CHLORIDE 0.9 % IV BOLUS
1000.0000 mL | Freq: Once | INTRAVENOUS | Status: AC
  Administered 2021-02-10: 1000 mL via INTRAVENOUS

## 2021-02-10 MED ORDER — LORAZEPAM 1 MG PO TABS: 0.0000 mg | ORAL_TABLET | Freq: Two times a day (BID) | ORAL | Status: DC

## 2021-02-10 MED ORDER — LORAZEPAM 2 MG/ML IJ SOLN
0.0000 mg | Freq: Four times a day (QID) | INTRAMUSCULAR | Status: DC
  Administered 2021-02-10 – 2021-02-11 (×3): 1 mg via INTRAVENOUS
  Filled 2021-02-10 (×3): qty 1

## 2021-02-10 MED ORDER — LORAZEPAM 1 MG PO TABS: 0.0000 mg | ORAL_TABLET | Freq: Four times a day (QID) | ORAL | Status: DC

## 2021-02-10 MED ORDER — SODIUM CHLORIDE 0.9 % IV SOLN: INTRAVENOUS | Status: DC

## 2021-02-10 MED ORDER — THIAMINE HCL 100 MG/ML IJ SOLN
100.0000 mg | Freq: Every day | INTRAMUSCULAR | Status: DC
  Administered 2021-02-10 – 2021-02-11 (×2): 100 mg via INTRAVENOUS
  Filled 2021-02-10 (×2): qty 2

## 2021-02-10 MED ORDER — SODIUM CHLORIDE 0.9 % IV BOLUS: 1000.0000 mL | Freq: Once | INTRAVENOUS | Status: DC

## 2021-02-10 MED ORDER — LORAZEPAM 2 MG/ML IJ SOLN: 0.0000 mg | Freq: Two times a day (BID) | INTRAMUSCULAR | Status: DC

## 2021-02-10 NOTE — ED Provider Notes (Signed)
[facility] DEPT Provider Note   CSN: [id number] Arrival date & time: 02/10/21  2032     History Chief Complaint  Patient presents with   Detox    Luis Porter is a 60 y.o. male.  Patient with a history of significant alcohol abuse.  Patient states he has had tremors and aches in his joints.  He was seen by his primary care doctor in the [place] region this morning.  And sent into [facility] ED.  Patient was evaluated by them but he left.  He went home and drank more.  His blood alcohol level there was in the 200 range.  Patient is back is feeling as if he is detoxing he is shaky he is got tremors and is achy.  No nausea or vomiting.  No abdominal pain.  Past medical history otherwise significant for chronic systolic heart failure gastroesophageal reflux disease hypertension.      Past Medical History:  Diagnosis Date   Chronic systolic heart failure ([place])    felt to be alcohol-related   Diverticulosis    Gastric ulcer    GERD (gastroesophageal reflux disease)    History of hiatal hernia    Hypertension    Iron deficiency anemia 10/18/2019   Recurrent umbilical hernia with incarceration 11/08/2016   Previously repaired x 1    Patient Active Problem List   Diagnosis Date Noted   Postural dizziness with presyncope 01/31/2021   Hypernatremia 01/31/2021   Chronic combined systolic and diastolic heart failure ([place]) 01/31/2021   Syncope    Dilated cardiomyopathy ([place])    Elevated troponin 01/16/2021   Alcohol induced fatty liver 08/04/2020   Chronic alcoholic gastritis without hemorrhage 08/04/2020   Alcohol dependence in early full remission ([place]) 08/04/2020   Angioedema 07/28/2020   Alcohol withdrawal syndrome without complication ([place])    [id number] 02/19/2020   Cough 02/19/2020   Abdominal pain, epigastric    GERD (gastroesophageal reflux disease) 09/19/2019   Acute diverticulitis 09/19/2019   Alcohol abuse 09/19/2019   Essential  tremor 07/04/2019   Hx of adenomatous colonic polyps 01/06/2017   Herniated lumbar intervertebral disc 05/18/2016   Mixed hyperlipidemia 05/06/2014   BPH with obstruction/lower urinary tract symptoms 03/31/2014   Insomnia 03/03/2014   Diverticulosis of large intestine without hemorrhage 03/03/2014   Alcohol abuse, daily use 03/03/2014   Essential hypertension 07/08/2011   Hiatal hernia with gastroesophageal reflux 07/07/2011   Lumbago with sciatica 07/07/2011    Past Surgical History:  Procedure Laterality Date   BACK SURGERY     COLONOSCOPY WITH PROPOFOL N/A 12/30/2016   Procedure: COLONOSCOPY WITH PROPOFOL;  Surgeon: [name], MD;  Location: [facility] ENDOSCOPY;  Service: Endoscopy;  Laterality: N/A;   COLONOSCOPY WITH PROPOFOL N/A 10/31/2019   Procedure: COLONOSCOPY WITH PROPOFOL;  Surgeon: Lin Landsman, MD;  Location: [facility] ENDOSCOPY;  Service: Gastroenterology;  Laterality: N/A;   ESOPHAGOGASTRODUODENOSCOPY (EGD) WITH PROPOFOL N/A 12/30/2016   Procedure: ESOPHAGOGASTRODUODENOSCOPY (EGD) WITH PROPOFOL;  Surgeon: [name], MD;  Location: [facility] ENDOSCOPY;  Service: Endoscopy;  Laterality: N/A;   ESOPHAGOGASTRODUODENOSCOPY (EGD) WITH PROPOFOL N/A 10/31/2019   Procedure: ESOPHAGOGASTRODUODENOSCOPY (EGD) WITH PROPOFOL;  Surgeon: Lin Landsman, MD;  Location: [facility] ENDOSCOPY;  Service: Gastroenterology;  Laterality: N/A;   HERNIA REPAIR  [id number]   Umbilical Hernia Repair   LUMBAR LAMINECTOMY/DECOMPRESSION MICRODISCECTOMY Right 05/18/2016   Procedure: right L4-5 microdiscectomy;  Surgeon: Blanche East, MD;  Location: [facility] ORS;  Service: Neurosurgery;  Laterality: Right;   RIGHT/LEFT HEART CATH AND  CORONARY ANGIOGRAPHY N/A 01/20/2021   Procedure: RIGHT/LEFT HEART CATH AND CORONARY ANGIOGRAPHY;  Surgeon: Troy Sine, MD;  Location: [facility] CV LAB;  Service: Cardiovascular;  Laterality: N/A;   TONSILLECTOMY     UMBILICAL HERNIA REPAIR N/A 06/29/2018   Procedure: LAPAROSCOPIC  REPAIR OF RECURRENT UMBILICAL HERNIA WITH MESH;  Surgeon: Vickie Epley, MD;  Location: [facility] ORS;  Service: General;  Laterality: N/A;       Family History  Problem Relation Age of Onset   Hypertension Mother    Cancer Mother        Ovarian   Cervical cancer Mother    Hypertension Father    Cancer Father        tongue   Throat cancer Father    Hypertension Brother    Prostate cancer Neg Hx    Breast cancer Neg Hx    Colon cancer Neg Hx    Heart attack Neg Hx    Stroke Neg Hx     Social History   Tobacco Use   Smoking status: Former    Packs/day: 2.00    Years: 25.00    Pack years: 50.00    Types: Cigarettes    Quit date: 05/31/1987    Years since quitting: 33.7   Smokeless tobacco: Never  Vaping Use   Vaping Use: Never used  Substance Use Topics   Alcohol use: Yes    Alcohol/week: 14.0 standard drinks    Types: 14 Cans of beer per week    Comment: former usage. States he drinks around 2 beer a day   Drug use: No    Home Medications Prior to Admission medications   Medication Sig Start Date End Date Taking? Authorizing Provider  albuterol (VENTOLIN HFA) 108 (90 Base) MCG/ACT inhaler INHALE 1-2 PUFFS INTO THE LUNGS EVERY 4 (FOUR) HOURS AS NEEDED FOR WHEEZING OR SHORTNESS OF BREATH. 04/11/20  Yes Karamalegos, Alexander J, DO  fluticasone (FLONASE) 50 MCG/ACT nasal spray PLACE 2 SPRAYS INTO BOTH NOSTRILS DAILY. USE FOR 4-6 WEEKS THEN STOP AND USE SEASONALLY OR AS NEEDED. Patient taking differently: Place 2 sprays into both nostrils daily as needed for allergies. 07/20/20  Yes Karamalegos, [name], DO  folic acid (FOLVITE) 1 MG tablet Take 1 tablet (1 mg total) by mouth daily. 01/27/21  Yes Karamalegos, [name], DO  hydrOXYzine (ATARAX/VISTARIL) 25 MG tablet Take 1 tablet (25 mg total) by mouth every 6 (six) hours as needed for anxiety (or CIWA score </= 10). 01/21/21  Yes Sheikh, Omair Latif, DO  loratadine (CLARITIN) 10 MG tablet Take 10 mg by mouth daily as  needed for allergies or rhinitis.   Yes [provider]  meclizine (ANTIVERT) 25 MG tablet Take 25 mg by mouth 3 (three) times daily as needed for dizziness.   Yes [provider]  metoprolol succinate (TOPROL-XL) 100 MG 24 hr tablet Take 1 tablet (100 mg total) by mouth daily. Take with or immediately following a meal. 02/02/21  Yes Dwyane Dee, MD  MIDOL COMPLETE 500-60-15 MG TABS Take 2 tablets by mouth 3 (three) times a week.   Yes [provider]  multivitamin (ONE-A-DAY MEN'S) TABS tablet Take 1 tablet by mouth daily with breakfast.   Yes [provider]  omeprazole (PRILOSEC) 40 MG capsule TAKE 1 CAPSULE BY MOUTH TWICE A DAY Patient taking differently: Take 40 mg by mouth 2 (two) times daily. 12/20/20  Yes Karamalegos, [name], DO  ondansetron (ZOFRAN-ODT) 4 MG disintegrating tablet Take 4  mg by mouth every 8 (eight) hours as needed for nausea or vomiting (dissolve orally).   Yes [provider]  sacubitril-valsartan (ENTRESTO) 24-26 MG Take 1 tablet by mouth 2 (two) times daily. 01/21/21  Yes Sheikh, Omair Latif, DO  spironolactone (ALDACTONE) 25 MG tablet Take 1 tablet (25 mg total) by mouth daily. 01/26/21  Yes Katherine Roan, MD  thiamine 100 MG tablet Take 1 tablet (100 mg total) by mouth daily. 01/27/21  Yes Karamalegos, Alexander J, DO  ondansetron (ZOFRAN) 4 MG tablet Take 1 tablet (4 mg total) by mouth every 6 (six) hours as needed for nausea. Patient not taking: Reported on 02/10/2021 01/21/21   Raiford Noble Latif, DO    Allergies    Benazepril, Naltrexone, and Nsaids  Review of Systems   Review of Systems  Constitutional:  Negative for chills and fever.  HENT:  Negative for ear pain and sore throat.   Eyes:  Negative for pain and visual disturbance.  Respiratory:  Negative for cough and shortness of breath.   Cardiovascular:  Negative for chest pain and palpitations.  Gastrointestinal:  Negative for abdominal pain and  vomiting.  Genitourinary:  Negative for dysuria and hematuria.  Musculoskeletal:  Positive for arthralgias. Negative for back pain.  Skin:  Negative for color change and rash.  Neurological:  Positive for tremors. Negative for seizures and syncope.  All other systems reviewed and are negative.  Physical Exam Updated Vital Signs BP 120/86   Pulse (!) 107   Temp 97.8 F (36.6 C) (Oral)   Resp 15   Ht 1.676 m ('5\' 6"'$ )   Wt 64 kg   SpO2 94%   BMI 22.76 kg/m   Physical Exam Vitals and nursing note reviewed.  Constitutional:      Appearance: He is well-developed. He is ill-appearing.  HENT:     Head: Normocephalic and atraumatic.  Eyes:     Extraocular Movements: Extraocular movements intact.     Conjunctiva/sclera: Conjunctivae normal.     Pupils: Pupils are equal, round, and reactive to light.  Cardiovascular:     Rate and Rhythm: Regular rhythm. Tachycardia present.     Heart sounds: No murmur heard. Pulmonary:     Effort: Pulmonary effort is normal. No respiratory distress.     Breath sounds: Normal breath sounds.  Abdominal:     Palpations: Abdomen is soft.     Tenderness: There is no abdominal tenderness.  Musculoskeletal:        General: No swelling. Normal range of motion.     Cervical back: Normal range of motion and neck supple.     Comments: No joint swelling no erythema.  Skin:    General: Skin is warm and dry.     Capillary Refill: Capillary refill takes less than 2 seconds.  Neurological:     Mental Status: He is alert and oriented to person, place, and time.     Cranial Nerves: No cranial nerve deficit.     Sensory: No sensory deficit.     Motor: No weakness.     Comments: Does have tremors.  And patient is shaky.    ED Results / Procedures / Treatments   Labs (all labs ordered are listed, but only abnormal results are displayed) Labs Reviewed  COMPREHENSIVE METABOLIC PANEL - Abnormal; Notable for the following components:      Result Value    Glucose, Bld 111 (*)    Calcium 8.3 (*)    AST 43 (*)  All other components within normal limits  ETHANOL - Abnormal; Notable for the following components:   Alcohol, Ethyl (B) 176 (*)    All other components within normal limits  RESP PANEL BY RT-PCR (FLU A&B, COVID) [id number]  CBC WITH DIFFERENTIAL/PLATELET  LIPASE, BLOOD    EKG None  Radiology No results found.  Procedures Procedures   Medications Ordered in ED Medications  0.9 %  sodium chloride infusion ( Intravenous New Bag/Given 02/10/21 2151)  LORazepam (ATIVAN) injection 0-4 mg (1 mg Intravenous Given 02/10/21 2156)    Or  LORazepam (ATIVAN) tablet 0-4 mg ( Oral See Alternative 02/10/21 2156)  LORazepam (ATIVAN) injection 0-4 mg (has no administration in time range)    Or  LORazepam (ATIVAN) tablet 0-4 mg (has no administration in time range)  thiamine tablet 100 mg ( Oral See Alternative 02/10/21 2146)    Or  thiamine (B-1) injection 100 mg (100 mg Intravenous Given 02/10/21 2146)  sodium chloride 0.9 % bolus 1,000 mL (1,000 mLs Intravenous New Bag/Given 02/10/21 2145)    ED Course  I have reviewed the triage vital signs and the nursing notes.  Pertinent labs & imaging results that were available during my care of the patient were reviewed by me and considered in my medical decision making (see chart for details).    MDM Rules/Calculators/A&P                         CRITICAL CARE Performed by: Fredia Sorrow Total critical care time: 40 minutes Critical care time was exclusive of separately billable procedures and treating other patients. Critical care was necessary to treat or prevent imminent or life-threatening deterioration. Critical care was time spent personally by me on the following activities: development of treatment plan with patient and/or surrogate as well as nursing, discussions with consultants, evaluation of patient's response to treatment, examination of patient, obtaining history from patient or  surrogate, ordering and performing treatments and interventions, ordering and review of laboratory studies, ordering and review of radiographic studies, pulse oximetry and re-evaluation of patient's condition.   Patient peers to be going through alcohol withdrawal.  Mostly shakes and tremors and tachycardia.  Patient started on CIWA protocol.  Patient's labs including liver function test without significant abnormalities.  Lipase is normal as well.  No leukocytosis.  Alcohol level here 176.  Flu and COVID testing is negative.  Feel patient will be better served being admitted started the [place] protocol.  And patient given IV fluids.  With the first dose of Ativan patient showing some signs of improvement however he is still tachycardic and he is still having tremors.  Will discuss with hospitalist for admission Final Clinical Impression(s) / ED Diagnoses Final diagnoses:  Alcohol withdrawal syndrome without complication [facility])    Rx / DC Orders ED Discharge Orders     None        Fredia Sorrow, MD 02/10/21 2321

## 2021-02-10 NOTE — Patient Instructions (Signed)
° °  Please schedule a Follow-up Appointment to: No follow-ups on file. ° °If you have any other questions or concerns, please feel free to call the office or send a message through MyChart. You may also schedule an earlier appointment if necessary. ° °Additionally, you may be receiving a survey about your experience at our office within a few days to 1 week by e-mail or mail. We value your feedback. ° °Michaiah Holsopple, DO °[facility] °

## 2021-02-10 NOTE — ED Triage Notes (Signed)
Pt to ED [facility] from [place] [facility] for ETOH withdrawal. C/o nausea, joint pain, headache.  D/c from hospital yesterday.  Last ETOH this am . Slight tremor noted

## 2021-02-10 NOTE — ED Notes (Signed)
Pt stating that he is tired of waiting, pt informed that his fluid orders and medication orders have been entered, pt states that he can drink water at home. Pt also informed that a consult has been placed for him, pt states that he was supposed to have a consult 2 days ago. Pt states "thank you for your time." Pt observed grabbing his things and walking towards the lobby

## 2021-02-10 NOTE — ED Triage Notes (Signed)
Pt from home after being at [facility] for the better part of the day trying to detox. Pt was placed in a hallway bed and didn't like it so he left and went home and drank more. Pt states he normally drinks 18 cans of beer a day but has only had 4 today. Pt feels he is detoxing as he is shaking and achy.

## 2021-02-10 NOTE — ED Notes (Signed)
Pt states that he was recently discharged from [facility] and treated for diverticulitis. Pt was also experiencing abnormal heart rythms and had to wear a heart monitor for that., pt states that the plan was to medically detox from alcohol there and be transferred to a rehab center in [place] but the patient was under the impression that the facility was full and didn't have any availability at that time, the pt states that his wife also has an alcohol problem and went to a treatment center in [place] this am, pt is visibly shaking while talking with this rn, pt states that he had a regular appointment with his family dr this am who recommended that he come to the ER for further treatment

## 2021-02-10 NOTE — ED Provider Notes (Signed)
[facility] Emergency Department Provider Note   ____________________________________________   I have reviewed the triage vital signs and the nursing notes.   HISTORY  Chief Complaint detox   History limited by: Not Limited   HPI Luis Porter is a 60 y.o. male who presents to the emergency department today at the advice of primary care physician because of concerns for alcohol withdrawal.  Patient states that he has a long history of alcohol abuse.  He states today he has started feeling at some pain in his joints.  Additionally has felt somewhat weak.  He went to his primary care doctor today for a scheduled appointment and when there the daughter was concerned that he was going through alcohol withdrawal.   Records reviewed. Per medical record review patient has a history of heart failure, alcohol abuse. Recent ER visit 2 days ago for alcohol concerns.   Past Medical History:  Diagnosis Date   Chronic systolic heart failure ([place])    felt to be alcohol-related   Diverticulosis    Gastric ulcer    GERD (gastroesophageal reflux disease)    History of hiatal hernia    Hypertension    Iron deficiency anemia 10/18/2019   Recurrent umbilical hernia with incarceration 11/08/2016   Previously repaired x 1    Patient Active Problem List   Diagnosis Date Noted   Postural dizziness with presyncope 01/31/2021   Hypernatremia 01/31/2021   Chronic combined systolic and diastolic heart failure ([place]) 01/31/2021   Syncope    Dilated cardiomyopathy ([place])    Elevated troponin 01/16/2021   Alcohol induced fatty liver 08/04/2020   Chronic alcoholic gastritis without hemorrhage 08/04/2020   Alcohol dependence in early full remission ([place]) 08/04/2020   Angioedema 07/28/2020   Alcohol withdrawal syndrome without complication ([place])    [id number] 02/19/2020   Cough 02/19/2020   Abdominal pain, epigastric    GERD (gastroesophageal reflux disease) 09/19/2019    Acute diverticulitis 09/19/2019   Alcohol abuse 09/19/2019   Essential tremor 07/04/2019   Hx of adenomatous colonic polyps 01/06/2017   Herniated lumbar intervertebral disc 05/18/2016   Mixed hyperlipidemia 05/06/2014   BPH with obstruction/lower urinary tract symptoms 03/31/2014   Insomnia 03/03/2014   Diverticulosis of large intestine without hemorrhage 03/03/2014   Alcohol abuse, daily use 03/03/2014   Essential hypertension 07/08/2011   Hiatal hernia with gastroesophageal reflux 07/07/2011   Lumbago with sciatica 07/07/2011    Past Surgical History:  Procedure Laterality Date   BACK SURGERY     COLONOSCOPY WITH PROPOFOL N/A 12/30/2016   Procedure: COLONOSCOPY WITH PROPOFOL;  Surgeon: [name], MD;  Location[facility] ENDOSCOPY;  Service: Endoscopy;  Laterality: N/A;   COLONOSCOPY WITH PROPOFOL N/A 10/31/2019   Procedure: COLONOSCOPY WITH PROPOFOL;  Surgeon: Lin Landsman, MD;  Location: [facility] ENDOSCOPY;  Service: Gastroenterology;  Laterality: N/A;   ESOPHAGOGASTRODUODENOSCOPY (EGD) WITH PROPOFOL N/A 12/30/2016   Procedure: ESOPHAGOGASTRODUODENOSCOPY (EGD) WITH PROPOFOL;  Surgeon: [name], MD;  Location: [facility] ENDOSCOPY;  Service: Endoscopy;  Laterality: N/A;   ESOPHAGOGASTRODUODENOSCOPY (EGD) WITH PROPOFOL N/A 10/31/2019   Procedure: ESOPHAGOGASTRODUODENOSCOPY (EGD) WITH PROPOFOL;  Surgeon: Lin Landsman, MD;  Location: [facility] ENDOSCOPY;  Service: Gastroenterology;  Laterality: N/A;   HERNIA REPAIR  [id number]   Umbilical Hernia Repair   LUMBAR LAMINECTOMY/DECOMPRESSION MICRODISCECTOMY Right 05/18/2016   Procedure: right L4-5 microdiscectomy;  Surgeon: Blanche East, MD;  Location: [facility] ORS;  Service: Neurosurgery;  Laterality: Right;   RIGHT/LEFT HEART CATH AND CORONARY ANGIOGRAPHY N/A 01/20/2021  Procedure: RIGHT/LEFT HEART CATH AND CORONARY ANGIOGRAPHY;  Surgeon: Troy Sine, MD;  Location: [place] CV LAB;  Service: Cardiovascular;  Laterality: N/A;    TONSILLECTOMY     UMBILICAL HERNIA REPAIR N/A 06/29/2018   Procedure: LAPAROSCOPIC REPAIR OF RECURRENT UMBILICAL HERNIA WITH MESH;  Surgeon: Vickie Epley, MD;  Location: [facility] ORS;  Service: General;  Laterality: N/A;    Prior to Admission medications   Medication Sig Start Date End Date Taking? Authorizing Provider  albuterol (VENTOLIN HFA) 108 (90 Base) MCG/ACT inhaler INHALE 1-2 PUFFS INTO THE LUNGS EVERY 4 (FOUR) HOURS AS NEEDED FOR WHEEZING OR SHORTNESS OF BREATH. 04/11/20   Karamalegos, Alexander J, DO  fluticasone (FLONASE) 50 MCG/ACT nasal spray PLACE 2 SPRAYS INTO BOTH NOSTRILS DAILY. USE FOR 4-6 WEEKS THEN STOP AND USE SEASONALLY OR AS NEEDED. Patient taking differently: Place 2 sprays into both nostrils daily as needed for allergies. 07/20/20   Karamalegos, [name], DO  folic acid (FOLVITE) 1 MG tablet Take 1 tablet (1 mg total) by mouth daily. 01/27/21   Karamalegos, [name], DO  hydrOXYzine (ATARAX/VISTARIL) 25 MG tablet Take 1 tablet (25 mg total) by mouth every 6 (six) hours as needed for anxiety (or CIWA score </= 10). 01/21/21   Raiford Noble Latif, DO  metoprolol succinate (TOPROL-XL) 100 MG 24 hr tablet Take 1 tablet (100 mg total) by mouth daily. Take with or immediately following a meal. 02/02/21   Dwyane Dee, MD  omeprazole (PRILOSEC) 40 MG capsule TAKE 1 CAPSULE BY MOUTH TWICE A DAY Patient taking differently: Take 40 mg by mouth 2 (two) times daily. 12/20/20   Karamalegos, [name], DO  ondansetron (ZOFRAN) 4 MG tablet Take 1 tablet (4 mg total) by mouth every 6 (six) hours as needed for nausea. 01/21/21   Sheikh, Omair Latif, DO  sacubitril-valsartan (ENTRESTO) 24-26 MG Take 1 tablet by mouth 2 (two) times daily. 01/21/21   Raiford Noble Latif, DO  spironolactone (ALDACTONE) 25 MG tablet Take 1 tablet (25 mg total) by mouth daily. 01/26/21   Katherine Roan, MD  thiamine 100 MG tablet Take 1 tablet (100 mg total) by mouth daily. 01/27/21   Olin Hauser, DO  Vitamins/Minerals TABS Take 1 tablet by mouth daily.    [provider]    Allergies Benazepril, Naltrexone, and Nsaids  Family History  Problem Relation Age of Onset   Hypertension Mother    Cancer Mother        Ovarian   Cervical cancer Mother    Hypertension Father    Cancer Father        tongue   Throat cancer Father    Hypertension Brother    Prostate cancer Neg Hx    Breast cancer Neg Hx    Colon cancer Neg Hx    Heart attack Neg Hx    Stroke Neg Hx     Social History Social History   Tobacco Use   Smoking status: Former    Packs/day: 2.00    Years: 25.00    Pack years: 50.00    Types: Cigarettes    Quit date: 05/31/1987    Years since quitting: 33.7   Smokeless tobacco: Never  Vaping Use   Vaping Use: Never used  Substance Use Topics   Alcohol use: Yes    Alcohol/week: 14.0 standard drinks    Types: 14 Cans of beer per week    Comment: former usage. States he drinks around 2 beer a day  Drug use: No    Review of Systems Constitutional: No fever/chills Eyes: No visual changes. ENT: No sore throat. Cardiovascular: Denies chest pain. Respiratory: Denies shortness of breath. Positive for cough.  Gastrointestinal: No abdominal pain.  No nausea, no vomiting.  No diarrhea.   Genitourinary: Negative for dysuria. Musculoskeletal: Positive for joint pain.  Skin: Negative for rash. Neurological: Negative for headaches, focal weakness or numbness.  ____________________________________________   PHYSICAL EXAM:  VITAL SIGNS: ED Triage Vitals [02/10/21 1507]  Enc Vitals Group     BP (!) 140/100     Pulse Rate (!) 120     Resp 20     Temp 98.2 F (36.8 C)     Temp Source Oral     SpO2 97 %     Weight 141 lb 1.5 oz (64 kg)     Height '5\' 6"'$  (1.676 m)     Head Circumference      Peak Flow      Pain Score 5   Constitutional: Alert and oriented.  Eyes: Conjunctivae are normal.  ENT      Head: Normocephalic and atraumatic.      Nose:  No congestion/rhinnorhea.      Mouth/Throat: Mucous membranes are moist.      Neck: No stridor. Hematological/Lymphatic/Immunilogical: No cervical lymphadenopathy. Cardiovascular: Normal rate, regular rhythm.  No murmurs, rubs, or gallops.  Respiratory: Normal respiratory effort without tachypnea nor retractions. Breath sounds are clear and equal bilaterally. No wheezes/rales/rhonchi. Gastrointestinal: Soft and non tender. No rebound. No guarding.  Genitourinary: Deferred Musculoskeletal: Normal range of motion in all extremities. No lower extremity edema. Neurologic:  Normal speech and language. No gross focal neurologic deficits are appreciated.  Skin:  Skin is warm, dry and intact. No rash noted. Psychiatric: Mood and affect are normal. Speech and behavior are normal. Patient exhibits appropriate insight and judgment.  ____________________________________________    LABS (pertinent positives/negatives)  CBC wbc 5.8, hgb 16.5, plt 210 CMP wnl except glu 122, ca 8.5, ast 43 Ethanol [id number] Salicylate [id number] Acetaminophen <10 ____________________________________________   EKG  None  ____________________________________________    RADIOLOGY  None  ____________________________________________   PROCEDURES  Procedures  ____________________________________________   INITIAL IMPRESSION / ASSESSMENT AND PLAN / ED COURSE  Pertinent labs & imaging results that were available during my care of the patient were reviewed by me and considered in my medical decision making (see chart for details).   Patient presented to the emergency department today because of concerns for possible alcohol withdrawal.  He also complains of some joint pain and a cough.  He states that he is interested in detox.  Will have TTS evaluate for detox placement.  Will order patient fluids thiamine and folate.  Additionally will check COVID.  Discussed plan with patient.  Shortly after my initial discussion  with the patient he chose to left the hospital. He eloped prior to my being able to discuss his reasons for wanting to leave.   ____________________________________________   FINAL CLINICAL IMPRESSION(S) / ED DIAGNOSES  Final diagnoses:  Alcohol abuse     Note: This dictation was prepared with Dragon dictation. Any transcriptional errors that result from this process are unintentional     Nance Pear, MD 02/10/21 1636

## 2021-02-10 NOTE — H&P (Signed)
[name] DOB: [birth year] DOA: 02/10/2021     PCP: Olin Hauser, DO   Outpatient Specialists:   CARDS: Dr. Katherine Roan,    Patient arrived to ER on 02/10/21 at 2032 Referred by Attending Fredia Sorrow, MD   Patient coming from: home Lives  With family    Chief Complaint:   Chief Complaint  Patient presents with   Detox    HPI: Luis Porter is a 60 y.o. male with medical history significant of EtOh abuse, CHF systolic/diastolic, GERD, HTN,PUD, anemia, asthma  Presented with  started to have bodyaches and tremors was seen by PCP sent to [facility] ER but left to go home and drank some EtOh. But still feels like he is detoxing feeling all shaky even with EtOH level of 200 Patient admitted to drinking 18 beers a day.  When he went home he 1 he had 3.  Reports he has tried to quit and transitioned himself to liquor hoping it would be easier to quit. He has been drinking about a fith each day for over a months Every morning he wakes up with shaking and needs to drink to help it, remembers having shaking even as a child No history of alcohol withdrawal seizures or intubation Prior admission for diverticulitis he was able to stay clean afterwards for at least 3 weeks but then relapsed. His wife continues to drink heavily which makes it difficult to quit.   Regarding history of CHF he has been taking all his medications as prescribed he has a vest he supposed to wear but he forgot it at home.  He had an episode once where his vest had alarmed and he went to emergency department to have this evaluated and was found to be benign  He has also been taking some anxiety pills at home  No prescription for benzodiazepines    Initial COVID TEST  NEGATIVE   Lab Results  Component Value Date   [place] 02/10/2021   [place] NEGATIVE 01/31/2021   [place] NEGATIVE 01/16/2021   [place] NEGATIVE 07/28/2020      Regarding pertinent Chronic problems:      HTN on SPironolactone   chronic CHF diastolic/systolic/ combined - last echo 12/2020  Showing EF 0000000 DIASTOLIC chf GRADE 1 Supposed to be on Entresto, aldactone   Asthma -well   controlled on home inhalers/ nebs f         While in ER: Started on CIWA covid neg Iv fluids given    ED Triage Vitals  Enc Vitals Group     BP 02/10/21 2043 (!) 136/100     Pulse Rate 02/10/21 2043 (!) 108     Resp 02/10/21 2043 (!) 21     Temp 02/10/21 2043 97.8 F (36.6 C)     Temp Source 02/10/21 2043 Oral     SpO2 02/10/21 2043 98 %     Weight 02/10/21 2048 141 lb (64 kg)     Height 02/10/21 2048 '5\' 6"'$  (1.676 m)     Head Circumference --      Peak Flow --      Pain Score 02/10/21 2048 0     Pain Loc --      Pain Edu? --      Excl. in [place]? --   TMAX(24)@     _________________________________________ Significant initial  Findings: Abnormal Labs Reviewed  COMPREHENSIVE METABOLIC PANEL - Abnormal; Notable for the following components:  Result Value   Glucose, Bld 111 (*)    Calcium 8.3 (*)    AST 43 (*)    All other components within normal limits  ETHANOL - Abnormal; Notable for the following components:   Alcohol, Ethyl (B) 176 (*)    All other components within normal limits   ____________________________________________ Ordered    ECG: Ordered   ____________   The recent clinical data is shown below. Vitals:   02/10/21 2133 02/10/21 2145 02/10/21 2158 02/10/21 2228  BP: (!) 144/110 (!) 131/91 (!) 142/89 120/86  Pulse: (!) 112 (!) 107 (!) 102 (!) 107  Resp: 20  (!) 21 15  Temp:      TempSrc:      SpO2: 95%  97% 94%  Weight:      Height:        WBC     Component Value Date/Time   WBC 8.6 02/10/2021 2137   LYMPHSABS 1.9 02/10/2021 2137   MONOABS 0.3 02/10/2021 2137   EOSABS 0.0 02/10/2021 2137   BASOSABS 0.1 02/10/2021 2137       UA  ordered    Results for orders placed or performed during the hospital encounter  of 02/10/21  Resp Panel by RT-PCR (Flu A&B, Covid) Nasopharyngeal Swab     Status: None   Collection Time: 02/10/21  9:37 PM   Specimen: Nasopharyngeal Swab; Nasopharyngeal(NP) swabs in vial transport medium  Result Value Ref Range Status   SARS Coronavirus 2 by RT PCR NEGATIVE NEGATIVE Final         Influenza A by PCR NEGATIVE NEGATIVE Final   Influenza B by PCR NEGATIVE NEGATIVE Final           _______________________________________________ Hospitalist was called for admission for alcohol withdrawal  The following Work up has been ordered so far:  Orders Placed This Encounter  Procedures   Resp Panel by RT-PCR (Flu A&B, Covid) Nasopharyngeal Swab   Comprehensive metabolic panel   CBC with Differential/Platelet   Lipase, blood   Ethanol   Cardiac monitoring   Vital signs   Notify physician (specify)   Clinical institute withdrawal assessment every 6 hours X 48 hours, then every 12 hours x 48 hours   Notify MD if CIWA-AR is greater than 10 for 2 consecutive assessments.   May administer Ativan PO versus IV if patient is tolerating PO intake well   Vital signs every 6 hours X 48 hours, then per unit protocol   Full code   Consult to hospitalist   Airborne and Contact precautions   ED EKG   Following Medications were ordered in ER: Medications  0.9 %  sodium chloride infusion ( Intravenous New Bag/Given 02/10/21 2151)  LORazepam (ATIVAN) injection 0-4 mg (1 mg Intravenous Given 02/10/21 2156)    Or  LORazepam (ATIVAN) tablet 0-4 mg ( Oral See Alternative 02/10/21 2156)  LORazepam (ATIVAN) injection 0-4 mg (has no administration in time range)    Or  LORazepam (ATIVAN) tablet 0-4 mg (has no administration in time range)  thiamine tablet 100 mg ( Oral See Alternative 02/10/21 2146)    Or  thiamine (B-1) injection 100 mg (100 mg Intravenous Given 02/10/21 2146)  sodium chloride 0.9 % bolus 1,000 mL (1,000 mLs Intravenous New Bag/Given 02/10/21 2145)        Consult Orders   (From admission, onward)           Start     Ordered   02/10/21 2317  Consult to hospitalist  Once       Provider:  (Not yet assigned)  Question Answer Comment  Place call to: [facility] [id number] Alcohol Withdrawal   Reason for Consult Admit      02/10/21 2316              OTHER Significant initial  Findings:  labs showing:    Recent Labs  Lab 02/04/21 0908 02/05/21 2001 02/08/21 2124 02/10/21 1509 02/10/21 2137  NA 138 142 143 139 141  K 3.8 3.7 3.9 3.9 3.9  CO2 '24 27 24 23 22  '$ GLUCOSE 99 96 106* 122* 111*  BUN '7 6 7 10 10  '$ CREATININE 0.56* 0.75 0.69 0.64 0.64  CALCIUM 8.4* 8.4* 9.0 8.5* 8.3*    Cr   stable,    Lab Results  Component Value Date   CREATININE 0.64 02/10/2021   CREATININE 0.64 02/10/2021   CREATININE 0.69 02/08/2021    Recent Labs  Lab 02/05/21 2001 02/08/21 2124 02/10/21 1509 02/10/21 2137  AST 25 32 43* 43*  ALT '19 23 24 24  '$ ALKPHOS 52 62 67 68  BILITOT 0.4 0.6 1.0 0.6  PROT 6.5 7.6 7.7 7.7  ALBUMIN 3.3* 3.8 4.1 4.2   Lab Results  Component Value Date   CALCIUM 8.3 (L) 02/10/2021   PHOS 4.0 01/21/2021          Plt: Lab Results  Component Value Date   PLT 181 02/10/2021       COVID-19 Labs  No results for input(s): DDIMER, FERRITIN, LDH, CRP in the last 72 hours.  Lab Results  Component Value Date   SARSCOV2NAA NEGATIVE 02/10/2021   SARSCOV2NAA NEGATIVE 01/31/2021   SARSCOV2NAA NEGATIVE 01/16/2021   [place] NEGATIVE 07/28/2020        Recent Labs  Lab 02/05/21 2001 02/08/21 2124 02/10/21 1509 02/10/21 2137  WBC 8.1 6.9 5.8 8.6  NEUTROABS 3.5  --   --  6.4  HGB 13.6 16.4 16.5 15.4  HCT 39.5 47.8 45.5 43.7  MCV 96.3 95.4 93.0 94.8  PLT 438* 283 210 181    HG/HCT * stable,  Down *Up from baseline see below    Component Value Date/Time   HGB 15.4 02/10/2021 2137   HGB 16.1 01/12/2021 1034   HCT 43.7 02/10/2021 2137   HCT 47.1 01/12/2021 1034   MCV 94.8 02/10/2021 2137   MCV 94  01/12/2021 1034      Recent Labs  Lab 02/10/21 2137  LIPASE 43   No results for input(s): AMMONIA in the last 168 hours.   Cardiac Panel (last 3 results) No results for input(s): CKTOTAL, CKMB, TROPONINI, RELINDX in the last 72 hours.   BNP (last 3 results) Recent Labs    01/31/21 1655 02/05/21 2002  BNP 31.2 116.0*          Cultures:    Component Value Date/Time   SDES BLOOD BLOOD RIGHT HAND 09/19/2019 1818   SPECREQUEST  09/19/2019 1818    BOTTLES DRAWN AEROBIC AND ANAEROBIC Blood Culture adequate volume   CULT  09/19/2019 1818    NO GROWTH 5 DAYS Performed at [facility], [address] [place], [place] [id number]    REPTSTATUS 09/24/2019 FINAL 09/19/2019 1818     Radiological Exams on Admission: No results found. _______________________________________________________________________________________________________ Latest  Blood pressure 120/86, pulse (!) 107, temperature 97.8 F (36.6 C), temperature source Oral, resp. rate 15, height '5\' 6"'$  (1.676 m), weight 64 kg, SpO2 94 %.   Review of Systems:  Pertinent positives include: tremors, joint pain   Constitutional:  No weight loss, night sweats, Fevers, chills, fatigue, weight loss  HEENT:  No headaches, Difficulty swallowing,Tooth/dental problems,Sore throat,  No sneezing, itching, ear ache, nasal congestion, post nasal drip,  Cardio-vascular:  No chest pain, Orthopnea, PND, anasarca, dizziness, palpitations.no Bilateral lower extremity swelling  GI:  No heartburn, indigestion, abdominal pain, nausea, vomiting, diarrhea, change in bowel habits, loss of appetite, melena, blood in stool, hematemesis Resp:  no shortness of breath at rest. No dyspnea on exertion, No excess mucus, no productive cough, No non-productive cough, No coughing up of blood.No change in color of mucus.No wheezing. Skin:  no rash or lesions. No jaundice GU:  no dysuria, change in color of urine, no urgency or frequency.  No straining to urinate.  No flank pain.  Musculoskeletal:  No joint pain or no joint swelling. No decreased range of motion. No back pain.  Psych:  No change in mood or affect. No depression or anxiety. No memory loss.  Neuro: no localizing neurological complaints, no tingling, no weakness, no double vision, no gait abnormality, no slurred speech, no confusion  All systems reviewed and apart from [place] all are negative _______________________________________________________________________________________________ Past Medical History:   Past Medical History:  Diagnosis Date   Chronic systolic heart failure ([place])    felt to be alcohol-related   Diverticulosis    Gastric ulcer    GERD (gastroesophageal reflux disease)    History of hiatal hernia    Hypertension    Iron deficiency anemia 10/18/2019   Recurrent umbilical hernia with incarceration 11/08/2016   Previously repaired x 1     Past Surgical History:  Procedure Laterality Date   BACK SURGERY     COLONOSCOPY WITH PROPOFOL N/A 12/30/2016   Procedure: COLONOSCOPY WITH PROPOFOL;  Surgeon: [name], MD;  Location: [facility] ENDOSCOPY;  Service: Endoscopy;  Laterality: N/A;   COLONOSCOPY WITH PROPOFOL N/A 10/31/2019   Procedure: COLONOSCOPY WITH PROPOFOL;  Surgeon: Lin Landsman, MD;  Location: [facility] ENDOSCOPY;  Service: Gastroenterology;  Laterality: N/A;   ESOPHAGOGASTRODUODENOSCOPY (EGD) WITH PROPOFOL N/A 12/30/2016   Procedure: ESOPHAGOGASTRODUODENOSCOPY (EGD) WITH PROPOFOL;  Surgeon: [name], MD;  Location[facility] ENDOSCOPY;  Service: Endoscopy;  Laterality: N/A;   ESOPHAGOGASTRODUODENOSCOPY (EGD) WITH PROPOFOL N/A 10/31/2019   Procedure: ESOPHAGOGASTRODUODENOSCOPY (EGD) WITH PROPOFOL;  Surgeon: Lin Landsman, MD;  Location: [facility] ENDOSCOPY;  Service: Gastroenterology;  Laterality: N/A;   HERNIA REPAIR  [id number]   Umbilical Hernia Repair   LUMBAR LAMINECTOMY/DECOMPRESSION MICRODISCECTOMY Right 05/18/2016   Procedure: right  L4-5 microdiscectomy;  Surgeon: Blanche East, MD;  Location: [facility] ORS;  Service: Neurosurgery;  Laterality: Right;   RIGHT/LEFT HEART CATH AND CORONARY ANGIOGRAPHY N/A 01/20/2021   Procedure: RIGHT/LEFT HEART CATH AND CORONARY ANGIOGRAPHY;  Surgeon: Troy Sine, MD;  Location: [facility];  Service: Cardiovascular;  Laterality: N/A;   TONSILLECTOMY     UMBILICAL HERNIA REPAIR N/A 06/29/2018   Procedure: LAPAROSCOPIC REPAIR OF RECURRENT UMBILICAL HERNIA WITH MESH;  Surgeon: Vickie Epley, MD;  Location: [facility] ORS;  Service: General;  Laterality: N/A;    Social History:  Ambulatory  independently      reports that he quit smoking about 33 years ago. His smoking use included cigarettes. He has a 50.00 pack-year smoking history. He has never used smokeless tobacco. He reports current alcohol use of about 14.0 standard drinks per week. He reports that he does not use drugs.     Family History:   Family  History  Problem Relation Age of Onset   Hypertension Mother    Cancer Mother        Ovarian   Cervical cancer Mother    Hypertension Father    Cancer Father        tongue   Throat cancer Father    Hypertension Brother    Prostate cancer Neg Hx    Breast cancer Neg Hx    Colon cancer Neg Hx    Heart attack Neg Hx    Stroke Neg Hx    ______________________________________________________________________________________________ Allergies: Allergies  Allergen Reactions   Benazepril Swelling and Other (See Comments)    Angioedema   Naltrexone Other (See Comments)    Made the patient "feel angry"   Nsaids Other (See Comments)    Due to history of ulcers, should not have this class of medication      Prior to Admission medications   Medication Sig Start Date End Date Taking? Authorizing Provider  albuterol (VENTOLIN HFA) 108 (90 Base) MCG/ACT inhaler INHALE 1-2 PUFFS INTO THE LUNGS EVERY 4 (FOUR) HOURS AS NEEDED FOR WHEEZING OR SHORTNESS OF BREATH. 04/11/20   Yes Karamalegos, Alexander J, DO  fluticasone (FLONASE) 50 MCG/ACT nasal spray PLACE 2 SPRAYS INTO BOTH NOSTRILS DAILY. USE FOR 4-6 WEEKS THEN STOP AND USE SEASONALLY OR AS NEEDED. Patient taking differently: Place 2 sprays into both nostrils daily as needed for allergies. 07/20/20  Yes Karamalegos, [name], DO  folic acid (FOLVITE) 1 MG tablet Take 1 tablet (1 mg total) by mouth daily. 01/27/21  Yes Karamalegos, [name], DO  hydrOXYzine (ATARAX/VISTARIL) 25 MG tablet Take 1 tablet (25 mg total) by mouth every 6 (six) hours as needed for anxiety (or CIWA score </= 10). 01/21/21  Yes Sheikh, Omair Latif, DO  loratadine (CLARITIN) 10 MG tablet Take 10 mg by mouth daily as needed for allergies or rhinitis.   Yes [provider]  meclizine (ANTIVERT) 25 MG tablet Take 25 mg by mouth 3 (three) times daily as needed for dizziness.   Yes [provider]  metoprolol succinate (TOPROL-XL) 100 MG 24 hr tablet Take 1 tablet (100 mg total) by mouth daily. Take with or immediately following a meal. 02/02/21  Yes Dwyane Dee, MD  MIDOL COMPLETE 500-60-15 MG TABS Take 2 tablets by mouth 3 (three) times a week.   Yes [provider]  multivitamin (ONE-A-DAY MEN'S) TABS tablet Take 1 tablet by mouth daily with breakfast.   Yes [provider]  omeprazole (PRILOSEC) 40 MG capsule TAKE 1 CAPSULE BY MOUTH TWICE A DAY Patient taking differently: Take 40 mg by mouth 2 (two) times daily. 12/20/20  Yes Karamalegos, [name], DO  ondansetron (ZOFRAN-ODT) 4 MG disintegrating tablet Take 4 mg by mouth every 8 (eight) hours as needed for nausea or vomiting (dissolve orally).   Yes [provider]  sacubitril-valsartan (ENTRESTO) 24-26 MG Take 1 tablet by mouth 2 (two) times daily. 01/21/21  Yes Sheikh, Omair Latif, DO  spironolactone (ALDACTONE) 25 MG tablet Take 1 tablet (25 mg total) by mouth daily. 01/26/21  Yes Katherine Roan, MD  thiamine 100 MG tablet Take 1 tablet  (100 mg total) by mouth daily. 01/27/21  Yes Karamalegos, Alexander J, DO  ondansetron (ZOFRAN) 4 MG tablet Take 1 tablet (4 mg total) by mouth every 6 (six) hours as needed for nausea. Patient not taking: Reported on 02/10/2021 01/21/21   Kerney Elbe, DO    ___________________________________________________________________________________________________ Physical Exam: Vitals with BMI  02/10/2021 02/10/2021 02/10/2021  Height - - -  Weight - - -  BMI - - -  Systolic [id number]  Diastolic 86 89 91  Pulse [id number] 102 107     1. General:  in No  Acute distress    Chronically ill   -appearing 2. Psychological: Alert and  Oriented 3. Head/ENT:   Dry Mucous Membranes                          Head Non traumatic, neck supple                          Poor Dentition 4. SKIN:  decreased Skin turgor,  Skin clean Dry and intact no rash 5. Heart: Regular rate and rhythm no  Murmur, no Rub or gallop 6. Lungs:  Clear to auscultation bilaterally, no wheezes or crackles   7. Abdomen: Soft,  non-tender, Non distended  8. Lower extremities: no clubbing, cyanosis, no  edema 9. Neurologically Grossly intact, moving all 4 extremities equally  tremulous   10. MSK: Normal range of motion    Chart has been reviewed  ______________________________________________________________________________________________  Assessment/Plan  60 y.o. male with medical history significant of EtOh abuse, CHF systolic/diastolic, GERD, HTN,PUD, anemia, asthma   Admitted for EtOh oh withdraw  Present on Admission:  Alcohol withdrawal syndrome without complication ([place]) order CIWA protocol monitor in progressive for any signs of severe withdrawals.  Spoke extensively regarding need to quit drinking.  Patient states he is interested.  We will order transitional care consult   Alcohol abuse -counseled regarding quitting   Mixed hyperlipidemia -check lipid Panel   GERD (gastroesophageal reflux disease) continue PPI     Essential tremor -we will need to follow-up with neurology after discharge as this could be partially contributing to alcohol abuse   Essential hypertension -continue home medication   Chronic combined systolic and diastolic heart failure ([place]) Continue home medications email cardiology to let them know patient has been admitted given significant history of significant history of CHF appreciate the help with management  Joint aches most likely secondary to alcohol withdrawal but will check sed rate for completion Other plan as per orders.  DVT prophylaxis:  SCD      Code Status:    Code Status: Full Code FULL CODE    as per patient   I had personally discussed CODE STATUS with patient      Family Communication:   Family not at  Bedside    Disposition Plan:      To home once workup is complete and patient is stable   Following barriers for discharge:                            Electrolytes corrected                                                           Pain controlled with PO medications  Will need consultants to evaluate patient prior to discharge    Would benefit from PT/OT eval prior to DC  Ordered                                       Transition of care consulted                   Nutrition    consulted                      Consults called: emailed  cardiology  Admission status:  ED Disposition     ED Disposition  Admit   Condition  --   Comment  The patient appears reasonably stabilized for admission considering the current resources, flow, and capabilities available in the ED at this time, and I doubt any other [facility] requiring further screening and/or treatment in the ED prior to admission is  present.           Obs     Level of care  progressive tele indefinitely please discontinue once patient no longer qualifies COVID-19 Labs    Lab Results  Component Value Date   [place]  02/10/2021     Precautions: admitted as  Covid Negative    PPE: Used by the provider:   N95  eye Goggles,  Gloves     Earla Charlie 02/11/2021, 1:17 AM    [facility]     after 2 AM please page floor coverage PA If 7AM-7PM, please contact the day team taking care of the patient using [email or web address]   Patient was evaluated in the context of the global COVID-19 pandemic, which necessitated consideration that the patient might be at risk for infection with the SARS-CoV-2 virus that causes COVID-19. Institutional protocols and algorithms that pertain to the evaluation of patients at risk for COVID-19 are in a state of rapid change based on information released by regulatory bodies including the CDC and federal and state organizations. These policies and algorithms were followed during the patient's care.

## 2021-02-10 NOTE — Progress Notes (Signed)
Subjective:    Patient ID: Darrel Hoover Downard, male    DOB: 02/12/1961, 60 y.o.   MRN: [id number]  Rocio Rome Kimoto is a 60 y.o. male presenting on 02/10/2021 for Alcohol Problem   HPI  Alcohol Dependence, withdrawal complicated  Last drink 1 beer 12 oz can today. He said last drink 2 weeks ago however chart review shows he was in ED on 02/08/21 2 days ago with alcohol intoxication He expresses confusion. Unsure of day Last year he attempted to do medication for alcohol withdrawal failed due to side effect  Cardiomyopathy He has life vest on [facility] put him out of work through end of November, until after Thanksgiving.  ED on 02/08/21 attempted to pursue placement at [facility] in [place] - he did a phone screening. But has not heard back.  Wife in [place] to get Detox.  He drove over here. But he does not feel safe driving away or driving to hospital.   Depression screen [facility] 2/9 09/30/2019 11/20/2018 11/08/2016  Decreased Interest 0 0 0  Down, Depressed, Hopeless 0 0 0  PHQ - 2 Score 0 0 0  Altered sleeping - - 0  Tired, decreased energy - - 1  Change in appetite - - 0  Feeling bad or failure about yourself  - - 0  Trouble concentrating - - 0  Moving slowly or fidgety/restless - - 0  Suicidal thoughts - - 0  PHQ-9 Score - - 1    Social History   Tobacco Use   Smoking status: Former    Packs/day: 2.00    Years: 25.00    Pack years: 50.00    Types: Cigarettes    Quit date: 05/31/1987    Years since quitting: 33.7   Smokeless tobacco: Never  Vaping Use   Vaping Use: Never used  Substance Use Topics   Alcohol use: Yes    Alcohol/week: 14.0 standard drinks    Types: 14 Cans of beer per week    Comment: former usage. States he drinks around 2 beer a day   Drug use: No    Review of Systems Per HPI unless specifically indicated above     Objective:    BP (!) 144/109 (BP Location: Left Arm, Patient Position: Sitting, Cuff Size: Normal)    Pulse (!) 122   Wt Readings from Last 3 Encounters:  02/10/21 141 lb 1.5 oz (64 kg)  02/08/21 140 lb (63.5 kg)  02/05/21 165 lb 5.5 oz (75 kg)    Physical Exam Vitals and nursing note reviewed.  Constitutional:      General: He is not in acute distress.    Appearance: He is well-developed. He is not diaphoretic.     Comments: Ill appearing uncomfortable, cooperative, seated in wheelchair  HENT:     Head: Normocephalic and atraumatic.  Eyes:     General:        Right eye: No discharge.        Left eye: No discharge.     Conjunctiva/sclera: Conjunctivae normal.  Neck:     Thyroid: No thyromegaly.  Cardiovascular:     Rate and Rhythm: Regular rhythm. Tachycardia present.     Pulses: Normal pulses.     Heart sounds: Normal heart sounds. No murmur heard. Pulmonary:     Effort: Pulmonary effort is normal. No respiratory distress.     Breath sounds: Normal breath sounds. No wheezing or rales.  Musculoskeletal:        General: Normal  range of motion.     Cervical back: Normal range of motion and neck supple.     Right lower leg: No edema.     Left lower leg: No edema.  Lymphadenopathy:     Cervical: No cervical adenopathy.  Skin:    General: Skin is warm and dry.     Findings: No erythema or rash.  Neurological:     Mental Status: He is alert and oriented to person, place, and time. Mental status is at baseline.     Comments: Some fine tremors of upper extremities and whole body during exam  Psychiatric:        Behavior: Behavior normal.     Comments: Well groomed, good eye contact, normal speech and thoughts    Results for orders placed or performed during the hospital encounter of 02/08/21  Comprehensive metabolic panel  Result Value Ref Range   Sodium 143 135 - 145 mmol/L   Potassium 3.9 3.5 - 5.1 mmol/L   Chloride 104 98 - 111 mmol/L   CO2 24 22 - 32 mmol/L   Glucose, Bld 106 (H) 70 - 99 mg/dL   BUN 7 6 - 20 mg/dL   Creatinine, Ser 0.69 0.61 - 1.24 mg/dL   Calcium  9.0 8.9 - 10.3 mg/dL   Total Protein 7.6 6.5 - 8.1 g/dL   Albumin 3.8 3.5 - 5.0 g/dL   AST 32 15 - 41 U/L   ALT 23 0 - 44 U/L   Alkaline Phosphatase 62 38 - 126 U/L   Total Bilirubin 0.6 0.3 - 1.2 mg/dL   GFR, Estimated >60 >60 mL/min   Anion gap 15 5 - 15  Ethanol  Result Value Ref Range   Alcohol, Ethyl (B) 354 (HH) <10 mg/dL  cbc  Result Value Ref Range   WBC 6.9 4.0 - 10.5 K/uL   RBC 5.01 4.22 - 5.81 MIL/uL   Hemoglobin 16.4 13.0 - 17.0 g/dL   HCT 47.8 39.0 - 52.0 %   MCV 95.4 80.0 - 100.0 fL   MCH 32.7 26.0 - 34.0 pg   MCHC 34.3 30.0 - 36.0 g/dL   RDW 14.6 11.5 - 15.5 %   Platelets 283 150 - 400 K/uL   nRBC 0.0 0.0 - 0.2 %  Rapid urine drug screen (hospital performed)  Result Value Ref Range   Opiates NONE DETECTED NONE DETECTED   Cocaine NONE DETECTED NONE DETECTED   Benzodiazepines POSITIVE (A) NONE DETECTED   Amphetamines NONE DETECTED NONE DETECTED   Tetrahydrocannabinol NONE DETECTED NONE DETECTED   Barbiturates NONE DETECTED NONE DETECTED      Assessment & Plan:   Problem List Items Addressed This Visit   None Visit Diagnoses     Alcohol withdrawal syndrome with complication ([place])    -  Primary       Acute alcohol withdrawal recent ED visit 02/08/21 intoxicated with elevated blood alcohol level, then last drink 1 can of beer small amount today.  Patient presents with confusion today, and appears weak and debilitated. His vitals indicate hypertension worsening and tachycardia HR up to 122 in office.  He is currently oriented but is struggling with recall and recent history / time with memory loss.  He was not able to be placed with Detox Inpatient facility from [place] on 02/08/21 and ultimately he left hospital.  He has limited support at home. His wife is out of town in [place] for detox for alcohol as well.  He has failed oral  outpatient detox for alcohol in past.  Advised limited options, cannot guarantee placement for detox and he is not  agreeable to go regardless. He prefers to go back to hospital. He feels unable and unsafe to drive, we will call [id number] ambulance to take him via EMS to [facility] hospital. I called triage in ED and LCSW in ED for placement.  No orders of the defined types were placed in this encounter.    Follow up plan: Return if symptoms worsen or fail to improve.   Nobie Putnam, [facility] 02/10/2021, 1:52 PM

## 2021-02-10 NOTE — Progress Notes (Signed)
  CSW was contacted by PCP Dr. Parks Ranger w/ update on patient.  Dr. Parks Ranger stated patient is currently withdrawing from alcohol intoxication and is experiencing Dts. Dr. Parks Ranger stated he is recommending the patient come to [facility] ED for observation and to possibly find substance use rehab placement.  CSW updated [facility] ED Agricultural consultant.

## 2021-02-11 DIAGNOSIS — F10230 Alcohol dependence with withdrawal, uncomplicated: Secondary | ICD-10-CM | POA: Diagnosis not present

## 2021-02-11 LAB — RAPID URINE DRUG SCREEN, HOSP PERFORMED
Amphetamines: NOT DETECTED
Barbiturates: NOT DETECTED
Benzodiazepines: POSITIVE — AB
Cocaine: NOT DETECTED
Opiates: NOT DETECTED
Tetrahydrocannabinol: NOT DETECTED

## 2021-02-11 LAB — SEDIMENTATION RATE: Sed Rate: 3 mm/hr (ref 0–16)

## 2021-02-11 LAB — LIPID PANEL
Cholesterol: 172 mg/dL (ref 0–200)
HDL: 55 mg/dL (ref >40)
LDL Cholesterol: 86 mg/dL (ref 0–99)
Total CHOL/HDL Ratio: 3.1 RATIO
Triglycerides: 156 mg/dL — ABNORMAL HIGH (ref <150)
VLDL: 31 mg/dL (ref 0–40)

## 2021-02-11 MED ORDER — CHLORDIAZEPOXIDE HCL 25 MG PO CAPS: ORAL_CAPSULE | ORAL | 0 refills | Status: AC

## 2021-02-11 NOTE — Discharge Summary (Signed)
Physician Discharge Summary  Jaivian Guillot Hillegass [id number] DOB: [birth year] DOA: 02/10/2021  PCP: Olin Hauser, DO  Admit date: 02/10/2021 Discharge date: 02/11/2021  Admitted From: Home Disposition: Home  Recommendations for Outpatient Follow-up:  Follow up with PCP in 1 week Information by transitions of care given to patient for [facility] for substance abuse Please obtain BMP/CBC in one week Please follow up on the following pending results: None  Home Health: None Equipment/Devices: None  Discharge Condition: Stable CODE STATUS: Full code Diet recommendation: Low-salt diet  Brief/Interim Summary:  Admission HPI written by Toy Baker, MD   HPI: Arvin Raub Olesen is a 60 y.o. male with medical history significant of EtOh abuse, CHF systolic/diastolic, GERD, HTN,PUD, anemia, asthma   Presented with  started to have bodyaches and tremors was seen by PCP sent to [facility] ER but left to go home and drank some EtOh. But still feels like he is detoxing feeling all shaky even with EtOH level of 200 Patient admitted to drinking 18 beers a day.  When he went home he 1 he had 3.   Reports he has tried to quit and transitioned himself to liquor hoping it would be easier to quit. He has been drinking about a fith each day for over a months Every morning he wakes up with shaking and needs to drink to help it, remembers having shaking even as a child No history of alcohol withdrawal seizures or intubation Prior admission for diverticulitis he was able to stay clean afterwards for at least 3 weeks but then relapsed. His wife continues to drink heavily which makes it difficult to quit.   Regarding history of CHF he has been taking all his medications as prescribed he has a vest he supposed to wear but he forgot it at home.  He had an episode once where his vest had alarmed and he went to emergency department to have this evaluated and was found to be  benign   He has also been taking some anxiety pills at home  No prescription for benzodiazepines    Hospital course:  Alcohol withdrawal syndrome Alcohol abuse Patient initially wanted to stay in the hospital for alcohol detox.  He was managed via CIWA and with Ativan.  During hospitalization, patient change his mind and decided that he would prefer to be discharged as he had to manage symptoms at home.  He states that his wife is currently admitted for inpatient detox in an out of city facility.  He reported that he continues to want to detox and would like to have medication as an outpatient in order to prevent alcohol withdrawal syndrome.  Patient given a prescription for Librium taper.  Patient instructed not to take the Librium if he decides to return to drinking alcohol.  At the time of discharge, patient was not in significant alcohol withdrawal and had capacity to make decisions.   Discharge Diagnoses:  Principal Problem:   Alcohol withdrawal syndrome without complication (HCC) Active Problems:   Mixed hyperlipidemia   Essential hypertension   Essential tremor   GERD (gastroesophageal reflux disease)   Alcohol abuse   Chronic combined systolic and diastolic heart failure [facility])    Discharge Instructions  Discharge Instructions     Call MD for:  difficulty breathing, headache or visual disturbances   Complete by: As directed    Call MD for:  persistant nausea and vomiting   Complete by: As directed    Increase activity slowly  Complete by: As directed       Allergies as of 02/11/2021       Reactions   Benazepril Swelling, Other (See Comments)   Angioedema   Naltrexone Other (See Comments)   Made the patient "feel angry"   Nsaids Other (See Comments)   Due to history of ulcers, should not have this class of medication        Medication List     TAKE these medications    albuterol 108 (90 Base) MCG/ACT inhaler Commonly known as: VENTOLIN HFA INHALE 1-2  PUFFS INTO THE LUNGS EVERY 4 (FOUR) HOURS AS NEEDED FOR WHEEZING OR SHORTNESS OF BREATH.   chlordiazePOXIDE 25 MG capsule Commonly known as: LIBRIUM Take 1 capsule (25 mg total) by mouth 4 (four) times daily for 1 day, THEN 1 capsule (25 mg total) 3 (three) times daily for 1 day, THEN 1 capsule (25 mg total) 2 (two) times daily for 1 day, THEN 1 capsule (25 mg total) daily in the afternoon for 1 day. Start taking on: February 11, 2021   fluticasone 50 MCG/ACT nasal spray Commonly known as: FLONASE PLACE 2 SPRAYS INTO BOTH NOSTRILS DAILY. USE FOR 4-6 WEEKS THEN STOP AND USE SEASONALLY OR AS NEEDED. What changed:  when to take this reasons to take this additional instructions   folic acid 1 MG tablet Commonly known as: FOLVITE Take 1 tablet (1 mg total) by mouth daily.   hydrOXYzine 25 MG tablet Commonly known as: ATARAX/VISTARIL Take 1 tablet (25 mg total) by mouth every 6 (six) hours as needed for anxiety (or CIWA score </= 10).   loratadine 10 MG tablet Commonly known as: CLARITIN Take 10 mg by mouth daily as needed for allergies or rhinitis.   meclizine 25 MG tablet Commonly known as: ANTIVERT Take 25 mg by mouth 3 (three) times daily as needed for dizziness.   metoprolol succinate 100 MG 24 hr tablet Commonly known as: TOPROL-XL Take 1 tablet (100 mg total) by mouth daily. Take with or immediately following a meal.   Midol Complete 500-60-15 MG Tabs Generic drug: Acetaminophen-Caff-Pyrilamine Take 2 tablets by mouth 3 (three) times a week.   multivitamin Tabs tablet Take 1 tablet by mouth daily with breakfast.   omeprazole 40 MG capsule Commonly known as: PRILOSEC TAKE 1 CAPSULE BY MOUTH TWICE A DAY   ondansetron 4 MG disintegrating tablet Commonly known as: ZOFRAN-ODT Take 4 mg by mouth every 8 (eight) hours as needed for nausea or vomiting (dissolve orally).   ondansetron 4 MG tablet Commonly known as: ZOFRAN Take 1 tablet (4 mg total) by mouth every 6 (six)  hours as needed for nausea.   sacubitril-valsartan 24-26 MG Commonly known as: ENTRESTO Take 1 tablet by mouth 2 (two) times daily.   spironolactone 25 MG tablet Commonly known as: ALDACTONE Take 1 tablet (25 mg total) by mouth daily.   thiamine 100 MG tablet Take 1 tablet (100 mg total) by mouth daily.        Follow-up Information     Olin Hauser, DO. Schedule an appointment as soon as possible for a visit.   Specialty: Family Medicine Why: For hospital follow-up Contact information: [place] [address] [phone]                Allergies  Allergen Reactions   Benazepril Swelling and Other (See Comments)    Angioedema   Naltrexone Other (See Comments)    Made the patient "feel angry"  Nsaids Other (See Comments)    Due to history of ulcers, should not have this class of medication     Consultations: None   Procedures/Studies: CARDIAC CATHETERIZATION  Result Date: 01/20/2021 Normal large epicardial coronary arteries with a left main trifurcating into an LAD, large ramus intermediate vessel, and left circumflex vessel.  Normal dominant RCA. Low right heart pressures. In this patient with EF at 20 to 25%, findings are consistent with a nonischemic cardiomyopathy.  With his history of significant EtOH use, consider alcohol etiology. RECOMMENATION: Guideline directed medical therapy for HFrEF as blood pressure allows.  Consider possible arrhythmic etiology to syncope versus hypotension.  He has a ready for Mannam sinus node   DG Chest Port 1 View  Result Date: 02/05/2021 CLINICAL DATA:  Generalized weakness, altered level of consciousness EXAM: PORTABLE CHEST 1 VIEW COMPARISON:  01/31/2021 FINDINGS: Single frontal view of the chest demonstrates an unremarkable cardiac silhouette. No acute airspace disease, effusion, or pneumothorax. There are no acute bony abnormalities. IMPRESSION: 1. No acute intrathoracic process. Electronically Signed    By: Randa Ngo M.D.   On: 02/05/2021 20:35   DG Chest Portable 1 View  Result Date: 01/31/2021 CLINICAL DATA:  Per order: life vest alarm, near syncope with diaphoresis. Pt BIB EMS from home with c/o generalized weakness and dizziness x 1 month. Pt reported he just stopped drinking 2 weeks ago and was diagnosed with CHF EXAM: PORTABLE CHEST - 1 VIEW COMPARISON:  01/16/2021 FINDINGS: Lungs are clear. Heart size and mediastinal contours are within normal limits. No effusion.  No pneumothorax. Visualized bones unremarkable. IMPRESSION: No acute cardiopulmonary disease. Electronically Signed   By: Lucrezia Europe M.D.   On: 01/31/2021 16:15   DG Chest Portable 1 View  Result Date: 01/16/2021 CLINICAL DATA:  Per EMS, patient from home, c/o 25 pound weight loss in 1 month with decreased PO intake. Fatigue x 3 days. Reports he consumes 5 drinks daily. One shot and one beer today. Hx of liver disease and anemia. EXAM: PORTABLE CHEST 1 VIEW COMPARISON:  02/17/2020 FINDINGS: Normal cardiac silhouette.  No mediastinal or hilar masses. Clear lungs.  No convincing pleural effusion or pneumothorax. Skeletal structures are grossly intact. IMPRESSION: No active disease. Electronically Signed   By: Lajean Manes M.D.   On: 01/16/2021 15:30   ECHOCARDIOGRAM COMPLETE  Result Date: 01/18/2021    ECHOCARDIOGRAM REPORT   Patient Name:   DANNIEL HAZEL Cuff Date of Exam: 01/18/2021 Medical Rec #:  [id number]             Height:       66.0 in Accession #:    [id number]            Weight:       146.8 lb Date of Birth:  [birth year]             BSA:          1.754 m Patient Age:    79 years              BP:           148/102 mmHg Patient Gender: M                     HR:           127 bpm. Exam Location:  Inpatient Procedure: 2D Echo, Cardiac Doppler and Color Doppler  Results communicated to Dr Darrick Meigs at 16:19 on 01/18/21. Indications:     Syncope  History:         Patient has no prior history of Echocardiogram examinations.                   Risk Factors:Hypertension.  Sonographer:     Luisa Hart RDCS Referring Phys:  [place] Diagnosing Phys: Oswaldo Milian MD IMPRESSIONS  1. Left ventricular ejection fraction, by estimation, is 20 to 25%. The left ventricle has severely decreased function. The left ventricle demonstrates global hypokinesis. There is mild left ventricular hypertrophy. Left ventricular diastolic parameters  are consistent with Grade I diastolic dysfunction (impaired relaxation).  2. Right ventricular systolic function is normal. The right ventricular size is normal.  3. The mitral valve is normal in structure. No evidence of mitral valve regurgitation. No evidence of mitral stenosis.  4. The aortic valve was not well visualized. Aortic valve regurgitation is not visualized. No aortic stenosis is present. FINDINGS  Left Ventricle: Left ventricular ejection fraction, by estimation, is 20 to 25%. The left ventricle has severely decreased function. The left ventricle demonstrates global hypokinesis. The left ventricular internal cavity size was normal in size. There is mild left ventricular hypertrophy. Left ventricular diastolic parameters are consistent with Grade I diastolic dysfunction (impaired relaxation). Right Ventricle: The right ventricular size is normal. No increase in right ventricular wall thickness. Right ventricular systolic function is normal. Left Atrium: Left atrial size was normal in size. Right Atrium: Right atrial size was normal in size. Pericardium: Trivial pericardial effusion is present. Mitral Valve: The mitral valve is normal in structure. No evidence of mitral valve regurgitation. No evidence of mitral valve stenosis. Tricuspid Valve: The tricuspid valve is normal in structure. Tricuspid valve regurgitation is trivial. Aortic Valve: The aortic valve was not well visualized. Aortic valve regurgitation is not visualized. No aortic stenosis is present. Aortic valve mean gradient measures  2.0 mmHg. Aortic valve peak gradient measures 2.8 mmHg. Aortic valve area, by VTI measures 4.22 cm. Pulmonic Valve: The pulmonic valve was grossly normal. Pulmonic valve regurgitation is not visualized. Aorta: The aortic root and ascending aorta are structurally normal, with no evidence of dilitation. IAS/Shunts: The interatrial septum was not well visualized.  LEFT VENTRICLE PLAX 2D LVIDd:         5.20 cm     Diastology LVIDs:         4.40 cm     LV e' medial:    5.55 cm/s LV PW:         1.00 cm     LV E/e' medial:  4.8 LV IVS:        1.00 cm     LV e' lateral:   8.58 cm/s LVOT diam:     2.60 cm     LV E/e' lateral: 3.1 LV SV:         45 LV SV Index:   25 LVOT Area:     5.31 cm  LV Volumes (MOD) LV vol d, MOD A2C: 76.7 ml LV vol d, MOD A4C: 59.7 ml LV vol s, MOD A2C: 59.0 ml LV vol s, MOD A4C: 56.5 ml LV SV MOD A2C:     17.7 ml LV SV MOD A4C:     59.7 ml LV SV MOD BP:      11.8 ml RIGHT VENTRICLE RV S prime:     11.70 cm/s TAPSE (M-mode): 1.7 cm LEFT ATRIUM             Index LA diam:  2.00 cm 1.14 cm/m LA Vol (A2C):   19.0 ml 10.83 ml/m LA Vol (A4C):   11.6 ml 6.61 ml/m LA Biplane Vol: 15.3 ml 8.72 ml/m  AORTIC VALVE                   PULMONIC VALVE AV Area (Vmax):    3.99 cm    PV Vmax:       0.73 m/s AV Area (Vmean):   3.96 cm    PV Vmean:      45.800 cm/s AV Area (VTI):     4.22 cm    PV VTI:        0.080 m AV Vmax:           83.60 cm/s  PV Peak grad:  2.1 mmHg AV Vmean:          56.500 cm/s PV Mean grad:  1.0 mmHg AV VTI:            0.106 m AV Peak Grad:      2.8 mmHg AV Mean Grad:      2.0 mmHg LVOT Vmax:         62.90 cm/s LVOT Vmean:        42.100 cm/s LVOT VTI:          0.084 m LVOT/AV VTI ratio: 0.79  AORTA Ao Root diam: 3.20 cm Ao Asc diam:  3.30 cm MITRAL VALVE               TRICUSPID VALVE MV Area (PHT): 8.25 cm    TR Peak grad:   16.5 mmHg MV Decel Time: 92 msec     TR Vmax:        203.00 cm/s MV E velocity: 26.80 cm/s MV A velocity: 64.50 cm/s  SHUNTS MV E/A ratio:  0.42         Systemic VTI:  0.08 m                            Systemic Diam: 2.60 cm Oswaldo Milian MD Electronically signed by Oswaldo Milian MD Signature Date/Time: 01/18/2021/4:20:18 PM    Final (Updated)       Subjective: No concerns today.  Discharge Exam: Vitals:   02/11/21 1200 02/11/21 1215  BP: 136/85 139/82  Pulse: 89 79  Resp: 18 13  Temp:    SpO2: 93% 96%   Vitals:   02/11/21 1123 02/11/21 1142 02/11/21 1200 02/11/21 1215  BP: (!) 143/80 (!) 144/81 136/85 139/82  Pulse: 95 78 89 79  Resp: (!) '23  18 13  '$ Temp:      TempSrc:      SpO2: 93%  93% 96%  Weight: 64 kg     Height: '5\' 6"'$  (1.676 m)       General: Pt is alert, awake, not in acute distress Cardiovascular: RRR, S1/S2 +, no rubs, no gallops Respiratory: CTA bilaterally, no wheezing, no rhonchi Neuro: minimal hand tremors Extremities: no edema, no cyanosis    The results of significant diagnostics from this hospitalization (including imaging, microbiology, ancillary and laboratory) are listed below for reference.     Microbiology: Recent Results (from the past 240 hour(s))  Resp Panel by RT-PCR (Flu A&B, Covid) Nasopharyngeal Swab     Status: None   Collection Time: 02/10/21  9:37 PM   Specimen: Nasopharyngeal Swab; Nasopharyngeal(NP) swabs in vial transport medium  Result Value Ref Range Status   SARS Coronavirus 2 by  RT PCR NEGATIVE NEGATIVE Final    Comment: (NOTE) SARS-CoV-2 target nucleic acids are NOT DETECTED.  The SARS-CoV-2 RNA is generally detectable in upper respiratory specimens during the acute phase of infection. The lowest concentration of SARS-CoV-2 viral copies this assay can detect is 138 copies/mL. A negative result does not preclude SARS-Cov-2 infection and should not be used as the sole basis for treatment or other patient management decisions. A negative result may occur with  improper specimen collection/handling, submission of specimen other than nasopharyngeal swab, presence  of viral mutation(s) within the areas targeted by this assay, and inadequate number of viral copies(<138 copies/mL). A negative result must be combined with clinical observations, patient history, and epidemiological information. The expected result is Negative.  Fact Sheet for Patients:  EntrepreneurPulse.com.au  Fact Sheet for Healthcare Providers:  IncredibleEmployment.be  This test is no t yet approved or cleared by the [place] FDA and  has been authorized for detection and/or diagnosis of SARS-CoV-2 by FDA under an Emergency Use Authorization (EUA). This EUA will remain  in effect (meaning this test can be used) for the duration of the COVID-19 declaration under Section 564(b)(1) of the Act, 21 U.S.C.section 360bbb-3(b)(1), unless the authorization is terminated  or revoked sooner.       Influenza A by PCR NEGATIVE NEGATIVE Final   Influenza B by PCR NEGATIVE NEGATIVE Final    Comment: (NOTE) The Xpert Xpress SARS-CoV-2/FLU/RSV plus assay is intended as an aid in the diagnosis of influenza from Nasopharyngeal swab specimens and should not be used as a sole basis for treatment. Nasal washings and aspirates are unacceptable for Xpert Xpress SARS-CoV-2/FLU/RSV testing.  Fact Sheet for Patients: EntrepreneurPulse.com.au  Fact Sheet for Healthcare Providers: IncredibleEmployment.be  This test is not yet approved or cleared by the [place] FDA and has been authorized for detection and/or diagnosis of SARS-CoV-2 by FDA under an Emergency Use Authorization (EUA). This EUA will remain in effect (meaning this test can be used) for the duration of the COVID-19 declaration under Section 564(b)(1) of the Act, 21 U.S.C. section 360bbb-3(b)(1), unless the authorization is terminated or revoked.  Performed at [facility], [place] [address], [place], [place] [address]       Labs: BNP (last 3 results) Recent Labs    01/31/21 1655 02/05/21 2002  BNP 31.2 [id number]   Basic Metabolic Panel: Recent Labs  Lab 02/05/21 2001 02/08/21 2124 02/10/21 1509 02/10/21 2137  NA 142 143 139 141  K 3.7 3.9 3.9 3.9  CL 106 104 103 106  CO2 '27 24 23 22  '$ GLUCOSE 96 106* 122* 111*  BUN '6 7 10 10  '$ CREATININE 0.75 0.69 0.64 0.64  CALCIUM 8.4* 9.0 8.5* 8.3*   Liver Function Tests: Recent Labs  Lab 02/05/21 2001 02/08/21 2124 02/10/21 1509 02/10/21 2137  AST 25 32 43* 43*  ALT '19 23 24 24  '$ ALKPHOS 52 62 67 68  BILITOT 0.4 0.6 1.0 0.6  PROT 6.5 7.6 7.7 7.7  ALBUMIN 3.3* 3.8 4.1 4.2   Recent Labs  Lab 02/10/21 2137  LIPASE 43   No results for input(s): AMMONIA in the last 168 hours. CBC: Recent Labs  Lab 02/05/21 2001 02/08/21 2124 02/10/21 1509 02/10/21 2137  WBC 8.1 6.9 5.8 8.6  NEUTROABS 3.5  --   --  6.4  HGB 13.6 16.4 16.5 15.4  HCT 39.5 47.8 45.5 43.7  MCV 96.3 95.4 93.0 94.8  PLT 438* 283 210 181   Cardiac Enzymes:  No results for input(s): CKTOTAL, CKMB, CKMBINDEX, TROPONINI in the last 168 hours. BNP: Invalid input(s): POCBNP CBG: No results for input(s): GLUCAP in the last 168 hours. D-Dimer No results for input(s): DDIMER in the last 72 hours. Hgb A1c No results for input(s): HGBA1C in the last 72 hours. Lipid Profile Recent Labs    02/11/21 0500  CHOL 172  HDL 55  LDLCALC 86  TRIG 156*  CHOLHDL 3.1   Thyroid function studies No results for input(s): TSH, T4TOTAL, T3FREE, THYROIDAB in the last 72 hours.  Invalid input(s): FREET3 Anemia work up No results for input(s): VITAMINB12, FOLATE, FERRITIN, TIBC, IRON, RETICCTPCT in the last 72 hours. Urinalysis    Component Value Date/Time   COLORURINE YELLOW 02/10/2021 2200   APPEARANCEUR CLEAR 02/10/2021 2200   LABSPEC 1.020 02/10/2021 2200   PHURINE 6.0 02/10/2021 2200   GLUCOSEU NEGATIVE 02/10/2021 2200   HGBUR SMALL (A) 02/10/2021 2200   BILIRUBINUR NEGATIVE  02/10/2021 2200   KETONESUR 15 (A) 02/10/2021 2200   PROTEINUR 30 (A) 02/10/2021 2200   NITRITE NEGATIVE 02/10/2021 2200   LEUKOCYTESUR NEGATIVE 02/10/2021 2200   Sepsis Labs Invalid input(s): PROCALCITONIN,  WBC,  LACTICIDVEN Microbiology Recent Results (from the past 240 hour(s))  Resp Panel by RT-PCR (Flu A&B, Covid) Nasopharyngeal Swab     Status: None   Collection Time: 02/10/21  9:37 PM   Specimen: Nasopharyngeal Swab; Nasopharyngeal(NP) swabs in vial transport medium  Result Value Ref Range Status   SARS Coronavirus 2 by RT PCR NEGATIVE NEGATIVE Final    Comment: (NOTE) SARS-CoV-2 target nucleic acids are NOT DETECTED.  The SARS-CoV-2 RNA is generally detectable in upper respiratory specimens during the acute phase of infection. The lowest concentration of SARS-CoV-2 viral copies this assay can detect is 138 copies/mL. A negative result does not preclude SARS-Cov-2 infection and should not be used as the sole basis for treatment or other patient management decisions. A negative result may occur with  improper specimen collection/handling, submission of specimen other than nasopharyngeal swab, presence of viral mutation(s) within the areas targeted by this assay, and inadequate number of viral copies(<138 copies/mL). A negative result must be combined with clinical observations, patient history, and epidemiological information. The expected result is Negative.  Fact Sheet for Patients:  EntrepreneurPulse.com.au  Fact Sheet for Healthcare Providers:  IncredibleEmployment.be  This test is no t yet approved or cleared by the [place] FDA and  has been authorized for detection and/or diagnosis of SARS-CoV-2 by FDA under an Emergency Use Authorization (EUA). This EUA will remain  in effect (meaning this test can be used) for the duration of the COVID-19 declaration under Section 564(b)(1) of the Act, 21 U.S.C.section 360bbb-3(b)(1),  unless the authorization is terminated  or revoked sooner.       Influenza A by PCR NEGATIVE NEGATIVE Final   Influenza B by PCR NEGATIVE NEGATIVE Final    Comment: (NOTE) The Xpert Xpress SARS-CoV-2/FLU/RSV plus assay is intended as an aid in the diagnosis of influenza from Nasopharyngeal swab specimens and should not be used as a sole basis for treatment. Nasal washings and aspirates are unacceptable for Xpert Xpress SARS-CoV-2/FLU/RSV testing.  Fact Sheet for Patients: EntrepreneurPulse.com.au  Fact Sheet for Healthcare Providers: IncredibleEmployment.be  This test is not yet approved or cleared by the [place] FDA and has been authorized for detection and/or diagnosis of SARS-CoV-2 by FDA under an Emergency Use Authorization (EUA). This EUA will remain in effect (meaning this test can be used) for the  duration of the COVID-19 declaration under Section 564(b)(1) of the Act, 21 U.S.C. section 360bbb-3(b)(1), unless the authorization is terminated or revoked.  Performed at [facility], [place] [address] [place], [place] [address]     SIGNED:   Cordelia Poche, MD [facility] 02/11/2021, 2:18 PM

## 2021-02-11 NOTE — Discharge Instructions (Addendum)
Luis Porter,  You were in the hospital because of withdrawal and desire to detox. Since you have changed your mind, I have given you a prescription for Librium. Please, if you decide to resume drinking alcohol, do not use the Librium in addition to consuming alcohol.  [facility] [address] [place], [place] [address] [place], or  [phone]

## 2021-02-11 NOTE — Progress Notes (Signed)
.  Transition of Care [facility]) - Emergency Department Mini Assessment   Patient Details  Name: Luis Porter MRN: [id number] Date of Birth: Mar 04, 1961  [facility]) CM/SW Contact:    Illene Regulus, LCSW Phone Number: 02/11/2021, 1:52 PM   Clinical Narrative:  CSW spoke with pt , pt reported needing to get back to his home to feed his pets. Pt stated he does not know why he came to the hospital, pt reported receiving substance abuse resources last visit. Pt did not report when he plans on going but stated finding one he likes. Pt requested a ride home. CSW to contact transportation for ride home upon d/c.  Arlie Solomons.Koa Palla, MSW, [place]  Transitions of Care Clinical Social Worker I Direct Dial: [phone]  Fax: [phone] [email or web address]    ED Mini Assessment: What brought you to the Emergency Department? : alcohol withdrawal  Barriers to Discharge: No Barriers Identified        Interventions which prevented an admission or readmission: Transportation Screening, Patient counseling    Patient Contact and Communications        ,                 Admission diagnosis:  Alcohol withdrawal delirium ([place]) [F10.231] Patient Active Problem List   Diagnosis Date Noted   Alcohol withdrawal delirium ([place]) 02/10/2021   Postural dizziness with presyncope 01/31/2021   Hypernatremia 01/31/2021   Chronic combined systolic and diastolic heart failure ([place]) 01/31/2021   Syncope    Dilated cardiomyopathy ([place])    Elevated troponin 01/16/2021   Alcohol induced fatty liver 08/04/2020   Chronic alcoholic gastritis without hemorrhage 08/04/2020   Alcohol dependence in early full remission ([place]) 08/04/2020   Angioedema 07/28/2020   Alcohol withdrawal syndrome without complication ([place])    [id number] 02/19/2020   Cough 02/19/2020   Abdominal pain, epigastric    GERD (gastroesophageal reflux disease)  09/19/2019   Acute diverticulitis 09/19/2019   Alcohol abuse 09/19/2019   Essential tremor 07/04/2019   Hx of adenomatous colonic polyps 01/06/2017   Herniated lumbar intervertebral disc 05/18/2016   Mixed hyperlipidemia 05/06/2014   BPH with obstruction/lower urinary tract symptoms 03/31/2014   Insomnia 03/03/2014   Diverticulosis of large intestine without hemorrhage 03/03/2014   Alcohol abuse, daily use 03/03/2014   Essential hypertension 07/08/2011   Hiatal hernia with gastroesophageal reflux 07/07/2011   Lumbago with sciatica 07/07/2011   PCP:  Olin Hauser, DO Pharmacy:   [id number] [place] [place] - [address]Phone: [phone]: [phone]

## 2021-02-11 NOTE — ED Notes (Signed)
Pt A&O x4. Attached to cardiac monitor x3. NAD. VSS.

## 2021-02-18 ENCOUNTER — Other Ambulatory Visit: Payer: Self-pay | Admitting: Family Medicine

## 2021-02-18 DIAGNOSIS — K219 Gastro-esophageal reflux disease without esophagitis: Secondary | ICD-10-CM

## 2021-02-19 ENCOUNTER — Telehealth: Payer: Self-pay | Admitting: Surgery

## 2021-02-19 ENCOUNTER — Emergency Department (HOSPITAL_COMMUNITY): Payer: Commercial Managed Care - PPO

## 2021-02-19 ENCOUNTER — Other Ambulatory Visit: Payer: Self-pay

## 2021-02-19 ENCOUNTER — Emergency Department (HOSPITAL_COMMUNITY)
Admission: EM | Admit: 2021-02-19 | Discharge: 2021-02-19 | Disposition: A | Discharge status: Home / Self Care | Payer: Commercial Managed Care - PPO | Attending: Emergency Medicine | Admitting: Emergency Medicine

## 2021-02-19 ENCOUNTER — Encounter (HOSPITAL_COMMUNITY): Payer: Self-pay | Admitting: *Deleted

## 2021-02-19 DIAGNOSIS — Z20822 Contact with and (suspected) exposure to covid-19: Secondary | ICD-10-CM | POA: Insufficient documentation

## 2021-02-19 DIAGNOSIS — Z87891 Personal history of nicotine dependence: Secondary | ICD-10-CM | POA: Diagnosis not present

## 2021-02-19 DIAGNOSIS — Z79899 Other long term (current) drug therapy: Secondary | ICD-10-CM | POA: Diagnosis not present

## 2021-02-19 DIAGNOSIS — I420 Dilated cardiomyopathy: Secondary | ICD-10-CM | POA: Diagnosis not present

## 2021-02-19 DIAGNOSIS — F10129 Alcohol abuse with intoxication, unspecified: Secondary | ICD-10-CM | POA: Diagnosis not present

## 2021-02-19 DIAGNOSIS — F10929 Alcohol use, unspecified with intoxication, unspecified: Secondary | ICD-10-CM

## 2021-02-19 DIAGNOSIS — I110 Hypertensive heart disease with heart failure: Secondary | ICD-10-CM | POA: Diagnosis not present

## 2021-02-19 DIAGNOSIS — I426 Alcoholic cardiomyopathy: Secondary | ICD-10-CM

## 2021-02-19 DIAGNOSIS — Z8616 Personal history of COVID-19: Secondary | ICD-10-CM | POA: Diagnosis not present

## 2021-02-19 DIAGNOSIS — R079 Chest pain, unspecified: Secondary | ICD-10-CM | POA: Diagnosis present

## 2021-02-19 DIAGNOSIS — I5042 Chronic combined systolic (congestive) and diastolic (congestive) heart failure: Secondary | ICD-10-CM | POA: Diagnosis not present

## 2021-02-19 LAB — CBC WITH DIFFERENTIAL/PLATELET
Abs Immature Granulocytes: 0.01 10*3/uL (ref 0.00–0.07)
Basophils Absolute: 0.1 10*3/uL (ref 0.0–0.1)
Basophils Relative: 2 %
Eosinophils Absolute: 0 10*3/uL (ref 0.0–0.5)
Eosinophils Relative: 0 %
HCT: 40.7 % (ref 39.0–52.0)
Hemoglobin: 14.5 g/dL (ref 13.0–17.0)
Immature Granulocytes: 0 %
Lymphocytes Relative: 31 %
Lymphs Abs: 1.6 10*3/uL (ref 0.7–4.0)
MCH: 33 pg (ref 26.0–34.0)
MCHC: 35.6 g/dL (ref 30.0–36.0)
MCV: 92.7 fL (ref 80.0–100.0)
Monocytes Absolute: 0.7 10*3/uL (ref 0.1–1.0)
Monocytes Relative: 14 %
Neutro Abs: 2.7 10*3/uL (ref 1.7–7.7)
Neutrophils Relative %: 53 %
Platelets: 164 10*3/uL (ref 150–400)
RBC: 4.39 MIL/uL (ref 4.22–5.81)
RDW: 14.3 % (ref 11.5–15.5)
WBC: 5.1 10*3/uL (ref 4.0–10.5)
nRBC: 0 % (ref 0.0–0.2)

## 2021-02-19 LAB — COMPREHENSIVE METABOLIC PANEL
ALT: 23 U/L (ref 0–44)
AST: 56 U/L — ABNORMAL HIGH (ref 15–41)
Albumin: 3.5 g/dL (ref 3.5–5.0)
Alkaline Phosphatase: 64 U/L (ref 38–126)
Anion gap: 12 (ref 5–15)
BUN: 7 mg/dL (ref 6–20)
CO2: 25 mmol/L (ref 22–32)
Calcium: 8.5 mg/dL — ABNORMAL LOW (ref 8.9–10.3)
Chloride: 101 mmol/L (ref 98–111)
Creatinine, Ser: 0.6 mg/dL — ABNORMAL LOW (ref 0.61–1.24)
GFR, Estimated: >60 mL/min (ref >60)
Glucose, Bld: 116 mg/dL — ABNORMAL HIGH (ref 70–99)
Potassium: 3 mmol/L — ABNORMAL LOW (ref 3.5–5.1)
Sodium: 138 mmol/L (ref 135–145)
Total Bilirubin: 0.6 mg/dL (ref 0.3–1.2)
Total Protein: 6.8 g/dL (ref 6.5–8.1)

## 2021-02-19 LAB — RESP PANEL BY RT-PCR (FLU A&B, COVID) ARPGX2
Influenza A by PCR: NEGATIVE
Influenza B by PCR: NEGATIVE
SARS Coronavirus 2 by RT PCR: NEGATIVE

## 2021-02-19 LAB — TROPONIN I (HIGH SENSITIVITY)
Troponin I (High Sensitivity): 16 ng/L (ref <18)
Troponin I (High Sensitivity): 15 ng/L (ref <18)

## 2021-02-19 IMAGING — DX DG CHEST 1V PORT
1 series · 1 of 1 positions shown · non-contrast
Comparison: 02/05/2021

CLINICAL DATA: Chest pain and joint pain. Reported positive COVID
test 3 days ago.

EXAM:
PORTABLE CHEST 1 VIEW

[chest ap]
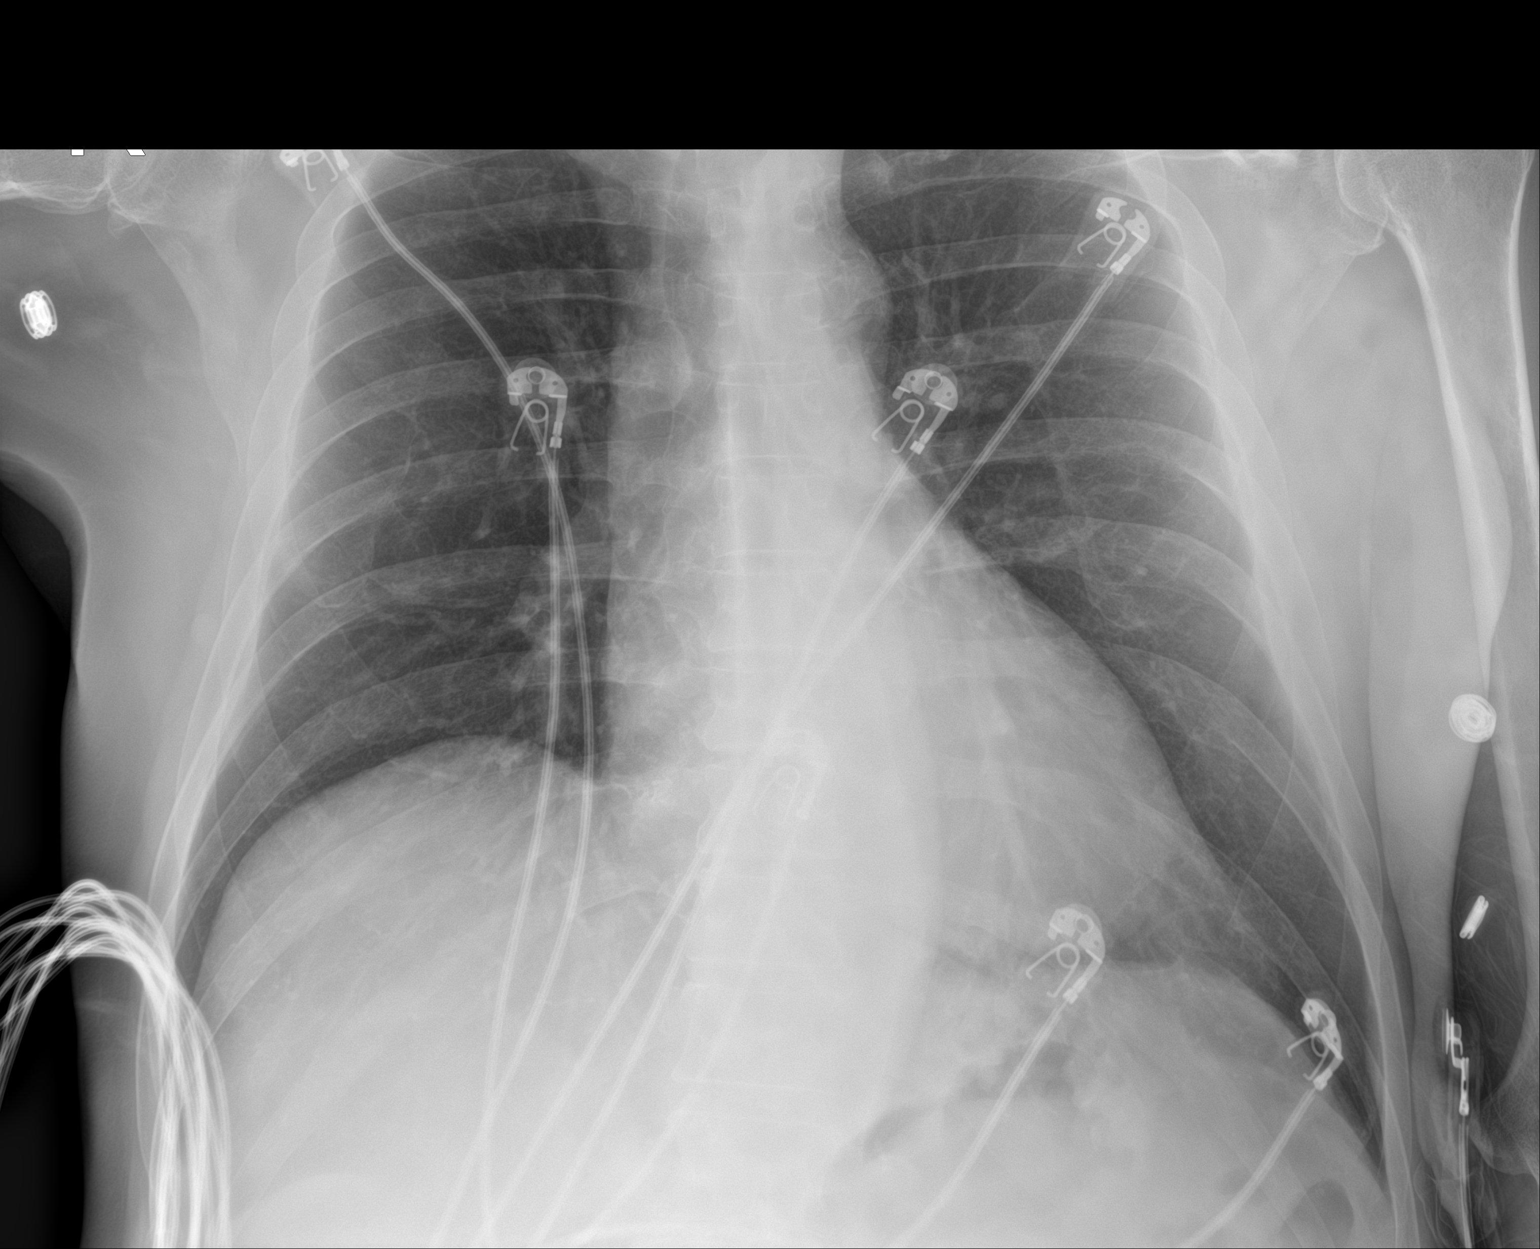

[1 of 1 positions shown; findings below may reference images not displayed]

FINDINGS: Atherosclerotic calcification of the aortic arch. Small hiatal
hernia. Right greater than left degenerative glenohumeral
arthropathy. No significant airspace opacity is identified. No
blunting of the costophrenic angles.
IMPRESSION: 1. The lungs appear clear.
2. Small hiatal hernia.
3. Degenerative glenohumeral arthropathy bilaterally.
4.  Aortic Atherosclerosis ([id number]).

## 2021-02-19 NOTE — ED Triage Notes (Signed)
BIB [facility] from home for chest pain, describes as intermittent, prescribed life vest/ defib vest, battery is dead, not wearing, denies other sx, denies nvd, sob, dizziness, refused ASA, no ntg given "d/t ETOH today", admits to "3 beer, and [place]", ST124, 20g NSL L hand, PCP [facility]. Covid + 3d ago.

## 2021-02-19 NOTE — ED Provider Notes (Signed)
Care of patient assumed at signout.  On my initial evaluation the patient is awake, alert, in no distress.  We facilitated assistance with our cardiology [place].  Patient was initially amenable to awaiting [place] rep evaluation in person.  However, patient signed reviewed, no family history functional test to understand, this was accommodated.  Encouraged to follow-up with cardiology, to continue wearing his LifeVest as instructed.   Carmin Muskrat, MD 02/19/21 2033

## 2021-02-19 NOTE — Progress Notes (Signed)
   Patient presented with complaints of LifeVest malfunction. LifeVest team to assess this evening and will discuss findings/concerns with cardiology team. Will hold off on a cardiology consult at his time.   I have updated our evening cardiology coverage who will follow-up as above.   Abigail Butts, PA-C 02/19/21; 5:17 PM

## 2021-02-19 NOTE — Discharge Instructions (Addendum)
Is very important you follow-up with your cardiology team.  Return here for concerning changes in your condition.

## 2021-02-19 NOTE — Telephone Encounter (Signed)
[employer] transportation arranged to transport patient home.  Melburn Popper driver will contact patient at number he provided.

## 2021-02-19 NOTE — ED Notes (Signed)
Pt spoke with [employer] life vest personnel who contacted via phone and walked him through putting back on his life vest, which was done successfully.

## 2021-02-19 NOTE — ED Provider Notes (Signed)
[facility] EMERGENCY DEPARTMENT Provider Note   CSN: [id number] Arrival date & time: 02/19/21  1422     History Chief Complaint  Patient presents with   Chest Pain    Luis Porter is a 60 y.o. male.  Pt is a 60 yo male with pmh of HTN, alcohol abuse, alcohol related cardiomyopathy, and systolic HF presenting for chest pain.  Pt admits to chest pain, non radiating, sternally located, squeezing, intermittent, occurring at rest during sleep. Denies fevers, chills, or coughing. Denies lower extremity swelling. Chart review demonstrates echocardiogram august 2022 with EF of 20-25 % with global hypokinesis with heart cath completed.    Presents with Life Vest unplugged from battery back and off of chest. States "I unplugged it last week because it wouldn't stop beeping". Admits to alcohol consumption prior to arrival including several beers and shots.   The history is provided by the patient. No language interpreter was used.  Chest Pain Associated symptoms: no abdominal pain, no back pain, no cough, no fever, no palpitations, no shortness of breath and no vomiting       Past Medical History:  Diagnosis Date   Chronic systolic heart failure ([place])    felt to be alcohol-related   Diverticulosis    Gastric ulcer    GERD (gastroesophageal reflux disease)    History of hiatal hernia    Hypertension    Iron deficiency anemia 10/18/2019   Recurrent umbilical hernia with incarceration 11/08/2016   Previously repaired x 1    Patient Active Problem List   Diagnosis Date Noted   Alcohol withdrawal delirium ([place]) 02/10/2021   Postural dizziness with presyncope 01/31/2021   Hypernatremia 01/31/2021   Chronic combined systolic and diastolic heart failure ([place]) 01/31/2021   Syncope    Dilated cardiomyopathy ([place])    Elevated troponin 01/16/2021   Alcohol induced fatty liver 08/04/2020   Chronic alcoholic gastritis without hemorrhage 08/04/2020   Alcohol  dependence in early full remission ([place]) 08/04/2020   Angioedema 07/28/2020   Alcohol withdrawal syndrome without complication ( [place])    [id number] 02/19/2020   Cough 02/19/2020   Abdominal pain, epigastric    GERD (gastroesophageal reflux disease) 09/19/2019   Acute diverticulitis 09/19/2019   Alcohol abuse 09/19/2019   Essential tremor 07/04/2019   Hx of adenomatous colonic polyps 01/06/2017   Herniated lumbar intervertebral disc 05/18/2016   Mixed hyperlipidemia 05/06/2014   BPH with obstruction/lower urinary tract symptoms 03/31/2014   Insomnia 03/03/2014   Diverticulosis of large intestine without hemorrhage 03/03/2014   Alcohol abuse, daily use 03/03/2014   Essential hypertension 07/08/2011   Hiatal hernia with gastroesophageal reflux 07/07/2011   Lumbago with sciatica 07/07/2011    Past Surgical History:  Procedure Laterality Date   BACK SURGERY     COLONOSCOPY WITH PROPOFOL N/A 12/30/2016   Procedure: COLONOSCOPY WITH PROPOFOL;  Surgeon: [name], MD;  Location: [facility] ENDOSCOPY;  Service: Endoscopy;  Laterality: N/A;   COLONOSCOPY WITH PROPOFOL N/A 10/31/2019   Procedure: COLONOSCOPY WITH PROPOFOL;  Surgeon: Lin Landsman, MD;  Location: [facility] ENDOSCOPY;  Service: Gastroenterology;  Laterality: N/A;   ESOPHAGOGASTRODUODENOSCOPY (EGD) WITH PROPOFOL N/A 12/30/2016   Procedure: ESOPHAGOGASTRODUODENOSCOPY (EGD) WITH PROPOFOL;  Surgeon: [name], MD;  Location: [facility] ENDOSCOPY;  Service: Endoscopy;  Laterality: N/A;   ESOPHAGOGASTRODUODENOSCOPY (EGD) WITH PROPOFOL N/A 10/31/2019   Procedure: ESOPHAGOGASTRODUODENOSCOPY (EGD) WITH PROPOFOL;  Surgeon: Lin Landsman, MD;  Location: [facility] ENDOSCOPY;  Service: Gastroenterology;  Laterality: N/A;   HERNIA REPAIR  2002  Umbilical Hernia Repair   LUMBAR LAMINECTOMY/DECOMPRESSION MICRODISCECTOMY Right 05/18/2016   Procedure: right L4-5 microdiscectomy;  Surgeon: Blanche East, MD;  Location: [facility] ORS;  Service: Neurosurgery;   Laterality: Right;   RIGHT/LEFT HEART CATH AND CORONARY ANGIOGRAPHY N/A 01/20/2021   Procedure: RIGHT/LEFT HEART CATH AND CORONARY ANGIOGRAPHY;  Surgeon: Troy Sine, MD;  Location: [place] CV LAB;  Service: Cardiovascular;  Laterality: N/A;   TONSILLECTOMY     UMBILICAL HERNIA REPAIR N/A 06/29/2018   Procedure: LAPAROSCOPIC REPAIR OF RECURRENT UMBILICAL HERNIA WITH MESH;  Surgeon: Vickie Epley, MD;  Location: [facility] ORS;  Service: General;  Laterality: N/A;       Family History  Problem Relation Age of Onset   Hypertension Mother    Cancer Mother        Ovarian   Cervical cancer Mother    Hypertension Father    Cancer Father        tongue   Throat cancer Father    Hypertension Brother    Prostate cancer Neg Hx    Breast cancer Neg Hx    Colon cancer Neg Hx    Heart attack Neg Hx    Stroke Neg Hx     Social History   Tobacco Use   Smoking status: Former    Packs/day: 2.00    Years: 25.00    Pack years: 50.00    Types: Cigarettes    Quit date: 05/31/1987    Years since quitting: 33.7   Smokeless tobacco: Never  Vaping Use   Vaping Use: Never used  Substance Use Topics   Alcohol use: Yes    Alcohol/week: 14.0 standard drinks    Types: 14 Cans of beer per week    Comment: former usage. States he drinks around 2 beer a day   Drug use: No    Home Medications Prior to Admission medications   Medication Sig Start Date End Date Taking? Authorizing Provider  albuterol (VENTOLIN HFA) 108 (90 Base) MCG/ACT inhaler INHALE 1-2 PUFFS INTO THE LUNGS EVERY 4 (FOUR) HOURS AS NEEDED FOR WHEEZING OR SHORTNESS OF BREATH. 04/11/20   Karamalegos, Alexander J, DO  fluticasone (FLONASE) 50 MCG/ACT nasal spray PLACE 2 SPRAYS INTO BOTH NOSTRILS DAILY. USE FOR 4-6 WEEKS THEN STOP AND USE SEASONALLY OR AS NEEDED. Patient taking differently: Place 2 sprays into both nostrils daily as needed for allergies. 07/20/20   Karamalegos, [name], DO  folic acid (FOLVITE) 1 MG tablet Take 1  tablet (1 mg total) by mouth daily. 01/27/21   Karamalegos, [name], DO  hydrOXYzine (ATARAX/VISTARIL) 25 MG tablet Take 1 tablet (25 mg total) by mouth every 6 (six) hours as needed for anxiety (or CIWA score </= 10). 01/21/21   Raiford Noble Latif, DO  loratadine (CLARITIN) 10 MG tablet Take 10 mg by mouth daily as needed for allergies or rhinitis.    [provider]  meclizine (ANTIVERT) 25 MG tablet Take 25 mg by mouth 3 (three) times daily as needed for dizziness.    [provider]  metoprolol succinate (TOPROL-XL) 100 MG 24 hr tablet Take 1 tablet (100 mg total) by mouth daily. Take with or immediately following a meal. 02/02/21   Dwyane Dee, MD  MIDOL COMPLETE 500-60-15 MG TABS Take 2 tablets by mouth 3 (three) times a week.    [provider]  multivitamin (ONE-A-DAY MEN'S) TABS tablet Take 1 tablet by mouth daily with breakfast.    [provider]  omeprazole (PRILOSEC) 40 MG  capsule TAKE 1 CAPSULE BY MOUTH TWICE A DAY 02/18/21   Karamalegos, Alexander J, DO  ondansetron (ZOFRAN) 4 MG tablet Take 1 tablet (4 mg total) by mouth every 6 (six) hours as needed for nausea. Patient not taking: Reported on 02/10/2021 01/21/21   Raiford Noble Latif, DO  ondansetron (ZOFRAN-ODT) 4 MG disintegrating tablet Take 4 mg by mouth every 8 (eight) hours as needed for nausea or vomiting (dissolve orally).    [provider]  sacubitril-valsartan (ENTRESTO) 24-26 MG Take 1 tablet by mouth 2 (two) times daily. 01/21/21   Raiford Noble Latif, DO  spironolactone (ALDACTONE) 25 MG tablet Take 1 tablet (25 mg total) by mouth daily. 01/26/21   Katherine Roan, MD  thiamine 100 MG tablet Take 1 tablet (100 mg total) by mouth daily. 01/27/21   Karamalegos, [name], DO    Allergies    Benazepril, Naltrexone, and Nsaids  Review of Systems   Review of Systems  Constitutional:  Negative for chills and fever.  HENT:  Negative for ear pain and sore throat.   Eyes:   Negative for pain and visual disturbance.  Respiratory:  Negative for cough and shortness of breath.   Cardiovascular:  Positive for chest pain. Negative for palpitations.  Gastrointestinal:  Negative for abdominal pain and vomiting.  Genitourinary:  Negative for dysuria and hematuria.  Musculoskeletal:  Negative for arthralgias and back pain.  Skin:  Negative for color change and rash.  Neurological:  Negative for seizures and syncope.  All other systems reviewed and are negative.  Physical Exam Updated Vital Signs SpO2 96%   Physical Exam Vitals and nursing note reviewed.  Constitutional:      Appearance: He is well-developed.  HENT:     Head: Normocephalic and atraumatic.  Eyes:     Conjunctiva/sclera: Conjunctivae normal.  Cardiovascular:     Rate and Rhythm: Normal rate and regular rhythm.     Heart sounds: No murmur heard. Pulmonary:     Effort: Pulmonary effort is normal. No respiratory distress.     Breath sounds: Normal breath sounds.  Abdominal:     Palpations: Abdomen is soft.     Tenderness: There is no abdominal tenderness.  Musculoskeletal:     Cervical back: Neck supple.  Skin:    General: Skin is warm and dry.  Neurological:     Mental Status: He is alert.     GCS: GCS eye subscore is 4. GCS verbal subscore is 5. GCS motor subscore is 6.    ED Results / Procedures / Treatments   Labs (all labs ordered are listed, but only abnormal results are displayed) Labs Reviewed  CBC WITH DIFFERENTIAL/PLATELET  COMPREHENSIVE METABOLIC PANEL  TROPONIN I (HIGH SENSITIVITY)    EKG None  Radiology No results found.  Procedures Procedures   Medications Ordered in ED Medications - No data to display  ED Course  I have reviewed the triage vital signs and the nursing notes.  Pertinent labs & imaging results that were available during my care of the patient were reviewed by me and considered in my medical decision making (see chart for details).    MDM  Rules/Calculators/A&P  4:25 PM 60 yo male with pmh of HTN, alcohol abuse, alcohol related cardiomyopathy, and systolic HF presenting for chest pain with life vest siting at bedside unplugged. Pt states "I unplugged it last week because it wouldn't stop beeping".  ECG stable with no ST segment changes. Laboratory studies pending. Consult to cards placed  for discussion regarding life vest. Pt signed out to oncoming provider.    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

## 2021-02-25 ENCOUNTER — Telehealth: Payer: Self-pay

## 2021-02-25 NOTE — Telephone Encounter (Signed)
Copied from [place] [id number]. Topic: General - Other >> Feb 25, 2021 12:36 PM Camille Bal, Gerlene Burdock wrote: Reason for TGY:[id number] called in about status of paperwork from [place] for fmla he left on 09/14 to be filled out. Please call

## 2021-02-26 NOTE — Telephone Encounter (Signed)
I do not see the FMLA forms up front or scanned under the media tab. Will need to wait for Dr. Parks Ranger return.

## 2021-02-28 ENCOUNTER — Other Ambulatory Visit: Payer: Self-pay | Admitting: Family Medicine

## 2021-02-28 DIAGNOSIS — I10 Essential (primary) hypertension: Secondary | ICD-10-CM

## 2021-02-28 NOTE — Telephone Encounter (Signed)
Called [facility] and spoke to [facility]. Advised amlodipine was dc'd 01/21/21. Stanton Kidney stated she will pass on to the pharmacist.

## 2021-03-01 NOTE — Telephone Encounter (Signed)
I don't have any FMLA forms from that date.  Last phone call already documented on FMLA was 02/03/21 and I spoke with Bebe Liter and they did not require any additional FMLA paperwork from me at that time.  If this is in request to any new updates - with multiple ED visits since 02/10/21 then he will need to submit new paperwork and give me more accurate information on what the timeline is and reasoning for FMLA.  At this point, I am not sure which days he has missed from work and how long he is has been out.  Nobie Putnam, [facility] 03/01/2021, 9:56 AM

## 2021-03-01 NOTE — Progress Notes (Deleted)
Cardiology Office Note:    Date:  03/01/2021   ID:  Luis Porter, DOB Mar 14, 1961, MRN [id number]  PCP:  Olin Hauser, DO   [facility] Providers Cardiologist:  Fransico Him, MD { Click to update primary MD,subspecialty MD or APP then REFRESH:1}    Referring MD: Katherine Roan, MD   Follow up for Dilated Cardiomyopathy  History of Present Illness:    Luis Porter is a 60 y.o. 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  He underwent cardiac catheterization 01/20/2021 which showed low right heart pressures, EF 20-25%, normal large coronary arteries with a left trifurcating into an LAD, large ramus intermedius vessel, and large circumflex vessel, normal dominant RCA.  Findings consistent with nonischemic cardiomyopathy due to significant EtOH use.  He presented to the emergency department on 02/19/2021 with complaints of chest discomfort.  He described it as intermittent, squeezing, nonradiating, substernal and occurring at rest.  He denied fever, chills, and cough.  He denied lower extremity swelling.  His echocardiogram 8/22 showed an EF of 20-25% with global hypokinesis.  His LifeVest was unplugged.  He reported he unplugged the device the prior week because it would not stop beeping.  He admitted to alcohol consumption prior to arrival which included several beers and shots.  His EKG showed no ST segment changes.  Chest x-ray showed no acute changes/abnormalities.  Respiratory virus panel was negative and his troponins were 15.  Cardiology was consulted.  No plan for ischemic evaluation was made at that time.  He was discharged in stable condition at 2055 on 02/19/2021.  He presents to the clinic today for follow-up evaluation states***  *** denies chest  pain, shortness of breath, lower extremity edema, fatigue, palpitations, melena, hematuria, hemoptysis, diaphoresis, weakness, presyncope, syncope, orthopnea, and PND.   Past Medical History:  Diagnosis Date   Chronic systolic heart failure ([place])    felt to be alcohol-related   Diverticulosis    Gastric ulcer    GERD (gastroesophageal reflux disease)    History of hiatal hernia    Hypertension    Iron deficiency anemia 10/18/2019   Recurrent umbilical hernia with incarceration 11/08/2016   Previously repaired x 1    Past Surgical History:  Procedure Laterality Date   BACK SURGERY     COLONOSCOPY WITH PROPOFOL N/A 12/30/2016   Procedure: COLONOSCOPY WITH PROPOFOL;  Surgeon: [name], MD;  Location: [facility] ENDOSCOPY;  Service: Endoscopy;  Laterality: N/A;   COLONOSCOPY WITH PROPOFOL N/A 10/31/2019   Procedure: COLONOSCOPY WITH PROPOFOL;  Surgeon: Lin Landsman, MD;  Location: [facility] ENDOSCOPY;  Service: Gastroenterology;  Laterality: N/A;   ESOPHAGOGASTRODUODENOSCOPY (EGD) WITH PROPOFOL N/A 12/30/2016   Procedure: ESOPHAGOGASTRODUODENOSCOPY (EGD) WITH PROPOFOL;  Surgeon: [name], MD;  Location: [facility] ENDOSCOPY;  Service: Endoscopy;  Laterality: N/A;   ESOPHAGOGASTRODUODENOSCOPY (EGD) WITH PROPOFOL N/A 10/31/2019   Procedure: ESOPHAGOGASTRODUODENOSCOPY (EGD) WITH PROPOFOL;  Surgeon: Lin Landsman, MD;  Location: [facility] ENDOSCOPY;  Service: Gastroenterology;  Laterality: N/A;   HERNIA REPAIR  2330   Umbilical Hernia Repair   LUMBAR LAMINECTOMY/DECOMPRESSION MICRODISCECTOMY Right 05/18/2016   Procedure: right L4-5 microdiscectomy;  Surgeon: Blanche East, MD;  Location: [facility] ORS;  Service: Neurosurgery;  Laterality: Right;   RIGHT/LEFT HEART CATH AND CORONARY ANGIOGRAPHY N/A 01/20/2021   Procedure: RIGHT/LEFT HEART CATH AND CORONARY ANGIOGRAPHY;  Surgeon: Troy Sine, MD;  Location: [place] CV LAB;  Service: Cardiovascular;  Laterality: N/A;   TONSILLECTOMY      UMBILICAL HERNIA REPAIR N/A 06/29/2018   Procedure: LAPAROSCOPIC REPAIR OF RECURRENT UMBILICAL HERNIA WITH MESH;  Surgeon: Vickie Epley, MD;  Location: [facility] ORS;  Service: General;  Laterality: N/A;    Current Medications: No outpatient medications have been marked as taking for the 03/03/21 encounter (Appointment) with Deberah Pelton, NP.     Allergies:   Benazepril, Naltrexone, and Nsaids   Social History   Socioeconomic History   Marital status: Divorced    Spouse name: Not on file   Number of children: Not on file   Years of education: Not on file   Highest education level: Not on file  Occupational History   Not on file  Tobacco Use   Smoking status: Former    Packs/day: 2.00    Years: 25.00    Pack years: 50.00    Types: Cigarettes    Quit date: 05/31/1987    Years since quitting: 33.7   Smokeless tobacco: Never  Vaping Use   Vaping Use: Never used  Substance and Sexual Activity   Alcohol use: Yes    Alcohol/week: 14.0 standard drinks    Types: 14 Cans of beer per week    Comment: former usage. States he drinks around 2 beer a day   Drug use: No   Sexual activity: Yes    Comment: parners birth control  Other Topics Concern   Not on file  Social History Narrative   Not on file   Social Determinants of Health   Financial Resource Strain: Low Risk    Difficulty of Paying Living Expenses: Not very hard  Food Insecurity: No Food Insecurity   Worried About Charity fundraiser in the Last Year: Never true   Ran Out of Food in the Last Year: Never true  Transportation Needs: No Transportation Needs   Lack of Transportation (Medical): No   Lack of Transportation (Non-Medical): No  Physical Activity: Not on file  Stress: No Stress Concern Present   Feeling of Stress : Only a little  Social Connections: Unknown   Frequency of Communication with Friends and Family: Three times a week   Frequency of Social Gatherings with Friends and Family: Three times a week    Attends Religious Services: 1 to 4 times per year   Active Member of Clubs or Organizations: No   Attends Archivist Meetings: Never   Marital Status: Not on file     Family History: The patient's ***family history includes Cancer in his father and mother; Cervical cancer in his mother; Hypertension in his brother, father, and mother; Throat cancer in his father. There is no history of Prostate cancer, Breast cancer, Colon cancer, Heart attack, or Stroke.  ROS:   Please see the history of present illness.    *** All other systems reviewed and are negative.   Risk Assessment/Calculations:   {Does this patient have ATRIAL FIBRILLATION?:[phone]}       Physical Exam:    VS:  There were no vitals taken for this visit.    Wt Readings from Last 3 Encounters:  02/19/21 150 lb (68 kg)  02/11/21 141 lb (64 kg)  02/10/21 141 lb 1.5 oz (64 kg)     GEN: *** Well nourished, well developed in no acute distress HEENT: Normal NECK: No JVD; No carotid bruits LYMPHATICS: No lymphadenopathy CARDIAC: ***RRR, no murmurs, rubs, gallops RESPIRATORY:  Clear to auscultation without rales, wheezing or rhonchi  ABDOMEN: Soft, non-tender, non-distended MUSCULOSKELETAL:  No edema; No deformity  SKIN: Warm and dry NEUROLOGIC:  Alert and oriented x 3 PSYCHIATRIC:  Normal affect    EKGs/Labs/Other Studies Reviewed:    The following studies were reviewed today:  Echocardiogram 01/18/2021  IMPRESSIONS     1. Left ventricular ejection fraction, by estimation, is 20 to 25%. The  left ventricle has severely decreased function. The left ventricle  demonstrates global hypokinesis. There is mild left ventricular  hypertrophy. Left ventricular diastolic parameters   are consistent with Grade I diastolic dysfunction (impaired relaxation).   2. Right ventricular systolic function is normal. The right ventricular  size is normal.   3. The mitral valve is normal in structure. No evidence  of mitral valve  regurgitation. No evidence of mitral stenosis.   4. The aortic valve was not well visualized. Aortic valve regurgitation  is not visualized. No aortic stenosis is present.  Cardiac catheterization 01/20/2021  Normal large epicardial coronary arteries with a left main trifurcating into an LAD, large ramus intermediate vessel, and left circumflex vessel.  Normal dominant RCA.   Low right heart pressures.   In this patient with EF at 20 to 25%, findings are consistent with a nonischemic cardiomyopathy.  With his history of significant EtOH use, consider alcohol etiology.   RECOMMENATION: Guideline directed medical therapy for HFrEF as blood pressure allows.  Consider possible arrhythmic etiology to syncope versus hypotension.  He has a ready for Mannam sinus node  Diagnostic Dominance: Right Intervention    EKG:  EKG is *** ordered today.  The ekg ordered today demonstrates ***  Recent Labs: 01/21/2021: TSH 2.046 02/01/2021: Magnesium 1.7 02/05/2021: B Natriuretic Peptide 116.0 02/19/2021: ALT 23; BUN 7; Creatinine, Ser 0.60; Hemoglobin 14.5; Platelets 164; Potassium 3.0; Sodium 138  Recent Lipid Panel    Component Value Date/Time   CHOL 172 02/11/2021 0500   TRIG 156 (H) 02/11/2021 0500   HDL 55 02/11/2021 0500   CHOLHDL 3.1 02/11/2021 0500   VLDL 31 02/11/2021 0500   LDLCALC 86 02/11/2021 0500   LDLCALC 131 (H) 06/04/2018 0953    ASSESSMENT & PLAN    Dilated cardiomyopathy-no increased DOE or activity intolerance.  NYHA class II.  Weight stable.  Echocardiogram 01/18/2021 showed an EF of 20-25% with global hypokinesis, G1 DD, and no significant valvular abnormalities.  Felt to be secondary to alcohol abuse.  Normal coronary anatomy with cardiac catheterization 01/20/2021. Continue increase Entresto 49-51 Continue metoprolol, spironolactone.  Now compliant with LifeVest.  Has returned to regular heavy alcohol consumption. Heart healthy low-sodium diet-salty 6  given Increase physical activity as tolerated Recommended alcohol cessation Repeat BMP in 1 week  Chronic systolic CHF-euvolemic.  Reports daily weights.  Remains somewhat physically active with intermittent yard work and outdoor activities. Continue increase Entresto 49-51 Continue  metoprolol, spironolactone Heart healthy low-sodium diet-salty 6 given Increase physical activity as tolerated   Syncope-denies further episodes Continue to monitor Maintain p.o. hydration  Alcohol abuse-reports return to regular heavy alcohol consumption. Alcohol cessation recommended.  Pathophysiology of alcohol abuse discussed. Alcohol cessation resources provided  Disposition: Follow-up in 1 month  {Are you ordering a CV Procedure (e.g. stress test, cath, DCCV, TEE, etc)?   Press F2        :[id number]}    Medication Adjustments/Labs and Tests Ordered: Current medicines are reviewed at length with the patient today.  Concerns regarding medicines are outlined above.  No orders of  the defined types were placed in this encounter.  No orders of the defined types were placed in this encounter.   There are no Patient Instructions on file for this visit.   Signed, Deberah Pelton, NP  03/01/2021 6:38 AM      Notice: This dictation was prepared with Dragon dictation along with smaller phrase technology. Any transcriptional errors that result from this process are unintentional and may not be corrected upon review.  I spent***minutes examining this patient, reviewing medications, and using patient centered shared decision making involving her cardiac care.  Prior to her visit I spent greater than 20 minutes reviewing her past medical history,  medications, and prior cardiac tests.

## 2021-03-02 ENCOUNTER — Other Ambulatory Visit: Payer: Self-pay | Admitting: Family Medicine

## 2021-03-02 DIAGNOSIS — K219 Gastro-esophageal reflux disease without esophagitis: Secondary | ICD-10-CM

## 2021-03-02 NOTE — Telephone Encounter (Signed)
Requested Prescriptions  Pending Prescriptions Disp Refills  . omeprazole (PRILOSEC) 40 MG capsule [Pharmacy Med Name: OMEPRAZOLE DR 40 MG CAPSULE] 180 capsule 1    Sig: TAKE 1 CAPSULE BY MOUTH TWICE A DAY     Gastroenterology: Proton Pump Inhibitors Passed - 03/02/2021 12:16 PM      Passed - Valid encounter within last 12 months    Recent Outpatient Visits          2 weeks ago Alcohol withdrawal syndrome with complication [facility])   [place], DO   1 month ago Dilated cardiomyopathy [facility])   [place], DO   2 months ago Acute diverticulitis   [place], DO   3 months ago Hiatal hernia with gastroesophageal reflux   [place], DO   7 months ago Chronic alcoholic gastritis without hemorrhage   [facility], [name], DO      Future Appointments            Tomorrow Marilynn Rail, Jossie Ng, NP [facility], [place]   In 1 month [place], Tally Due, MD [place]

## 2021-03-03 ENCOUNTER — Ambulatory Visit (HOSPITAL_BASED_OUTPATIENT_CLINIC_OR_DEPARTMENT_OTHER): Payer: Commercial Managed Care - PPO | Admitting: General Practice

## 2021-03-04 ENCOUNTER — Ambulatory Visit (INDEPENDENT_AMBULATORY_CARE_PROVIDER_SITE_OTHER): Payer: Commercial Managed Care - PPO | Admitting: Cardiology

## 2021-03-04 ENCOUNTER — Encounter: Payer: Self-pay | Admitting: Cardiology

## 2021-03-04 ENCOUNTER — Other Ambulatory Visit: Payer: Self-pay

## 2021-03-04 ENCOUNTER — Other Ambulatory Visit (HOSPITAL_COMMUNITY): Payer: Self-pay | Admitting: *Deleted

## 2021-03-04 VITALS — BP 140/82 | HR 118 | Ht 66.0 in | Wt 147.8 lb

## 2021-03-04 DIAGNOSIS — I420 Dilated cardiomyopathy: Secondary | ICD-10-CM | POA: Diagnosis not present

## 2021-03-04 DIAGNOSIS — F1010 Alcohol abuse, uncomplicated: Secondary | ICD-10-CM | POA: Diagnosis not present

## 2021-03-04 DIAGNOSIS — R55 Syncope and collapse: Secondary | ICD-10-CM

## 2021-03-04 DIAGNOSIS — I5022 Chronic systolic (congestive) heart failure: Secondary | ICD-10-CM

## 2021-03-04 NOTE — Progress Notes (Signed)
PCP: Nobie Putnam Primary Cardiologist: Fransico Him  HPI:  Luis Porter is a 60 y.o.  male  with a PMH significant for severe alcohol use disorder, alcoholic fatty liver disease, HTN, Tobacco use disorder  Patient was admitted for syncope evaluation to the medicine service.  Still drinking heavily at least 12 pack of beer per day also some liquor on top of this.  EKG without acute ischemia, troponin flat 29 > 28.  Echo 01/18/2021 EF 20 to 25% global hypokinesis, G1 DD, normal RV function, valves okay.  Given depressed EF he was taken for right and left heart catheterization which demonstrated normal coronary arteries.  RHC results  [facility] RA: 1 RV: 12/1 PA: 14/3; mean 7 PW: A-wave 3; mean 1 AO:107/73 LV: 105/3  Fick method, cardiac output 3.1 L/min with a cardiac index of 1.7 L/min/m.  GDMT titrated during admission discharged on Toprol-XL 50 daily, Entresto 24/26 twice daily, Spiro 12.5 mg daily.  Given his presentation of syncope and severely depressed LVEF he was placed on a LifeVest before discharge.  He is here today for followup and is doing well.  He still has occasional SOB and will have some mild tightness with it.  He does no do much exertion due to weakness. He will feels his heart racing at times and will feel dizzy at times as well.  He denies any PND, orthopnea, LE edema,  or syncope. He is compliant with his meds and is tolerating meds with no SE.     ROS: All systems negative except as listed in HPI, PMH and Problem List.  SH:  Social History   Socioeconomic History   Marital status: Divorced    Spouse name: Not on file   Number of children: Not on file   Years of education: Not on file   Highest education level: Not on file  Occupational History   Not on file  Tobacco Use   Smoking status: Former    Packs/day: 2.00    Years: 25.00    Pack years: 50.00    Types: Cigarettes    Quit date: 05/31/1987    Years since quitting: 33.7    Smokeless tobacco: Never  Vaping Use   Vaping Use: Never used  Substance and Sexual Activity   Alcohol use: Yes    Alcohol/week: 14.0 standard drinks    Types: 14 Cans of beer per week    Comment: former usage. States he drinks around 2 beer a day   Drug use: No   Sexual activity: Yes    Comment: parners birth control  Other Topics Concern   Not on file  Social History Narrative   Not on file   Social Determinants of Health   Financial Resource Strain: Low Risk    Difficulty of Paying Living Expenses: Not very hard  Food Insecurity: No Food Insecurity   Worried About Charity fundraiser in the Last Year: Never true   Ran Out of Food in the Last Year: Never true  Transportation Needs: No Transportation Needs   Lack of Transportation (Medical): No   Lack of Transportation (Non-Medical): No  Physical Activity: Not on file  Stress: No Stress Concern Present   Feeling of Stress : Only a little  Social Connections: Unknown   Frequency of Communication with Friends and Family: Three times a week   Frequency of Social Gatherings with Friends and Family: Three times a week   Attends Religious Services: 1 to 4  times per year   Active Member of Clubs or Organizations: No   Attends Archivist Meetings: Never   Marital Status: Not on file  Intimate Partner Violence: Not At Risk   Fear of Current or Ex-Partner: No   Emotionally Abused: No   Physically Abused: No   Sexually Abused: No    FH:  Family History  Problem Relation Age of Onset   Hypertension Mother    Cancer Mother        Ovarian   Cervical cancer Mother    Hypertension Father    Cancer Father        tongue   Throat cancer Father    Hypertension Brother    Prostate cancer Neg Hx    Breast cancer Neg Hx    Colon cancer Neg Hx    Heart attack Neg Hx    Stroke Neg Hx     Past Medical History:  Diagnosis Date   Chronic systolic heart failure ([place])    felt to be alcohol-related   Diverticulosis     Gastric ulcer    GERD (gastroesophageal reflux disease)    History of hiatal hernia    Hypertension    Iron deficiency anemia 10/18/2019   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 6.7 each 1   folic acid (FOLVITE) 1 MG tablet Take 1 tablet (1 mg total) by mouth daily. 90 tablet 3   hydrOXYzine (ATARAX/VISTARIL) 25 MG tablet Take 1 tablet (25 mg total) by mouth every 6 (six) hours as needed for anxiety (or CIWA score </= 10). 30 tablet 0   loratadine (CLARITIN) 10 MG tablet Take 10 mg by mouth daily as needed for allergies or rhinitis.     meclizine (ANTIVERT) 25 MG tablet Take 25 mg by mouth 3 (three) times daily as needed for dizziness.     metoprolol succinate (TOPROL-XL) 100 MG 24 hr tablet Take 1 tablet (100 mg total) by mouth daily. Take with or immediately following a meal. 30 tablet 3   MIDOL COMPLETE 500-60-15 MG TABS Take 2 tablets by mouth 3 (three) times a week.     multivitamin (ONE-A-DAY MEN'S) TABS tablet Take 1 tablet by mouth daily with breakfast.     omeprazole (PRILOSEC) 40 MG capsule TAKE 1 CAPSULE BY MOUTH TWICE A DAY 180 capsule 1   ondansetron (ZOFRAN) 4 MG tablet Take 1 tablet (4 mg total) by mouth every 6 (six) hours as needed for nausea. 20 tablet 0   ondansetron (ZOFRAN-ODT) 4 MG disintegrating tablet Take 4 mg by mouth every 8 (eight) hours as needed for nausea or vomiting (dissolve orally).     sacubitril-valsartan (ENTRESTO) 24-26 MG Take 1 tablet by mouth 2 (two) times daily. 60 tablet 0   spironolactone (ALDACTONE) 25 MG tablet Take 1 tablet (25 mg total) by mouth daily. 30 tablet 1   thiamine 100 MG tablet Take 1 tablet (100 mg total) by mouth daily. 90 tablet 3   fluticasone (FLONASE) 50 MCG/ACT  nasal spray PLACE 2 SPRAYS INTO BOTH NOSTRILS DAILY. USE FOR 4-6 WEEKS THEN STOP AND USE SEASONALLY OR AS NEEDED. (Patient not taking: Reported on 03/04/2021) 48 mL 1   No current facility-administered medications for this visit.  Vitals:   03/04/21 0914  BP: 140/82  Pulse: (!) 118  SpO2: 95%  Weight: 147 lb 12.8 oz (67 kg)  Height: 5\' 6"  (1.676 m)     PHYSICAL EXAM: GEN: Well nourished, well developed in no acute distress HEENT: Normal NECK: No JVD; No carotid bruits LYMPHATICS: No lymphadenopathy CARDIAC:RRR, no murmurs, rubs, gallops RESPIRATORY:  Clear to auscultation without rales, wheezing or rhonchi  ABDOMEN: Soft, non-tender, non-distended MUSCULOSKELETAL:  No edema; No deformity  SKIN: Warm and dry NEUROLOGIC:  Alert and oriented x 3 PSYCHIATRIC:  Normal affect    ASSESSMENT & PLAN: Chronic Systolic CHF NICM -likely alcohol mediated cardiomyopathy given severe alcohol use -Echo 01/18/2021 EF 20 to 25% global hypokinesis, G1 DD, normal RV function, valves okay.   -R/LHC 12/2020 normal coronary arteries.  RHC with low filling pressures, Fick method, cardiac output 3.1 L/min with a cardiac index of 1.7 L/min/m. -NYHA Class II -he appears euvolemic on exam today -he is tachycardic today but says he is compliant with his meds -continue prescription drug management with  Sprio 25mg  daily and Entresto 24-26mg  BID -increase Toprol XL to 125mg  daily due to elevated HR -continue to increase Entresto as BP allows -refer to advanced HF clinic -2D echo in Nov to reassess LVF  Alcohol Use Disorder -he has started back drinking ETOH -I discussed with him that ongoing ETOH will only worsen his heart function -unfortunately his wife drinks ETOH as well -We discussed inpatient rehab and he says that he is not interested  Recent Syncope -concern for cardiogenic syncope -discharged with lifevest -no shocks or VF episodes reviewed zoll report -Denies further syncopal  episodes -continue LifeVest until repeat echo done and if EF remains low then refer to EP for ICD   Follow up 2 months

## 2021-03-04 NOTE — Patient Instructions (Signed)
Medication Instructions:  Your physician recommends that you continue on your current medications as directed. Please refer to the Current Medication list given to you today.  *If you need a refill on your cardiac medications before your next appointment, please call your pharmacy*   Lab Work: NONE If you have labs (blood work) drawn today and your tests are completely normal, you will receive your results only by: [place] (if you have MyChart) OR A paper copy in the mail If you have any lab test that is abnormal or we need to change your treatment, we will call you to review the results.   Testing/Procedures: NONE   Follow-Up: At [facility], you and your health needs are our priority.  As part of our continuing mission to provide you with exceptional heart care, we have created designated Provider Care Teams.  These Care Teams include your primary Cardiologist (physician) and Advanced Practice Providers (APPs -  Physician Assistants and Nurse Practitioners) who all work together to provide you with the care you need, when you need it.  We recommend signing up for the patient portal called "MyChart".  Sign up information is provided on this After Visit Summary.  MyChart is used to connect with patients for Virtual Visits (Telemedicine).  Patients are able to view lab/test results, encounter notes, upcoming appointments, etc.  Non-urgent messages can be sent to your provider as well.   To learn more about what you can do with MyChart, go to [email or web address].    Your next appointment:   As needed   The format for your next appointment:   In Person  Provider:   You may see Fransico Him, MD or one of the following Advanced Practice Providers on your designated Care Team:   Melina Copa, PA-C Ermalinda Barrios, PA-C  YOU HAVE BEEN REFERRED TO SEE CHF CLINIC.

## 2021-03-04 NOTE — Addendum Note (Signed)
Addended by: Jacinta Shoe on: 03/04/2021 10:00 AM   Modules accepted: Orders

## 2021-03-09 ENCOUNTER — Encounter (HOSPITAL_COMMUNITY): Payer: Self-pay

## 2021-03-09 ENCOUNTER — Encounter: Payer: Self-pay | Admitting: Oncology

## 2021-03-09 ENCOUNTER — Emergency Department (HOSPITAL_COMMUNITY)
Admission: EM | Admit: 2021-03-09 | Discharge: 2021-03-09 | Disposition: A | Discharge status: Home / Self Care | Payer: Commercial Managed Care - PPO | Attending: Emergency Medicine | Admitting: Emergency Medicine

## 2021-03-09 ENCOUNTER — Other Ambulatory Visit: Payer: Self-pay

## 2021-03-09 DIAGNOSIS — Z87891 Personal history of nicotine dependence: Secondary | ICD-10-CM | POA: Insufficient documentation

## 2021-03-09 DIAGNOSIS — I5042 Chronic combined systolic (congestive) and diastolic (congestive) heart failure: Secondary | ICD-10-CM | POA: Insufficient documentation

## 2021-03-09 DIAGNOSIS — F1020 Alcohol dependence, uncomplicated: Secondary | ICD-10-CM | POA: Insufficient documentation

## 2021-03-09 DIAGNOSIS — Z8616 Personal history of COVID-19: Secondary | ICD-10-CM | POA: Insufficient documentation

## 2021-03-09 DIAGNOSIS — Z79899 Other long term (current) drug therapy: Secondary | ICD-10-CM | POA: Insufficient documentation

## 2021-03-09 DIAGNOSIS — I110 Hypertensive heart disease with heart failure: Secondary | ICD-10-CM | POA: Insufficient documentation

## 2021-03-09 DIAGNOSIS — Y908 Blood alcohol level of 240 mg/100 ml or more: Secondary | ICD-10-CM | POA: Insufficient documentation

## 2021-03-09 DIAGNOSIS — R45851 Suicidal ideations: Secondary | ICD-10-CM | POA: Insufficient documentation

## 2021-03-09 LAB — COMPREHENSIVE METABOLIC PANEL
ALT: 43 U/L (ref 0–44)
AST: 106 U/L — ABNORMAL HIGH (ref 15–41)
Albumin: 4.2 g/dL (ref 3.5–5.0)
Alkaline Phosphatase: 81 U/L (ref 38–126)
Anion gap: 14 (ref 5–15)
BUN: 7 mg/dL (ref 6–20)
CO2: 20 mmol/L — ABNORMAL LOW (ref 22–32)
Calcium: 8.9 mg/dL (ref 8.9–10.3)
Chloride: 99 mmol/L (ref 98–111)
Creatinine, Ser: 0.51 mg/dL — ABNORMAL LOW (ref 0.61–1.24)
GFR, Estimated: >60 mL/min (ref >60)
Glucose, Bld: 101 mg/dL — ABNORMAL HIGH (ref 70–99)
Potassium: 3.7 mmol/L (ref 3.5–5.1)
Sodium: 133 mmol/L — ABNORMAL LOW (ref 135–145)
Total Bilirubin: 0.9 mg/dL (ref 0.3–1.2)
Total Protein: 7.8 g/dL (ref 6.5–8.1)

## 2021-03-09 LAB — CBC WITH DIFFERENTIAL/PLATELET
Abs Immature Granulocytes: 0.02 10*3/uL (ref 0.00–0.07)
Basophils Absolute: 0.1 10*3/uL (ref 0.0–0.1)
Basophils Relative: 2 %
Eosinophils Absolute: 0 10*3/uL (ref 0.0–0.5)
Eosinophils Relative: 0 %
HCT: 41.3 % (ref 39.0–52.0)
Hemoglobin: 14.8 g/dL (ref 13.0–17.0)
Immature Granulocytes: 0 %
Lymphocytes Relative: 36 %
Lymphs Abs: 1.9 10*3/uL (ref 0.7–4.0)
MCH: 33.7 pg (ref 26.0–34.0)
MCHC: 35.8 g/dL (ref 30.0–36.0)
MCV: 94.1 fL (ref 80.0–100.0)
Monocytes Absolute: 0.7 10*3/uL (ref 0.1–1.0)
Monocytes Relative: 13 %
Neutro Abs: 2.5 10*3/uL (ref 1.7–7.7)
Neutrophils Relative %: 49 %
Platelets: 178 10*3/uL (ref 150–400)
RBC: 4.39 MIL/uL (ref 4.22–5.81)
RDW: 15 % (ref 11.5–15.5)
WBC: 5.3 10*3/uL (ref 4.0–10.5)
nRBC: 0 % (ref 0.0–0.2)

## 2021-03-09 LAB — RAPID URINE DRUG SCREEN, HOSP PERFORMED
Amphetamines: NOT DETECTED
Barbiturates: NOT DETECTED
Benzodiazepines: NOT DETECTED
Cocaine: NOT DETECTED
Opiates: NOT DETECTED
Tetrahydrocannabinol: NOT DETECTED

## 2021-03-09 LAB — ETHANOL: Alcohol, Ethyl (B): 372 mg/dL (ref <10)

## 2021-03-09 LAB — ACETAMINOPHEN LEVEL: Acetaminophen (Tylenol), Serum: 10 ug/mL (ref 10–30)

## 2021-03-09 LAB — SALICYLATE LEVEL: Salicylate Lvl: <7 mg/dL — ABNORMAL LOW (ref 7.0–30.0)

## 2021-03-09 NOTE — ED Triage Notes (Signed)
Pt is escorted by [employer] with suicidal ideations. Pt is stating that he has lost his job in the last month and has less income in the home. Pt reports drinking heavily and has a hx of heart failure. Pt has a wife at home and states that he loves her and wants to get help.

## 2021-03-09 NOTE — Discharge Instructions (Signed)
You have been given resources to find help with alcohol dependence. You can return to the ED at any time you feel you need emergency medical assistance.

## 2021-03-09 NOTE — ED Provider Notes (Signed)
Emergency Medicine Provider Triage Evaluation Note  Luis Porter , a 60 y.o. male  was evaluated in triage.  Pt complains of suicidality  States that he plan to overdose on his heart medication.  States that he is depressed because he cannot work because of his heart disease.  He denies any chest pain or difficulty breathing.  He states he called 911 and told him that he was seeing about committing suicide.  [employer] brought him to the ER he is not under IVC.  He states he is voluntary and would like to remain so.  Denies any hallucinations or drug use.  Does drink alcohol heavily he states he used to drink 18 beers a day has cut back to more like 6 or 7 drinks per day recently.  States his last drink was yesterday.  Review of Systems  Positive: Suicidality Negative: Fever  Physical Exam  There were no vitals taken for this visit. Gen:   Awake, no distress   Resp:  Normal effort  MSK:   Moves extremities without difficulty  Other:  Abdomen protuberant, soft nontender. Somewhat depressed mood.  Medical Decision Making  Medically screening exam initiated at 9:18 PM.  Appropriate orders placed.  Darrel Hoover Wible was informed that the remainder of the evaluation will be completed by another provider, this initial triage assessment does not replace that evaluation, and the importance of remaining in the ED until their evaluation is complete.  Will place orders for labs for medical clearance.  Not under IVC at this time.   Tedd Sias, [place] 03/09/21 2120    Carmin Muskrat, MD 03/10/21 [phone]

## 2021-03-09 NOTE — ED Notes (Signed)
Pt states that he is fine and wants to leave.

## 2021-03-09 NOTE — ED Provider Notes (Addendum)
[facility] DEPT Provider Note   CSN: [id number] Arrival date & time: 03/09/21  1851     History Chief Complaint  Patient presents with   suicidal ideations    Luis Porter is a 60 y.o. male.  Patient to ED by [employer] after he called stating he was suicidal because he lost his job. No HI/AVI.   The history is provided by the patient. No language interpreter was used.      Past Medical History:  Diagnosis Date   Chronic systolic heart failure ([place])    felt to be alcohol-related   Diverticulosis    Gastric ulcer    GERD (gastroesophageal reflux disease)    History of hiatal hernia    Hypertension    Iron deficiency anemia 10/18/2019   Recurrent umbilical hernia with incarceration 11/08/2016   Previously repaired x 1    Patient Active Problem List   Diagnosis Date Noted   Alcohol withdrawal delirium ([place]) 02/10/2021   Postural dizziness with presyncope 01/31/2021   Hypernatremia 01/31/2021   Chronic combined systolic and diastolic heart failure ([place]) 01/31/2021   Syncope    Dilated cardiomyopathy ([place])    Elevated troponin 01/16/2021   Alcohol induced fatty liver 08/04/2020   Chronic alcoholic gastritis without hemorrhage 08/04/2020   Alcohol dependence in early full remission ([place]) 08/04/2020   Angioedema 07/28/2020   Alcohol withdrawal syndrome without complication ([place])    [id number] 02/19/2020   Cough 02/19/2020   Abdominal pain, epigastric    GERD (gastroesophageal reflux disease) 09/19/2019   Acute diverticulitis 09/19/2019   Alcohol abuse 09/19/2019   Essential tremor 07/04/2019   Hx of adenomatous colonic polyps 01/06/2017   Herniated lumbar intervertebral disc 05/18/2016   Mixed hyperlipidemia 05/06/2014   BPH with obstruction/lower urinary tract symptoms 03/31/2014   Insomnia 03/03/2014   Diverticulosis of large intestine without hemorrhage 03/03/2014   Alcohol abuse, daily use 03/03/2014   Essential hypertension  07/08/2011   Hiatal hernia with gastroesophageal reflux 07/07/2011   Lumbago with sciatica 07/07/2011    Past Surgical History:  Procedure Laterality Date   BACK SURGERY     COLONOSCOPY WITH PROPOFOL N/A 12/30/2016   Procedure: COLONOSCOPY WITH PROPOFOL;  Surgeon: [name], MD;  Location: [facility] ENDOSCOPY;  Service: Endoscopy;  Laterality: N/A;   COLONOSCOPY WITH PROPOFOL N/A 10/31/2019   Procedure: COLONOSCOPY WITH PROPOFOL;  Surgeon: Lin Landsman, MD;  Location: [facility] ENDOSCOPY;  Service: Gastroenterology;  Laterality: N/A;   ESOPHAGOGASTRODUODENOSCOPY (EGD) WITH PROPOFOL N/A 12/30/2016   Procedure: ESOPHAGOGASTRODUODENOSCOPY (EGD) WITH PROPOFOL;  Surgeon: [name], MD;  Location: [facility] ENDOSCOPY;  Service: Endoscopy;  Laterality: N/A;   ESOPHAGOGASTRODUODENOSCOPY (EGD) WITH PROPOFOL N/A 10/31/2019   Procedure: ESOPHAGOGASTRODUODENOSCOPY (EGD) WITH PROPOFOL;  Surgeon: Lin Landsman, MD;  Location: [facility] ENDOSCOPY;  Service: Gastroenterology;  Laterality: N/A;   HERNIA REPAIR  2725   Umbilical Hernia Repair   LUMBAR LAMINECTOMY/DECOMPRESSION MICRODISCECTOMY Right 05/18/2016   Procedure: right L4-5 microdiscectomy;  Surgeon: Blanche East, MD;  Location: [facility] ORS;  Service: Neurosurgery;  Laterality: Right;   RIGHT/LEFT HEART CATH AND CORONARY ANGIOGRAPHY N/A 01/20/2021   Procedure: RIGHT/LEFT HEART CATH AND CORONARY ANGIOGRAPHY;  Surgeon: Troy Sine, MD;  Location: [facility] LAB;  Service: Cardiovascular;  Laterality: N/A;   TONSILLECTOMY     UMBILICAL HERNIA REPAIR N/A 06/29/2018   Procedure: LAPAROSCOPIC REPAIR OF RECURRENT UMBILICAL HERNIA WITH MESH;  Surgeon: Vickie Epley, MD;  Location: [facility] ORS;  Service: General;  Laterality: N/A;  Family History  Problem Relation Age of Onset   Hypertension Mother    Cancer Mother        Ovarian   Cervical cancer Mother    Hypertension Father    Cancer Father        tongue   Throat cancer Father     Hypertension Brother    Prostate cancer Neg Hx    Breast cancer Neg Hx    Colon cancer Neg Hx    Heart attack Neg Hx    Stroke Neg Hx     Social History   Tobacco Use   Smoking status: Former    Packs/day: 2.00    Years: 25.00    Pack years: 50.00    Types: Cigarettes    Quit date: 05/31/1987    Years since quitting: 33.7   Smokeless tobacco: Never  Vaping Use   Vaping Use: Never used  Substance Use Topics   Alcohol use: Yes    Alcohol/week: 14.0 standard drinks    Types: 14 Cans of beer per week    Comment: former usage. States he drinks around 2 beer a day   Drug use: No    Home Medications Prior to Admission medications   Medication Sig Start Date End Date Taking? Authorizing Provider  albuterol (VENTOLIN HFA) 108 (90 Base) MCG/ACT inhaler INHALE 1-2 PUFFS INTO THE LUNGS EVERY 4 (FOUR) HOURS AS NEEDED FOR WHEEZING OR SHORTNESS OF BREATH. 04/11/20   Karamalegos, Alexander J, DO  fluticasone (FLONASE) 50 MCG/ACT nasal spray PLACE 2 SPRAYS INTO BOTH NOSTRILS DAILY. USE FOR 4-6 WEEKS THEN STOP AND USE SEASONALLY OR AS NEEDED. Patient not taking: Reported on 03/04/2021 07/20/20   Olin Hauser, DO  folic acid (FOLVITE) 1 MG tablet Take 1 tablet (1 mg total) by mouth daily. 01/27/21   Karamalegos, [name], DO  hydrOXYzine (ATARAX/VISTARIL) 25 MG tablet Take 1 tablet (25 mg total) by mouth every 6 (six) hours as needed for anxiety (or CIWA score </= 10). 01/21/21   Raiford Noble Latif, DO  loratadine (CLARITIN) 10 MG tablet Take 10 mg by mouth daily as needed for allergies or rhinitis.    [provider]  meclizine (ANTIVERT) 25 MG tablet Take 25 mg by mouth 3 (three) times daily as needed for dizziness.    [provider]  metoprolol succinate (TOPROL-XL) 100 MG 24 hr tablet Take 1 tablet (100 mg total) by mouth daily. Take with or immediately following a meal. 02/02/21   Dwyane Dee, MD  MIDOL COMPLETE 500-60-15 MG TABS Take 2 tablets by mouth 3  (three) times a week.    [provider]  multivitamin (ONE-A-DAY MEN'S) TABS tablet Take 1 tablet by mouth daily with breakfast.    [provider]  omeprazole (PRILOSEC) 40 MG capsule TAKE 1 CAPSULE BY MOUTH TWICE A DAY 03/02/21   Karamalegos, Alexander J, DO  ondansetron (ZOFRAN) 4 MG tablet Take 1 tablet (4 mg total) by mouth every 6 (six) hours as needed for nausea. 01/21/21   Raiford Noble Latif, DO  ondansetron (ZOFRAN-ODT) 4 MG disintegrating tablet Take 4 mg by mouth every 8 (eight) hours as needed for nausea or vomiting (dissolve orally).    [provider]  sacubitril-valsartan (ENTRESTO) 24-26 MG Take 1 tablet by mouth 2 (two) times daily. 01/21/21   Raiford Noble Latif, DO  spironolactone (ALDACTONE) 25 MG tablet Take 1 tablet (25 mg total) by mouth daily. 01/26/21   Katherine Roan, MD  thiamine 100  MG tablet Take 1 tablet (100 mg total) by mouth daily. 01/27/21   Karamalegos, [name], DO    Allergies    Benazepril, Naltrexone, and Nsaids  Review of Systems   Review of Systems  Constitutional:  Negative for chills and fever.  HENT: Negative.    Respiratory: Negative.    Cardiovascular: Negative.   Gastrointestinal: Negative.   Musculoskeletal: Negative.   Skin: Negative.   Neurological: Negative.   Psychiatric/Behavioral:  Positive for suicidal ideas.    Physical Exam Updated Vital Signs BP (!) 134/97   Pulse (!) 125   Temp 97.7 F (36.5 C) (Oral)   Resp 18   Ht 5\' 6"  (1.676 m)   Wt 67.1 kg   SpO2 97%   BMI 23.89 kg/m   Physical Exam Vitals and nursing note reviewed.  Constitutional:      Appearance: He is well-developed.  HENT:     Head: Normocephalic.  Cardiovascular:     Rate and Rhythm: Normal rate and regular rhythm.     Heart sounds: No murmur heard. Pulmonary:     Effort: Pulmonary effort is normal.     Breath sounds: Normal breath sounds. No wheezing, rhonchi or rales.  Abdominal:     General: Bowel sounds are  normal.     Palpations: Abdomen is soft.     Tenderness: There is no abdominal tenderness. There is no guarding or rebound.  Musculoskeletal:        General: Normal range of motion.     Cervical back: Normal range of motion and neck supple.  Skin:    General: Skin is warm and dry.  Neurological:     General: No focal deficit present.     Mental Status: He is alert and oriented to person, place, and time.    ED Results / Procedures / Treatments   Labs (all labs ordered are listed, but only abnormal results are displayed) Labs Reviewed  COMPREHENSIVE METABOLIC PANEL - Abnormal; Notable for the following components:      Result Value   Sodium 133 (*)    CO2 20 (*)    Glucose, Bld 101 (*)    Creatinine, Ser 0.51 (*)    AST 106 (*)    All other components within normal limits  ETHANOL - Abnormal; Notable for the following components:   Alcohol, Ethyl (B) 372 (*)    All other components within normal limits  RESP PANEL BY RT-PCR (FLU A&B, COVID) ARPGX2  CBC WITH DIFFERENTIAL/PLATELET  ACETAMINOPHEN LEVEL  RAPID URINE DRUG SCREEN, HOSP PERFORMED  SALICYLATE LEVEL    EKG None  Radiology No results found.  Procedures Procedures   Medications Ordered in ED Medications - No data to display  ED Course  I have reviewed the triage vital signs and the nursing notes.  Pertinent labs & imaging results that were available during my care of the patient were reviewed by me and considered in my medical decision making (see chart for details).    MDM Rules/Calculators/A&P                           Patient to ED by [employer] stating he was suicidal. He arrived at 7:00 pm and my assessment was at 10:45.   He states he called [employer] because he was drunk and has no intention of committing suicide because he loves his wife "and she needs me". The chart shows that, when asked, he stated his plan  was to overdose on his heart medications. At this time he states, when asked, that is all he could  think of.   Chart reviewed. Long history of alcohol abuse, heart disease. No mention of previous suicide attempt.   Attempt was made to contact his wife for collateral information but unsuccessful.   He is oriented. He currently denies SI, HI, AVH. VSS. Suspect he is safe for discharge home, with his primary problem being severe alcohol dependence.   No evidence withdrawal currently. Alcohol level quite high at 378. He is walking, speaking clearly. Will discharge home.   When I went to discuss discharge with the patient he had already left the department without informing staff.   Final Clinical Impression(s) / ED Diagnoses Final diagnoses:  None   Severe alcohol dependence  Rx / DC Orders ED Discharge Orders     None        Charlann Lange, PA-C 03/09/21 2306    Charlann Lange, PA-C 03/09/21 2317    Carmin Muskrat, MD 03/10/21 [phone]

## 2021-03-10 ENCOUNTER — Encounter: Payer: Self-pay | Admitting: Oncology

## 2021-03-15 ENCOUNTER — Encounter: Payer: Self-pay | Admitting: Oncology

## 2021-03-30 ENCOUNTER — Ambulatory Visit: Payer: Self-pay

## 2021-03-30 NOTE — Telephone Encounter (Signed)
Pt. Calling to ask for assistance with long term disability paperwork. States he has a hard time "with it and it causes a lot of anxiety." Reports he has had increased shortness of breath since August. "It's from my heart and I'm seeing doctors for this." Declines the need for appointment. "I just him not too long ago."     Answer Assessment - Initial Assessment Questions 1. RESPIRATORY STATUS: "Describe your breathing?" (e.g., wheezing, shortness of breath, unable to speak, severe coughing)      Shortness of breath 2. ONSET: "When did this breathing problem begin?"      August 3. PATTERN "Does the difficult breathing come and go, or has it been constant since it started?"      Comes and goes 4. SEVERITY: "How bad is your breathing?" (e.g., mild, moderate, severe)    - MILD: No SOB at rest, mild SOB with walking, speaks normally in sentences, can lie down, no retractions, pulse < 100.    - MODERATE: SOB at rest, SOB with minimal exertion and prefers to sit, cannot lie down flat, speaks in phrases, mild retractions, audible wheezing, pulse 100-120.    - SEVERE: Very SOB at rest, speaks in single words, struggling to breathe, sitting hunched forward, retractions, pulse > 120      Mild-moderate 5. RECURRENT SYMPTOM: "Have you had difficulty breathing before?" If Yes, ask: "When was the last time?" and "What happened that time?"      Yes 6. CARDIAC HISTORY: "Do you have any history of heart disease?" (e.g., heart attack, angina, bypass surgery, angioplasty)      Yes 7. LUNG HISTORY: "Do you have any history of lung disease?"  (e.g., pulmonary embolus, asthma, emphysema)     Yes 8. CAUSE: "What do you think is causing the breathing problem?"      My heart 9. OTHER SYMPTOMS: "Do you have any other symptoms? (e.g., dizziness, runny nose, cough, chest pain, fever)     No 10. O2 SATURATION MONITOR:  "Do you use an oxygen saturation monitor (pulse oximeter) at home?" If Yes, "What is your reading  (oxygen level) today?" "What is your usual oxygen saturation reading?" (e.g., 95%)       No 11. PREGNANCY: "Is there any chance you are pregnant?" "When was your last menstrual period?"       N/a 12. TRAVEL: "Have you traveled out of the country in the last month?" (e.g., travel history, exposures)       No  Protocols used: Breathing Difficulty-A-AH

## 2021-03-30 NOTE — Telephone Encounter (Signed)
I don't believe we have any copy of his long term disability paperwork.  I am not sure how best to help him with paperwork. Usually the company issuing the paperwork has someone to call if the patient needs help filling out forms and they can talk him through it.  Nobie Putnam, [facility] 03/30/2021, 4:54 PM

## 2021-04-09 ENCOUNTER — Encounter: Payer: Self-pay | Admitting: Oncology

## 2021-04-12 ENCOUNTER — Other Ambulatory Visit: Payer: Self-pay

## 2021-04-12 ENCOUNTER — Encounter (HOSPITAL_COMMUNITY): Payer: Self-pay | Admitting: Cardiology

## 2021-04-12 ENCOUNTER — Ambulatory Visit (HOSPITAL_BASED_OUTPATIENT_CLINIC_OR_DEPARTMENT_OTHER)
Admission: RE | Admit: 2021-04-12 | Discharge: 2021-04-12 | Disposition: A | Discharge status: Home / Self Care | Payer: Commercial Managed Care - PPO | Source: Ambulatory Visit | Attending: Family Medicine | Admitting: Family Medicine

## 2021-04-12 ENCOUNTER — Ambulatory Visit (HOSPITAL_COMMUNITY)
Admission: RE | Admit: 2021-04-12 | Discharge: 2021-04-12 | Disposition: A | Discharge status: Home / Self Care | Payer: Commercial Managed Care - PPO | Source: Ambulatory Visit | Attending: Cardiology | Admitting: Cardiology

## 2021-04-12 VITALS — BP 128/88 | HR 132 | Wt 148.4 lb

## 2021-04-12 DIAGNOSIS — F17210 Nicotine dependence, cigarettes, uncomplicated: Secondary | ICD-10-CM | POA: Diagnosis not present

## 2021-04-12 DIAGNOSIS — R000 Tachycardia, unspecified: Secondary | ICD-10-CM | POA: Insufficient documentation

## 2021-04-12 DIAGNOSIS — Z79899 Other long term (current) drug therapy: Secondary | ICD-10-CM | POA: Insufficient documentation

## 2021-04-12 DIAGNOSIS — I5022 Chronic systolic (congestive) heart failure: Secondary | ICD-10-CM | POA: Diagnosis not present

## 2021-04-12 DIAGNOSIS — I080 Rheumatic disorders of both mitral and aortic valves: Secondary | ICD-10-CM | POA: Insufficient documentation

## 2021-04-12 DIAGNOSIS — I428 Other cardiomyopathies: Secondary | ICD-10-CM | POA: Insufficient documentation

## 2021-04-12 DIAGNOSIS — F10931 Alcohol use, unspecified with withdrawal delirium: Secondary | ICD-10-CM

## 2021-04-12 DIAGNOSIS — I110 Hypertensive heart disease with heart failure: Secondary | ICD-10-CM | POA: Diagnosis not present

## 2021-04-12 DIAGNOSIS — E785 Hyperlipidemia, unspecified: Secondary | ICD-10-CM | POA: Insufficient documentation

## 2021-04-12 DIAGNOSIS — I420 Dilated cardiomyopathy: Secondary | ICD-10-CM

## 2021-04-12 DIAGNOSIS — I5042 Chronic combined systolic (congestive) and diastolic (congestive) heart failure: Secondary | ICD-10-CM

## 2021-04-12 LAB — ECHOCARDIOGRAM COMPLETE
Area-P 1/2: 7.74 cm2
Calc EF: 41 %
S' Lateral: 3.8 cm
Single Plane A2C EF: 40.3 %
Single Plane A4C EF: 40.1 %

## 2021-04-12 LAB — CBC
HCT: 47.4 % (ref 39.0–52.0)
Hemoglobin: 16.7 g/dL (ref 13.0–17.0)
MCH: 34.6 pg — ABNORMAL HIGH (ref 26.0–34.0)
MCHC: 35.2 g/dL (ref 30.0–36.0)
MCV: 98.1 fL (ref 80.0–100.0)
Platelets: 223 10*3/uL (ref 150–400)
RBC: 4.83 MIL/uL (ref 4.22–5.81)
RDW: 14.5 % (ref 11.5–15.5)
WBC: 7.5 10*3/uL (ref 4.0–10.5)
nRBC: 0 % (ref 0.0–0.2)

## 2021-04-12 LAB — COMPREHENSIVE METABOLIC PANEL
ALT: 46 U/L — ABNORMAL HIGH (ref 0–44)
AST: 141 U/L — ABNORMAL HIGH (ref 15–41)
Albumin: 3.8 g/dL (ref 3.5–5.0)
Alkaline Phosphatase: 81 U/L (ref 38–126)
Anion gap: 23 — ABNORMAL HIGH (ref 5–15)
BUN: 8 mg/dL (ref 6–20)
CO2: 16 mmol/L — ABNORMAL LOW (ref 22–32)
Calcium: 8.4 mg/dL — ABNORMAL LOW (ref 8.9–10.3)
Chloride: 101 mmol/L (ref 98–111)
Creatinine, Ser: 0.7 mg/dL (ref 0.61–1.24)
GFR, Estimated: >60 mL/min (ref >60)
Glucose, Bld: 82 mg/dL (ref 70–99)
Potassium: 3.4 mmol/L — ABNORMAL LOW (ref 3.5–5.1)
Sodium: 140 mmol/L (ref 135–145)
Total Bilirubin: 0.9 mg/dL (ref 0.3–1.2)
Total Protein: 7.4 g/dL (ref 6.5–8.1)

## 2021-04-12 LAB — ETHANOL: Alcohol, Ethyl (B): 269 mg/dL — ABNORMAL HIGH (ref <10)

## 2021-04-12 LAB — BRAIN NATRIURETIC PEPTIDE: B Natriuretic Peptide: 43.8 pg/mL (ref 0.0–100.0)

## 2021-04-12 NOTE — Patient Instructions (Signed)
WE RECOMMEND THAT YOU REPORT TO THE EMERGENCY DEPARTMENT FOR SAFE DETOX  Labs done today, your results will be available in MyChart, we will contact you for abnormal readings.  Your physician recommends that you schedule a follow-up appointment in: 1 week

## 2021-04-12 NOTE — Progress Notes (Signed)
  Echocardiogram 2D Echocardiogram has been performed.  Luis Porter 04/12/2021, 11:38 AM

## 2021-04-12 NOTE — Progress Notes (Signed)
PCP: Olin Hauser, DO Cardiology: Dr. Radford Pax HF Cardiology: Dr. Aundra Dubin  60 y.o. with history of ETOH abuse and chronic systolic CHF/nonischemic cardiomyopathy was referred by Dr. Radford Pax for evaluation of CHF.  Patient has a long history of ETOH abuse.  He was admitted in 8/22 with syncope and found by echo to have EF 20-25%.  Cath showed no significant CAD, low filling pressures, and low cardiac output.  He was started on cardiac meds and discharged home. Given syncope, he was sent home with Lifevest.  He has since then had multiple ER presentations with ETOH intoxication and ETOH withdrawal.    Echo was done today and reviewed, EF 35% with normal RV.   Today, he feels "terrible."  He says that he has cut back on ETOH, but does admit to drinking on Saturday.  He is tremulous and tachycardic today.  He has not taken his Toprol XL this morning and is not taking Entresto or spironolactone at all.  He reports dyspnea after walking about 200 feet.  No orthopnea/PND.  No further syncope.  He vomited into the sink during our visit. He denies withdrawal seizures.    ECG (personally reviewed): sinus tachy 127, nonspecific ST changes  Labs (10/22): K 3.7, creatinine 0.5  PMH: 1. ETOH abuse 2. ETOH-related fatty liver disease 3. HTN 4. Active smoker 5. Chronic systolic CHF: Nonischemic cardiomyopathy. Echo (8/22) with EF 20-25%, mild LVH, normal RV.  - LHC/RHC (8/22): no CAD; mean RA 1, PA 14/3, mean PCWP 1, CI 1.7.  - Echo (11/22): EF 35%, normal RV.   Social History   Socioeconomic History   Marital status: Divorced    Spouse name: Not on file   Number of children: Not on file   Years of education: Not on file   Highest education level: Not on file  Occupational History   Not on file  Tobacco Use   Smoking status: Former    Packs/day: 2.00    Years: 25.00    Pack years: 50.00    Types: Cigarettes    Quit date: 05/31/1987    Years since quitting: 33.8   Smokeless tobacco:  Never  Vaping Use   Vaping Use: Never used  Substance and Sexual Activity   Alcohol use: Yes    Alcohol/week: 14.0 standard drinks    Types: 14 Cans of beer per week    Comment: former usage. States he drinks around 2 beer a day   Drug use: No   Sexual activity: Yes    Comment: parners birth control  Other Topics Concern   Not on file  Social History Narrative   Not on file   Social Determinants of Health   Financial Resource Strain: Low Risk    Difficulty of Paying Living Expenses: Not very hard  Food Insecurity: No Food Insecurity   Worried About Charity fundraiser in the Last Year: Never true   Ran Out of Food in the Last Year: Never true  Transportation Needs: No Transportation Needs   Lack of Transportation (Medical): No   Lack of Transportation (Non-Medical): No  Physical Activity: Not on file  Stress: No Stress Concern Present   Feeling of Stress : Only a little  Social Connections: Unknown   Frequency of Communication with Friends and Family: Three times a week   Frequency of Social Gatherings with Friends and Family: Three times a week   Attends Religious Services: 1 to 4 times per year   Active Member of  Clubs or Organizations: No   Attends Music therapist: Never   Marital Status: Not on file  Intimate Partner Violence: Not At Risk   Fear of Current or Ex-Partner: No   Emotionally Abused: No   Physically Abused: No   Sexually Abused: No   Family History  Problem Relation Age of Onset   Hypertension Mother    Cancer Mother        Ovarian   Cervical cancer Mother    Hypertension Father    Cancer Father        tongue   Throat cancer Father    Hypertension Brother    Prostate cancer Neg Hx    Breast cancer Neg Hx    Colon cancer Neg Hx    Heart attack Neg Hx    Stroke Neg Hx    ROS: All systems reviewed and negative except as per HPI.   Current Outpatient Medications  Medication Sig Dispense Refill   Acetaminophen-Caff-Pyrilamine  (MIDOL COMPLETE PO) Take 1 tablet by mouth as needed.     albuterol (VENTOLIN HFA) 108 (90 Base) MCG/ACT inhaler INHALE 1-2 PUFFS INTO THE LUNGS EVERY 4 (FOUR) HOURS AS NEEDED FOR WHEEZING OR SHORTNESS OF BREATH. 6.7 each 1   fluticasone (FLONASE) 50 MCG/ACT nasal spray PLACE 2 SPRAYS INTO BOTH NOSTRILS DAILY. USE FOR 4-6 WEEKS THEN STOP AND USE SEASONALLY OR AS NEEDED. 48 mL 1   folic acid (FOLVITE) 1 MG tablet Take 1 tablet (1 mg total) by mouth daily. 90 tablet 3   hydrOXYzine (ATARAX/VISTARIL) 25 MG tablet Take 1 tablet (25 mg total) by mouth every 6 (six) hours as needed for anxiety (or CIWA score </= 10). 30 tablet 0   loratadine (CLARITIN) 10 MG tablet Take 10 mg by mouth daily as needed for allergies or rhinitis.     meclizine (ANTIVERT) 25 MG tablet Take 25 mg by mouth 3 (three) times daily as needed for dizziness.     metoprolol succinate (TOPROL-XL) 100 MG 24 hr tablet Take 1 tablet (100 mg total) by mouth daily. Take with or immediately following a meal. 30 tablet 3   multivitamin (ONE-A-DAY MEN'S) TABS tablet Take 1 tablet by mouth daily with breakfast.     omeprazole (PRILOSEC) 40 MG capsule TAKE 1 CAPSULE BY MOUTH TWICE A DAY 180 capsule 1   ondansetron (ZOFRAN) 4 MG tablet Take 1 tablet (4 mg total) by mouth every 6 (six) hours as needed for nausea. 20 tablet 0   spironolactone (ALDACTONE) 25 MG tablet Take 1 tablet (25 mg total) by mouth daily. 30 tablet 1   thiamine 100 MG tablet Take 1 tablet (100 mg total) by mouth daily. 90 tablet 3   sacubitril-valsartan (ENTRESTO) 24-26 MG Take 1 tablet by mouth 2 (two) times daily. (Patient not taking: Reported on 04/12/2021) 60 tablet 0   No current facility-administered medications for this encounter.   BP 128/88   Pulse (!) 132   Wt 67.3 kg (148 lb 6.4 oz)   SpO2 96%   BMI 23.95 kg/m  General: Tremulous Neck: No JVD, no thyromegaly or thyroid nodule.  Lungs: Clear to auscultation bilaterally with normal respiratory effort. CV:  Nondisplaced PMI.  Heart tachy, regular S1/S2, no S3/S4, no murmur.  No peripheral edema.  No carotid bruit.  Normal pedal pulses.  Abdomen: Soft, nontender, no hepatosplenomegaly, no distention.  Skin: Intact without lesions or rashes.  Neurologic: Alert and oriented x 3.  Psych: Normal affect. Extremities: No clubbing or  cyanosis.  HEENT: Normal.   Assessment/Plan: 1. ETOH abuse: Patient appears to be withdrawing from ETOH today with tremors and tachycardia.  I strongly suggested that he go to the ER for management of withdrawal and admission to a treatment facility.  He refused to go to the ER.  I offered to see if our social worker could get him into a treatment program without going through the ER.  He refused this also and insists on going home. I urged him to contact his PCP to help with ETOH treatment.  2. Chronic systolic CHF: Nonischemic cardiomyopathy.  Cath in 8/22 with no significant CAD.  Echo was done today, showing EF 35% with normal RV.  Suspect most likely ETOH cardiomyopathy.  He is wearing a Lifevest due to prior unexplained syncope (though this may have been related to ETOH intoxication).  NYHA class II-III symptoms.  He is not volume overloaded on exam.  - He will continue Toprol XL 100 mg daily, needs to take a dose today.  - He was on Entresto and spironolactone in the past but is not taking currently. I would like him to restart these eventually, but needs to get through current ETOH withdrawal before we adjust his meds any further.  - Continue Lifevest for now, needs to get a repeat echo once he has stopped drinking.  - BMET/BNP today.  3. Smoking: Active smoker, encouraged him to quit.   Followup next week with APP, if more stable can start back on his other cardiac meds.   Loralie Champagne 04/12/2021

## 2021-04-14 ENCOUNTER — Ambulatory Visit: Payer: Commercial Managed Care - PPO | Admitting: Gastroenterology

## 2021-04-15 ENCOUNTER — Emergency Department (HOSPITAL_COMMUNITY)
Admission: EM | Admit: 2021-04-15 | Discharge: 2021-04-16 | Discharge status: Against Medical Advice | Payer: Commercial Managed Care - PPO | Attending: Emergency Medicine | Admitting: Emergency Medicine

## 2021-04-15 ENCOUNTER — Other Ambulatory Visit: Payer: Self-pay

## 2021-04-15 ENCOUNTER — Encounter (HOSPITAL_COMMUNITY): Payer: Self-pay | Admitting: Emergency Medicine

## 2021-04-15 DIAGNOSIS — R0602 Shortness of breath: Secondary | ICD-10-CM | POA: Insufficient documentation

## 2021-04-15 DIAGNOSIS — Z5321 Procedure and treatment not carried out due to patient leaving prior to being seen by health care provider: Secondary | ICD-10-CM | POA: Diagnosis not present

## 2021-04-15 NOTE — ED Triage Notes (Signed)
Patient arrived with EMS from home reports SOB x 2 months , +} ETOH intake this evening , no chest pain , he is wearing a cardiac monitor , denies chest pain .

## 2021-04-16 NOTE — Progress Notes (Incomplete)
PCP: Olin Hauser, DO Cardiology: Dr. Radford Pax HF Cardiology: Dr. Aundra Dubin  60 y.o. with history of ETOH abuse and chronic systolic CHF/nonischemic cardiomyopathy was referred by Dr. Radford Pax for evaluation of CHF.  Patient has a long history of ETOH abuse.  He was admitted in 8/22 with syncope and found by echo to have EF 20-25%.  Cath showed no significant CAD, low filling pressures, and low cardiac output.  He was started on cardiac meds and discharged home. Given syncope, he was sent home with Lifevest.  He has since then had multiple ER presentations with ETOH intoxication and ETOH withdrawal.    Echo was done today and reviewed, EF 35% with normal RV.   Today, he feels "terrible."  He says that he has cut back on ETOH, but does admit to drinking on Saturday.  He is tremulous and tachycardic today.  He has not taken his Toprol XL this morning and is not taking Entresto or spironolactone at all.  He reports dyspnea after walking about 200 feet.  No orthopnea/PND.  No further syncope.  He vomited into the sink during our visit. He denies withdrawal seizures.    ECG (personally reviewed): sinus tachy 127, nonspecific ST changes  Labs (10/22): K 3.7, creatinine 0.5  PMH: 1. ETOH abuse 2. ETOH-related fatty liver disease 3. HTN 4. Active smoker 5. Chronic systolic CHF: Nonischemic cardiomyopathy. Echo (8/22) with EF 20-25%, mild LVH, normal RV.  - LHC/RHC (8/22): no CAD; mean RA 1, PA 14/3, mean PCWP 1, CI 1.7.  - Echo (11/22): EF 35%, normal RV.   Social History   Socioeconomic History   Marital status: Divorced    Spouse name: Not on file   Number of children: Not on file   Years of education: Not on file   Highest education level: Not on file  Occupational History   Not on file  Tobacco Use   Smoking status: Former    Packs/day: 2.00    Years: 25.00    Pack years: 50.00    Types: Cigarettes    Quit date: 05/31/1987    Years since quitting: 33.9   Smokeless tobacco:  Never  Vaping Use   Vaping Use: Never used  Substance and Sexual Activity   Alcohol use: Yes    Alcohol/week: 14.0 standard drinks    Types: 14 Cans of beer per week    Comment: former usage. States he drinks around 2 beer a day   Drug use: No   Sexual activity: Yes    Comment: parners birth control  Other Topics Concern   Not on file  Social History Narrative   Not on file   Social Determinants of Health   Financial Resource Strain: Low Risk    Difficulty of Paying Living Expenses: Not very hard  Food Insecurity: No Food Insecurity   Worried About Charity fundraiser in the Last Year: Never true   Ran Out of Food in the Last Year: Never true  Transportation Needs: No Transportation Needs   Lack of Transportation (Medical): No   Lack of Transportation (Non-Medical): No  Physical Activity: Not on file  Stress: No Stress Concern Present   Feeling of Stress : Only a little  Social Connections: Unknown   Frequency of Communication with Friends and Family: Three times a week   Frequency of Social Gatherings with Friends and Family: Three times a week   Attends Religious Services: 1 to 4 times per year   Active Member of  Clubs or Organizations: No   Attends Music therapist: Never   Marital Status: Not on file  Intimate Partner Violence: Not At Risk   Fear of Current or Ex-Partner: No   Emotionally Abused: No   Physically Abused: No   Sexually Abused: No   Family History  Problem Relation Age of Onset   Hypertension Mother    Cancer Mother        Ovarian   Cervical cancer Mother    Hypertension Father    Cancer Father        tongue   Throat cancer Father    Hypertension Brother    Prostate cancer Neg Hx    Breast cancer Neg Hx    Colon cancer Neg Hx    Heart attack Neg Hx    Stroke Neg Hx    ROS: All systems reviewed and negative except as per HPI.   Current Outpatient Medications  Medication Sig Dispense Refill   Acetaminophen-Caff-Pyrilamine  (MIDOL COMPLETE PO) Take 1 tablet by mouth as needed.     albuterol (VENTOLIN HFA) 108 (90 Base) MCG/ACT inhaler INHALE 1-2 PUFFS INTO THE LUNGS EVERY 4 (FOUR) HOURS AS NEEDED FOR WHEEZING OR SHORTNESS OF BREATH. 6.7 each 1   fluticasone (FLONASE) 50 MCG/ACT nasal spray PLACE 2 SPRAYS INTO BOTH NOSTRILS DAILY. USE FOR 4-6 WEEKS THEN STOP AND USE SEASONALLY OR AS NEEDED. 48 mL 1   folic acid (FOLVITE) 1 MG tablet Take 1 tablet (1 mg total) by mouth daily. 90 tablet 3   hydrOXYzine (ATARAX/VISTARIL) 25 MG tablet Take 1 tablet (25 mg total) by mouth every 6 (six) hours as needed for anxiety (or CIWA score </= 10). 30 tablet 0   loratadine (CLARITIN) 10 MG tablet Take 10 mg by mouth daily as needed for allergies or rhinitis.     meclizine (ANTIVERT) 25 MG tablet Take 25 mg by mouth 3 (three) times daily as needed for dizziness.     metoprolol succinate (TOPROL-XL) 100 MG 24 hr tablet Take 1 tablet (100 mg total) by mouth daily. Take with or immediately following a meal. 30 tablet 3   multivitamin (ONE-A-DAY MEN'S) TABS tablet Take 1 tablet by mouth daily with breakfast.     omeprazole (PRILOSEC) 40 MG capsule TAKE 1 CAPSULE BY MOUTH TWICE A DAY 180 capsule 1   ondansetron (ZOFRAN) 4 MG tablet Take 1 tablet (4 mg total) by mouth every 6 (six) hours as needed for nausea. 20 tablet 0   sacubitril-valsartan (ENTRESTO) 24-26 MG Take 1 tablet by mouth 2 (two) times daily. (Patient not taking: Reported on 04/12/2021) 60 tablet 0   spironolactone (ALDACTONE) 25 MG tablet Take 1 tablet (25 mg total) by mouth daily. 30 tablet 1   thiamine 100 MG tablet Take 1 tablet (100 mg total) by mouth daily. 90 tablet 3   No current facility-administered medications for this visit.   There were no vitals taken for this visit. General: Tremulous Neck: No JVD, no thyromegaly or thyroid nodule.  Lungs: Clear to auscultation bilaterally with normal respiratory effort. CV: Nondisplaced PMI.  Heart tachy, regular S1/S2, no  S3/S4, no murmur.  No peripheral edema.  No carotid bruit.  Normal pedal pulses.  Abdomen: Soft, nontender, no hepatosplenomegaly, no distention.  Skin: Intact without lesions or rashes.  Neurologic: Alert and oriented x 3.  Psych: Normal affect. Extremities: No clubbing or cyanosis.  HEENT: Normal.   Assessment/Plan: 1. ETOH abuse: Patient appears to be withdrawing from ETOH today  with tremors and tachycardia.  I strongly suggested that he go to the ER for management of withdrawal and admission to a treatment facility.  He refused to go to the ER.  I offered to see if our social worker could get him into a treatment program without going through the ER.  He refused this also and insists on going home. I urged him to contact his PCP to help with ETOH treatment.  2. Chronic systolic CHF: Nonischemic cardiomyopathy.  Cath in 8/22 with no significant CAD.  Echo was done today, showing EF 35% with normal RV.  Suspect most likely ETOH cardiomyopathy.  He is wearing a Lifevest due to prior unexplained syncope (though this may have been related to ETOH intoxication).  NYHA class II-III symptoms.  He is not volume overloaded on exam.  - He will continue Toprol XL 100 mg daily, needs to take a dose today.  - He was on Entresto and spironolactone in the past but is not taking currently. I would like him to restart these eventually, but needs to get through current ETOH withdrawal before we adjust his meds any further.  - Continue Lifevest for now, needs to get a repeat echo once he has stopped drinking.  - BMET/BNP today.  3. Smoking: Active smoker, encouraged him to quit.   Followup next week with APP, if more stable can start back on his other cardiac meds.   [place] 04/16/2021

## 2021-04-16 NOTE — ED Notes (Signed)
Unable to locate patient multiple times by staff.

## 2021-04-20 ENCOUNTER — Encounter (HOSPITAL_COMMUNITY): Payer: Commercial Managed Care - PPO

## 2021-04-30 ENCOUNTER — Ambulatory Visit: Payer: Self-pay

## 2021-04-30 NOTE — Telephone Encounter (Signed)
Patient called, left VM to return the call to the office to discuss symptoms with a nurse.    Message from [place] sent at 04/30/2021 10:58 AM EST  Pt called and discussed needing disability appt to either go back to work or extend disability.  Made him an appt for 05/10/21  he was satisfied with that.  As he was getting ready to close our call I ask him if there was anything else I could help him with and he stated he was having SOB.  I treid to get a nurse on the line but as I was waiting he hung up.  I had been talking to him for a while and he had not mentioned SOB until the closing of our call.

## 2021-04-30 NOTE — Telephone Encounter (Signed)
Pt. Reports he has had shortness of breath x 3 months. States his breathing has not changed. Has an appointment and he is good with this. As the conversation was ending states he needs to call EMS for his shortness of breath.        Reason for Disposition  [1] MILD longstanding difficulty breathing AND [2]  SAME as normal  Answer Assessment - Initial Assessment Questions 1. RESPIRATORY STATUS: "Describe your breathing?" (e.g., wheezing, shortness of breath, unable to speak, severe coughing)      Shortness 2. ONSET: "When did this breathing problem begin?"      3 months ago 3. PATTERN "Does the difficult breathing come and go, or has it been constant since it started?"      Comes and goes 4. SEVERITY: "How bad is your breathing?" (e.g., mild, moderate, severe)    - MILD: No SOB at rest, mild SOB with walking, speaks normally in sentences, can lie down, no retractions, pulse < 100.    - MODERATE: SOB at rest, SOB with minimal exertion and prefers to sit, cannot lie down flat, speaks in phrases, mild retractions, audible wheezing, pulse 100-120.    - SEVERE: Very SOB at rest, speaks in single words, struggling to breathe, sitting hunched forward, retractions, pulse > 120      Mild - moderate 5. RECURRENT SYMPTOM: "Have you had difficulty breathing before?" If Yes, ask: "When was the last time?" and "What happened that time?"      Yes 6. CARDIAC HISTORY: "Do you have any history of heart disease?" (e.g., heart attack, angina, bypass surgery, angioplasty)      Yes - sees 7. LUNG HISTORY: "Do you have any history of lung disease?"  (e.g., pulmonary embolus, asthma, emphysema)     No 8. CAUSE: "What do you think is causing the breathing problem?"      Heart problems 9. OTHER SYMPTOMS: "Do you have any other symptoms? (e.g., dizziness, runny nose, cough, chest pain, fever)     No 10. O2 SATURATION MONITOR:  "Do you use an oxygen saturation monitor (pulse oximeter) at home?" If Yes, "What is  your reading (oxygen level) today?" "What is your usual oxygen saturation reading?" (e.g., 95%)       No 11. PREGNANCY: "Is there any chance you are pregnant?" "When was your last menstrual period?"       N/a 12. TRAVEL: "Have you traveled out of the country in the last month?" (e.g., travel history, exposures)       No  Protocols used: Breathing Difficulty-A-AH

## 2021-05-10 ENCOUNTER — Encounter: Payer: Self-pay | Admitting: Oncology

## 2021-05-10 ENCOUNTER — Other Ambulatory Visit: Payer: Self-pay

## 2021-05-10 ENCOUNTER — Encounter: Payer: Self-pay | Admitting: Family Medicine

## 2021-05-10 ENCOUNTER — Ambulatory Visit: Payer: Commercial Managed Care - PPO | Admitting: Family Medicine

## 2021-05-10 ENCOUNTER — Ambulatory Visit: Payer: Self-pay | Admitting: Family Medicine

## 2021-05-10 VITALS — BP 129/95 | HR 86 | Ht 66.0 in | Wt 151.2 lb

## 2021-05-10 DIAGNOSIS — F10939 Alcohol use, unspecified with withdrawal, unspecified: Secondary | ICD-10-CM | POA: Diagnosis not present

## 2021-05-10 DIAGNOSIS — I420 Dilated cardiomyopathy: Secondary | ICD-10-CM

## 2021-05-10 DIAGNOSIS — I5042 Chronic combined systolic (congestive) and diastolic (congestive) heart failure: Secondary | ICD-10-CM

## 2021-05-10 DIAGNOSIS — Z23 Encounter for immunization: Secondary | ICD-10-CM

## 2021-05-10 NOTE — Patient Instructions (Addendum)
Thank you for coming to the office today.  Call Dr Aundra Dubin to be seen anytime ASAP for restart medications for CHF. Now that alcohol is reduced.  [facility] at [facility] [place], [place] [address] Ph: [phone]  I agree I don't believe you can return to work at this time.  I will ask them to help with the [place] paperwork.   Please schedule a Follow-up Appointment to: Return if symptoms worsen or fail to improve.  If you have any other questions or concerns, please feel free to call the office or send a message through [place]. You may also schedule an earlier appointment if necessary.  Additionally, you may be receiving a survey about your experience at our office within a few days to 1 week by e-mail or mail. We value your feedback.  Nobie Putnam, DO [place]

## 2021-05-10 NOTE — Progress Notes (Signed)
Subjective:    Patient ID: Luis Porter, male    DOB: [birth year], 60 y.o.   MRN: [id number]  Luis Porter is a 60 y.o. male presenting on 05/10/2021 for Congestive Heart Failure   HPI  Chronic Systolic CHF/nonischemic Cardiomyopathy Alcohol Dependence, abuse  Recently established with new CHF Clinic seen by Dr Loralie Champagne [facility] [place], recent update last seen 04/12/21, had ECHO results with EF 35% with normal RV see results below.  Patient has had complicated course with chronic alcohol dependence, multiple visits to ED and doctors office for acute alcohol intoxication and requiring detox, has had several attempts to quit alcohol, unsuccessful, has tried detox programs as well substance help and unsuccessful. He has tried medical management outpatient as well.  He had been on short term disability out of work for various reasons previously including constellation of symptoms and GI concerns related to alcohol. With gastritis / diverticulitis, and PUD. Also syncope and dehydration with diarrhea.  Currently he asks about status of if eligible to go back to work, however he states that he feels physically unable to return. He feels fatigued and tired.  Down from 18 pack per day, down to 3 beers per day Has LifeVest per Cardiology  Previously on Spironolactone and Entrestro previously Off Amlodipine Wife is alcoholic as well, and says that this will impact him drinking in future.   Depression screen [facility] 2/9 05/10/2021 09/30/2019 11/20/2018  Decreased Interest 1 0 0  Down, Depressed, Hopeless 2 0 0  PHQ - 2 Score 3 0 0  Altered sleeping 0 - -  Tired, decreased energy 1 - -  Change in appetite 1 - -  Feeling bad or failure about yourself  0 - -  Trouble concentrating 0 - -  Moving slowly or fidgety/restless 0 - -  Suicidal thoughts 0 - -  PHQ-9 Score 5 - -  Difficult doing work/chores Extremely dIfficult - -    Social History   Tobacco Use   Smoking  status: Former    Packs/day: 2.00    Years: 25.00    Pack years: 50.00    Types: Cigarettes    Quit date: 05/31/1987    Years since quitting: 33.9   Smokeless tobacco: Never  Vaping Use   Vaping Use: Never used  Substance Use Topics   Alcohol use: Yes    Alcohol/week: 14.0 standard drinks    Types: 14 Cans of beer per week    Comment: former usage. States he drinks around 2 beer a day   Drug use: No    Review of Systems Per HPI unless specifically indicated above     Objective:    BP (!) 129/95   Pulse 86   Ht 5\' 6"  (1.676 m)   Wt 151 lb 3.2 oz (68.6 kg)   SpO2 98%   BMI 24.40 kg/m   Wt Readings from Last 3 Encounters:  05/10/21 151 lb 3.2 oz (68.6 kg)  04/12/21 148 lb 6.4 oz (67.3 kg)  03/09/21 148 lb (67.1 kg)    Physical Exam Vitals and nursing note reviewed.  Constitutional:      General: He is not in acute distress.    Appearance: Normal appearance. He is well-developed. He is not diaphoretic.     Comments: Well-appearing, comfortable, cooperative  HENT:     Head: Normocephalic and atraumatic.  Eyes:     General:        Right eye: No discharge.  Left eye: No discharge.     Conjunctiva/sclera: Conjunctivae normal.  Cardiovascular:     Rate and Rhythm: Normal rate.  Pulmonary:     Effort: Pulmonary effort is normal.  Skin:    General: Skin is warm and dry.     Findings: No erythema or rash.  Neurological:     Mental Status: He is alert and oriented to person, place, and time.  Psychiatric:        Mood and Affect: Mood normal.        Behavior: Behavior normal.        Thought Content: Thought content normal.     Comments: Well groomed, good eye contact, normal speech and thoughts   ECHOCARDIOGRAM REPORT         Patient Name:   Luis Porter Date of Exam: 04/12/2021  Medical Rec #:  [id number]             Height:       66.0 in  Accession #:    [id number]            Weight:       148.0 lb  Date of Birth:  06-05-1960             BSA:           1.760 m  Patient Age:    89 years              BP:           135/92 mmHg  Patient Gender: M                     HR:           114 bpm.  Exam Location:  Outpatient   Procedure: 2D Echo   Indications:    chronic systolic chf     History:        Patient has prior history of Echocardiogram examinations,  most                  recent 01/18/2021. Cardiomyopathy; Risk  Factors:Hypertension,                  Dyslipidemia and alcohol abuse.     Sonographer:    Johny Chess RDCS  Referring Phys: [place]     1. Left ventricular ejection fraction, by estimation, is 35%. The left  ventricle has moderately decreased function. The left ventricle  demonstrates global hypokinesis. Left ventricular diastolic parameters are  indeterminate.   2. Right ventricular systolic function is normal. The right ventricular  size is normal. Tricuspid regurgitation signal is inadequate for assessing  PA pressure.   3. The mitral valve is normal in structure. Trivial mitral valve  regurgitation. No evidence of mitral stenosis.   4. The aortic valve is tricuspid. Aortic valve regurgitation is trivial.  No aortic stenosis is present.   5. The inferior vena cava is normal in size with greater than 50%  respiratory variability, suggesting right atrial pressure of 3 mmHg.   FINDINGS   Left Ventricle: Left ventricular ejection fraction, by estimation, is  35%. The left ventricle has moderately decreased function. The left  ventricle demonstrates global hypokinesis. The left ventricular internal  cavity size was normal in size. There is  no left ventricular hypertrophy. Left ventricular diastolic parameters are  indeterminate.   Right Ventricle: The right ventricular size is normal. No increase in  right  ventricular wall thickness. Right ventricular systolic function is  normal. Tricuspid regurgitation signal is inadequate for assessing PA  pressure.   Left Atrium: Left  atrial size was normal in size.   Right Atrium: Right atrial size was normal in size.   Pericardium: There is no evidence of pericardial effusion.   Mitral Valve: The mitral valve is normal in structure. Trivial mitral  valve regurgitation. No evidence of mitral valve stenosis.   Tricuspid Valve: The tricuspid valve is normal in structure. Tricuspid  valve regurgitation is not demonstrated.   Aortic Valve: The aortic valve is tricuspid. Aortic valve regurgitation is  trivial. No aortic stenosis is present.   Pulmonic Valve: The pulmonic valve was normal in structure. Pulmonic valve  regurgitation is not visualized.   Aorta: The aortic root is normal in size and structure.   Venous: The inferior vena cava is normal in size with greater than 50%  respiratory variability, suggesting right atrial pressure of 3 mmHg.   IAS/Shunts: No atrial level shunt detected by color flow Doppler.      LEFT VENTRICLE  PLAX 2D  LVIDd:         5.00 cm     Diastology  LVIDs:         3.80 cm     LV e' medial:    4.68 cm/s  LV PW:         0.80 cm     LV E/e' medial:  5.4  LV IVS:        0.90 cm     LV e' lateral:   5.87 cm/s  LVOT diam:     2.20 cm     LV E/e' lateral: 4.3  LV SV:         34  LV SV Index:   19  LVOT Area:     3.80 cm     LV Volumes (MOD)  LV vol d, MOD A2C: 67.8 ml  LV vol d, MOD A4C: 66.6 ml  LV vol s, MOD A2C: 40.5 ml  LV vol s, MOD A4C: 39.9 ml  LV SV MOD A2C:     27.3 ml  LV SV MOD A4C:     66.6 ml  LV SV MOD BP:      27.9 ml   RIGHT VENTRICLE             IVC  RV S prime:     11.50 cm/s  IVC diam: 1.40 cm  TAPSE (M-mode): 2.0 cm   LEFT ATRIUM             Index        RIGHT ATRIUM           Index  LA diam:        3.00 cm 1.70 cm/m   RA Area:     11.20 cm  LA Vol (A2C):   18.2 ml 10.34 ml/m  RA Volume:   22.30 ml  12.67 ml/m  LA Vol (A4C):   16.6 ml 9.43 ml/m  LA Biplane Vol: 18.8 ml 10.68 ml/m   AORTIC VALVE  LVOT Vmax:   71.30 cm/s  LVOT Vmean:  45.600  cm/s  LVOT VTI:    0.089 m     AORTA  Ao Root diam: 3.20 cm  Ao Asc diam:  3.30 cm   MITRAL VALVE  MV Area (PHT): 7.74 cm    SHUNTS  MV Decel Time: 98 msec     Systemic VTI:  0.09 m  MV E velocity: 25.20 cm/s  Systemic Diam: 2.20 cm  MV A velocity: 57.20 cm/s  MV E/A ratio:  0.44   Dalton McleanMD  Electronically signed by Franki Monte  Signature Date/Time: 04/12/2021/11:50:51 AM         Final     Results for orders placed or performed during the hospital encounter of 04/12/21  Comprehensive Metabolic Panel (CMET)  Result Value Ref Range   Sodium 140 135 - 145 mmol/L   Potassium 3.4 (L) 3.5 - 5.1 mmol/L   Chloride 101 98 - 111 mmol/L   CO2 16 (L) 22 - 32 mmol/L   Glucose, Bld 82 70 - 99 mg/dL   BUN 8 6 - 20 mg/dL   Creatinine, Ser 0.70 0.61 - 1.24 mg/dL   Calcium 8.4 (L) 8.9 - 10.3 mg/dL   Total Protein 7.4 6.5 - 8.1 g/dL   Albumin 3.8 3.5 - 5.0 g/dL   AST 141 (H) 15 - 41 U/L   ALT 46 (H) 0 - 44 U/L   Alkaline Phosphatase 81 38 - 126 U/L   Total Bilirubin 0.9 0.3 - 1.2 mg/dL   GFR, Estimated >60 >60 mL/min   Anion gap 23 (H) 5 - 15  CBC  Result Value Ref Range   WBC 7.5 4.0 - 10.5 K/uL   RBC 4.83 4.22 - 5.81 MIL/uL   Hemoglobin 16.7 13.0 - 17.0 g/dL   HCT 47.4 39.0 - 52.0 %   MCV 98.1 80.0 - 100.0 fL   MCH 34.6 (H) 26.0 - 34.0 pg   MCHC 35.2 30.0 - 36.0 g/dL   RDW 14.5 11.5 - 15.5 %   Platelets 223 150 - 400 K/uL   nRBC 0.0 0.0 - 0.2 %  B Nat Peptide  Result Value Ref Range   B Natriuretic Peptide 43.8 0.0 - 100.0 pg/mL  Ethanol  Result Value Ref Range   Alcohol, Ethyl (B) 269 (H) <10 mg/dL      Assessment & Plan:   Problem List Items Addressed This Visit     Dilated cardiomyopathy (HCC)   Chronic combined systolic and diastolic heart failure (HCC)   Alcohol withdrawal syndrome with complication ([place]) - Primary   Other Visit Diagnoses     Needs flu shot       Relevant Orders   Flu Vaccine QUAD High Dose(Fluad)       No orders of the  defined types were placed in this encounter.  Alcohol withdrawal syndrome, complicated Currently on lower amount of alcohol intake per day now as reported, 3 beers per day currently. Last hospitalization / ED visit for alcohol detox in October 2022. This is the underlying cause of his dilated cardiomyopathy impacting her heart failure.  Chronic combined systolic/diastolic CHF dilated cardiomyopathy Followed by CHF Clinic Dr Aundra Dubin now Last ECHO reviewed above, remains reduced systolic. He is on medication management, had been taken off some due to alcohol intoxication - which would impact the fluid balance / diuretic medications. He was last advised by Cardiology to return in 1 week to resume meds, that was about 1 month ago.  I encouraged him to call the Cardiology office and get scheduled for that requested follow-up and he will need to resume meds, if he can remain on lower / minimal alcohol intake. The goal is to become alcohol free eventually, as we have discussed for months now, however he still has significant barriers to this success, as he is mentally not ready to quit  and fears that he will resume if he does quit, also his wife is also dependent on alcohol and states that this will not allow him to quit.  It is my opinion at this time that he is not clinically ready to return to work based on his CHF condition and alcohol abuse. He will need further treatment for both. Unfortunately, there is only so much more at our disposal to treat the alcohol issue with his barriers right now.  Will refer back to Cardiology and route this note to Dr Aundra Dubin for review.  Ultimately if patient proceeds w/ Long Term Disability out of work due to both issues, I would request some assistance in the documentation for his CHF/Cardiomyopathy impacting his ability to function/work.   Follow up plan: Return if symptoms worsen or fail to improve.   Nobie Putnam, DO [facility] 05/10/2021, 2:12 PM

## 2021-05-19 ENCOUNTER — Encounter (HOSPITAL_COMMUNITY): Payer: Commercial Managed Care - PPO

## 2021-06-04 ENCOUNTER — Telehealth: Payer: Self-pay | Admitting: Cardiology

## 2021-06-04 NOTE — Telephone Encounter (Signed)
COURTNEY FROM [employer] CALLED TO GET RENEWAL FOR LIFE VEST.   COURTNEY CAN BE REACHED AT [phone]

## 2021-06-11 NOTE — Telephone Encounter (Signed)
Follow up x 2 with [employer] rep to iron out order/renewal -will follow up again

## 2021-06-13 ENCOUNTER — Encounter (HOSPITAL_COMMUNITY): Payer: Self-pay

## 2021-06-13 ENCOUNTER — Emergency Department (HOSPITAL_COMMUNITY)
Admission: EM | Admit: 2021-06-13 | Discharge: 2021-06-13 | Disposition: A | Discharge status: Home / Self Care | Payer: Commercial Managed Care - PPO | Attending: Emergency Medicine | Admitting: Emergency Medicine

## 2021-06-13 ENCOUNTER — Encounter: Payer: Self-pay | Admitting: Oncology

## 2021-06-13 ENCOUNTER — Other Ambulatory Visit: Payer: Self-pay

## 2021-06-13 DIAGNOSIS — I5042 Chronic combined systolic (congestive) and diastolic (congestive) heart failure: Secondary | ICD-10-CM | POA: Insufficient documentation

## 2021-06-13 DIAGNOSIS — Z79899 Other long term (current) drug therapy: Secondary | ICD-10-CM | POA: Insufficient documentation

## 2021-06-13 DIAGNOSIS — I110 Hypertensive heart disease with heart failure: Secondary | ICD-10-CM | POA: Insufficient documentation

## 2021-06-13 DIAGNOSIS — R55 Syncope and collapse: Secondary | ICD-10-CM | POA: Diagnosis not present

## 2021-06-13 DIAGNOSIS — Z5321 Procedure and treatment not carried out due to patient leaving prior to being seen by health care provider: Secondary | ICD-10-CM | POA: Insufficient documentation

## 2021-06-13 DIAGNOSIS — R000 Tachycardia, unspecified: Secondary | ICD-10-CM | POA: Insufficient documentation

## 2021-06-13 DIAGNOSIS — F1010 Alcohol abuse, uncomplicated: Secondary | ICD-10-CM | POA: Insufficient documentation

## 2021-06-13 DIAGNOSIS — K625 Hemorrhage of anus and rectum: Secondary | ICD-10-CM | POA: Insufficient documentation

## 2021-06-13 LAB — CBC WITH DIFFERENTIAL/PLATELET
Abs Immature Granulocytes: 0.04 10*3/uL (ref 0.00–0.07)
Basophils Absolute: 0.1 10*3/uL (ref 0.0–0.1)
Basophils Relative: 2 %
Eosinophils Absolute: 0.1 10*3/uL (ref 0.0–0.5)
Eosinophils Relative: 2 %
HCT: 44.8 % (ref 39.0–52.0)
Hemoglobin: 15.8 g/dL (ref 13.0–17.0)
Immature Granulocytes: 1 %
Lymphocytes Relative: 44 %
Lymphs Abs: 2 10*3/uL (ref 0.7–4.0)
MCH: 35.3 pg — ABNORMAL HIGH (ref 26.0–34.0)
MCHC: 35.3 g/dL (ref 30.0–36.0)
MCV: 100.2 fL — ABNORMAL HIGH (ref 80.0–100.0)
Monocytes Absolute: 0.6 10*3/uL (ref 0.1–1.0)
Monocytes Relative: 12 %
Neutro Abs: 1.8 10*3/uL (ref 1.7–7.7)
Neutrophils Relative %: 39 %
Platelets: 137 10*3/uL — ABNORMAL LOW (ref 150–400)
RBC: 4.47 MIL/uL (ref 4.22–5.81)
RDW: 14.2 % (ref 11.5–15.5)
WBC: 4.6 10*3/uL (ref 4.0–10.5)
nRBC: 0 % (ref 0.0–0.2)

## 2021-06-13 LAB — COMPREHENSIVE METABOLIC PANEL
ALT: 83 U/L — ABNORMAL HIGH (ref 0–44)
AST: 230 U/L — ABNORMAL HIGH (ref 15–41)
Albumin: 4.1 g/dL (ref 3.5–5.0)
Alkaline Phosphatase: 79 U/L (ref 38–126)
Anion gap: 12 (ref 5–15)
BUN: 6 mg/dL (ref 6–20)
CO2: 26 mmol/L (ref 22–32)
Calcium: 8.9 mg/dL (ref 8.9–10.3)
Chloride: 102 mmol/L (ref 98–111)
Creatinine, Ser: 0.66 mg/dL (ref 0.61–1.24)
GFR, Estimated: >60 mL/min (ref >60)
Glucose, Bld: 112 mg/dL — ABNORMAL HIGH (ref 70–99)
Potassium: 3.4 mmol/L — ABNORMAL LOW (ref 3.5–5.1)
Sodium: 140 mmol/L (ref 135–145)
Total Bilirubin: 0.8 mg/dL (ref 0.3–1.2)
Total Protein: 8 g/dL (ref 6.5–8.1)

## 2021-06-13 LAB — PROTIME-INR
INR: 0.8 (ref 0.8–1.2)
Prothrombin Time: 11.5 seconds (ref 11.4–15.2)

## 2021-06-13 LAB — POC OCCULT BLOOD, ED: Fecal Occult Bld: POSITIVE — AB

## 2021-06-13 LAB — ETHANOL: Alcohol, Ethyl (B): 439 mg/dL (ref <10)

## 2021-06-13 MED ORDER — SODIUM CHLORIDE 0.9 % IV SOLN: INTRAVENOUS | Status: DC

## 2021-06-13 MED ORDER — SODIUM CHLORIDE 0.9 % IV BOLUS
500.0000 mL | Freq: Once | INTRAVENOUS | Status: AC
  Administered 2021-06-13: 500 mL via INTRAVENOUS

## 2021-06-13 NOTE — ED Provider Notes (Signed)
[facility] DEPT Provider Note   CSN: [id number] Arrival date & time: 06/13/21  1905     History  Chief complaint: Syncope.  Luis Porter is a 61 y.o. male.  Patient has a history of diverticulosis, hypertension, alcohol abuse, chronic systolic and diastolic heart failure and alcohol withdrawal delirium Patient presents to the ED for evaluation after a syncopal episode.  Patient states he passed out today.  He is unable to tell me if he had any particular prodrome.  Patient does have a history of alcohol abuse and admits to drinking 18 beers today.  Patient's wife indicated the patient was incontinent of stool and noticed blood in his stool.  He apparently has been having blood in his stool for the last 4 days.  Right now patient denies any trouble with chest pain or shortness of breath.  He denies any abdominal pain.  He denies any injuries from the syncopal episode.  Home Medications Prior to Admission medications   Medication Sig Start Date End Date Taking? Authorizing Provider  Acetaminophen-Caff-Pyrilamine (MIDOL COMPLETE PO) Take 1 tablet by mouth as needed.    [provider]  albuterol (VENTOLIN HFA) 108 (90 Base) MCG/ACT inhaler INHALE 1-2 PUFFS INTO THE LUNGS EVERY 4 (FOUR) HOURS AS NEEDED FOR WHEEZING OR SHORTNESS OF BREATH. 04/11/20   Karamalegos, Alexander J, DO  fluticasone (FLONASE) 50 MCG/ACT nasal spray PLACE 2 SPRAYS INTO BOTH NOSTRILS DAILY. USE FOR 4-6 WEEKS THEN STOP AND USE SEASONALLY OR AS NEEDED. 07/20/20   Parks Ranger, [name], DO  folic acid (FOLVITE) 1 MG tablet Take 1 tablet (1 mg total) by mouth daily. 01/27/21   Karamalegos, [name], DO  hydrOXYzine (ATARAX/VISTARIL) 25 MG tablet Take 1 tablet (25 mg total) by mouth every 6 (six) hours as needed for anxiety (or CIWA score </= 10). 01/21/21   Raiford Noble Latif, DO  loratadine (CLARITIN) 10 MG tablet Take 10 mg by mouth daily as needed for allergies or  rhinitis.    [provider]  meclizine (ANTIVERT) 25 MG tablet Take 25 mg by mouth 3 (three) times daily as needed for dizziness.    [provider]  metoprolol succinate (TOPROL-XL) 100 MG 24 hr tablet Take 1 tablet (100 mg total) by mouth daily. Take with or immediately following a meal. 02/02/21   Dwyane Dee, MD  multivitamin (ONE-A-DAY MEN'S) TABS tablet Take 1 tablet by mouth daily with breakfast.    [provider]  omeprazole (PRILOSEC) 40 MG capsule TAKE 1 CAPSULE BY MOUTH TWICE A DAY 03/02/21   Karamalegos, Alexander J, DO  ondansetron (ZOFRAN) 4 MG tablet Take 1 tablet (4 mg total) by mouth every 6 (six) hours as needed for nausea. 01/21/21   Sheikh, Omair Latif, DO  sacubitril-valsartan (ENTRESTO) 24-26 MG Take 1 tablet by mouth 2 (two) times daily. 01/21/21   Raiford Noble Latif, DO  spironolactone (ALDACTONE) 25 MG tablet Take 1 tablet (25 mg total) by mouth daily. 01/26/21   Katherine Roan, MD  thiamine 100 MG tablet Take 1 tablet (100 mg total) by mouth daily. 01/27/21   Karamalegos, [name], DO      Allergies    Benazepril, Naltrexone, and Nsaids    Review of Systems   Review of Systems  Physical Exam Updated Vital Signs BP 120/82    Pulse 93    Temp 97.6 F (36.4 C)    Resp 18    SpO2 94%  Physical Exam Vitals and nursing note  reviewed.  Constitutional:      Appearance: He is well-developed. He is not diaphoretic.  HENT:     Head: Normocephalic and atraumatic.     Right Ear: External ear normal.     Left Ear: External ear normal.  Eyes:     General: No scleral icterus.       Right eye: No discharge.        Left eye: No discharge.     Conjunctiva/sclera: Conjunctivae normal.  Neck:     Trachea: No tracheal deviation.  Cardiovascular:     Rate and Rhythm: Regular rhythm. Tachycardia present.  Pulmonary:     Effort: Pulmonary effort is normal. No respiratory distress.     Breath sounds: Normal breath sounds. No stridor. No  wheezing or rales.  Abdominal:     General: Bowel sounds are normal. There is no distension.     Palpations: Abdomen is soft.     Tenderness: There is no abdominal tenderness. There is no guarding or rebound.  Genitourinary:    Comments: Incontinent of stool, no gross blood Musculoskeletal:        General: No tenderness or deformity.     Cervical back: Neck supple.  Skin:    General: Skin is warm and dry.     Findings: No rash.  Neurological:     General: No focal deficit present.     Mental Status: He is alert.     Cranial Nerves: No cranial nerve deficit (no facial droop, extraocular movements intact, no slurred speech).     Sensory: No sensory deficit.     Motor: No abnormal muscle tone or seizure activity.     Coordination: Coordination normal.     Comments: Equal grip strength, equal plantar flexion strength  Psychiatric:        Mood and Affect: Mood normal.    ED Results / Procedures / Treatments   Labs (all labs ordered are listed, but only abnormal results are displayed) Labs Reviewed  CBC WITH DIFFERENTIAL/PLATELET - Abnormal; Notable for the following components:      Result Value   MCV 100.2 (*)    MCH 35.3 (*)    Platelets 137 (*)    All other components within normal limits  POC OCCULT BLOOD, ED - Abnormal; Notable for the following components:   Fecal Occult Bld POSITIVE (*)    All other components within normal limits  COMPREHENSIVE METABOLIC PANEL  PROTIME-INR  ETHANOL  CBG MONITORING, ED  TYPE AND SCREEN    EKG None  Radiology No results found.  Procedures Procedures    Medications Ordered in ED Medications  sodium chloride 0.9 % bolus 500 mL (500 mLs Intravenous New Bag/Given 06/13/21 2124)    And  0.9 %  sodium chloride infusion (has no administration in time range)    ED Course/ Medical Decision Making/ A&P Clinical Course as of 06/13/21 2145  Nancy Fetter Jun 13, 2021  2139 Patient's hemoglobin is normal [JK]  2141 Patient's Hemoccult is  positive. [JK]  2142 Labs are still pending.  Patient has been up several times now walking outside the room.  Patient has informed the staff that he does not want to be here any longer and he wants to leave. [JK]  2142 Patient has tried calling his wife but she will not answer the phone [JK]    Clinical Course User Index [JK] Dorie Rank, MD  Medical Decision Making  Patient presented after syncopal episode.  Patient was incontinent of stool.  Patient does have history of alcoholism and admits to daily alcohol use.  Patient is alert and awake and answering questions appropriately.  He is not showing any signs of withdrawal or delirium.  Patient indicated that he did not want to stay in the hospital any longer.  I explained to him that was concerned about his blood in his stool and the fact that he had a syncopal episode.  I am also concerned about his fecal incontinence during this episode.  I explained to the patient could have a serious bleeding that could be a danger to his life.  Patient states he understands that but does not think that is going on.  He did not want to be here in the first place.  His wife was the one that called EMS.  Patient is adamant about going home.  Patient is going to attempt to arrange transportation.  Fortunately at this time he is not hypotensive and is walking around the room without difficulty.  I do not feel that the patient meets criteria for involuntary commitment.  He will be allowed to leave AMA.        Final Clinical Impression(s) / ED Diagnoses Final diagnoses:  Syncope, unspecified syncope type  Rectal bleeding    Rx / DC Orders ED Discharge Orders     None         Dorie Rank, MD 06/13/21 2145

## 2021-06-13 NOTE — ED Notes (Addendum)
Pt refused EKG. Pt states he wants to go home. His wife was called

## 2021-06-13 NOTE — ED Triage Notes (Signed)
Pt. BIB [facility] c/o rectal bleeding x4 days. Ems described as bright red streaks in stool. HX of polyps,diverticulitis and alcoholism. Pt. Has had 18 beers today per EMS. Denies blood thinners.  EMS VS: BP:134/70 HR: 108 O2: 94% CBG:134

## 2021-06-13 NOTE — ED Notes (Signed)
Pt refused to stay for further exam. His wife is aware. MD is aware. He is leaving AMA.

## 2021-06-16 ENCOUNTER — Encounter: Payer: Self-pay | Admitting: Oncology

## 2021-06-17 ENCOUNTER — Encounter: Payer: Self-pay | Admitting: Oncology

## 2021-06-17 NOTE — Telephone Encounter (Signed)
Zoll originally ordered by Dr Hiram Gash should be signe by primary cardiologist Dr Grier Rocher rep will forward to correct office

## 2021-06-18 NOTE — Telephone Encounter (Signed)
Forms for Zoll renewal have been received.  Per Dr. Radford Pax he was to follow up with her as needed.  Dr. Aundra Dubin is following the patient so forms should be signed by him.  I spoke with the [employer] rep, Loma Sousa and made her aware. She said that whoever is comfortable signing the forms can sign them. She is going to reach back out to Dr. Claris Gladden office.

## 2021-06-24 ENCOUNTER — Telehealth: Payer: Self-pay | Admitting: Family Medicine

## 2021-06-24 NOTE — Telephone Encounter (Signed)
Short term disability claim needs all health info from August 2022-until now  Wanting to fax over clinical notes to [facility]  Fax: [phone] Claim number: [id number] Attn: Abigail Butts at [facility]

## 2021-08-13 ENCOUNTER — Encounter (HOSPITAL_COMMUNITY): Payer: Self-pay

## 2021-08-13 ENCOUNTER — Other Ambulatory Visit: Payer: Self-pay

## 2021-08-13 ENCOUNTER — Inpatient Hospital Stay (HOSPITAL_COMMUNITY)
Admission: EM | Admit: 2021-08-13 | Discharge: 2021-08-22 | DRG: 896 | Disposition: A | Discharge status: Home Health | Payer: Commercial Managed Care - PPO | Attending: Family Medicine | Admitting: Family Medicine

## 2021-08-13 ENCOUNTER — Emergency Department (HOSPITAL_COMMUNITY): Payer: Commercial Managed Care - PPO

## 2021-08-13 DIAGNOSIS — K7010 Alcoholic hepatitis without ascites: Secondary | ICD-10-CM | POA: Diagnosis present

## 2021-08-13 DIAGNOSIS — F10221 Alcohol dependence with intoxication delirium: Secondary | ICD-10-CM | POA: Diagnosis present

## 2021-08-13 DIAGNOSIS — Z8249 Family history of ischemic heart disease and other diseases of the circulatory system: Secondary | ICD-10-CM

## 2021-08-13 DIAGNOSIS — E512 Wernicke's encephalopathy: Secondary | ICD-10-CM | POA: Diagnosis present

## 2021-08-13 DIAGNOSIS — D509 Iron deficiency anemia, unspecified: Secondary | ICD-10-CM | POA: Diagnosis present

## 2021-08-13 DIAGNOSIS — Y908 Blood alcohol level of 240 mg/100 ml or more: Secondary | ICD-10-CM | POA: Diagnosis present

## 2021-08-13 DIAGNOSIS — K700 Alcoholic fatty liver: Secondary | ICD-10-CM

## 2021-08-13 DIAGNOSIS — R55 Syncope and collapse: Secondary | ICD-10-CM | POA: Diagnosis present

## 2021-08-13 DIAGNOSIS — F10121 Alcohol abuse with intoxication delirium: Secondary | ICD-10-CM | POA: Diagnosis not present

## 2021-08-13 DIAGNOSIS — I110 Hypertensive heart disease with heart failure: Secondary | ICD-10-CM | POA: Diagnosis present

## 2021-08-13 DIAGNOSIS — R270 Ataxia, unspecified: Secondary | ICD-10-CM

## 2021-08-13 DIAGNOSIS — R778 Other specified abnormalities of plasma proteins: Secondary | ICD-10-CM | POA: Diagnosis not present

## 2021-08-13 DIAGNOSIS — Z886 Allergy status to analgesic agent status: Secondary | ICD-10-CM

## 2021-08-13 DIAGNOSIS — I5A Non-ischemic myocardial injury (non-traumatic): Secondary | ICD-10-CM | POA: Diagnosis present

## 2021-08-13 DIAGNOSIS — Z87891 Personal history of nicotine dependence: Secondary | ICD-10-CM | POA: Diagnosis not present

## 2021-08-13 DIAGNOSIS — I420 Dilated cardiomyopathy: Secondary | ICD-10-CM | POA: Diagnosis present

## 2021-08-13 DIAGNOSIS — R296 Repeated falls: Secondary | ICD-10-CM | POA: Diagnosis present

## 2021-08-13 DIAGNOSIS — F1021 Alcohol dependence, in remission: Secondary | ICD-10-CM

## 2021-08-13 DIAGNOSIS — I10 Essential (primary) hypertension: Secondary | ICD-10-CM | POA: Diagnosis not present

## 2021-08-13 DIAGNOSIS — K219 Gastro-esophageal reflux disease without esophagitis: Secondary | ICD-10-CM | POA: Diagnosis present

## 2021-08-13 DIAGNOSIS — G3184 Mild cognitive impairment, so stated: Secondary | ICD-10-CM | POA: Diagnosis present

## 2021-08-13 DIAGNOSIS — Z8711 Personal history of peptic ulcer disease: Secondary | ICD-10-CM | POA: Diagnosis not present

## 2021-08-13 DIAGNOSIS — G9341 Metabolic encephalopathy: Secondary | ICD-10-CM | POA: Diagnosis present

## 2021-08-13 DIAGNOSIS — S0101XA Laceration without foreign body of scalp, initial encounter: Secondary | ICD-10-CM | POA: Diagnosis present

## 2021-08-13 DIAGNOSIS — R4182 Altered mental status, unspecified: Principal | ICD-10-CM

## 2021-08-13 DIAGNOSIS — Z20822 Contact with and (suspected) exposure to covid-19: Secondary | ICD-10-CM | POA: Diagnosis present

## 2021-08-13 DIAGNOSIS — D7589 Other specified diseases of blood and blood-forming organs: Secondary | ICD-10-CM | POA: Diagnosis present

## 2021-08-13 DIAGNOSIS — Z9181 History of falling: Secondary | ICD-10-CM

## 2021-08-13 DIAGNOSIS — Z8049 Family history of malignant neoplasm of other genital organs: Secondary | ICD-10-CM

## 2021-08-13 DIAGNOSIS — I5042 Chronic combined systolic (congestive) and diastolic (congestive) heart failure: Secondary | ICD-10-CM | POA: Diagnosis present

## 2021-08-13 DIAGNOSIS — F419 Anxiety disorder, unspecified: Secondary | ICD-10-CM | POA: Diagnosis present

## 2021-08-13 DIAGNOSIS — R7401 Elevation of levels of liver transaminase levels: Secondary | ICD-10-CM | POA: Diagnosis not present

## 2021-08-13 DIAGNOSIS — R9431 Abnormal electrocardiogram [ECG] [EKG]: Secondary | ICD-10-CM | POA: Diagnosis not present

## 2021-08-13 DIAGNOSIS — F10231 Alcohol dependence with withdrawal delirium: Secondary | ICD-10-CM | POA: Diagnosis not present

## 2021-08-13 DIAGNOSIS — E876 Hypokalemia: Secondary | ICD-10-CM | POA: Diagnosis present

## 2021-08-13 DIAGNOSIS — Z808 Family history of malignant neoplasm of other organs or systems: Secondary | ICD-10-CM

## 2021-08-13 DIAGNOSIS — Z888 Allergy status to other drugs, medicaments and biological substances status: Secondary | ICD-10-CM

## 2021-08-13 LAB — AMMONIA: Ammonia: 32 umol/L (ref 9–35)

## 2021-08-13 LAB — COMPREHENSIVE METABOLIC PANEL
ALT: 61 U/L — ABNORMAL HIGH (ref 0–44)
AST: 149 U/L — ABNORMAL HIGH (ref 15–41)
Albumin: 3.8 g/dL (ref 3.5–5.0)
Alkaline Phosphatase: 59 U/L (ref 38–126)
Anion gap: 17 — ABNORMAL HIGH (ref 5–15)
BUN: <5 mg/dL — ABNORMAL LOW (ref 6–20)
CO2: 23 mmol/L (ref 22–32)
Calcium: 8.7 mg/dL — ABNORMAL LOW (ref 8.9–10.3)
Chloride: 100 mmol/L (ref 98–111)
Creatinine, Ser: 0.63 mg/dL (ref 0.61–1.24)
GFR, Estimated: >60 mL/min (ref >60)
Glucose, Bld: 76 mg/dL (ref 70–99)
Potassium: 3.7 mmol/L (ref 3.5–5.1)
Sodium: 140 mmol/L (ref 135–145)
Total Bilirubin: 0.8 mg/dL (ref 0.3–1.2)
Total Protein: 6.9 g/dL (ref 6.5–8.1)

## 2021-08-13 LAB — URINALYSIS, ROUTINE W REFLEX MICROSCOPIC
Bacteria, UA: NONE SEEN
Bilirubin Urine: NEGATIVE
Glucose, UA: NEGATIVE mg/dL
Ketones, ur: 20 mg/dL — AB
Leukocytes,Ua: NEGATIVE
Nitrite: NEGATIVE
Protein, ur: NEGATIVE mg/dL
Specific Gravity, Urine: 1.005 (ref 1.005–1.030)
pH: 7 (ref 5.0–8.0)

## 2021-08-13 LAB — CBC WITH DIFFERENTIAL/PLATELET
Abs Immature Granulocytes: 0.03 10*3/uL (ref 0.00–0.07)
Basophils Absolute: 0.2 10*3/uL — ABNORMAL HIGH (ref 0.0–0.1)
Basophils Relative: 3 %
Eosinophils Absolute: 0 10*3/uL (ref 0.0–0.5)
Eosinophils Relative: 0 %
HCT: 42.7 % (ref 39.0–52.0)
Hemoglobin: 15.3 g/dL (ref 13.0–17.0)
Immature Granulocytes: 1 %
Lymphocytes Relative: 35 %
Lymphs Abs: 1.9 10*3/uL (ref 0.7–4.0)
MCH: 36 pg — ABNORMAL HIGH (ref 26.0–34.0)
MCHC: 35.8 g/dL (ref 30.0–36.0)
MCV: 100.5 fL — ABNORMAL HIGH (ref 80.0–100.0)
Monocytes Absolute: 0.5 10*3/uL (ref 0.1–1.0)
Monocytes Relative: 10 %
Neutro Abs: 2.8 10*3/uL (ref 1.7–7.7)
Neutrophils Relative %: 51 %
Platelets: 157 10*3/uL (ref 150–400)
RBC: 4.25 MIL/uL (ref 4.22–5.81)
RDW: 14.2 % (ref 11.5–15.5)
WBC: 5.5 10*3/uL (ref 4.0–10.5)
nRBC: 0 % (ref 0.0–0.2)

## 2021-08-13 LAB — I-STAT VENOUS BLOOD GAS, ED
Acid-Base Excess: 1 mmol/L (ref 0.0–2.0)
Bicarbonate: 24.9 mmol/L (ref 20.0–28.0)
Calcium, Ion: 1 mmol/L — ABNORMAL LOW (ref 1.15–1.40)
HCT: 47 % (ref 39.0–52.0)
Hemoglobin: 16 g/dL (ref 13.0–17.0)
O2 Saturation: 78 %
Potassium: 3.8 mmol/L (ref 3.5–5.1)
Sodium: 139 mmol/L (ref 135–145)
TCO2: 26 mmol/L (ref 22–32)
pCO2, Ven: 38.4 mmHg — ABNORMAL LOW (ref 44–60)
pH, Ven: 7.42 (ref 7.25–7.43)
pO2, Ven: 42 mmHg (ref 32–45)

## 2021-08-13 LAB — OSMOLALITY: Osmolality: 384 mOsm/kg (ref 275–295)

## 2021-08-13 LAB — RAPID URINE DRUG SCREEN, HOSP PERFORMED
Amphetamines: NOT DETECTED
Barbiturates: NOT DETECTED
Benzodiazepines: NOT DETECTED
Cocaine: NOT DETECTED
Opiates: NOT DETECTED
Tetrahydrocannabinol: NOT DETECTED

## 2021-08-13 LAB — BLOOD GAS, VENOUS
Acid-base deficit: 0.1 mmol/L (ref 0.0–2.0)
Bicarbonate: 23.9 mmol/L (ref 20.0–28.0)
Drawn by: 164
O2 Saturation: 84.8 %
Patient temperature: 37.7
pCO2, Ven: 37 mmHg — ABNORMAL LOW (ref 44–60)
pH, Ven: 7.42 (ref 7.25–7.43)
pO2, Ven: 57 mmHg — ABNORMAL HIGH (ref 32–45)

## 2021-08-13 LAB — RESP PANEL BY RT-PCR (FLU A&B, COVID) ARPGX2
Influenza A by PCR: NEGATIVE
Influenza B by PCR: NEGATIVE
SARS Coronavirus 2 by RT PCR: NEGATIVE

## 2021-08-13 LAB — ETHANOL: Alcohol, Ethyl (B): 366 mg/dL (ref <10)

## 2021-08-13 LAB — TROPONIN I (HIGH SENSITIVITY)
Troponin I (High Sensitivity): 25 ng/L — ABNORMAL HIGH (ref <18)
Troponin I (High Sensitivity): 25 ng/L — ABNORMAL HIGH (ref <18)

## 2021-08-13 LAB — BRAIN NATRIURETIC PEPTIDE: B Natriuretic Peptide: 37.3 pg/mL (ref 0.0–100.0)

## 2021-08-13 LAB — MAGNESIUM: Magnesium: 1.8 mg/dL (ref 1.7–2.4)

## 2021-08-13 LAB — PHOSPHORUS: Phosphorus: 3.5 mg/dL (ref 2.5–4.6)

## 2021-08-13 IMAGING — DX DG CHEST 1V PORT
1 series · 1 of 1 positions shown · non-contrast
Comparison: 02/19/2021

CLINICAL DATA: Shortness of breath

EXAM:
PORTABLE CHEST 1 VIEW

[chest]
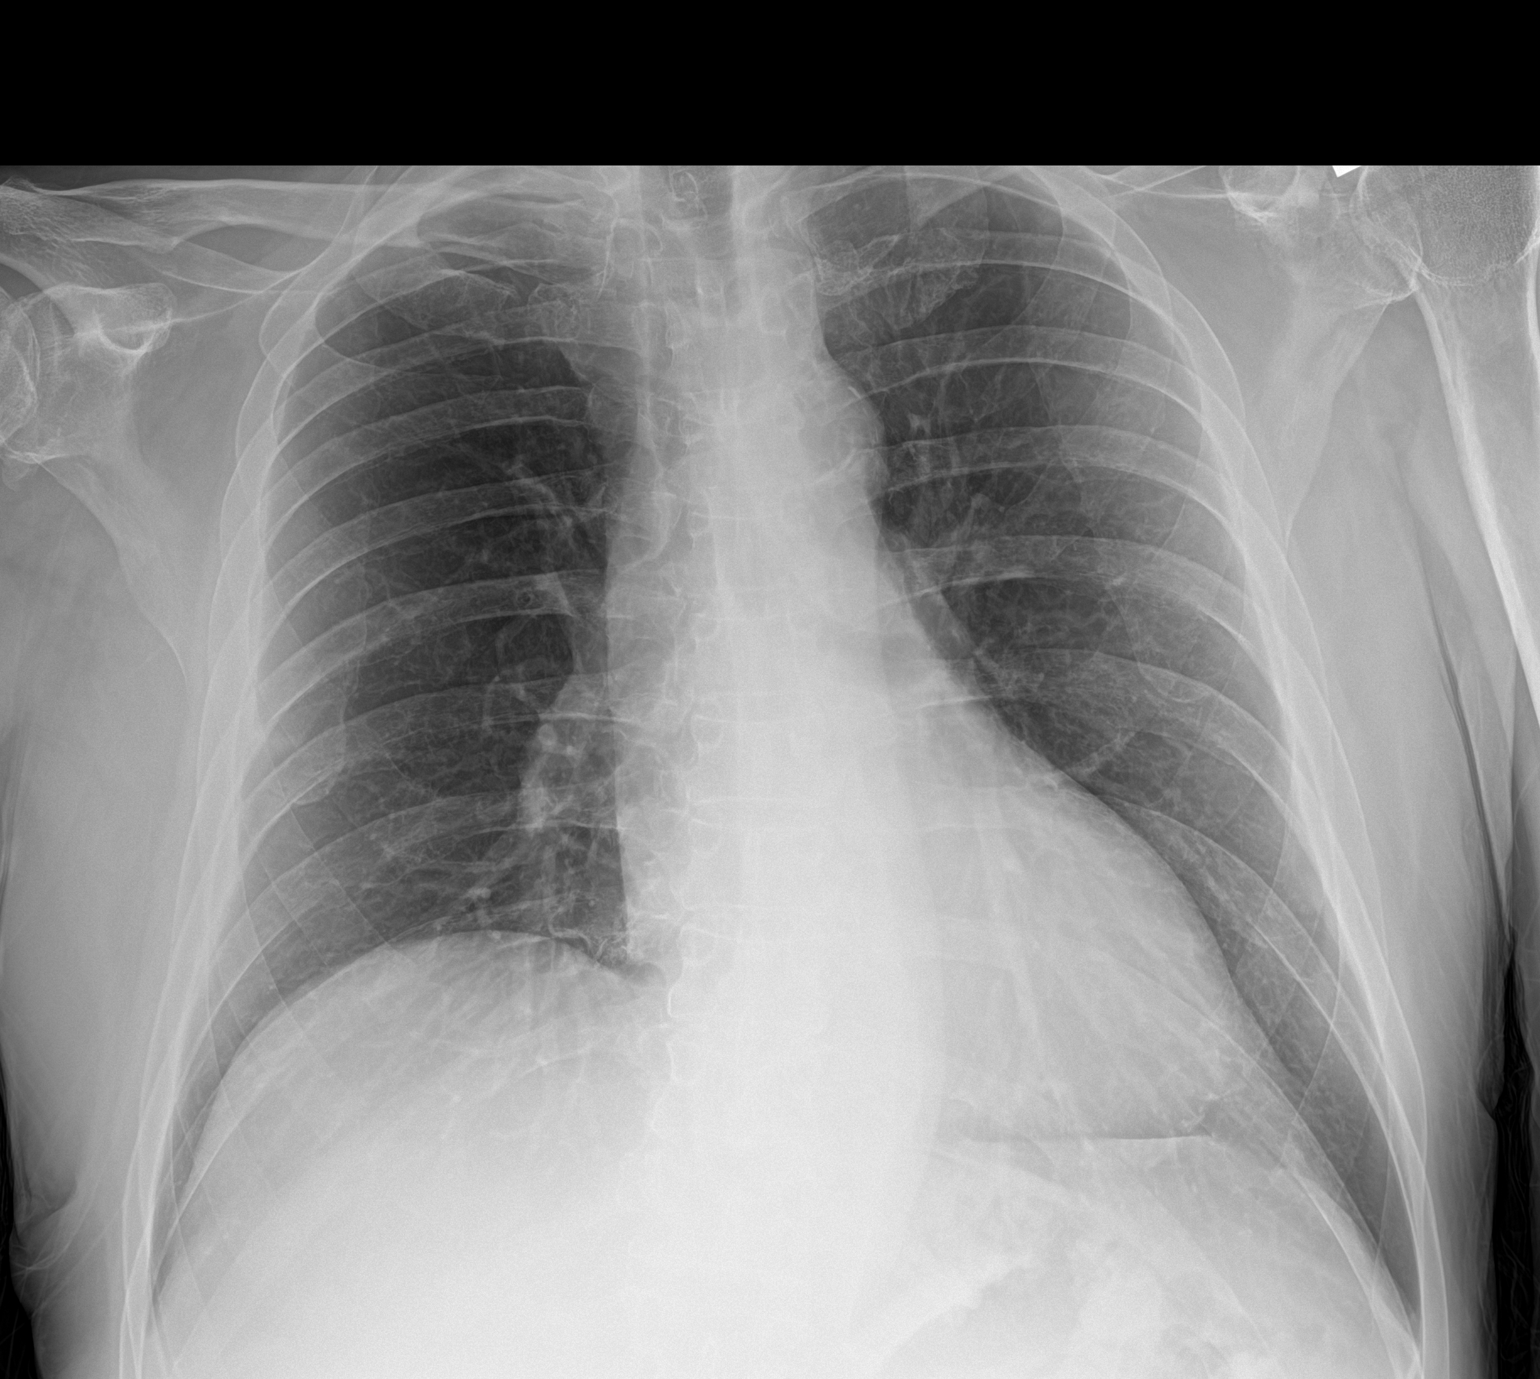

[1 of 1 positions shown; findings below may reference images not displayed]

FINDINGS: The heart size and mediastinal contours are within normal limits.
Both lungs are clear. The visualized skeletal structures are
unremarkable.
IMPRESSION: No active disease.

## 2021-08-13 IMAGING — CT CT HEAD W/O CM
3 series · 14 of 47 positions shown, 16 images · non-contrast
Comparison: None.

CLINICAL DATA: Pt arrived POV c/o dizziness, confusion, drinking
issues and not being able to think straight. Pt arrived with [REDACTED].
Per [REDACTED] wife states he has drinking issues and isnt acting right or
thinking right. Pt denies any pain at this time. Weakness and
symptoms going on for weeks.



[Series 3: head 5.0 h30s · axial · 0.46mm/px · z∈[-104,+26]mm · 8 of 32 slices shown, 10 images]
[im 3/32  brain]
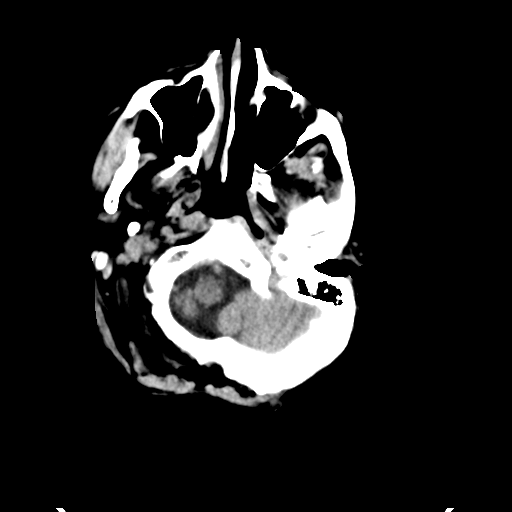
[im 3/32  bone]
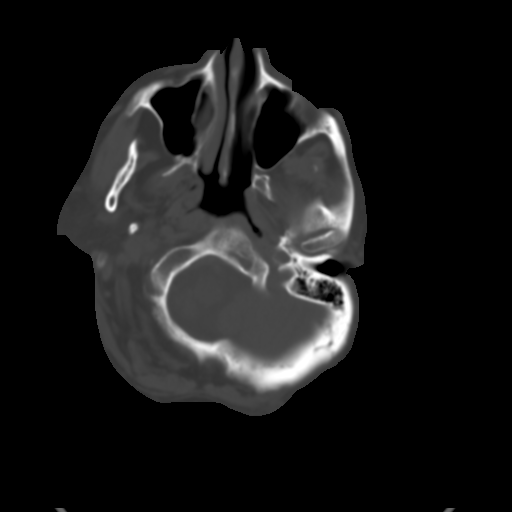
[im 7/32  brain]
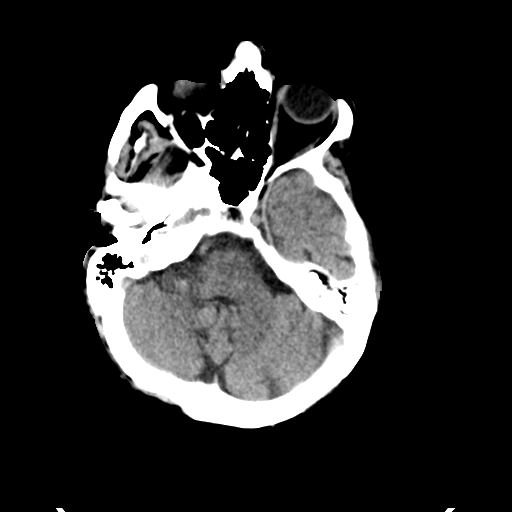
[im 10/32  brain]
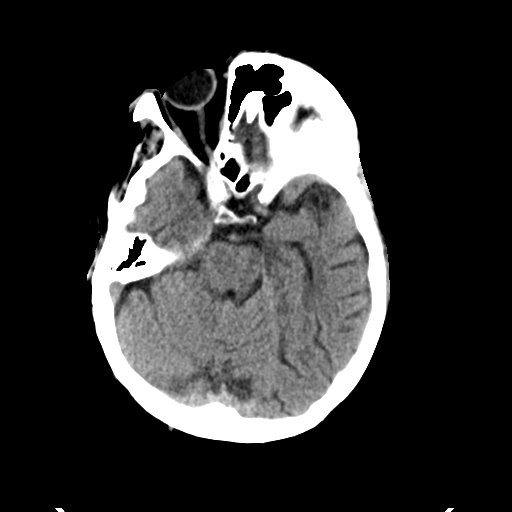
[im 14/32  brain]
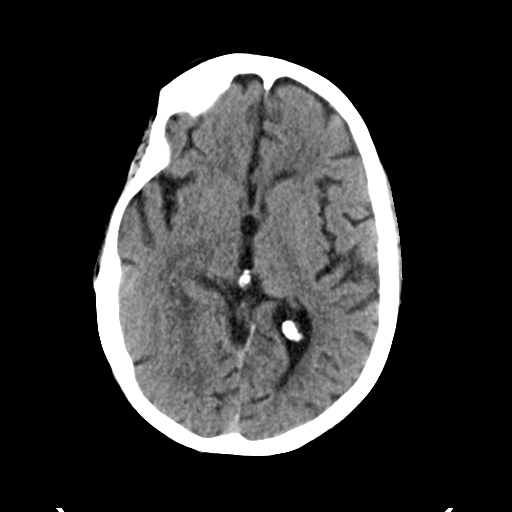
[im 18/32  brain]
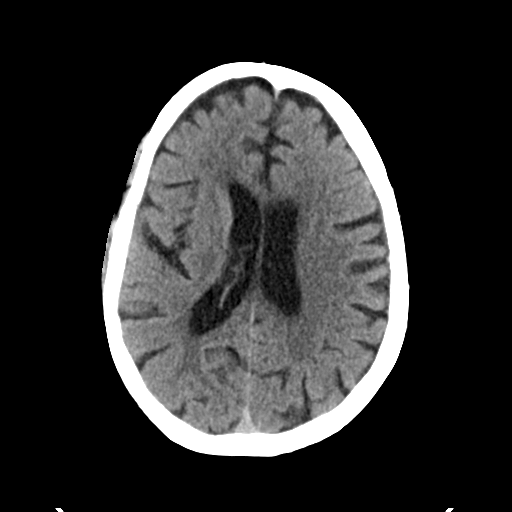
[im 18/32  bone]
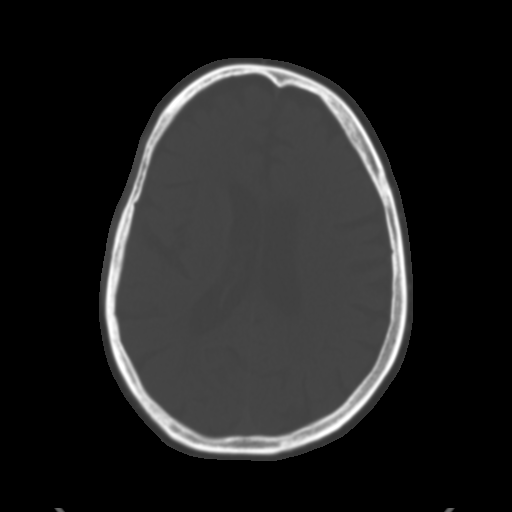
[im 22/32  brain]
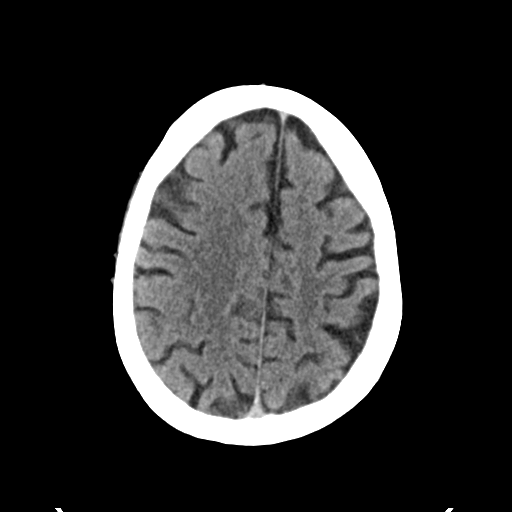
[im 25/32  brain]
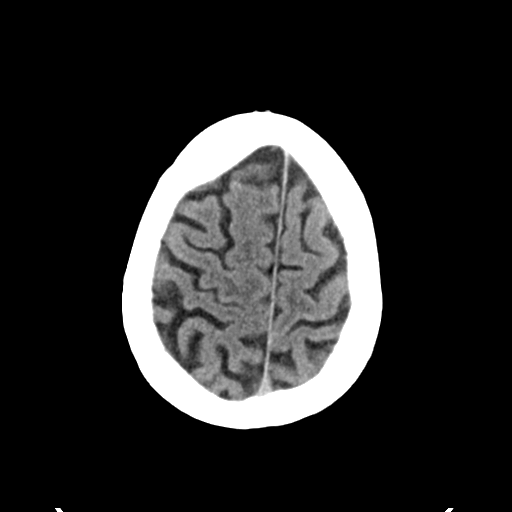
[im 29/32  brain]
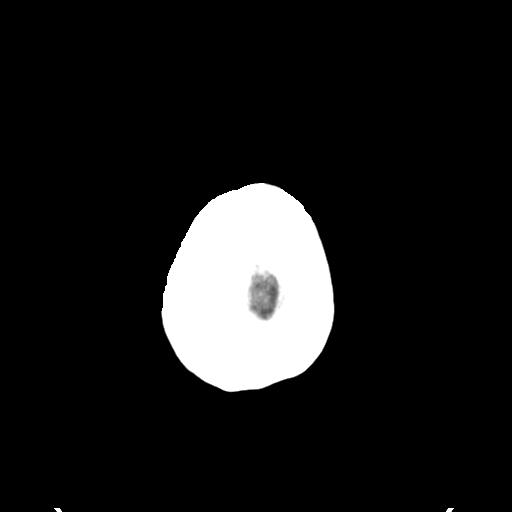

[Series 5: head 3.0 mpr cor · coronal · 0.31mm/px · 3 of 67 slices shown]
[im 23/67  brain]
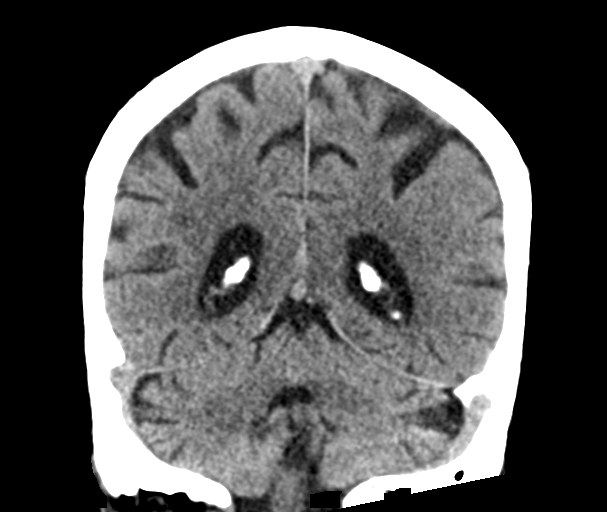
[im 30/67  brain]
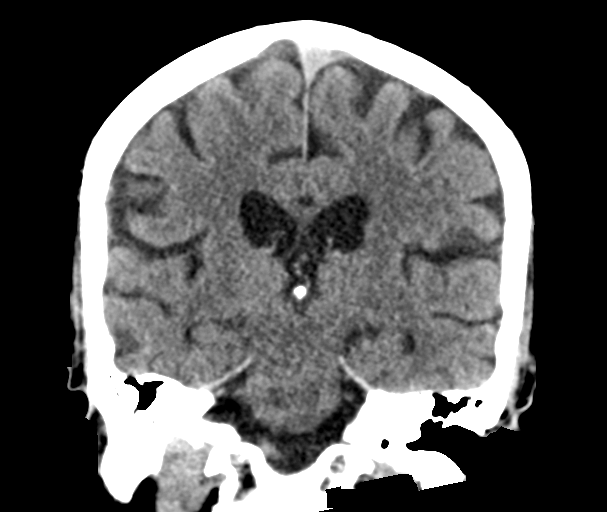
[im 37/67  brain]
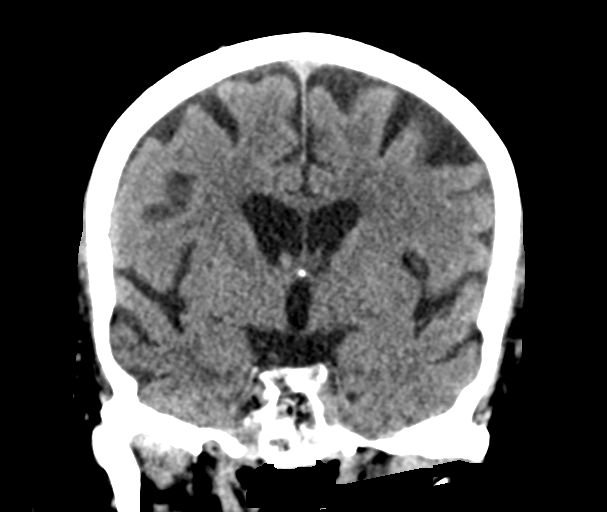

[Series 6: head 3.0 mpr sag · sagittal · 0.31mm/px · 3 of 61 slices shown]
[im 26/61  brain]
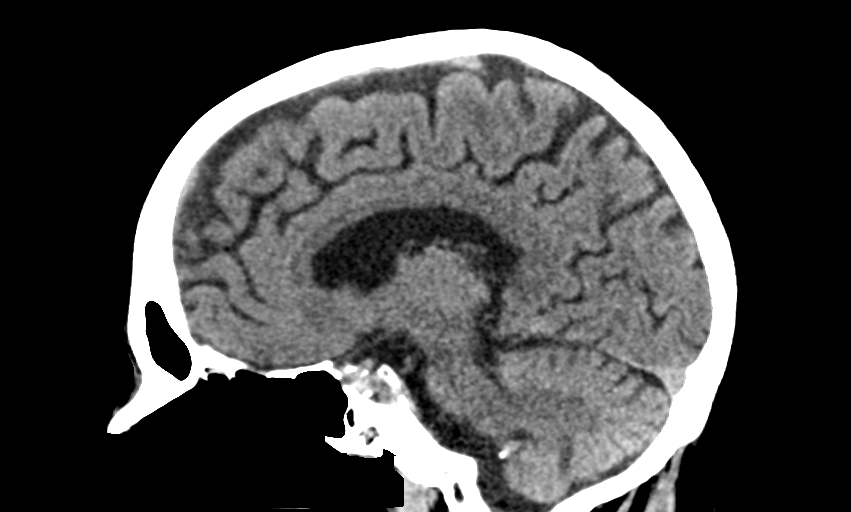
[im 31/61  brain]
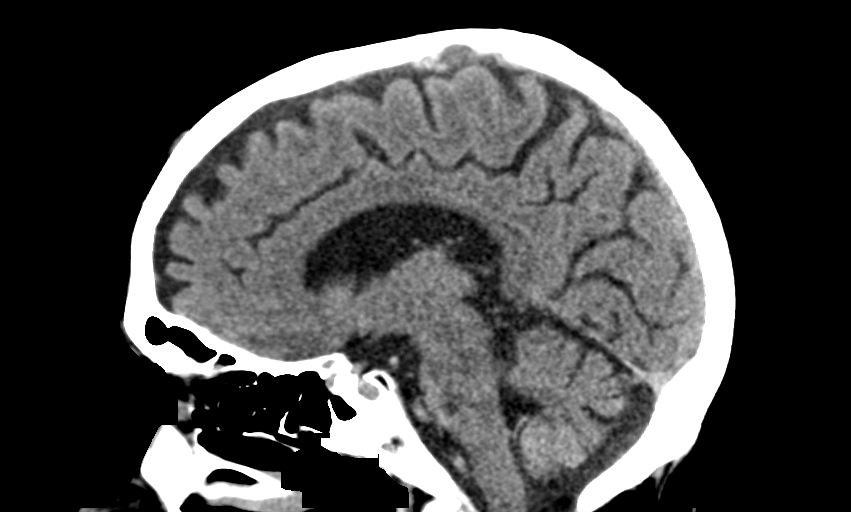
[im 36/61  brain]
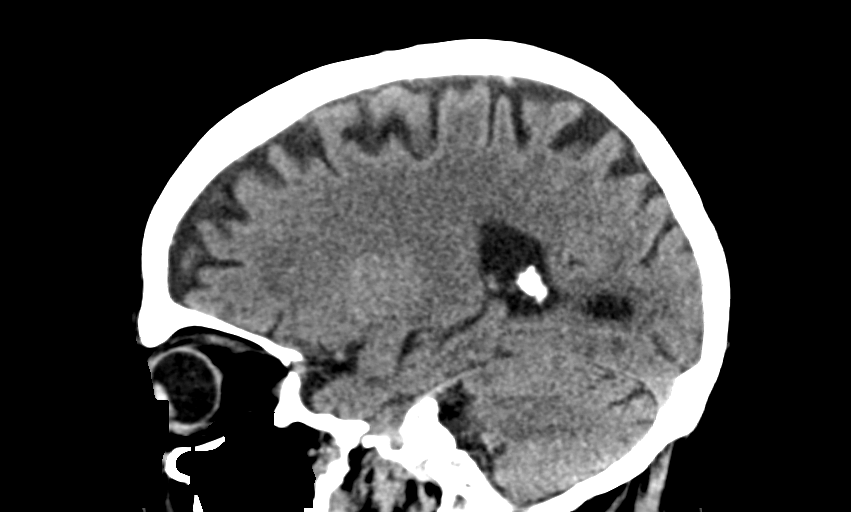

[14 of 47 positions shown; findings below may reference images not displayed]

FINDINGS: Brain: No evidence of acute infarction, hemorrhage, hydrocephalus,
extra-axial collection or mass lesion/mass effect.

Mild patchy areas of white matter hypoattenuation are noted
consistent with chronic microvascular ischemic change.

Vascular: No hyperdense vessel or unexpected calcification.

Skull: Normal. Negative for fracture or focal lesion.

Sinuses/Orbits: Globes and orbits are unremarkable. Visualized
sinuses are clear.

Other: None.
IMPRESSION: 1. No acute intracranial abnormalities.

## 2021-08-13 MED ORDER — ACETAMINOPHEN 325 MG PO TABS
650.0000 mg | ORAL_TABLET | Freq: Four times a day (QID) | ORAL | Status: DC | PRN
  Administered 2021-08-18: 650 mg via ORAL

## 2021-08-13 MED ORDER — LORAZEPAM 2 MG/ML IJ SOLN
0.0000 mg | Freq: Four times a day (QID) | INTRAMUSCULAR | Status: AC
  Administered 2021-08-13 – 2021-08-15 (×6): 2 mg via INTRAVENOUS

## 2021-08-13 MED ORDER — METOPROLOL SUCCINATE ER 100 MG PO TB24
100.0000 mg | ORAL_TABLET | Freq: Every day | ORAL | Status: DC
Start: 2021-08-13 — End: 2021-08-22
  Administered 2021-08-13 – 2021-08-22 (×10): 100 mg via ORAL

## 2021-08-13 MED ORDER — ENOXAPARIN SODIUM 40 MG/0.4ML IJ SOSY
40.0000 mg | PREFILLED_SYRINGE | INTRAMUSCULAR | Status: DC
  Administered 2021-08-13 – 2021-08-21 (×8): 40 mg via SUBCUTANEOUS

## 2021-08-13 MED ORDER — MECLIZINE HCL 25 MG PO TABS
25.0000 mg | ORAL_TABLET | Freq: Three times a day (TID) | ORAL | Status: DC | PRN
  Administered 2021-08-18: 25 mg via ORAL
  Filled 2021-08-13 (×2): qty 1

## 2021-08-13 MED ORDER — MAGNESIUM SULFATE IN D5W 1-5 GM/100ML-% IV SOLN
1.0000 g | Freq: Once | INTRAVENOUS | Status: AC
  Administered 2021-08-13: 1 g via INTRAVENOUS
  Filled 2021-08-13: qty 100

## 2021-08-13 MED ORDER — LORAZEPAM 2 MG/ML IJ SOLN: 0.0000 mg | Freq: Two times a day (BID) | INTRAMUSCULAR | Status: AC

## 2021-08-13 MED ORDER — POLYVINYL ALCOHOL 1.4 % OP SOLN
1.0000 [drp] | Freq: Every day | OPHTHALMIC | Status: DC | PRN
  Administered 2021-08-18: 2 [drp] via OPHTHALMIC
  Filled 2021-08-13: qty 15

## 2021-08-13 MED ORDER — SACUBITRIL-VALSARTAN 24-26 MG PO TABS
1.0000 | ORAL_TABLET | Freq: Two times a day (BID) | ORAL | Status: DC
  Administered 2021-08-13 – 2021-08-15 (×3): 1 via ORAL
  Filled 2021-08-13 (×6): qty 1

## 2021-08-13 MED ORDER — CALCIUM GLUCONATE-NACL 1-0.675 GM/50ML-% IV SOLN
1.0000 g | Freq: Once | INTRAVENOUS | Status: AC
  Administered 2021-08-13: 1000 mg via INTRAVENOUS
  Filled 2021-08-13: qty 50

## 2021-08-13 MED ORDER — PANTOPRAZOLE SODIUM 40 MG PO TBEC: 40.0000 mg | DELAYED_RELEASE_TABLET | Freq: Two times a day (BID) | ORAL | Status: DC

## 2021-08-13 MED ORDER — ASPIRIN 81 MG PO CHEW
324.0000 mg | CHEWABLE_TABLET | Freq: Once | ORAL | Status: AC
  Administered 2021-08-13: 324 mg via ORAL

## 2021-08-13 MED ORDER — ADULT MULTIVITAMIN W/MINERALS CH
1.0000 | ORAL_TABLET | Freq: Every day | ORAL | Status: DC
  Administered 2021-08-13 – 2021-08-22 (×10): 1 via ORAL

## 2021-08-13 MED ORDER — LORAZEPAM 2 MG/ML IJ SOLN
1.0000 mg | INTRAMUSCULAR | Status: AC | PRN
  Administered 2021-08-14: 2 mg via INTRAVENOUS

## 2021-08-13 MED ORDER — THIAMINE HCL 100 MG/ML IJ SOLN
500.0000 mg | Freq: Three times a day (TID) | INTRAVENOUS | Status: AC
  Administered 2021-08-13 – 2021-08-15 (×5): 500 mg via INTRAVENOUS
  Filled 2021-08-13 (×9): qty 5

## 2021-08-13 MED ORDER — FOLIC ACID 1 MG PO TABS
1.0000 mg | ORAL_TABLET | Freq: Every day | ORAL | Status: DC
  Administered 2021-08-14 – 2021-08-22 (×9): 1 mg via ORAL

## 2021-08-13 MED ORDER — LORATADINE 10 MG PO TABS
10.0000 mg | ORAL_TABLET | Freq: Every day | ORAL | Status: DC | PRN
  Administered 2021-08-22: 10 mg via ORAL

## 2021-08-13 MED ORDER — THIAMINE HCL 100 MG/ML IJ SOLN
500.0000 mg | Freq: Every day | INTRAVENOUS | Status: DC
  Administered 2021-08-13: 500 mg via INTRAVENOUS
  Filled 2021-08-13: qty 5

## 2021-08-13 MED ORDER — FLUTICASONE PROPIONATE 50 MCG/ACT NA SUSP: 2.0000 | Freq: Every day | NASAL | Status: DC | PRN

## 2021-08-13 MED ORDER — ALBUTEROL SULFATE (2.5 MG/3ML) 0.083% IN NEBU: 2.5000 mg | INHALATION_SOLUTION | Freq: Four times a day (QID) | RESPIRATORY_TRACT | Status: DC | PRN

## 2021-08-13 MED ORDER — ACETAMINOPHEN 650 MG RE SUPP: 650.0000 mg | Freq: Four times a day (QID) | RECTAL | Status: DC | PRN

## 2021-08-13 MED ORDER — SODIUM CHLORIDE 0.9 % IV BOLUS
500.0000 mL | Freq: Once | INTRAVENOUS | Status: AC
  Administered 2021-08-13: 500 mL via INTRAVENOUS

## 2021-08-13 MED ORDER — SODIUM CHLORIDE 0.9% FLUSH
3.0000 mL | Freq: Two times a day (BID) | INTRAVENOUS | Status: DC
  Administered 2021-08-13 – 2021-08-22 (×17): 3 mL via INTRAVENOUS

## 2021-08-13 MED ORDER — LORAZEPAM 1 MG PO TABS
1.0000 mg | ORAL_TABLET | ORAL | Status: AC | PRN
  Administered 2021-08-13: 1 mg via ORAL

## 2021-08-13 MED ORDER — SODIUM CHLORIDE 0.9 % IV SOLN
1.0000 mg | Freq: Once | INTRAVENOUS | Status: AC
  Administered 2021-08-13: 1 mg via INTRAVENOUS
  Filled 2021-08-13: qty 0.2

## 2021-08-13 MED ORDER — LORAZEPAM 2 MG/ML IJ SOLN
2.0000 mg | Freq: Once | INTRAMUSCULAR | Status: AC
  Administered 2021-08-13: 2 mg via INTRAVENOUS

## 2021-08-13 NOTE — Assessment & Plan Note (Addendum)
No signs or symptmos of ischemia. ?

## 2021-08-13 NOTE — Assessment & Plan Note (Addendum)
Continue PPI ?

## 2021-08-13 NOTE — Assessment & Plan Note (Addendum)
BP stable ?- Continue Entresto and metoprolol and spironolactone  ?

## 2021-08-13 NOTE — Assessment & Plan Note (Addendum)
Due to alcohol °

## 2021-08-13 NOTE — ED Provider Notes (Signed)
?[facility] ?Provider Note ? ? ?CSN: [id number] ?Arrival date & time: 08/13/21  3762 ? ?  ? ?History ? ?Chief Complaint  ?Patient presents with  ? Altered Mental Status  ? ? ?Luis Porter is a 61 y.o. male with past medical history of alcohol use, dilated cardiomyopathy, chronic combined systolic and diastolic heart failure, hypertension, GERD.  Presents to the emergency department with a chief complaint of altered mental status, syncopal episodes, shortness of breath, frequent falls, and increased anxiety. ? ?Patient is alert to person and year only.  Is able to answer my other questions without difficulty however he appears to be a unreliable narrator. ? ?Patient reports that he has had confusion over the last 8 months.  He states that his wife reports this has gotten worse recently.  Patient states that he is forgetting what day it is frequently.  He endorses that he is having frequent falls.  Patient reports that his last fall occurred 2 to 3 days prior.  Patient endorses syncopal episodes prior to his falls.  No preceding chest pain or shortness of breath.  Reports that he is having difficulty with his balance as a secondary cause of his falls.  Patient is unsure how long he has been he had increased falls.  Patient also reports that he is having shortness of breath with exertion.  Patient states that he becomes short of breath walking from his house to his mailbox.  No associated chest pain.  Patient denies any orthopnea, leg swelling or tenderness, paroxysmal nocturnal dyspnea, unexpected weight gain.  Patient reports that he is compliant with his spironolactone and Entresto.  Does report that he has been out of his thiamine medication for multiple months.  Patient endorses daily alcohol use.  Reports that he drinks between 3-6 beers.  Patient endorses having 3 beers earlier today. ? ?I spoke with patient's wife Harley Alto she reports that patient has had  short-term memory loss and increased confusion over the last few weeks.  States that he is not no the day or time a day despite frequent reminders.  She also has to tell patient the same thing multiple times due to him forgetting.  States that he has had frequent falls in the last few weeks as well.  She reports that 1 week prior he noticed a laceration to the back of his head and she reports that patient was unsure how this happened.  She reports that patient drinks 18-24 beers daily and liquor on occasion as well.  She denies any illicit drug use.  She reports that she is unsure if patient has been taking his medications as prescribed however she suspects he is not as he is often confused. ? ? ? ? ?Altered Mental Status ?Presenting symptoms: no confusion   ?Associated symptoms: no abdominal pain, no fever, no hallucinations, no headaches, no light-headedness, no nausea, no palpitations, no rash, no seizures, no vomiting and no weakness   ? ?  ? ?Home Medications ?Prior to Admission medications   ?Medication Sig Start Date End Date Taking? Authorizing Provider  ?Acetaminophen-Caff-Pyrilamine (MIDOL COMPLETE PO) Take 1 tablet by mouth as needed.    [provider]  ?albuterol (VENTOLIN HFA) 108 (90 Base) MCG/ACT inhaler INHALE 1-2 PUFFS INTO THE LUNGS EVERY 4 (FOUR) HOURS AS NEEDED FOR WHEEZING OR SHORTNESS OF BREATH. 04/11/20   Karamalegos, [name], DO  ?fluticasone (FLONASE) 50 MCG/ACT nasal spray PLACE 2 SPRAYS INTO BOTH NOSTRILS DAILY. USE FOR  4-6 WEEKS THEN STOP AND USE SEASONALLY OR AS NEEDED. 07/20/20   Olin Hauser, DO  ?folic acid (FOLVITE) 1 MG tablet Take 1 tablet (1 mg total) by mouth daily. 01/27/21   Karamalegos, [name], DO  ?hydrOXYzine (ATARAX/VISTARIL) 25 MG tablet Take 1 tablet (25 mg total) by mouth every 6 (six) hours as needed for anxiety (or CIWA score </= 10). 01/21/21   Raiford Noble Latif, DO  ?loratadine (CLARITIN) 10 MG tablet Take 10 mg by mouth daily as needed  for allergies or rhinitis.    [provider]  ?meclizine (ANTIVERT) 25 MG tablet Take 25 mg by mouth 3 (three) times daily as needed for dizziness.    [provider]  ?metoprolol succinate (TOPROL-XL) 100 MG 24 hr tablet Take 1 tablet (100 mg total) by mouth daily. Take with or immediately following a meal. 02/02/21   Dwyane Dee, MD  ?multivitamin (ONE-A-DAY MEN'S) TABS tablet Take 1 tablet by mouth daily with breakfast.    [provider]  ?omeprazole (PRILOSEC) 40 MG capsule TAKE 1 CAPSULE BY MOUTH TWICE A DAY 03/02/21   Karamalegos, [name], DO  ?ondansetron (ZOFRAN) 4 MG tablet Take 1 tablet (4 mg total) by mouth every 6 (six) hours as needed for nausea. 01/21/21   Raiford Noble Latif, DO  ?sacubitril-valsartan (ENTRESTO) 24-26 MG Take 1 tablet by mouth 2 (two) times daily. 01/21/21   Raiford Noble Latif, DO  ?spironolactone (ALDACTONE) 25 MG tablet Take 1 tablet (25 mg total) by mouth daily. 01/26/21   Katherine Roan, MD  ?thiamine 100 MG tablet Take 1 tablet (100 mg total) by mouth daily. 01/27/21   Olin Hauser, DO  ?   ? ?Allergies    ?Benazepril, Naltrexone, and Nsaids   ? ?Review of Systems   ?Review of Systems  ?Constitutional:  Negative for chills and fever.  ?Eyes:  Negative for visual disturbance.  ?Respiratory:  Positive for shortness of breath.   ?Cardiovascular:  Negative for chest pain, palpitations and leg swelling.  ?Gastrointestinal:  Negative for abdominal pain, nausea and vomiting.  ?Genitourinary:  Negative for difficulty urinating, dysuria, hematuria and urgency.  ?Musculoskeletal:  Negative for back pain and neck pain.  ?Skin:  Negative for color change and rash.  ?Neurological:  Positive for syncope and numbness (Baseline numbness to left great toe). Negative for dizziness, tremors, seizures, facial asymmetry, speech difficulty, weakness, light-headedness and headaches.  ?Psychiatric/Behavioral:  Negative for confusion, hallucinations and  suicidal ideas. The patient is nervous/anxious.   ? ?Physical Exam ?Updated Vital Signs ?BP 140/85 (BP Location: Right Arm)   Pulse 97   Temp 98.2 ?F (36.8 ?C) (Oral)   Resp 18   Ht '5\' 7"'$  (1.702 m)   Wt 65.8 kg   SpO2 91%   BMI 22.71 kg/m?  ?Physical Exam ?Vitals and nursing note reviewed.  ?Constitutional:   ?   General: He is not in acute distress. ?   Appearance: He is not ill-appearing, toxic-appearing or diaphoretic.  ?HENT:  ?   Head: Normocephalic. Laceration present. No raccoon eyes, Battle's sign, abrasion or contusion.  ? ?   Comments: 3 cm laceration to scalp as indicated above.  Wound is in stages of healing ?Eyes:  ?   General:     ?   Right eye: No discharge.     ?   Left eye: No discharge.  ?   Extraocular Movements: Extraocular movements intact.  ?   Right eye: Nystagmus present.  ?  Left eye: Nystagmus present.  ?   Conjunctiva/sclera: Conjunctivae normal.  ?   Pupils: Pupils are equal, round, and reactive to light.  ?   Comments: Horizontal nystagmus to bilateral eyes  ?Cardiovascular:  ?   Rate and Rhythm: Normal rate.  ?   Pulses:     ?     Radial pulses are 2+ on the right side and 2+ on the left side.  ?   Heart sounds: Normal heart sounds, S1 normal and S2 normal. No murmur heard. ?Pulmonary:  ?   Effort: Pulmonary effort is normal. No tachypnea, bradypnea or respiratory distress.  ?   Breath sounds: Normal breath sounds. No stridor.  ?Musculoskeletal:  ?   Cervical back: Normal range of motion and neck supple. No rigidity.  ?   Right lower leg: Normal. No edema.  ?   Left lower leg: Normal. No edema.  ?Skin: ?   General: Skin is warm and dry.  ?Neurological:  ?   General: No focal deficit present.  ?   Mental Status: He is alert.  ?   GCS: GCS eye subscore is 4. GCS verbal subscore is 5. GCS motor subscore is 6.  ?   Cranial Nerves: No cranial nerve deficit or facial asymmetry.  ?   Sensory: Sensation is intact.  ?   Motor: No weakness, tremor, seizure activity or pronator drift.  ?    Coordination: Finger-Nose-Finger Test normal.  ?   Gait: Gait abnormal.  ?   Comments: CN II-XII intact, equal grip strength, +5 strength to bilateral upper and lower extremities, sensation to light touch grossl

## 2021-08-13 NOTE — Assessment & Plan Note (Addendum)
Alcoholic hepatitis. ? ?

## 2021-08-13 NOTE — Assessment & Plan Note (Addendum)
Last echocardiogram revealed EF of 35% with global hypokinesis in 03/2021.   ?Euvolemic here. ?-Continue metoprolol, Entresto, spironolactone ?

## 2021-08-13 NOTE — Assessment & Plan Note (Deleted)
-   Check orthostatic vital signs ?

## 2021-08-13 NOTE — ED Triage Notes (Addendum)
Pt arrived POV c/o dizziness, confusion, drinking issues and not being able to think straight. Pt arrived with [employer]. Per [employer] wife states he has drinking issues and isnt acting right or thinking right. Pt denies any pain at this time. Pt states he has weakness and these problems have been going on for weeks.  ?

## 2021-08-13 NOTE — Assessment & Plan Note (Addendum)
Patient confused at presentation.  Per report from wife at time of admission, his memory loss and falls/ataxia were subacute, over weeks to months prior to arrival. ? ?Wernicke's was suspected over withdrawal delirium, and so high dose thiamine started. ? ?CT head normal, stroke ruled out.  Ammonia near normal.    Completed high dose IV thiamine and continues oral.  B1 level actually high.  ?if this was after treatment started. ? ?This still seems to be within the range of Wernicke's encpahlopathy.   ?-Continue thiamine and folate ?- Finish Valium taper ?- Check TSH and RPR ?- If normal TSH RPR, I think Wernicke's is most likely, and would expect slow improvement, would encourage short interval outpatient Neurology follow up ?  ? ? ? ?

## 2021-08-13 NOTE — H&P (Addendum)
?History and Physical  ? ? ?Patient: Luis Porter WLS:[id number] DOB: 10-10-1960 ?DOA: 08/13/2021 ?DOS: the patient was seen and examined on 08/13/2021 ?PCP: Olin Hauser, DO  ?Patient coming from: Home ? ?Chief Complaint:  ?Chief Complaint  ?Patient presents with  ? Altered Mental Status  ? ?HPI: Luis Porter is a 61 y.o. male with medical history significant of hypertension, dilated cardiomyopathy, chronic combined systolic and diastolic CHF, and GERD presents due to altered mental status.  Much of the history is obtained from the patient's wife over the phone as she reports that he is a poor historian and does not know that anything is wrong.  She reports that over the last several weeks to months the patient has progressively declined.  She notes that the patient does not know what day or time it is and has significant short-term memory loss.  He has been drinking 18-24 beers per day on average and sometimes liquor.  Mr. Mulhall had broken into the liquor cabinet in her home.  He lost his job approximately 10 months ago, has tickets on his car because his license plate is expired, and has not filed for disability.  Over the last week she reports that he has been urinating and defecating all over the house.  She reports that he will just fall and stare off and is not able to help himself up.  She is able to help him either and so he just sits there.  He fell last week and sustained a laceration to the back of his head, but she is not short of this happened. ? ?The patient reports that he has intermittent chest pains, palpitations, nausea, intermittent vomiting, shortness of breath with exertion, intermittently has a dry coughs due to allergies, feels nervous, numbness in his toe, passes out, and has dizziness when getting up and moving around.  Denies any significant leg swelling or fever. ? ? ?Upon admission into the emergency department patient was noted to be afebrile, pulse 91-1 14,  respirations 12-21, blood pressures elevated up to 145/96, and O2 saturation maintained on room air.  Labs significant for MCV 100.5, MCH 36, calcium 8.7, anion gap 17, ionized calcium 1, AST 149, ALT 61, alcohol level 366, and high-sensitivity troponins 25-> 25.  CT scan of the head did not note any acute abnormalities.  Patient has been given 324 mg of aspirin, 1 mg of folic acid IV, 2 g of Ativan IV, 500 mg of thiamine IV, and 500 mL bolus of normal saline IV fluids. ? ? ?Review of Systems: As mentioned in the history of present illness. All other systems reviewed and are negative. ?Past Medical History:  ?Diagnosis Date  ? Chronic systolic heart failure ([place])   ? felt to be alcohol-related  ? Diverticulosis   ? Gastric ulcer   ? GERD (gastroesophageal reflux disease)   ? History of hiatal hernia   ? Hypertension   ? Iron deficiency anemia 10/18/2019  ? Recurrent umbilical hernia with incarceration 11/08/2016  ? Previously repaired x 1  ? ?Past Surgical History:  ?Procedure Laterality Date  ? BACK SURGERY    ? COLONOSCOPY WITH PROPOFOL N/A 12/30/2016  ? Procedure: COLONOSCOPY WITH PROPOFOL;  Surgeon: [name], MD;  Location: [facility] ENDOSCOPY;  Service: Endoscopy;  Laterality: N/A;  ? COLONOSCOPY WITH PROPOFOL N/A 10/31/2019  ? Procedure: COLONOSCOPY WITH PROPOFOL;  Surgeon: Lin Landsman, MD;  Location: [facility] ENDOSCOPY;  Service: Gastroenterology;  Laterality: N/A;  ? ESOPHAGOGASTRODUODENOSCOPY (EGD) WITH PROPOFOL  N/A 12/30/2016  ? Procedure: ESOPHAGOGASTRODUODENOSCOPY (EGD) WITH PROPOFOL;  Surgeon: [name], MD;  Location: [facility] ENDOSCOPY;  Service: Endoscopy;  Laterality: N/A;  ? ESOPHAGOGASTRODUODENOSCOPY (EGD) WITH PROPOFOL N/A 10/31/2019  ? Procedure: ESOPHAGOGASTRODUODENOSCOPY (EGD) WITH PROPOFOL;  Surgeon: Lin Landsman, MD;  Location: [facility] ENDOSCOPY;  Service: Gastroenterology;  Laterality: N/A;  ? HERNIA REPAIR  2002  ? Umbilical Hernia Repair  ? LUMBAR LAMINECTOMY/DECOMPRESSION MICRODISCECTOMY  Right 05/18/2016  ? Procedure: right L4-5 microdiscectomy;  Surgeon: Blanche East, MD;  Location: [facility] ORS;  Service: Neurosurgery;  Laterality: Right;  ? RIGHT/LEFT HEART CATH AND CORONARY ANGIOGRAPHY N/A 01/20/2021  ? Procedure: RIGHT/LEFT HEART CATH AND CORONARY ANGIOGRAPHY;  Surgeon: Troy Sine, MD;  Location: [facility] LAB;  Service: Cardiovascular;  Laterality: N/A;  ? TONSILLECTOMY    ? UMBILICAL HERNIA REPAIR N/A 06/29/2018  ? Procedure: LAPAROSCOPIC REPAIR OF RECURRENT UMBILICAL HERNIA WITH MESH;  Surgeon: Vickie Epley, MD;  Location: [facility] ORS;  Service: General;  Laterality: N/A;  ? ?Social History:  reports that he quit smoking about 34 years ago. His smoking use included cigarettes. He has a 50.00 pack-year smoking history. He has never used smokeless tobacco. He reports current alcohol use of about 14.0 standard drinks per week. He reports that he does not use drugs. ? ?Allergies  ?Allergen Reactions  ? Benazepril Swelling and Other (See Comments)  ?  Angioedema  ? Naltrexone Other (See Comments)  ?  Made the patient "feel angry"  ? Nsaids Other (See Comments)  ?  Due to history of ulcers, should not have this class of medication ?  ? ? ?Family History  ?Problem Relation Age of Onset  ? Hypertension Mother   ? Cancer Mother   ?     Ovarian  ? Cervical cancer Mother   ? Hypertension Father   ? Cancer Father   ?     tongue  ? Throat cancer Father   ? Hypertension Brother   ? Prostate cancer Neg Hx   ? Breast cancer Neg Hx   ? Colon cancer Neg Hx   ? Heart attack Neg Hx   ? Stroke Neg Hx   ? ? ?Prior to Admission medications   ?Medication Sig Start Date End Date Taking? Authorizing Provider  ?Acetaminophen-Caff-Pyrilamine (MIDOL COMPLETE PO) Take 1 tablet by mouth as needed.    [provider]  ?albuterol (VENTOLIN HFA) 108 (90 Base) MCG/ACT inhaler INHALE 1-2 PUFFS INTO THE LUNGS EVERY 4 (FOUR) HOURS AS NEEDED FOR WHEEZING OR SHORTNESS OF BREATH. 04/11/20   Karamalegos,  [name], DO  ?fluticasone (FLONASE) 50 MCG/ACT nasal spray PLACE 2 SPRAYS INTO BOTH NOSTRILS DAILY. USE FOR 4-6 WEEKS THEN STOP AND USE SEASONALLY OR AS NEEDED. 07/20/20   Olin Hauser, DO  ?folic acid (FOLVITE) 1 MG tablet Take 1 tablet (1 mg total) by mouth daily. 01/27/21   Karamalegos, [name], DO  ?hydrOXYzine (ATARAX/VISTARIL) 25 MG tablet Take 1 tablet (25 mg total) by mouth every 6 (six) hours as needed for anxiety (or CIWA score </= 10). 01/21/21   Raiford Noble Latif, DO  ?loratadine (CLARITIN) 10 MG tablet Take 10 mg by mouth daily as needed for allergies or rhinitis.    [provider]  ?meclizine (ANTIVERT) 25 MG tablet Take 25 mg by mouth 3 (three) times daily as needed for dizziness.    [provider]  ?metoprolol succinate (TOPROL-XL) 100 MG 24 hr tablet Take 1 tablet (100 mg total) by mouth daily.  Take with or immediately following a meal. 02/02/21   Dwyane Dee, MD  ?multivitamin (ONE-A-DAY MEN'S) TABS tablet Take 1 tablet by mouth daily with breakfast.    [provider]  ?omeprazole (PRILOSEC) 40 MG capsule TAKE 1 CAPSULE BY MOUTH TWICE A DAY 03/02/21   Karamalegos, [name], DO  ?ondansetron (ZOFRAN) 4 MG tablet Take 1 tablet (4 mg total) by mouth every 6 (six) hours as needed for nausea. 01/21/21   Raiford Noble Latif, DO  ?sacubitril-valsartan (ENTRESTO) 24-26 MG Take 1 tablet by mouth 2 (two) times daily. 01/21/21   Raiford Noble Latif, DO  ?spironolactone (ALDACTONE) 25 MG tablet Take 1 tablet (25 mg total) by mouth daily. 01/26/21   Katherine Roan, MD  ?thiamine 100 MG tablet Take 1 tablet (100 mg total) by mouth daily. 01/27/21   Olin Hauser, DO  ? ? ?Physical Exam: ?Vitals:  ? 08/13/21 1445 08/13/21 1500 08/13/21 1515 08/13/21 1530  ?BP: 131/89 135/82 (!) 127/99 (!) 145/96  ?Pulse: 99 95 (!) 114 98  ?Resp: '18 18 15 15  '$ ?Temp:      ?TempSrc:      ?SpO2: 92% 94% 98% 95%  ?Weight:      ?Height:      ? ? ?Constitutional: NAD, calm,  comfortable ?Eyes: PERRL, lids and conjunctivae normal ?ENMT: Mucous membranes are moist. Posterior pharynx clear of any exudate or lesions.Normal dentition.  ?Neck: normal, supple, no masses, no thyromegaly ?

## 2021-08-14 DIAGNOSIS — F10121 Alcohol abuse with intoxication delirium: Secondary | ICD-10-CM | POA: Diagnosis not present

## 2021-08-14 LAB — CBC
HCT: 37.1 % — ABNORMAL LOW (ref 39.0–52.0)
Hemoglobin: 13.2 g/dL (ref 13.0–17.0)
MCH: 35.9 pg — ABNORMAL HIGH (ref 26.0–34.0)
MCHC: 35.6 g/dL (ref 30.0–36.0)
MCV: 100.8 fL — ABNORMAL HIGH (ref 80.0–100.0)
Platelets: 147 10*3/uL — ABNORMAL LOW (ref 150–400)
RBC: 3.68 MIL/uL — ABNORMAL LOW (ref 4.22–5.81)
RDW: 13.9 % (ref 11.5–15.5)
WBC: 3.8 10*3/uL — ABNORMAL LOW (ref 4.0–10.5)
nRBC: 0 % (ref 0.0–0.2)

## 2021-08-14 LAB — COMPREHENSIVE METABOLIC PANEL
ALT: 52 U/L — ABNORMAL HIGH (ref 0–44)
AST: 127 U/L — ABNORMAL HIGH (ref 15–41)
Albumin: 3.2 g/dL — ABNORMAL LOW (ref 3.5–5.0)
Alkaline Phosphatase: 55 U/L (ref 38–126)
Anion gap: 10 (ref 5–15)
BUN: 5 mg/dL — ABNORMAL LOW (ref 6–20)
CO2: 24 mmol/L (ref 22–32)
Calcium: 8.8 mg/dL — ABNORMAL LOW (ref 8.9–10.3)
Chloride: 103 mmol/L (ref 98–111)
Creatinine, Ser: 0.56 mg/dL — ABNORMAL LOW (ref 0.61–1.24)
GFR, Estimated: >60 mL/min (ref >60)
Glucose, Bld: 107 mg/dL — ABNORMAL HIGH (ref 70–99)
Potassium: 3.4 mmol/L — ABNORMAL LOW (ref 3.5–5.1)
Sodium: 137 mmol/L (ref 135–145)
Total Bilirubin: 0.9 mg/dL (ref 0.3–1.2)
Total Protein: 6.2 g/dL — ABNORMAL LOW (ref 6.5–8.1)

## 2021-08-14 LAB — VITAMIN B12: Vitamin B-12: 1002 pg/mL — ABNORMAL HIGH (ref 180–914)

## 2021-08-14 MED ORDER — DIAZEPAM 5 MG PO TABS: 10.0000 mg | ORAL_TABLET | Freq: Three times a day (TID) | ORAL | Status: DC

## 2021-08-14 MED ORDER — DIAZEPAM 5 MG PO TABS
5.0000 mg | ORAL_TABLET | Freq: Every day | ORAL | Status: DC
Start: 2021-08-19 — End: 2021-08-15

## 2021-08-14 MED ORDER — DIAZEPAM 5 MG PO TABS: 5.0000 mg | ORAL_TABLET | Freq: Two times a day (BID) | ORAL | Status: DC

## 2021-08-14 MED ORDER — POTASSIUM CHLORIDE CRYS ER 20 MEQ PO TBCR
40.0000 meq | EXTENDED_RELEASE_TABLET | Freq: Once | ORAL | Status: AC
  Administered 2021-08-14: 40 meq via ORAL

## 2021-08-14 MED ORDER — DIAZEPAM 2 MG PO TABS: 2.0000 mg | ORAL_TABLET | Freq: Every day | ORAL | Status: DC

## 2021-08-14 MED ORDER — DIAZEPAM 5 MG PO TABS
5.0000 mg | ORAL_TABLET | Freq: Three times a day (TID) | ORAL | Status: DC
  Administered 2021-08-15 (×2): 5 mg via ORAL

## 2021-08-14 MED ORDER — DIAZEPAM 5 MG PO TABS
10.0000 mg | ORAL_TABLET | Freq: Three times a day (TID) | ORAL | Status: AC
  Administered 2021-08-14 (×3): 10 mg via ORAL

## 2021-08-14 NOTE — Plan of Care (Signed)

## 2021-08-14 NOTE — Evaluation (Signed)
Physical Therapy Evaluation Patient Details Name: Luis Porter MRN: [id number] DOB: 1961/05/22 Today's Date: 08/14/2021  History of Present Illness  61 y.o. male  presents 08/13/21 due to altered mental status. +ETOH abuse (18-24 beers/day). Recent fall with laceration to back of head.  PMH significant of hypertension, dilated cardiomyopathy, chronic combined systolic and diastolic CHF, and GERD  Clinical Impression   Pt admitted secondary to problem above with deficits below. Pt's prior functional status is unknown as he is currently a poor historian. Pt currently requires 2 person assist to balance in sitting at EOB. Due to significant posterior lean (with some resistance to assistance to lean forward), pt unable to stand or walk at this time. Will continue to assess discharge needs as pt moves through CIWA protocol. Anticipate patient will benefit from PT to address problems listed below.Will continue to follow acutely to maximize functional mobility independence and safety.          Recommendations for follow up therapy are one component of a multi-disciplinary discharge planning process, led by the attending physician.  Recommendations may be updated based on patient status, additional functional criteria and insurance authorization.  Follow Up Recommendations Skilled nursing-short term rehab (<3 hours/day)    Assistance Recommended at Discharge Frequent or constant Supervision/Assistance  Patient can return home with the following  Two people to help with walking and/or transfers;Assistance with cooking/housework;Assistance with feeding;Direct supervision/assist for medications management;Direct supervision/assist for financial management;Assist for transportation;Help with stairs or ramp for entrance    Equipment Recommendations Rolling walker (2 wheels) (will continue to assess)  Recommendations for Other Services       Functional Status Assessment Patient has had a recent  decline in their functional status and demonstrates the ability to make significant improvements in function in a reasonable and predictable amount of time.     Precautions / Restrictions Precautions Precautions: Fall Restrictions Weight Bearing Restrictions: No      Mobility  Bed Mobility Overal bed mobility: Needs Assistance Bed Mobility: Rolling, Supine to Sit, Sit to Supine Rolling: Max assist, +2 for physical assistance, +2 for safety/equipment   Supine to sit: Max assist, +2 for physical assistance, +2 for safety/equipment Sit to supine: Max assist, +2 for physical assistance, +2 for safety/equipment   General bed mobility comments: maxA to roll R<>L in bed, with cues for sequencing. Pt appeared to have increased anxiety with movement and attempted to initiate resistance to rolling. maxA+2 to powerup into sitting EOB and for LE management, upon return to supine maxA for trunk and BLE    Transfers                   General transfer comment: not assessed due to pt and therapist stafety    Ambulation/Gait                  Stairs            Wheelchair Mobility    Modified Rankin (Stroke Patients Only)       Balance Overall balance assessment: Needs assistance Sitting-balance support: Feet supported, Bilateral upper extremity supported Sitting balance-Leahy Scale: Zero Sitting balance - Comments: pt requires maxA for stability sitting EOB, pt with posterior lean. He did demonstrate intermittent stints of minguard-minA for static sitting lasting about . Postural control: Posterior lean  Pertinent Vitals/Pain Pain Assessment Pain Assessment: No/denies pain    Home Living Family/patient expects to be discharged to:: Private residence Living Arrangements: Alone   Type of Home: House Home Access: Stairs to enter Entrance Stairs-Rails: Doctor, general practice of Steps: 5   Home  Layout: One level Home Equipment: None Additional Comments: pt is a poor historian, information above is from previous admission in 2017    Prior Function Prior Level of Function : Patient poor historian/Family not available               ADLs Comments: unsure, pt is a poor historian     Hand Dominance   Dominant Hand: Right    Extremity/Trunk Assessment   Upper Extremity Assessment Upper Extremity Assessment: Defer to OT evaluation    Lower Extremity Assessment Lower Extremity Assessment: Generalized weakness    Cervical / Trunk Assessment Cervical / Trunk Assessment: Normal  Communication   Communication: No difficulties  Cognition Arousal/Alertness: Awake/alert Behavior During Therapy: Flat affect, Anxious Overall Cognitive Status: Impaired/Different from baseline Area of Impairment: Orientation, Attention, Memory, Following commands, Safety/judgement, Problem solving, Awareness                 Orientation Level: Disoriented to, Place, Time, Situation Current Attention Level: Focused Memory: Decreased recall of precautions, Decreased short-term memory Following Commands: Follows one step commands inconsistently Safety/Judgement: Decreased awareness of safety, Decreased awareness of deficits Awareness: Intellectual Problem Solving: Slow processing, Decreased initiation, Difficulty sequencing, Requires tactile cues, Requires verbal cues General Comments: Pt required maxA with hand over hand and multimodal cues for sequencing with rolling in bed, pt appeared anxious with bed mobility. Pt required cues for postural correction while in bed, demonstrated poor righting reactions. Pt with nonsensical communication at times. was unable to provide his birthdate        General Comments General comments (skin integrity, edema, etc.): VSS per monitor    Exercises     Assessment/Plan    PT Assessment Patient needs continued PT services  PT Problem List Decreased  strength;Decreased activity tolerance;Decreased balance;Decreased mobility;Decreased coordination;Decreased cognition;Decreased knowledge of use of DME;Decreased safety awareness;Decreased knowledge of precautions       PT Treatment Interventions DME instruction;Gait training;Stair training;Functional mobility training;Therapeutic activities;Therapeutic exercise;Balance training;Cognitive remediation;Patient/family education    PT Goals (Current goals can be found in the Care Plan section)  Acute Rehab PT Goals Patient Stated Goal: uable to state PT Goal Formulation: Patient unable to participate in goal setting Time For Goal Achievement: 08/28/21 Potential to Achieve Goals: Fair    Frequency Min 3X/week (until SNF confirmed)     Co-evaluation PT/OT/SLP Co-Evaluation/Treatment: Yes Reason for Co-Treatment: Complexity of the patient's impairments (multi-system involvement);Necessary to address cognition/behavior during functional activity;For patient/therapist safety PT goals addressed during session: Mobility/safety with mobility;Balance OT goals addressed during session: ADL's and self-care       AM-PAC PT "6 Clicks" Mobility  Outcome Measure Help needed turning from your back to your side while in a flat bed without using bedrails?: Total Help needed moving from lying on your back to sitting on the side of a flat bed without using bedrails?: Total Help needed moving to and from a bed to a chair (including a wheelchair)?: Total Help needed standing up from a chair using your arms (e.g., wheelchair or bedside chair)?: Total Help needed to walk in hospital room?: Total Help needed climbing 3-5 steps with a railing? : Total 6 Click Score: 6    End of Session   Activity Tolerance:  Treatment limited secondary to medical complications (Comment) (DTs; shaking; confusion) Patient left: in bed;with call bell/phone within reach;with bed alarm set Nurse Communication: Mobility  status;Other (comment) (incontinent and cleaned up) PT Visit Diagnosis: Difficulty in walking, not elsewhere classified (R26.2)    Time: 2725-3664 PT Time Calculation (min) (ACUTE ONLY): 22 min   Charges:   PT Evaluation $PT Eval Low Complexity: 1 Low           Jerolyn Center, PT Acute Rehabilitation Services  Pager [phone] Office [phone]   Zena Amos 08/14/2021, 2:17 PM

## 2021-08-14 NOTE — Progress Notes (Signed)
? ?PROGRESS NOTE ? ?Luis Porter SWH:[id number] DOB: September 13, 1960 DOA: 08/13/2021 ?PCP: Olin Hauser, DO ? ?HPI/Recap of past 24 hours: ?  Luis Porter is a 61 y.o. male with medical history significant of hypertension, dilated cardiomyopathy, chronic combined systolic and diastolic CHF, and GERD presents due to altered mental status.  Much of the history is obtained from the patient's wife over the phone as she reports that he is a poor historian and does not know that anything is wrong.  She reports that over the last several weeks to months the patient has progressively declined.  She notes that the patient does not know what day or time it is and has significant short-term memory loss.  He has been drinking 18-24 beers per day on average and sometimes liquor.  Luis Porter had broken into the liquor cabinet in her home.  He lost his job approximately 10 months ago, has tickets on his car because his license plate is expired, and has not filed for disability.  Over the last week she reports that he has been urinating and defecating all over the house.  She reports that he will just fall and stare off and is not able to help himself up.  She is able to help him either and so he just sits there.  He fell last week and sustained a laceration to the back of his head, but she is not short of this happened. ?  ?The patient reports that he has intermittent chest pains, palpitations, nausea, intermittent vomiting, shortness of breath with exertion, intermittently has a dry coughs due to allergies, feels nervous, numbness in his toe, passes out, and has dizziness when getting up and moving around.  Denies any significant leg swelling or fever. ?  ?  ?Upon admission into the emergency department patient was noted to be afebrile, pulse 91-1 14, respirations 12-21, blood pressures elevated up to 145/96, and O2 saturation maintained on room air.  Labs significant for MCV 100.5, MCH 36, calcium 8.7,  anion gap 17, ionized calcium 1, AST 149, ALT 61, alcohol level 366, and high-sensitivity troponins 25-> 25.  CT scan of the head did not note any acute abnormalities.  Patient has been given 324 mg of aspirin, 1 mg of folic acid IV, 2 g of Ativan IV, 500 mg of thiamine IV, and 500 mL bolus of normal saline IV fluids. ? ?08/14/2021: Patient was seen and examined at his bedside.  He is alert oriented x3.  Jittery likely from alcohol withdrawal.  Started on Valium with taper. ?  ? ?Assessment/Plan: ?Principal Problem: ?  Alcohol abuse with intoxication, with delirium ([place]) ?Active Problems: ?  Essential hypertension ?  GERD (gastroesophageal reflux disease) ?  Elevated troponin ?  Syncope ?  Chronic combined systolic and diastolic heart failure ([place]) ?  Acute metabolic encephalopathy ?  Transaminitis ?  Macrocytosis ?  Frequent falls ?  Prolonged QT interval ? ?Alcohol abuse with intoxication and concern for alcohol withdrawal ?Alcohol level 366 on 08/13/2021.  On the following day patient has jitteriness this with concern for alcohol withdrawal ?Started on Valium with taper. ?Continue CIWA protocol ?Continue fall precautions, aspiration precautions. ?Low threshold for escalation of treatment with Precedex, if not responding to Valium ?TOC consulted to provide resources for alcohol cessation and abstinence. ?  ?Frequent falls/Syncope ?Patient reports that he feels dizzy and off-balance whenever standing up or moving around. ?-Check orthostatic vital signs ?-Held diuretic ?-Check vitamin B12 in a.m. ?-PT/OT to eval  and treat ?-Follow-up telemetry overnight ?Continue fall, aspiration, delirium precautions ?  ?Elevated troponin ?Acute.  High-sensitivity troponins 25-> 25.  Troponins were noted to be flat.  EKG without significant ischemic changes. ?-Continue to monitor ?  ?Elevated liver chemistries, likely secondary to alcohol abuse ?Chronic.  AST 149 and ALT 61.   Hepatitis panels as of 06/2020 are negative. ?AST to ALT  ratio consistent with alcohol abuse.  ?Continue to trend LFTs.  Downtrending. ?Continue to avoid hepatotoxic agents. ? ?Elevated troponin ?Denies any anginal symptoms ?Flat at 25. ?Continue to monitor on telemetry ? ?Hypokalemia ?Serum potassium 3.4 ?Repleted orally, serum magnesium 1.8. ?  ?Chronic combined systolic and diastolic heart failure ([place]) ?Last echocardiogram revealed EF of 35% with global hypokinesis in 03/2021.  Patient appears to be euvolemic at this time. ?-Monitor I&Os and daily weight ?-Continue beta-blocker and Entresto ?-Resume spironolactone when medically appropriate. ?  ?Essential hypertension ?On admission blood pressures elevated up to 157/87.  ?-Continue metoprolol and Entresto ?-Held spironolactone due to reports of syncope ?  ?Prolonged QT interval ?Acute.  QTc 547. ?-Avoid QT prolonging medications ?-Correct electrolyte abnormalities ?-Recheck EKG in a.m. ?  ?Macrocytosis ?Acute on chronic. Patient noted to have MCV 100.5 and MCH 36.  Thought secondary to patient's alcohol abuse. ?-Follow-up vitamin B12 level ?  ?GERD ?-Primacy substitution of Protonix ?  ?  ? Advance Care Planning:   Code Status: Full Code  ?  ?Consults: none ?  ?Family Communication: Wife updated over the phone ?  ?Severity of Illness: ? ? ? ? ?Code Status: Full code ? ?Family Communication: None at bedside ? ?Disposition Plan: Likely will discharge to home with home health services versus SNF prior to returning home. ? ? ?Consultants: ?None. ? ?Procedures: ?None. ? ?Antimicrobials: ?None. ? ?DVT prophylaxis: ?Subcu enoxaparin. ? ?Status is: Inpatient ?Patient requires at least 2 midnights for further evaluation and treatment of present condition ? ? ? ?Objective: ?Vitals:  ? 08/14/21 0800 08/14/21 1008 08/14/21 1143 08/14/21 1529  ?BP: (!) 154/11 (!) 142/99 (!) 147/91 (!) 143/97  ?Pulse: 86 94 93 94  ?Resp:   20 17  ?Temp:   97.8 ?F (36.6 ?C) 98.4 ?F (36.9 ?C)  ?TempSrc:   Oral Oral  ?SpO2:   94% 92%  ?Weight:       ?Height:      ? ? ?Intake/Output Summary (Last 24 hours) at 08/14/2021 1719 ?Last data filed at 08/14/2021 0841 ?Gross per 24 hour  ?Intake 220 ml  ?Output 600 ml  ?Net -380 ml  ? ?Filed Weights  ? 08/13/21 0923 08/14/21 0500  ?Weight: 65.8 kg 66.3 kg  ? ? ?Exam: ? ?General: 62 y.o. year-old male frail-appearing no acute distress.  He is alert and oriented x3.   ?Cardiovascular: Regular rate and rhythm no rubs or gallops. ?Respiratory: Clear to auscultation no wheezes or rales.   ?Abdomen: Soft  normal bowel sounds present.   ?Musculoskeletal: No lower extremity edema bilaterally.   ?Skin: No ulcerative lesions noted or rashes, ?Psychiatry: Mood is anxious.  Jitteriness, with concern for alcohol withdrawal. ? ? ?Data Reviewed: ?CBC: ?Recent Labs  ?Lab 08/13/21 ?1150 08/13/21 ?1202 08/14/21 ?0018  ?WBC 5.5  --  3.8*  ?NEUTROABS 2.8  --   --   ?HGB 15.3 16.0 13.2  ?HCT 42.7 47.0 37.1*  ?MCV 100.5*  --  100.8*  ?PLT 157  --  147*  ? ?Basic Metabolic Panel: ?Recent Labs  ?Lab 08/13/21 ?1150 08/13/21 ?1202 08/13/21 ?1930 08/14/21 ?0018  ?  NA 140 139  --  137  ?K 3.7 3.8  --  3.4*  ?CL 100  --   --  103  ?CO2 23  --   --  24  ?GLUCOSE 76  --   --  107*  ?BUN <5*  --   --  5*  ?CREATININE 0.63  --   --  0.56*  ?CALCIUM 8.7*  --   --  8.8*  ?MG  --   --  1.8  --   ?PHOS  --   --  3.5  --   ? ?GFR: ?Estimated Creatinine Clearance: 93 mL/min (A) (by C-G formula based on SCr of 0.56 mg/dL (L)). ?Liver Function Tests: ?Recent Labs  ?Lab 08/13/21 ?1150 08/14/21 ?0018  ?AST 149* 127*  ?ALT 61* 52*  ?ALKPHOS 59 55  ?BILITOT 0.8 0.9  ?PROT 6.9 6.2*  ?ALBUMIN 3.8 3.2*  ? ?No results for input(s): LIPASE, AMYLASE in the last 168 hours. ?Recent Labs  ?Lab 08/13/21 ?1150  ?AMMONIA 32  ? ?Coagulation Profile: ?No results for input(s): INR, PROTIME in the last 168 hours. ?Cardiac Enzymes: ?No results for input(s): CKTOTAL, CKMB, CKMBINDEX, TROPONINI in the last 168 hours. ?BNP (last 3 results) ?No results for input(s): PROBNP in the last  8760 hours. ?HbA1C: ?No results for input(s): HGBA1C in the last 72 hours. ?CBG: ?No results for input(s): GLUCAP in the last 168 hours. ?Lipid Profile: ?No results for input(s): CHOL, HDL, LDLCALC, TRIG, CHOL

## 2021-08-14 NOTE — Evaluation (Signed)
Occupational Therapy Evaluation ?Patient Details ?Name: Luis Porter ?MRN: [id number] ?DOB: January 13, 1961 ?Today's Date: 08/14/2021 ? ? ?History of Present Illness 61 y.o. male  presents 08/13/21 due to altered mental status. +ETOH abuse (18-24 beers/day). Recent fall with laceration to back of head.  PMH significant of hypertension, dilated cardiomyopathy, chronic combined systolic and diastolic CHF, and GERD  ? ?Clinical Impression ?  ?Pt is a poor historian and unable to provide prior level of functioning. He currently demonstrates limitations with stability, cognition, activity tolerance, strength and coordination impacting his safety and independence with ADL/IADL. He requires maxA+2 for bed mobility and maxA for stability sitting EOB. Pt's gown and linens were saturated upon therapists' arrival. Assisted pt with washup and linen change. Pt will continue to benefit from skilled OT services to maximize safety and independence with ADL/IADL and functional mobility. Will continue to follow acutely and progress as tolerated.  ?  ?   ? ?Recommendations for follow up therapy are one component of a multi-disciplinary discharge planning process, led by the attending physician.  Recommendations may be updated based on patient status, additional functional criteria and insurance authorization.  ? ?Follow Up Recommendations ? Skilled nursing-short term rehab (<3 hours/day)  ?  ?Assistance Recommended at Discharge Frequent or constant Supervision/Assistance  ?Patient can return home with the following A lot of help with walking and/or transfers;A lot of help with bathing/dressing/bathroom;Two people to help with bathing/dressing/bathroom;Direct supervision/assist for medications management;Direct supervision/assist for financial management ? ?  ?Functional Status Assessment ? Patient has had a recent decline in their functional status and demonstrates the ability to make significant improvements in function in a  reasonable and predictable amount of time.  ?Equipment Recommendations ?  (TBD)  ?  ?Recommendations for Other Services   ? ? ?  ?Precautions / Restrictions Precautions ?Precautions: Fall ?Restrictions ?Weight Bearing Restrictions: No  ? ?  ? ?Mobility Bed Mobility ?Overal bed mobility: Needs Assistance ?Bed Mobility: Rolling, Supine to Sit, Sit to Supine ?Rolling: Max assist, +2 for physical assistance, +2 for safety/equipment ?  ?Supine to sit: Max assist, +2 for physical assistance, +2 for safety/equipment ?Sit to supine: Max assist, +2 for physical assistance, +2 for safety/equipment ?  ?General bed mobility comments: maxA to roll R<>L in bed, with cues for sequencing. Pt appeared to have increased anxiety with movement and attempted to initiate resistance to rolling. maxA+2 to powerup into sitting EOB and for LE management, upon return to supine maxA for trunk and BLE ?  ? ?Transfers ?  ?  ?  ?  ?  ?  ?  ?  ?  ?General transfer comment: not assessed due to pt and therapist stafety ?  ? ?  ?Balance Overall balance assessment: Needs assistance ?Sitting-balance support: Feet supported, Bilateral upper extremity supported ?Sitting balance-Leahy Scale: Zero ?Sitting balance - Comments: pt requires maxA for stability sitting EOB, pt with posterior lean. He did demonstrate intermittent stints of minguard-minA for static sitting lasting about 59mn. ?Postural control: Posterior lean ?  ?  ?  ?  ?  ?  ?  ?  ?  ?  ?  ?  ?  ?  ?   ? ?ADL either performed or assessed with clinical judgement  ? ?ADL Overall ADL's : Needs assistance/impaired ?  ?  ?  ?  ?  ?  ?  ?  ?  ?  ?  ?  ?  ?  ?  ?  ?  ?  ?  ?  General ADL Comments: pt currently requires maxA for all ADL. Pt was saturated in urine upon arrival and required assistance with linen change, gown change, washing UB/LB. Pt did assist with washing LB but required maxA for stability while sitting EOB. Pt limited by anxiety/fear of falling, decreased proprioception, and  ataxic/tremulous movements  ? ? ? ?Vision   ?   ?   ?Perception   ?  ?Praxis   ?  ? ?Pertinent Vitals/Pain Pain Assessment ?Pain Assessment: No/denies pain  ? ? ? ?Hand Dominance Right ?  ?Extremity/Trunk Assessment Upper Extremity Assessment ?Upper Extremity Assessment: Generalized weakness (ataxic, did not formally assess secondary to congition. with pulling/pushing functionall gross MMT 5/5) ?  ?Lower Extremity Assessment ?Lower Extremity Assessment: Defer to PT evaluation ?  ?  ?  ?Communication Communication ?Communication: No difficulties ?  ?Cognition Arousal/Alertness: Awake/alert ?Behavior During Therapy: Flat affect, Anxious ?Overall Cognitive Status: Impaired/Different from baseline ?Area of Impairment: Orientation, Attention, Memory, Following commands, Safety/judgement, Problem solving, Awareness ?  ?  ?  ?  ?  ?  ?  ?  ?Orientation Level: Disoriented to, Place, Time, Situation ?Current Attention Level: Focused ?Memory: Decreased recall of precautions, Decreased short-term memory ?Following Commands: Follows one step commands inconsistently ?Safety/Judgement: Decreased awareness of safety, Decreased awareness of deficits ?Awareness: Intellectual ?Problem Solving: Slow processing, Decreased initiation, Difficulty sequencing, Requires tactile cues, Requires verbal cues ?General Comments: Pt required maxA with hand over hand and multimodal cues for sequencing with rolling in bed, pt appeared anxious with bed mobility. Pt required cues for postural correction while in bed, demonstrated poor righting reactions. Pt with nonsensical communication at times. was unable to provide his birthdate ?  ?  ?General Comments  VSS ? ?  ?Exercises   ?  ?Shoulder Instructions    ? ? ?Home Living Family/patient expects to be discharged to:: Private residence ?Living Arrangements: Alone ?  ?Type of Home: House ?Home Access: Stairs to enter ?Entrance Stairs-Number of Steps: 5 ?Entrance Stairs-Rails: Right;Left ?Home Layout:  One level ?  ?  ?Bathroom Shower/Tub: Tub/shower unit ?  ?  ?Bathroom Accessibility: Yes ?  ?Home Equipment: None ?  ?Additional Comments: pt is a poor historian, information above is from previous admission in 2017 ?  ? ?  ?Prior Functioning/Environment   ?  ?  ?  ?  ?  ?  ?  ?ADLs Comments: unsure, pt is a poor historian ?  ? ?  ?  ?OT Problem List: Decreased activity tolerance;Impaired balance (sitting and/or standing);Decreased safety awareness;Decreased cognition ?  ?   ?OT Treatment/Interventions: Self-care/ADL training;Therapeutic exercise;DME and/or AE instruction;Cognitive remediation/compensation;Balance training;Patient/family education  ?  ?OT Goals(Current goals can be found in the care plan section) Acute Rehab OT Goals ?Patient Stated Goal: pt didn't state ?OT Goal Formulation: With patient ?Time For Goal Achievement: 08/28/21 ?Potential to Achieve Goals: Good ?ADL Goals ?Pt Will Perform Lower Body Dressing: with modified independence;sit to/from stand ?Pt Will Transfer to Toilet: with modified independence;ambulating ?Additional ADL Goal #1: Pt will demonstrate anticipatory awareness for safe engagement in ADL/IADL and functional mobility. ?Additional ADL Goal #2: Pt will complete multistep cog task with <3 errors and minimal cues.  ?OT Frequency: Min 2X/week ?  ? ?Co-evaluation PT/OT/SLP Co-Evaluation/Treatment: Yes ?Reason for Co-Treatment: Complexity of the patient's impairments (multi-system involvement);For patient/therapist safety;To address functional/ADL transfers ?  ?OT goals addressed during session: ADL's and self-care ?  ? ?  ?AM-PAC OT "6 Clicks" Daily Activity     ?Outcome Measure Help from another  person eating meals?: A Little ?Help from another person taking care of personal grooming?: A Lot ?Help from another person toileting, which includes using toliet, bedpan, or urinal?: A Lot ?Help from another person bathing (including washing, rinsing, drying)?: A Lot ?Help from another  person to put on and taking off regular upper body clothing?: A Lot ?Help from another person to put on and taking off regular lower body clothing?: A Lot ?6 Click Score: 13 ?  ?End of Session Nurse Communication: Mobility st

## 2021-08-15 DIAGNOSIS — F10121 Alcohol abuse with intoxication delirium: Secondary | ICD-10-CM | POA: Diagnosis not present

## 2021-08-15 MED ORDER — SPIRONOLACTONE 25 MG PO TABS
25.0000 mg | ORAL_TABLET | Freq: Every day | ORAL | Status: DC
Start: 2021-08-15 — End: 2021-08-22
  Administered 2021-08-15 – 2021-08-22 (×8): 25 mg via ORAL

## 2021-08-15 MED ORDER — DIAZEPAM 5 MG PO TABS
5.0000 mg | ORAL_TABLET | Freq: Every day | ORAL | Status: AC
  Administered 2021-08-18: 5 mg via ORAL

## 2021-08-15 MED ORDER — DIAZEPAM 2 MG PO TABS
2.0000 mg | ORAL_TABLET | Freq: Every day | ORAL | Status: AC
  Administered 2021-08-19: 2 mg via ORAL

## 2021-08-15 MED ORDER — DIAZEPAM 5 MG PO TABS
5.0000 mg | ORAL_TABLET | Freq: Two times a day (BID) | ORAL | Status: AC
  Administered 2021-08-16 – 2021-08-18 (×4): 5 mg via ORAL

## 2021-08-15 MED ORDER — THIAMINE HCL 100 MG/ML IJ SOLN
500.0000 mg | Freq: Once | INTRAVENOUS | Status: AC
  Administered 2021-08-15: 500 mg via INTRAVENOUS
  Filled 2021-08-15: qty 5

## 2021-08-15 MED ORDER — METOPROLOL TARTRATE 5 MG/5ML IV SOLN: 2.5000 mg | Freq: Four times a day (QID) | INTRAVENOUS | Status: DC | PRN

## 2021-08-15 MED ORDER — DIAZEPAM 5 MG PO TABS
10.0000 mg | ORAL_TABLET | Freq: Three times a day (TID) | ORAL | Status: AC
  Administered 2021-08-15 – 2021-08-16 (×4): 10 mg via ORAL

## 2021-08-15 MED ORDER — METOPROLOL TARTRATE 5 MG/5ML IV SOLN
2.5000 mg | Freq: Four times a day (QID) | INTRAVENOUS | Status: DC | PRN
  Administered 2021-08-19: 2.5 mg via INTRAVENOUS

## 2021-08-15 MED ORDER — POTASSIUM CHLORIDE CRYS ER 20 MEQ PO TBCR
40.0000 meq | EXTENDED_RELEASE_TABLET | Freq: Once | ORAL | Status: AC
Start: 2021-08-15 — End: 2021-08-15
  Administered 2021-08-15: 40 meq via ORAL

## 2021-08-15 MED ORDER — POTASSIUM CHLORIDE CRYS ER 20 MEQ PO TBCR
40.0000 meq | EXTENDED_RELEASE_TABLET | Freq: Once | ORAL | Status: AC
  Administered 2021-08-15: 40 meq via ORAL

## 2021-08-15 MED ORDER — SACUBITRIL-VALSARTAN 24-26 MG PO TABS
1.0000 | ORAL_TABLET | Freq: Two times a day (BID) | ORAL | Status: DC
  Administered 2021-08-16 – 2021-08-22 (×13): 1 via ORAL
  Filled 2021-08-15 (×14): qty 1

## 2021-08-15 MED ORDER — TRAZODONE HCL 50 MG PO TABS
50.0000 mg | ORAL_TABLET | Freq: Every day | ORAL | Status: DC
Start: 2021-08-15 — End: 2021-08-22
  Administered 2021-08-15 – 2021-08-21 (×7): 50 mg via ORAL

## 2021-08-15 NOTE — Progress Notes (Signed)
Received a call from bedside RN regarding CIWA scores up trending.  Valium dose increased.  Patient with hallucination with concern for delirium tremens.  He may require Precedex infusion.  PCCM consulted to assist with the management of his alcohol withdrawal symptoms. ? ?Updated his partner Ms. Luis Porter via phone.  All questions answered to the best of my ability. ?

## 2021-08-15 NOTE — Progress Notes (Signed)
? ?PROGRESS NOTE ? ?Luis Porter JME:[id number] DOB: 08-Oct-1960 DOA: 08/13/2021 ?PCP: Olin Hauser, DO ? ?HPI/Recap of past 24 hours: ?Luis Porter is a 61 y.o. male with medical history significant of hypertension, dilated cardiomyopathy, chronic combined systolic and diastolic CHF, alcohol abuse, and GERD presents due to [facility] from home with altered mental status.  Work-up revealed alcohol intoxication and concern for alcohol withdrawal.  Patient was severely jittery on admission.  CT head unremarkable for any intracranial abnormalities.  Patient was placed on CIWA protocol as well as Valium taper. ? ?08/15/2021: Patient was seen and examined at his bedside.  His jitteriness is mildly improved.  Denies any abdominal pain or nausea.  Endorses still very weak on his feet.  Seen by PT OT with recommendation for SNF. ? ?Assessment/Plan: ?Principal Problem: ?  Alcohol abuse with intoxication, with delirium ([place]) ?Active Problems: ?  Essential hypertension ?  GERD (gastroesophageal reflux disease) ?  Elevated troponin ?  Syncope ?  Chronic combined systolic and diastolic heart failure ([place]) ?  Acute metabolic encephalopathy ?  Transaminitis ?  Macrocytosis ?  Frequent falls ?  Prolonged QT interval ? ?Alcohol abuse with intoxication and concern for alcohol withdrawal ?Alcohol level 366 on 08/13/2021.  On the following day patient has jitteriness this with concern for alcohol withdrawal ?Continue Valium with taper. ?Continue CIWA protocol ?Continue multivitamins, high-dose IV thiamine, folic acid supplement. ?Continue fall precautions, aspiration precautions. ?Low threshold for escalation of treatment with Precedex, if not responding to Valium ?TOC consulted to provide resources for alcohol cessation and abstinence. ?  ?Frequent falls/Syncope ?Patient reports that he feels dizzy and off-balance whenever standing up or moving around. ?Obtain orthostatic vital signs tomorrow if stable on his feet.   High fall risk. ?Continue PT OT with assistance and fall precautions. ?  ?Elevated troponin, flat ?Acute.  High-sensitivity troponins 25-> 25.  Troponins were noted to be flat.  EKG without significant ischemic changes. ?Patient denies any anginal symptoms. ?  ?Elevated liver chemistries, likely secondary to alcohol abuse ?Chronic.  AST 149 and ALT 61.   Hepatitis panels as of 06/2020 are negative. ?AST to ALT ratio consistent with alcohol abuse.  ?Continue to trend LFTs.  Downtrending. ?Continue to avoid hepatotoxic agents. ? ?Refractory Hypokalemia ?Serum potassium 3.4 ?Repleted orally, serum magnesium 1.8. ?  ?Chronic combined systolic and diastolic heart failure ([place]) ?Last echocardiogram revealed EF of 35% with global hypokinesis in 03/2021.   ?Patient appears to be euvolemic at this time. ?Continue monitor I&Os and daily weight ?Continue beta-blocker and Entresto ?Continue spironolactone when medically appropriate. ?  ?Essential hypertension ?-Continue metoprolol and Entresto ?-Continue spironolactone due to reports of syncope ?  ?Prolonged QT interval ?Acute.  QTc 547. ?-Continue to avoid QT prolonging medications ?-Continue to correct electrolyte abnormalities ?Repeat EKG QTc 519. ?  ?Macrocytosis ?Acute on chronic. Patient noted to have MCV 100.5 and MCH 36.  Thought secondary to patient's chronic alcohol abuse. ?  ?GERD ?-Primacy substitution of Protonix ? ? ?  ?  ? Advance Care Planning:   Code Status: Full Code  ?  ?Consults: none ?  ?Family Communication: Wife updated over the phone ?  ?Severity of Illness: ? ? ? ? ?Code Status: Full code ? ?Family Communication: Updated his wife via phone. ? ?Disposition Plan: Likely will discharge to home with home health services versus SNF prior to returning home. ? ? ?Consultants: ?None. ? ?Procedures: ?None. ? ?Antimicrobials: ?None. ? ?DVT prophylaxis: ?Subcu enoxaparin. ? ?Status is: Inpatient ?  Patient requires at least 2 midnights for further evaluation and  treatment of present condition ? ? ? ?Objective: ?Vitals:  ? 08/15/21 0732 08/15/21 1010 08/15/21 1100 08/15/21 1140  ?BP: (!) 137/97 (!) 120/92  123/87  ?Pulse: 100 (!) 105 (!) 104 (!) 101  ?Resp: 19   18  ?Temp: 98 ?F (36.7 ?C)   97.7 ?F (36.5 ?C)  ?TempSrc: Oral   Oral  ?SpO2: 94%   95%  ?Weight:      ?Height:      ? ? ?Intake/Output Summary (Last 24 hours) at 08/15/2021 1227 ?Last data filed at 08/15/2021 1118 ?Gross per 24 hour  ?Intake --  ?Output 200 ml  ?Net -200 ml  ? ?Filed Weights  ? 08/13/21 0923 08/14/21 0500 08/15/21 0500  ?Weight: 65.8 kg 66.3 kg 66.2 kg  ? ? ?Exam: ? ?General: 61 y.o. year-old male frail-appearing in no acute distress.  He is alert and oriented x3.   ?Cardiovascular: Regular rate and rhythm no rubs gallops.  ?Respiratory: Clear to auscultation no wheezes or rales.   ?Abdomen: Soft nontender normal bowel sounds present. ?Musculoskeletal: No lower extremity edema bilaterally. ?Skin: No ulcerative lesions noted. ?Psychiatry: Mood is appropriate for condition secondary ? ? ?Data Reviewed: ?CBC: ?Recent Labs  ?Lab 08/13/21 ?1150 08/13/21 ?1202 08/14/21 ?0018  ?WBC 5.5  --  3.8*  ?NEUTROABS 2.8  --   --   ?HGB 15.3 16.0 13.2  ?HCT 42.7 47.0 37.1*  ?MCV 100.5*  --  100.8*  ?PLT 157  --  147*  ? ?Basic Metabolic Panel: ?Recent Labs  ?Lab 08/13/21 ?1150 08/13/21 ?1202 08/13/21 ?1930 08/14/21 ?0018  ?NA 140 139  --  137  ?K 3.7 3.8  --  3.4*  ?CL 100  --   --  103  ?CO2 23  --   --  24  ?GLUCOSE 76  --   --  107*  ?BUN <5*  --   --  5*  ?CREATININE 0.63  --   --  0.56*  ?CALCIUM 8.7*  --   --  8.8*  ?MG  --   --  1.8  --   ?PHOS  --   --  3.5  --   ? ?GFR: ?Estimated Creatinine Clearance: 93 mL/min (A) (by C-G formula based on SCr of 0.56 mg/dL (L)). ?Liver Function Tests: ?Recent Labs  ?Lab 08/13/21 ?1150 08/14/21 ?0018  ?AST 149* 127*  ?ALT 61* 52*  ?ALKPHOS 59 55  ?BILITOT 0.8 0.9  ?PROT 6.9 6.2*  ?ALBUMIN 3.8 3.2*  ? ?No results for input(s): LIPASE, AMYLASE in the last 168 hours. ?Recent  Labs  ?Lab 08/13/21 ?1150  ?AMMONIA 32  ? ?Coagulation Profile: ?No results for input(s): INR, PROTIME in the last 168 hours. ?Cardiac Enzymes: ?No results for input(s): CKTOTAL, CKMB, CKMBINDEX, TROPONINI in the last 168 hours. ?BNP (last 3 results) ?No results for input(s): PROBNP in the last 8760 hours. ?HbA1C: ?No results for input(s): HGBA1C in the last 72 hours. ?CBG: ?No results for input(s): GLUCAP in the last 168 hours. ?Lipid Profile: ?No results for input(s): CHOL, HDL, LDLCALC, TRIG, CHOLHDL, LDLDIRECT in the last 72 hours. ?Thyroid Function Tests: ?No results for input(s): TSH, T4TOTAL, FREET4, T3FREE, THYROIDAB in the last 72 hours. ?Anemia Panel: ?Recent Labs  ?  08/14/21 ?0018  ?VITAMINB12 1,002*  ? ?Urine analysis: ?   ?Component Value Date/Time  ? COLORURINE YELLOW 08/13/2021 1050  ? APPEARANCEUR CLEAR 08/13/2021 1050  ? LABSPEC 1.005 08/13/2021 1050  ?  PHURINE 7.0 08/13/2021 1050  ? GLUCOSEU NEGATIVE 08/13/2021 1050  ? HGBUR SMALL (A) 08/13/2021 1050  ? [place] NEGATIVE 08/13/2021 1050  ? KETONESUR 20 (A) 08/13/2021 1050  ? PROTEINUR NEGATIVE 08/13/2021 1050  ? NITRITE NEGATIVE 08/13/2021 1050  ? LEUKOCYTESUR NEGATIVE 08/13/2021 1050  ? ?Sepsis Labs: ?'@LABRCNTIP'$ (procalcitonin:4,lacticidven:4) ? ?) ?Recent Results (from the past 240 hour(s))  ?Resp Panel by RT-PCR (Flu A&B, Covid) Nasopharyngeal Swab     Status: None  ? Collection Time: 08/13/21  1:58 PM  ? Specimen: Nasopharyngeal Swab; Nasopharyngeal(NP) swabs in vial transport medium  ?Result Value Ref Range Status  ? SARS Coronavirus 2 by RT PCR NEGATIVE NEGATIVE Final  ?  Comment: (NOTE) ?SARS-CoV-2 target nucleic acids are NOT DETECTED. ? ?The SARS-CoV-2 RNA is generally detectable in upper respiratory ?specimens during the acute phase of infection. The lowest ?concentration of SARS-CoV-2 viral copies this assay can detect is ?138 copies/mL. A negative result does not preclude SARS-Cov-2 ?infection and should not be used as the sole basis  for treatment or ?other patient management decisions. A negative result may occur with  ?improper specimen collection/handling, submission of specimen other ?than nasopharyngeal swab, presence of viral mut

## 2021-08-15 NOTE — Progress Notes (Signed)
Patient's  CIWA scores trending up. Dr. Nevada Crane Notified new orders received.  ?

## 2021-08-15 NOTE — Progress Notes (Signed)
Pt confused to time, situation and place. Pt took his tele off stated he was going to get up to take a shower. Pt has not been out of bed unstable and weak with tremors at times. Pt instructed to leave telemetry on was ordered by his doctor and to remain in bed unable to shower till doctor releases him to do this. Pt agreed reluctantly to comply. Pt speaking on phone.  ?

## 2021-08-16 DIAGNOSIS — F10121 Alcohol abuse with intoxication delirium: Secondary | ICD-10-CM | POA: Diagnosis not present

## 2021-08-16 LAB — COMPREHENSIVE METABOLIC PANEL
ALT: 50 U/L — ABNORMAL HIGH (ref 0–44)
AST: 84 U/L — ABNORMAL HIGH (ref 15–41)
Albumin: 3.3 g/dL — ABNORMAL LOW (ref 3.5–5.0)
Alkaline Phosphatase: 68 U/L (ref 38–126)
Anion gap: 10 (ref 5–15)
BUN: 8 mg/dL (ref 6–20)
CO2: 20 mmol/L — ABNORMAL LOW (ref 22–32)
Calcium: 9.4 mg/dL (ref 8.9–10.3)
Chloride: 103 mmol/L (ref 98–111)
Creatinine, Ser: 0.67 mg/dL (ref 0.61–1.24)
GFR, Estimated: >60 mL/min (ref >60)
Glucose, Bld: 97 mg/dL (ref 70–99)
Potassium: 3.8 mmol/L (ref 3.5–5.1)
Sodium: 133 mmol/L — ABNORMAL LOW (ref 135–145)
Total Bilirubin: 1.5 mg/dL — ABNORMAL HIGH (ref 0.3–1.2)
Total Protein: 6.6 g/dL (ref 6.5–8.1)

## 2021-08-16 LAB — CBC
HCT: 48.2 % (ref 39.0–52.0)
Hemoglobin: 16.6 g/dL (ref 13.0–17.0)
MCH: 35.6 pg — ABNORMAL HIGH (ref 26.0–34.0)
MCHC: 34.4 g/dL (ref 30.0–36.0)
MCV: 103.4 fL — ABNORMAL HIGH (ref 80.0–100.0)
Platelets: 149 10*3/uL — ABNORMAL LOW (ref 150–400)
RBC: 4.66 MIL/uL (ref 4.22–5.81)
RDW: 13.4 % (ref 11.5–15.5)
WBC: 6.9 10*3/uL (ref 4.0–10.5)
nRBC: 0 % (ref 0.0–0.2)

## 2021-08-16 MED ORDER — SCOPOLAMINE 1 MG/3DAYS TD PT72
1.0000 | MEDICATED_PATCH | TRANSDERMAL | Status: DC
  Administered 2021-08-17: 1.5 mg via TRANSDERMAL
  Filled 2021-08-16: qty 1

## 2021-08-16 NOTE — Progress Notes (Signed)
?  Transition of Care (TOC) Screening Note ? ? ?Patient Details  ?Name: Luis Porter ?Date of Birth: 07/25/1960 ? ? ?Transition of Care (TOC) CM/SW Contact:    ?Benard Halsted, LCSW ?Phone Number: ?08/16/2021, 9:19 AM ? ? ? ?Transition of Care Department [facility]) has reviewed patient and will speak with patient when he is more oriented. If new patient transition needs arise, please place a TOC consult. ? ? ?

## 2021-08-16 NOTE — Progress Notes (Signed)
Physical Therapy Treatment ?Patient Details ?Name: Luis Porter ?MRN: [id number] ?DOB: May 26, 1961 ?Today's Date: 08/16/2021 ? ? ?History of Present Illness 61 y.o. male  presents 08/13/21 due to altered mental status. +ETOH abuse (18-24 beers/day). Recent fall with laceration to back of head.  PMH significant of hypertension, dilated cardiomyopathy, chronic combined systolic and diastolic CHF, and GERD ? ?  ?PT Comments  ? ? Patient eager to get up and walk. Remains shakey, however much better than when seen on 3/18. Able to walk with one person min assist to/from bathroom. Cognitively continues to need near constant supervision. Upgraded discharge plan to [place] if family can provide needed supervision due to decr cognition. ?   ?Recommendations for follow up therapy are one component of a multi-disciplinary discharge planning process, led by the attending physician.  Recommendations may be updated based on patient status, additional functional criteria and insurance authorization. ? ?Follow Up Recommendations ? Home health PT (if family/wife can provide supervision/minguard assist) ?  ?  ?Assistance Recommended at Discharge Frequent or constant Supervision/Assistance  ?Patient can return home with the following Assistance with cooking/housework;Assistance with feeding;Direct supervision/assist for medications management;Direct supervision/assist for financial management;A little help with walking and/or transfers;Help with stairs or ramp for entrance ?  ?Equipment Recommendations ? None recommended by PT  ?  ?Recommendations for Other Services   ? ? ?  ?Precautions / Restrictions Precautions ?Precautions: Fall ?Restrictions ?Weight Bearing Restrictions: No  ?  ? ?Mobility ? Bed Mobility ?Overal bed mobility: Needs Assistance ?Bed Mobility: Supine to Sit ?  ?  ?Supine to sit: Min guard ?  ?  ?  ?  ? ?Transfers ?Overall transfer level: Needs assistance ?Equipment used: 1 person hand held assist ?Transfers: Sit  to/from Stand ?Sit to Stand: Min assist ?  ?  ?  ?  ?  ?General transfer comment: pt's legs remain shakey, however rest of body tremors have resolved ?  ? ?Ambulation/Gait ?Ambulation/Gait assistance: Min assist ?Gait Distance (Feet): 25 Feet (toileted; 25) ?Assistive device: 1 person hand held assist ?Gait Pattern/deviations: Step-through pattern, Decreased stride length, Wide base of support (bil feet turn inward from birth per pt) ?  ?  ?  ?General Gait Details: slightly shakey but with no overt imbalance ? ? ?Stairs ?  ?  ?  ?  ?  ? ? ?Wheelchair Mobility ?  ? ?Modified Rankin (Stroke Patients Only) ?  ? ? ?  ?Balance Overall balance assessment: Needs assistance ?Sitting-balance support: Feet supported, No upper extremity supported ?Sitting balance-Leahy Scale: Good ?  ?  ?Standing balance support: Single extremity supported ?Standing balance-Leahy Scale: Poor ?  ?  ?  ?  ?  ?  ?  ?  ?  ?  ?  ?  ?  ? ?  ?Cognition Arousal/Alertness: Awake/alert ?Behavior During Therapy: [facility] for tasks assessed/performed, Impulsive ?Overall Cognitive Status: No family/caregiver present to determine baseline cognitive functioning ?Area of Impairment: Awareness, Safety/judgement, Problem solving ?  ?  ?  ?  ?  ?  ?  ?  ?Orientation Level:  (NT) ?Current Attention Level: Sustained ?  ?Following Commands: Follows one step commands inconsistently ?Safety/Judgement: Decreased awareness of safety, Decreased awareness of deficits ?Awareness: Intellectual ?Problem Solving: Slow processing, Decreased initiation, Difficulty sequencing, Requires tactile cues, Requires verbal cues ?General Comments: Patient anxious to get OOB and walk; impulsively moving to EOB despite cues to stop and wait for lines to be untangled; moving to wrong side of bed and required repeated cues to  redirect to move to correct side; pt given washcloth to wipe his bottom and threw it in the toilet--brought to his attention and instructed to hand 2nd washcloth to PT and  again threw it in the toilet ?  ?  ? ?  ?Exercises   ? ?  ?General Comments   ?  ?  ? ?Pertinent Vitals/Pain Pain Assessment ?Pain Assessment: No/denies pain  ? ? ?Home Living Family/patient expects to be discharged to:: Private residence ?Living Arrangements: Alone ?  ?Type of Home: House ?Home Access: Stairs to enter ?Entrance Stairs-Rails: Right;Left ?Entrance Stairs-Number of Steps: 5 ?  ?Home Layout: One level ?Home Equipment: None ?Additional Comments: pt is a poor historian, information above is from previous admission in 2017  ?  ?Prior Function    ?  ?  ?   ? ?PT Goals (current goals can now be found in the care plan section) Acute Rehab PT Goals ?Patient Stated Goal: wants to return home wtih wife ?Time For Goal Achievement: 08/28/21 ?Potential to Achieve Goals: Fair ?Progress towards PT goals: Progressing toward goals ? ?  ?Frequency ? ? ? Min 3X/week ? ? ? ?  ?PT Plan Discharge plan needs to be updated  ? ? ?Co-evaluation   ?  ?  ?  ?  ? ?  ?AM-PAC PT "6 Clicks" Mobility   ?Outcome Measure ? Help needed turning from your back to your side while in a flat bed without using bedrails?: None ?Help needed moving from lying on your back to sitting on the side of a flat bed without using bedrails?: None ?Help needed moving to and from a bed to a chair (including a wheelchair)?: A Little ?Help needed standing up from a chair using your arms (e.g., wheelchair or bedside chair)?: A Little ?Help needed to walk in hospital room?: A Little ?Help needed climbing 3-5 steps with a railing? : A Little ?6 Click Score: 20 ? ?  ?End of Session Equipment Utilized During Treatment: Gait belt ?Activity Tolerance: Patient tolerated treatment well ?Patient left: with call bell/phone within reach;in chair;with chair alarm set ?Nurse Communication: Mobility status ?PT Visit Diagnosis: Difficulty in walking, not elsewhere classified (R26.2) ?  ? ? ?Time: 5397-6734 ?PT Time Calculation (min) (ACUTE ONLY): 23 min ? ?Charges:  $Gait  Training: 8-22 mins ?$Therapeutic Activity: 8-22 mins          ?          ? ? ?Arby Barrette, PT ?Acute Rehabilitation Services  ?Pager [phone] ?Office [phone] ? ? ? ?Jeanie Cooks Emileigh Kellett ?08/16/2021, 3:53 PM ? ?

## 2021-08-16 NOTE — [place] FL2 (Signed)
?[place] MEDICAID FL2 LEVEL OF CARE SCREENING TOOL  ?  ? ?IDENTIFICATION  ?Patient Name: ?Luis Porter Birthdate: Sep 12, 1960 Sex: male Admission Date (Current Location): ?08/13/2021  ?[place] and [place] Number: ? [place] ?  Facility and Address:  ?[facility] [place] [address] [place], [place] [address] ?     Provider Number: ?[id number]  ?Attending Physician Name and Address:  ?Kayleen Memos, DO ? Relative Name and Phone Number:  ?  ?   ?Current Level of Care: ?Hospital Recommended Level of Care: ?[place] Prior Approval Number: ?  ? ?Date Approved/Denied: ?  PASRR Number: ?[id number] A ? ?Discharge Plan: ?SNF ?  ? ?Current Diagnoses: ?Patient Active Problem List  ? Diagnosis Date Noted  ? Acute metabolic encephalopathy 16/60/6301  ? Transaminitis 08/13/2021  ? Macrocytosis 08/13/2021  ? Frequent falls 08/13/2021  ? Alcohol abuse with intoxication, with delirium ([place]) 08/13/2021  ? Prolonged QT interval 08/13/2021  ? Alcohol withdrawal delirium ([place]) 02/10/2021  ? Postural dizziness with presyncope 01/31/2021  ? Hypernatremia 01/31/2021  ? Chronic combined systolic and diastolic heart failure ([place]) 01/31/2021  ? Syncope   ? Dilated cardiomyopathy ([place])   ? Elevated troponin 01/16/2021  ? Alcohol induced fatty liver 08/04/2020  ? Chronic alcoholic gastritis without hemorrhage 08/04/2020  ? Alcohol dependence in early full remission ([place]) 08/04/2020  ? Angioedema 07/28/2020  ? Alcohol withdrawal syndrome with complication ([place])   ? Abdominal pain, epigastric   ? GERD (gastroesophageal reflux disease) 09/19/2019  ? Acute diverticulitis 09/19/2019  ? Alcohol abuse 09/19/2019  ? Essential tremor 07/04/2019  ? Hx of adenomatous colonic polyps 01/06/2017  ? Herniated lumbar intervertebral disc 05/18/2016  ? Mixed hyperlipidemia 05/06/2014  ? BPH with obstruction/lower urinary tract symptoms 03/31/2014  ? Insomnia 03/03/2014  ? Diverticulosis of large intestine without  hemorrhage 03/03/2014  ? Alcohol abuse, daily use 03/03/2014  ? Essential hypertension 07/08/2011  ? Hiatal hernia with gastroesophageal reflux 07/07/2011  ? Lumbago with sciatica 07/07/2011  ? ? ?Orientation RESPIRATION BLADDER Height & Weight   ?  ?Self, Place ? Normal Continent Weight: 145 lb 11.6 oz (66.1 kg) ?Height:  '5\' 7"'$  (170.2 cm)  ?BEHAVIORAL SYMPTOMS/MOOD NEUROLOGICAL BOWEL NUTRITION STATUS  ?    Incontinent Diet (See dc summary)  ?AMBULATORY STATUS COMMUNICATION OF NEEDS Skin   ?Limited Assist Verbally Normal ?  ?  ?  ?    ?     ?     ? ? ?Personal Care Assistance Level of Assistance  ?Bathing, Feeding, Dressing Bathing Assistance: Limited assistance ?Feeding assistance: Limited assistance ?Dressing Assistance: Limited assistance ?   ? ?Functional Limitations Info  ?Sight, Hearing Sight Info: Impaired ?Hearing Info: Impaired ?   ? ? ?SPECIAL CARE FACTORS FREQUENCY  ?PT (By licensed PT), OT (By licensed OT)   ?  ?PT Frequency: 5x/week ?OT Frequency: 5x/week ?  ?  ?  ?   ? ? ?Contractures Contractures Info: Not present  ? ? ?Additional Factors Info  ?Code Status, Allergies Code Status Info: Full ?Allergies Info: Benazepril, Naltrexone, Nsaids ?  ?  ?  ?   ? ?Current Medications (08/16/2021):  This is the current hospital active medication list ?Current Facility-Administered Medications  ?Medication Dose Route Frequency Provider Last Rate Last Admin  ? acetaminophen (TYLENOL) tablet 650 mg  650 mg Oral Q6H PRN Fuller Plan A, MD      ? Or  ? acetaminophen (TYLENOL) suppository 650 mg  650 mg Rectal Q6H PRN Tamala Julian,  Rondell A, MD      ? albuterol (PROVENTIL) (2.5 MG/3ML) 0.083% nebulizer solution 2.5 mg  2.5 mg Nebulization Q6H PRN Smith, Rondell A, MD      ? diazepam (VALIUM) tablet 5 mg  5 mg Oral BID Kayleen Memos, DO      ? Followed by  ? Derrill Memo ON 08/18/2021] diazepam (VALIUM) tablet 5 mg  5 mg Oral QHS Kayleen Memos, DO      ? Followed by  ? Derrill Memo ON 08/19/2021] diazepam (VALIUM) tablet 2 mg  2 mg Oral  QHS Hall, Carole N, DO      ? enoxaparin (LOVENOX) injection 40 mg  40 mg Subcutaneous Q24H Smith, Rondell A, MD   40 mg at 08/16/21 1514  ? fluticasone (FLONASE) 50 MCG/ACT nasal spray 2 spray  2 spray Each Nare Daily PRN Fuller Plan A, MD      ? folic acid (FOLVITE) tablet 1 mg  1 mg Oral Daily Smith, Rondell A, MD   1 mg at 08/16/21 9450  ? loratadine (CLARITIN) tablet 10 mg  10 mg Oral Daily PRN Fuller Plan A, MD      ? LORazepam (ATIVAN) injection 0-4 mg  0-4 mg Intravenous Q12H Smith, Rondell A, MD      ? LORazepam (ATIVAN) tablet 1-4 mg  1-4 mg Oral Q1H PRN Fuller Plan A, MD   1 mg at 08/13/21 2024  ? Or  ? LORazepam (ATIVAN) injection 1-4 mg  1-4 mg Intravenous Q1H PRN Fuller Plan A, MD   2 mg at 08/14/21 0829  ? meclizine (ANTIVERT) tablet 25 mg  25 mg Oral TID PRN Fuller Plan A, MD      ? metoprolol succinate (TOPROL-XL) 24 hr tablet 100 mg  100 mg Oral Daily Tamala Julian, Rondell A, MD   100 mg at 08/16/21 0804  ? metoprolol tartrate (LOPRESSOR) injection 2.5 mg  2.5 mg Intravenous Q6H PRN Irene Pap N, DO      ? multivitamin with minerals tablet 1 tablet  1 tablet Oral Daily Norval Morton, MD   1 tablet at 08/16/21 0804  ? polyvinyl alcohol (LIQUIFILM TEARS) 1.4 % ophthalmic solution 1-2 drop  1-2 drop Both Eyes Daily PRN Fuller Plan A, MD      ? sacubitril-valsartan (ENTRESTO) 24-26 mg per tablet  1 tablet Oral BID Irene Pap N, DO   1 tablet at 08/16/21 3888  ? sodium chloride flush (NS) 0.9 % injection 3 mL  3 mL Intravenous Q12H Smith, Rondell A, MD   3 mL at 08/16/21 0805  ? spironolactone (ALDACTONE) tablet 25 mg  25 mg Oral Daily Irene Pap N, DO   25 mg at 08/16/21 2800  ? traZODone (DESYREL) tablet 50 mg  50 mg Oral QHS Irene Pap N, DO   50 mg at 08/15/21 2126  ? ? ? ?Discharge Medications: ?Please see discharge summary for a list of discharge medications. ? ?Relevant Imaging Results: ? ?Relevant Lab Results: ? ? ?Additional Information ?SSN: [id number]. 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  ?[place] COVID-19 Vaccine 06/12/2020 , 01/27/2020  ? ? ?Lissa Morales Floetta Brickey, LCSW ? ? ? ? ?

## 2021-08-16 NOTE — Progress Notes (Signed)
? ?PROGRESS NOTE ? ?Luis Porter ZOX:[id number] DOB: [birth year] DOA: 08/13/2021 ?PCP: Olin Hauser, DO ? ?HPI/Recap of past 24 hours: ?Luis Porter is a 61 y.o. male with medical history significant of hypertension, dilated cardiomyopathy, chronic combined systolic and diastolic CHF, alcohol abuse, and GERD who presents from home due to altered mental status and generalized weakness.  Work-up revealed alcohol intoxication and concern for alcohol withdrawal.  Patient was severely jittery on admission.  CT head unremarkable for any intracranial abnormalities.  Patient was placed on CIWA protocol as well as Valium taper. ? ?08/16/2021: Patient was seen and examined at bedside.  There were no acute events overnight.  His jitteriness is improving on Valium. ? ?Assessment/Plan: ?Principal Problem: ?  Alcohol abuse with intoxication, with delirium ([place]) ?Active Problems: ?  Essential hypertension ?  GERD (gastroesophageal reflux disease) ?  Elevated troponin ?  Syncope ?  Chronic combined systolic and diastolic heart failure ([place]) ?  Acute metabolic encephalopathy ?  Transaminitis ?  Macrocytosis ?  Frequent falls ?  Prolonged QT interval ? ?Alcohol abuse with intoxication and concern for alcohol withdrawal ?Alcohol level 366 on admission. ?Severe jitteriness with concern for alcohol withdrawal ?Continue Valium with taper. ?Continue CIWA protocol ?Continue multivitamins, high-dose IV thiamine, folic acid supplement. ?Continue fall precautions, aspiration precautions. ?TOC consulted to provide resources for alcohol cessation and abstinence. ?Patient and wife are interested in rehabilitation for alcohol abstinence. ?  ?Frequent falls ?Patient reports that he feels dizzy and off-balance whenever standing up or moving around. ?Obtain orthostatic vital signs if stable on his feet.  High fall risk. ?Continue PT OT with assistance and fall precautions. ?  ?Elevated troponin, flat ?Acute.   High-sensitivity troponins 25-> 25.   ?EKG without significant ischemic changes. ?Patient denies any anginal symptoms. ?  ?Elevated liver chemistries, likely secondary to alcohol abuse ?Chronic.  AST 149 and ALT 61.   Hepatitis panels as of 06/2020 are negative. ?AST to ALT ratio consistent with alcohol abuse.  ?Continue to trend LFTs.  Downtrending. ?Continue to avoid hepatotoxic agents. ? ?Resolved post repletion: Refractory Hypokalemia ?Serum potassium 3.4> 3.8. ?Repleted orally, serum magnesium 1.8. ?  ?Chronic combined systolic and diastolic heart failure ([place]) ?Last echocardiogram revealed EF of 35% with global hypokinesis in 03/2021.   ?Patient appears to be euvolemic at this time. ?Continue monitor I&Os and daily weight ?Continue Toprol-XL, Entresto, spironolactone. ?  ?Essential hypertension ?-Continue home oral antihypertensives. ?Continue to monitor vital signs ? ?Prolonged QT interval ?Acute.  QTc 547. ?-Continue to avoid QT prolonging medications ?Continue to optimize magnesium and potassium levels. ?  ?Macrocytosis ?Acute on chronic. Patient noted to have MCV 100.5 and MCH 36.  Thought secondary to patient's chronic alcohol abuse. ?  ?GERD ?-[place] substitution of Protonix ? ? ?  ?  ? Advance Care Planning:   Code Status: Full Code  ?  ?Consults: none ?  ?Family Communication: Updated partner via phone on 08/15/2021. ?  ? ? ?Disposition Plan: Likely will discharge to SNF prior to returning home or alcohol rehab facility. ? ? ?Consultants: ?None. ? ?Procedures: ?None. ? ?Antimicrobials: ?None. ? ?DVT prophylaxis: ?Subcu enoxaparin. ? ?Status is: Inpatient ?Patient requires at least 2 midnights for further evaluation and treatment of present condition ? ? ? ?Objective: ?Vitals:  ? 08/16/21 0743 08/16/21 0804 08/16/21 1143 08/16/21 1200  ?BP: 134/87 128/86 102/80 118/89  ?Pulse: (!) 101 95 91 97  ?Resp: 16  17   ?Temp: 98.4 ?F (36.9 ?C)  97.9 ?F (  36.6 ?C)   ?TempSrc: Oral  Oral   ?SpO2: 92%  97%   ?Weight:       ?Height:      ? ? ?Intake/Output Summary (Last 24 hours) at 08/16/2021 1250 ?Last data filed at 08/16/2021 0500 ?Gross per 24 hour  ?Intake 204.07 ml  ?Output 182 ml  ?Net 22.07 ml  ? ?Filed Weights  ? 08/14/21 0500 08/15/21 0500 08/16/21 0500  ?Weight: 66.3 kg 66.2 kg 66.1 kg  ? ? ?Exam: ? ?General: 61 y.o. year-old male frail-appearing in no acute distress.  He is alert and oriented x3. ?Cardiovascular: Regular rate and rhythm no rubs or gallops.  ?Respiratory: Clear to auscultation no wheezes or rales. ?Abdomen: Soft noted normal bowel sounds present. ?Musculoskeletal: Lower extremity edema bilaterally. ?Skin: No ulcerative lesions noted. ?Psychiatry: Mood is appropriate for condition and setting. ?Neuro: Awake and alert.  Jitteriness is improved. ? ?Data Reviewed: ?CBC: ?Recent Labs  ?Lab 08/13/21 ?1150 08/13/21 ?1202 08/14/21 ?0018 08/16/21 ?0216  ?WBC 5.5  --  3.8* 6.9  ?NEUTROABS 2.8  --   --   --   ?HGB 15.3 16.0 13.2 16.6  ?HCT 42.7 47.0 37.1* 48.2  ?MCV 100.5*  --  100.8* 103.4*  ?PLT 157  --  147* 149*  ? ?Basic Metabolic Panel: ?Recent Labs  ?Lab 08/13/21 ?1150 08/13/21 ?1202 08/13/21 ?1930 08/14/21 ?0018 08/16/21 ?0216  ?NA 140 139  --  137 133*  ?K 3.7 3.8  --  3.4* 3.8  ?CL 100  --   --  103 103  ?CO2 23  --   --  24 20*  ?GLUCOSE 76  --   --  107* 97  ?BUN <5*  --   --  5* 8  ?CREATININE 0.63  --   --  0.56* 0.67  ?CALCIUM 8.7*  --   --  8.8* 9.4  ?MG  --   --  1.8  --   --   ?PHOS  --   --  3.5  --   --   ? ?GFR: ?Estimated Creatinine Clearance: 93 mL/min (by C-G formula based on SCr of 0.67 mg/dL). ?Liver Function Tests: ?Recent Labs  ?Lab 08/13/21 ?1150 08/14/21 ?0018 08/16/21 ?0216  ?AST 149* 127* 84*  ?ALT 61* 52* 50*  ?ALKPHOS 59 55 68  ?BILITOT 0.8 0.9 1.5*  ?PROT 6.9 6.2* 6.6  ?ALBUMIN 3.8 3.2* 3.3*  ? ?No results for input(s): LIPASE, AMYLASE in the last 168 hours. ?Recent Labs  ?Lab 08/13/21 ?1150  ?AMMONIA 32  ? ?Coagulation Profile: ?No results for input(s): INR, PROTIME in the last  168 hours. ?Cardiac Enzymes: ?No results for input(s): CKTOTAL, CKMB, CKMBINDEX, TROPONINI in the last 168 hours. ?BNP (last 3 results) ?No results for input(s): PROBNP in the last 8760 hours. ?HbA1C: ?No results for input(s): HGBA1C in the last 72 hours. ?CBG: ?No results for input(s): GLUCAP in the last 168 hours. ?Lipid Profile: ?No results for input(s): CHOL, HDL, LDLCALC, TRIG, CHOLHDL, LDLDIRECT in the last 72 hours. ?Thyroid Function Tests: ?No results for input(s): TSH, T4TOTAL, FREET4, T3FREE, THYROIDAB in the last 72 hours. ?Anemia Panel: ?Recent Labs  ?  08/14/21 ?0018  ?VITAMINB12 1,002*  ? ?Urine analysis: ?   ?Component Value Date/Time  ? COLORURINE YELLOW 08/13/2021 1050  ? APPEARANCEUR CLEAR 08/13/2021 1050  ? LABSPEC 1.005 08/13/2021 1050  ? PHURINE 7.0 08/13/2021 1050  ? GLUCOSEU NEGATIVE 08/13/2021 1050  ? HGBUR SMALL (A) 08/13/2021 1050  ? [place] NEGATIVE 08/13/2021 1050  ?  KETONESUR 20 (A) 08/13/2021 1050  ? PROTEINUR NEGATIVE 08/13/2021 1050  ? NITRITE NEGATIVE 08/13/2021 1050  ? LEUKOCYTESUR NEGATIVE 08/13/2021 1050  ? ?Sepsis Labs: ?'@LABRCNTIP'$ (procalcitonin:4,lacticidven:4) ? ?) ?Recent Results (from the past 240 hour(s))  ?Resp Panel by RT-PCR (Flu A&B, Covid) Nasopharyngeal Swab     Status: None  ? Collection Time: 08/13/21  1:58 PM  ? Specimen: Nasopharyngeal Swab; Nasopharyngeal(NP) swabs in vial transport medium  ?Result Value Ref Range Status  ? SARS Coronavirus 2 by RT PCR NEGATIVE NEGATIVE Final  ?  Comment: (NOTE) ?SARS-CoV-2 target nucleic acids are NOT DETECTED. ? ?The SARS-CoV-2 RNA is generally detectable in upper respiratory ?specimens during the acute phase of infection. The lowest ?concentration of SARS-CoV-2 viral copies this assay can detect is ?138 copies/mL. A negative result does not preclude SARS-Cov-2 ?infection and should not be used as the sole basis for treatment or ?other patient management decisions. A negative result may occur with  ?improper specimen  collection/handling, submission of specimen other ?than nasopharyngeal swab, presence of viral mutation(s) within the ?areas targeted by this assay, and inadequate number of viral ?copies(<138 copies/mL). A negative result

## 2021-08-17 DIAGNOSIS — I5042 Chronic combined systolic (congestive) and diastolic (congestive) heart failure: Secondary | ICD-10-CM | POA: Diagnosis not present

## 2021-08-17 DIAGNOSIS — G9341 Metabolic encephalopathy: Secondary | ICD-10-CM

## 2021-08-17 DIAGNOSIS — F10121 Alcohol abuse with intoxication delirium: Secondary | ICD-10-CM | POA: Diagnosis not present

## 2021-08-17 DIAGNOSIS — I10 Essential (primary) hypertension: Secondary | ICD-10-CM | POA: Diagnosis not present

## 2021-08-17 LAB — COMPREHENSIVE METABOLIC PANEL
ALT: 41 U/L (ref 0–44)
AST: 51 U/L — ABNORMAL HIGH (ref 15–41)
Albumin: 3 g/dL — ABNORMAL LOW (ref 3.5–5.0)
Alkaline Phosphatase: 53 U/L (ref 38–126)
Anion gap: 7 (ref 5–15)
BUN: 14 mg/dL (ref 6–20)
CO2: 23 mmol/L (ref 22–32)
Calcium: 9.7 mg/dL (ref 8.9–10.3)
Chloride: 103 mmol/L (ref 98–111)
Creatinine, Ser: 0.76 mg/dL (ref 0.61–1.24)
GFR, Estimated: >60 mL/min (ref >60)
Glucose, Bld: 99 mg/dL (ref 70–99)
Potassium: 3.7 mmol/L (ref 3.5–5.1)
Sodium: 133 mmol/L — ABNORMAL LOW (ref 135–145)
Total Bilirubin: 0.9 mg/dL (ref 0.3–1.2)
Total Protein: 6 g/dL — ABNORMAL LOW (ref 6.5–8.1)

## 2021-08-17 LAB — CBC
HCT: 41.6 % (ref 39.0–52.0)
Hemoglobin: 14.5 g/dL (ref 13.0–17.0)
MCH: 36.3 pg — ABNORMAL HIGH (ref 26.0–34.0)
MCHC: 34.9 g/dL (ref 30.0–36.0)
MCV: 104 fL — ABNORMAL HIGH (ref 80.0–100.0)
Platelets: 152 10*3/uL (ref 150–400)
RBC: 4 MIL/uL — ABNORMAL LOW (ref 4.22–5.81)
RDW: 13.3 % (ref 11.5–15.5)
WBC: 8.6 10*3/uL (ref 4.0–10.5)
nRBC: 0 % (ref 0.0–0.2)

## 2021-08-17 LAB — VITAMIN B1: Vitamin B1 (Thiamine): 338.5 nmol/L — ABNORMAL HIGH (ref 66.5–200.0)

## 2021-08-17 MED ORDER — PANTOPRAZOLE SODIUM 40 MG PO TBEC
40.0000 mg | DELAYED_RELEASE_TABLET | Freq: Two times a day (BID) | ORAL | Status: DC
  Administered 2021-08-17 – 2021-08-22 (×11): 40 mg via ORAL

## 2021-08-17 MED ORDER — THIAMINE HCL 100 MG PO TABS
100.0000 mg | ORAL_TABLET | Freq: Every day | ORAL | Status: DC
  Administered 2021-08-17 – 2021-08-22 (×6): 100 mg via ORAL

## 2021-08-17 MED ORDER — DIPHENHYDRAMINE HCL 25 MG PO CAPS
25.0000 mg | ORAL_CAPSULE | Freq: Once | ORAL | Status: AC | PRN
  Administered 2021-08-17: 25 mg via ORAL

## 2021-08-17 NOTE — Assessment & Plan Note (Signed)
Due to intoxication ?

## 2021-08-17 NOTE — Assessment & Plan Note (Addendum)
See above

## 2021-08-17 NOTE — TOC Initial Note (Signed)
Transition of Care (TOC) - Initial/Assessment Note  ? ? ?Patient Details  ?Name: Atthew Coutant Stenseth ?MRN: [id number] ?Date of Birth: 06/17/1960 ? ?Transition of Care (TOC) CM/SW Contact:    ?Carles Collet, RN ?Phone Number: ?08/17/2021, 1:35 PM ? ?Clinical Narrative:              ?Discussed patient in rounds. ETOH wdraw vs Wernicke's Encephalopathy.  ? ?Anticipate transition to home, patient will need supervision, LVM with spouse requesting her to  callback.  ? ?[facility] assessing for potential HH PT, awaiting answer from branch ? ? ?Expected Discharge Plan: [place] ?Barriers to Discharge: Continued Medical Work up ? ? ?Patient Goals and CMS Choice ?  ?  ?  ? ?Expected Discharge Plan and Services ?Expected Discharge Plan: [place] ?  ?  ?  ?  ?                ?  ?  ?  ?  ?  ?  ?  ?  ?  ?  ? ?Prior Living Arrangements/Services ?  ?  ?  ?       ?  ?  ?  ?  ? ?Activities of Daily Living ?  ?  ? ?Permission Sought/Granted ?  ?  ?   ?   ?   ?   ? ?Emotional Assessment ?  ?  ?  ?  ?  ?  ? ?Admission diagnosis:  Altered mental status, unspecified altered mental status type [R41.82] ?Acute metabolic encephalopathy [X21.19] ?Patient Active Problem List  ? Diagnosis Date Noted  ? Acute metabolic encephalopathy 41/74/0814  ? Transaminitis 08/13/2021  ? Macrocytosis 08/13/2021  ? Frequent falls 08/13/2021  ? Alcohol abuse with intoxication, with delirium ([place]) 08/13/2021  ? Prolonged QT interval 08/13/2021  ? Alcohol withdrawal delirium ([place]) 02/10/2021  ? Postural dizziness with presyncope 01/31/2021  ? Hypernatremia 01/31/2021  ? Chronic combined systolic and diastolic heart failure ([place]) 01/31/2021  ? Syncope   ? Dilated cardiomyopathy ([place])   ? Elevated troponin 01/16/2021  ? Alcohol induced fatty liver 08/04/2020  ? Chronic alcoholic gastritis without hemorrhage 08/04/2020  ? Alcohol dependence in early full remission ([place]) 08/04/2020  ? Angioedema 07/28/2020  ? Alcohol withdrawal  syndrome with complication ([place])   ? Abdominal pain, epigastric   ? GERD (gastroesophageal reflux disease) 09/19/2019  ? Acute diverticulitis 09/19/2019  ? Alcohol abuse 09/19/2019  ? Essential tremor 07/04/2019  ? Hx of adenomatous colonic polyps 01/06/2017  ? Herniated lumbar intervertebral disc 05/18/2016  ? Mixed hyperlipidemia 05/06/2014  ? BPH with obstruction/lower urinary tract symptoms 03/31/2014  ? Insomnia 03/03/2014  ? Diverticulosis of large intestine without hemorrhage 03/03/2014  ? Alcohol abuse, daily use 03/03/2014  ? Essential hypertension 07/08/2011  ? Hiatal hernia with gastroesophageal reflux 07/07/2011  ? Lumbago with sciatica 07/07/2011  ? ?PCP:  Olin Hauser, DO ?Pharmacy:   ?[id number] [address] - [id number]?Phone: [phone]: [phone]? ? ? ? ?Social Determinants of Health (SDOH) Interventions ?  ? ?Readmission Risk Interventions ?No flowsheet data found. ? ? ?

## 2021-08-17 NOTE — Hospital Course (Signed)
Luis Porter is a 61 y.o. M with HTN, sCHF and alcohol dependence who presented with altered mental status and generalized weakness.   ? ?In the ER, CT head normal.  Noted to be intoxicated.  Admitted for withdrawal and confusion. ?

## 2021-08-17 NOTE — Progress Notes (Signed)
?  Progress Note ? ? ?Patient: Luis Porter Walt YTK:[id number] DOB: [birth year] DOA: 08/13/2021     4 ?DOS: the patient was seen and examined on 08/17/2021 ?  ? ? ? ? ?Brief hospital course: ?Mr. Grauberger is a 61 y.o. M with HTN, sCHF and alcohol dependence who presented with altered mental status and generalized weakness.   ? ?In the ER, CT head normal.  Noted to be intoxicated.  Admitted for withdrawal and confusion. ? ? ? ? ? ? ?Assessment and Plan: ?* Alcohol abuse with intoxication, with delirium ([place]) ?Patient confused at presentation.  Wernicke's was suspected over withdrawal delirium, and so high dose thiamine started. ? ?CT head normal, ammonia near normal.    Completed 3 days high dose IV thiamine ?-Continue thiamine and folate ?- PT eval ?- Continue Valium taper ?  ? ?Frequent falls ?- Check orthostatic vital signs ? ?Macrocytosis ?Acute on chronic. Patient noted to have MCV 100.5 and MCH 36.  Thought secondary to patient's alcohol abuse. ? ? ?Transaminitis ?Alcoholic hepatitis. ? ? ?Acute metabolic encephalopathy ?Suspected Wernicke's. ? ?Chronic combined systolic and diastolic heart failure ([place]) ?Last echocardiogram revealed EF of 35% with global hypokinesis in 03/2021.   ?Euvolemic here. ?-Continue metoprolol, Entresto, spironolactone ? ?Syncope ?Due to intoxication ? ?Myocardial injury ?No signs or symptmos of ischemia. ? ?GERD (gastroesophageal reflux disease) ?- Continue pharmacy substitution of Protonix ? ?Essential hypertension ?Blood pressures currently 124/90-157/87.  Home medication regimen includes Entresto 24-26 mg twice daily, metoprolol 100 mg daily, and spironolactone 12.5 mg daily. ?-Continue Entresto and metoprolol ?-Initially held spironolactone due to reports of dizziness and/or syncope ? ? ? ? ?  ? ?Subjective: Patient still feels very unsteady, tremors present, but improving.  Mild, occasional headache.  No dyspnea, chest pain ? ?Physical Exam: ?Vitals:  ? 08/17/21 0500 08/17/21 0600  08/17/21 0721 08/17/21 1126  ?BP: 114/74 95/73 114/81 104/78  ?Pulse: 84 85 84 100  ?Resp: '15 13 13 14  '$ ?Temp:   97.9 ?F (36.6 ?C) 98.2 ?F (36.8 ?C)  ?TempSrc:   Oral Oral  ?SpO2:   96% 95%  ?Weight:  65.5 kg    ?Height:  '5\' 7"'$  (1.702 m)    ? ?Elderly adult male, appears somewhat disheveled, lying in bed, interactive ?Heart RRR, no murmurs, no lower extremity edema, JVP normal ?Normal respiratory rate and rhythm, lungs clear without rales or wheezes ?Abdomen soft no tenderness palpation or guarding, no ascites or distention ?Tension somewhat distracted, affect normal, judgment and insight appear slightly impaired ?Extraocular movements normal, no significant resting tremor, minimal intention tremor, strength appears to generally weak but symmetric ? ? ? ? ?Data Reviewed: ?Nursing notes reviewed, vital signs reviewed ?Basic metabolic panel notable for sodium 133, creatinine normal, LFTs improved from previous, B12 normal, macrocytosis, normal platelets and white blood cell count ? ?Family Communication: Wife by phone ? ?Disposition: ?Status is: Inpatient ?Remains inpatient appropriate because: He continues to have resolving withdrawal symptoms, he is very unsteady, still actively withdrawing. ? ?Possibly home tomorrow ? ? ? Planned Discharge Destination: Home with Home Health ? ? ? ?  ? ?Author: ?Edwin Dada, MD ?08/17/2021 2:32 PM ? ?For on call review [email or web address].  ?

## 2021-08-17 NOTE — Progress Notes (Signed)
Physical Therapy Treatment ?Patient Details ?Name: Luis Porter ?MRN: [id number] ?DOB: [birth year] ?Today's Date: 08/17/2021 ? ? ?History of Present Illness 61 y.o. male  presents 08/13/21 due to altered mental status. +ETOH abuse (18-24 beers/day). Recent fall with laceration to back of head.  PMH significant of hypertension, dilated cardiomyopathy, chronic combined systolic and diastolic CHF, and GERD ? ?  ?PT Comments  ? ? Despite continued improvement in mobility, pt with imbalance with walking and requires up to min assist to maintain balance. Will benefit from use of RW, however currently does not want to use one. May progress to point he does not need it, however would favor teaching him how to use it safely prior to discharge.  ?   ?Recommendations for follow up therapy are one component of a multi-disciplinary discharge planning process, led by the attending physician.  Recommendations may be updated based on patient status, additional functional criteria and insurance authorization. ? ?Follow Up Recommendations ? Home health PT (if family/wife can provide supervision/minguard assist) ?  ?  ?Assistance Recommended at Discharge Frequent or constant Supervision/Assistance  ?Patient can return home with the following Assistance with cooking/housework;Assistance with feeding;Direct supervision/assist for medications management;Direct supervision/assist for financial management;A little help with walking and/or transfers;Help with stairs or ramp for entrance ?  ?Equipment Recommendations ? Rolling walker (2 wheels) (pt will likely refuse)  ?  ?Recommendations for Other Services   ? ? ?  ?Precautions / Restrictions Precautions ?Precautions: Fall ?Restrictions ?Weight Bearing Restrictions: No  ?  ? ?Mobility ? Bed Mobility ?Overal bed mobility: Needs Assistance ?Bed Mobility: Supine to Sit ?  ?  ?Supine to sit: Supervision ?Sit to supine: Supervision ?  ?General bed mobility comments: supervision for safety  due to h/o impulsivity (supine to sit) and for safety due to very close to edge of bed as lying down ?  ? ?Transfers ?Overall transfer level: Needs assistance ?Equipment used: None ?Transfers: Sit to/from Stand ?Sit to Stand: Min assist ?  ?  ?  ?  ?  ?General transfer comment: slight stagger step as coming to stand ?  ? ?Ambulation/Gait ?Ambulation/Gait assistance: Min assist ?Gait Distance (Feet): 500 Feet ?Assistive device: None ?Gait Pattern/deviations: Step-through pattern, Decreased stride length, Wide base of support, Drifts right/left, Staggering left (bil feet turn inward from birth per pt) ?  ?  ?  ?General Gait Details: drifts and staggers to his left more than to right (especially when pt voluntarily turning his head to look around at his surroundings) ? ? ?Stairs ?  ?  ?  ?  ?  ? ? ?Wheelchair Mobility ?  ? ?Modified Rankin (Stroke Patients Only) ?  ? ? ?  ?Balance Overall balance assessment: Needs assistance ?Sitting-balance support: Feet supported, No upper extremity supported ?Sitting balance-Leahy Scale: Good ?  ?  ?Standing balance support: No upper extremity supported ?Standing balance-Leahy Scale: Poor ?Standing balance comment: slight staggering in static stance ?  ?  ?  ?  ?  ?  ?  ?  ?  ?  ?  ?  ? ?  ?Cognition Arousal/Alertness: Awake/alert ?Behavior During Therapy: [facility] for tasks assessed/performed, Impulsive ?Overall Cognitive Status: No family/caregiver present to determine baseline cognitive functioning ?Area of Impairment: Awareness, Safety/judgement, Problem solving ?  ?  ?  ?  ?  ?  ?  ?  ?Orientation Level:  (NT) ?  ?  ?  ?Safety/Judgement: Decreased awareness of safety, Decreased awareness of deficits ?Awareness: Intellectual ?Problem Solving: Slow processing, Difficulty  sequencing, Requires verbal cues ?General Comments: less impulsive; continues with decr awareness of deficits (denying need for cane or RW despite numerous episodes of imbalance requiring min assist to maintain  balance) ?  ?  ? ?  ?Exercises Other Exercises ?Other Exercises: Pt demonstrates that he has been doing SLR, heelslides, and ankle pumps on his own ? ?  ?General Comments General comments (skin integrity, edema, etc.): HR 110s with sats >95% on RA ?  ?  ? ?Pertinent Vitals/Pain Pain Assessment ?Pain Assessment: No/denies pain  ? ? ?Home Living   ?  ?  ?  ?  ?  ?  ?  ?  ?  ?   ?  ?Prior Function    ?  ?  ?   ? ?PT Goals (current goals can now be found in the care plan section) Acute Rehab PT Goals ?Patient Stated Goal: wants to return home wtih wife ?Time For Goal Achievement: 08/28/21 ?Potential to Achieve Goals: Fair ?Progress towards PT goals: Progressing toward goals ? ?  ?Frequency ? ? ? Min 3X/week ? ? ? ?  ?PT Plan Current plan remains appropriate  ? ? ?Co-evaluation   ?  ?  ?  ?  ? ?  ?AM-PAC PT "6 Clicks" Mobility   ?Outcome Measure ? Help needed turning from your back to your side while in a flat bed without using bedrails?: None ?Help needed moving from lying on your back to sitting on the side of a flat bed without using bedrails?: None ?Help needed moving to and from a bed to a chair (including a wheelchair)?: A Little ?Help needed standing up from a chair using your arms (e.g., wheelchair or bedside chair)?: A Little ?Help needed to walk in hospital room?: A Little ?Help needed climbing 3-5 steps with a railing? : A Little ?6 Click Score: 20 ? ?  ?End of Session Equipment Utilized During Treatment: Gait belt ?Activity Tolerance: Patient tolerated treatment well ?Patient left: with call bell/phone within reach;in bed;with bed alarm set (pt refused sitting up in recliner due to discomfort yesterday when sitting up) ?Nurse Communication: Mobility status ?PT Visit Diagnosis: Difficulty in walking, not elsewhere classified (R26.2) ?  ? ? ?Time: 8185-6314 ?PT Time Calculation (min) (ACUTE ONLY): 17 min ? ?Charges:  $Gait Training: 8-22 mins          ?          ? ? ?Arby Barrette, PT ?Acute Rehabilitation Services   ?Pager [phone] ?Office [phone] ? ? ? ?Luis Porter ?08/17/2021, 10:58 AM ? ?

## 2021-08-18 DIAGNOSIS — F10121 Alcohol abuse with intoxication delirium: Secondary | ICD-10-CM | POA: Diagnosis not present

## 2021-08-18 DIAGNOSIS — R270 Ataxia, unspecified: Secondary | ICD-10-CM

## 2021-08-18 DIAGNOSIS — I5042 Chronic combined systolic (congestive) and diastolic (congestive) heart failure: Secondary | ICD-10-CM | POA: Diagnosis not present

## 2021-08-18 DIAGNOSIS — I10 Essential (primary) hypertension: Secondary | ICD-10-CM | POA: Diagnosis not present

## 2021-08-18 DIAGNOSIS — G9341 Metabolic encephalopathy: Secondary | ICD-10-CM | POA: Diagnosis not present

## 2021-08-18 MED ORDER — HALOPERIDOL 1 MG PO TABS
2.0000 mg | ORAL_TABLET | Freq: Once | ORAL | Status: AC
  Administered 2021-08-18: 2 mg via ORAL
  Filled 2021-08-18: qty 2

## 2021-08-18 NOTE — Progress Notes (Signed)
?  Progress Note ? ? ?Patient: Luis Porter HFW:[id number] DOB: December 16, 1960 DOA: 08/13/2021     5 ?DOS: the patient was seen and examined on 08/18/2021 ?  ? ? ? ? ?Brief hospital course: ?Mr. Cumpian is a 61 y.o. M with HTN, sCHF and alcohol dependence who presented with altered mental status and generalized weakness.   ? ?In the ER, CT head normal.  Noted to be intoxicated.  Admitted for withdrawal and confusion. ? ? ? ? ? ? ?Assessment and Plan: ?* Alcohol abuse with intoxication, with delirium ([place]) ?Patient confused at presentation.  Wernicke's was suspected over withdrawal delirium, and so high dose thiamine started. ? ?CT head normal, ammonia near normal.    Completed 3 days high dose IV thiamine.  B1 level actually high.  ?if this was after treatment started. ? ?-Continue thiamine and folate ?- PT eval ?- Continue Valium taper ?  ? ? ? ? ?Ataxia ?As above, Wernicke's was treated empirically.  I suspect this may just be cerebellar injury from alcohol.  Hopefully it will gradually resolve with abstinence.  At present he is not safe to stand or walk alone. ? ?Macrocytosis ?Due to alcohol ? ? ?Transaminitis ?Alcoholic hepatitis. ? ? ?Acute metabolic encephalopathy ?Presented with confsuion and disorientation.  He is more oriented now, but has some mild cognitive impairment, maybe early alcoholic dementia ? ?Chronic combined systolic and diastolic heart failure ([place]) ?Last echocardiogram revealed EF of 35% with global hypokinesis in 03/2021.   ?Euvolemic here. ?-Continue metoprolol, Entresto, spironolactone ? ?Syncope ?Due to intoxication ? ?Myocardial injury ?No signs or symptmos of ischemia. ? ?GERD (gastroesophageal reflux disease) ?-Continue PPI ? ?Essential hypertension ?BP stable ?- Continue Entresto and metoprolol and spironolactone  ? ? ? ? ?  ? ?Subjective: Patient still somewhat scatterbrained, very unsteady and ataxic.  Occasional headaches.  No fever, chest pain, dyspnea, cough, abdominal  pain. ? ?Physical Exam: ?Vitals:  ? 08/18/21 0717 08/18/21 0800 08/18/21 1143 08/18/21 1146  ?BP: 107/74 105/73 99/74   ?Pulse: 89 83 83   ?Resp: (!) 21 18 (!) 21 18  ?Temp: 98 ?F (36.7 ?C)  98.1 ?F (36.7 ?C)   ?TempSrc: Oral  Oral   ?SpO2: 97% 96% 96%   ?Weight:      ?Height:      ? ?Elderly adult male, appears somewhat disheveled, lying in bed, interactive ?With standing, the patient loses his balance with just walking a few feet. ?Heart RRR, no murmurs, no lower extremity edema, JVP normal ?Normal respiratory rate and rhythm, lungs clear without rales or wheezes ?Abdomen soft no tenderness palpation or guarding, no ascites or distention ?Attention distracted, judgment insight appear impaired.  He has an intention tremor. ? ? ? ? ?Data Reviewed: ?Nursing notes reviewed, vital signs reviewed ?No new labs to review ? ?Family Communication:   ? ?Disposition: ?Status is: Inpatient ?Remains inpatient appropriate because:  ?The patient is medically ready for discharge, at present he has no safe disposition, TOC are working on this ? ? ? Planned Discharge Destination: Home with Home Health ? ? ? ?  ? ?Author: ?Edwin Dada, MD ?08/18/2021 1:54 PM ? ?For on call review [email or web address].  ?

## 2021-08-18 NOTE — Progress Notes (Addendum)
Pt had a assisted fall today around 1pm. Bed alarm was on and RN went to the pt's room and saw the pt getting out of bed. RN was able to caught majority of the pt's weight, however pt still hit the pt's wall. He had a small abrasion on the L shoulder. MD and charge nurse was notified. Pt did not complain of any pain, was confused and agitated. Will monitor pt. ?

## 2021-08-18 NOTE — Assessment & Plan Note (Addendum)
See above.  Remains unsafe to be alone. ?

## 2021-08-18 NOTE — TOC Progression Note (Signed)
Transition of Care (TOC) - Progression Note  ? ? ?Patient Details  ?Name: Luis Porter ?MRN: [id number] ?Date of Birth: August 25, 1960 ? ?Transition of Care (TOC) CM/SW Contact  ?Benard Halsted, LCSW ?Phone Number: ?08/18/2021, 11:16 AM ? ?Clinical Narrative:    ?Per RNCM patient's significant other not available to supervise patient at home so patient is interested in SA placement. CSW faxed referral to [facility] treatment [facility].  ? ? ?Expected Discharge Plan: [place] ?Barriers to Discharge: Continued Medical Work up ? ?Expected Discharge Plan and Services ?Expected Discharge Plan: [place] ?  ?  ?  ?  ?                ?  ?  ?  ?  ?  ?  ?  ?  ?  ?  ? ? ?Social Determinants of Health (SDOH) Interventions ?  ? ?Readmission Risk Interventions ?   ? View : No data to display.  ?  ?  ?  ? ? ?

## 2021-08-18 NOTE — Progress Notes (Signed)
Occupational Therapy Treatment ?Patient Details ?Name: Luis Porter ?MRN: [id number] ?DOB: 09/02/1960 ?Today's Date: 08/18/2021 ? ? ?History of present illness 61 y.o. male  presents 08/13/21 due to altered mental status. +ETOH abuse (18-24 beers/day). Recent fall with laceration to back of head.  PMH significant of hypertension, dilated cardiomyopathy, chronic combined systolic and diastolic CHF, and GERD ?  ?OT comments ? Patient received in supine and agreeable to OT session.  Patient was able to get to EOB with supervision and donn sandals. Patient was min assist to stedy to walk to sink for self care and was min guard assist for safety during standing tasks.  Patient making good progress with OT requiring less assistance with self care but continues to demonstrate unsafe balance during mobility. Acute OT to continue to follow with patient expecting to discharge home with [facility] with wife assisting.   ? ?Recommendations for follow up therapy are one component of a multi-disciplinary discharge planning process, led by the attending physician.  Recommendations may be updated based on patient status, additional functional criteria and insurance authorization. ?   ?Follow Up Recommendations ? Home health OT  ?  ?Assistance Recommended at Discharge Frequent or constant Supervision/Assistance  ?Patient can return home with the following ? Direct supervision/assist for medications management;Direct supervision/assist for financial management;A little help with walking and/or transfers;A lot of help with bathing/dressing/bathroom ?  ?Equipment Recommendations ? Other (comment) (TBD)  ?  ?Recommendations for Other Services   ? ?  ?Precautions / Restrictions Precautions ?Precautions: Fall ?Restrictions ?Weight Bearing Restrictions: No  ? ? ?  ? ?Mobility Bed Mobility ?Overal bed mobility: Needs Assistance ?Bed Mobility: Supine to Sit, Sit to Supine ?  ?  ?Supine to sit: Supervision ?Sit to supine: Supervision ?   ?General bed mobility comments: able to get to EOB with cues ?  ? ?Transfers ?Overall transfer level: Needs assistance ?Equipment used: None ?Transfers: Sit to/from Stand ?Sit to Stand: Min assist ?  ?  ?  ?  ?  ?General transfer comment: min assist to steady ?  ?  ?Balance Overall balance assessment: Needs assistance ?Sitting-balance support: Feet supported, No upper extremity supported ?Sitting balance-Leahy Scale: Good ?  ?  ?Standing balance support: No upper extremity supported ?Standing balance-Leahy Scale: Poor ?Standing balance comment: slight staggering standing at sink and used sink to stedy ?  ?  ?  ?  ?  ?  ?  ?  ?  ?  ?  ?   ? ?ADL either performed or assessed with clinical judgement  ? ?ADL Overall ADL's : Needs assistance/impaired ?  ?  ?Grooming: Wash/dry hands;Wash/dry face;Oral care;Applying deodorant;Brushing hair;Standing;Sitting;Min guard ?Grooming Details (indicate cue type and reason): min guard due to unsteady balance when standing ?Upper Body Bathing: Min guard;Standing ?Upper Body Bathing Details (indicate cue type and reason): performed standing at sink ?Lower Body Bathing: Min guard;Sitting/lateral leans ?Lower Body Bathing Details (indicate cue type and reason): bathed peri area while standing ?Upper Body Dressing : Minimal assistance;Sitting ?Upper Body Dressing Details (indicate cue type and reason): changed gown ?Lower Body Dressing: Supervision/safety;Sitting/lateral leans ?Lower Body Dressing Details (indicate cue type and reason): donned sandles ?  ?  ?  ?  ?  ?  ?Functional mobility during ADLs: Minimal assistance ?General ADL Comments: min assist to ambulate to sink from EOB without an assistive device with unsteady balance.  Min guard for standing ADLs ?  ? ?Extremity/Trunk Assessment   ?  ?  ?  ?  ?  ? ?  Vision   ?  ?  ?Perception   ?  ?Praxis   ?  ? ?Cognition Arousal/Alertness: Awake/alert ?Behavior During Therapy: [facility] for tasks assessed/performed, Impulsive ?Overall  Cognitive Status: No family/caregiver present to determine baseline cognitive functioning ?Area of Impairment: Awareness, Safety/judgement, Problem solving ?  ?  ?  ?  ?  ?  ?  ?  ?  ?Current Attention Level: Sustained ?  ?Following Commands: Follows one step commands inconsistently ?Safety/Judgement: Decreased awareness of safety, Decreased awareness of deficits ?Awareness: Intellectual ?Problem Solving: Slow processing, Difficulty sequencing, Requires verbal cues ?General Comments: frequent cues for safety. ?  ?  ?   ?Exercises   ? ?  ?Shoulder Instructions   ? ? ?  ?General Comments    ? ? ?Pertinent Vitals/ Pain       Pain Assessment ?Pain Assessment: No/denies pain ? ?Home Living   ?  ?  ?  ?  ?  ?  ?  ?  ?  ?  ?  ?  ?  ?  ?  ?  ?  ?  ? ?  ?Prior Functioning/Environment    ?  ?  ?  ?   ? ?Frequency ? Min 2X/week  ? ? ? ? ?  ?Progress Toward Goals ? ?OT Goals(current goals can now be found in the care plan section) ? Progress towards OT goals: Progressing toward goals ? ?Acute Rehab OT Goals ?Patient Stated Goal: get better ?OT Goal Formulation: With patient ?Time For Goal Achievement: 08/28/21 ?Potential to Achieve Goals: Good ?ADL Goals ?Pt Will Perform Lower Body Dressing: with modified independence;sit to/from stand ?Pt Will Transfer to Toilet: with modified independence;ambulating ?Additional ADL Goal #1: Pt will demonstrate anticipatory awareness for safe engagement in ADL/IADL and functional mobility. ?Additional ADL Goal #2: Pt will complete multistep cog task with <3 errors and minimal cues.  ?Plan Discharge plan remains appropriate   ? ?Co-evaluation ? ? ?   ?  ?  ?  ?  ? ?  ?AM-PAC OT "6 Clicks" Daily Activity     ?Outcome Measure ? ? Help from another person eating meals?: A Little ?Help from another person taking care of personal grooming?: A Little ?Help from another person toileting, which includes using toliet, bedpan, or urinal?: A Lot ?Help from another person bathing (including washing,  rinsing, drying)?: A Lot ?Help from another person to put on and taking off regular upper body clothing?: A Little ?Help from another person to put on and taking off regular lower body clothing?: A Lot ?6 Click Score: 15 ? ?  ?End of Session Equipment Utilized During Treatment: Gait belt ? ?OT Visit Diagnosis: Other abnormalities of gait and mobility (R26.89);Muscle weakness (generalized) (M62.81);Other symptoms and signs involving cognitive function ?  ?Activity Tolerance Patient tolerated treatment well ?  ?Patient Left in bed;with call bell/phone within reach;with bed alarm set ?  ?Nurse Communication Mobility status ?  ? ?   ? ?Time: 9030-0923 ?OT Time Calculation (min): 25 min ? ?Charges: OT General Charges ?$OT Visit: 1 Visit ?OT Treatments ?$Self Care/Home Management : 23-37 mins ? ?Lodema Hong, OTA ?Acute Rehabilitation Services  ?Pager [phone] ?Office [phone] ? ? ?[place] ?08/18/2021, 10:38 AM ?

## 2021-08-19 LAB — BASIC METABOLIC PANEL
Anion gap: 8 (ref 5–15)
BUN: 7 mg/dL (ref 6–20)
CO2: 22 mmol/L (ref 22–32)
Calcium: 9.2 mg/dL (ref 8.9–10.3)
Chloride: 106 mmol/L (ref 98–111)
Creatinine, Ser: 0.75 mg/dL (ref 0.61–1.24)
GFR, Estimated: >60 mL/min (ref >60)
Glucose, Bld: 88 mg/dL (ref 70–99)
Potassium: 3.5 mmol/L (ref 3.5–5.1)
Sodium: 136 mmol/L (ref 135–145)

## 2021-08-19 NOTE — TOC Progression Note (Signed)
Transition of Care (TOC) - Progression Note  ? ? ?Patient Details  ?Name: Luis Porter ?MRN: [id number] ?Date of Birth: 1960-12-20 ? ?Transition of Care (TOC) CM/SW Contact  ?Benard Halsted, LCSW ?Phone Number: ?08/19/2021, 2:01 PM ? ?Clinical Narrative:    ?CSW spoke with [facility] who is able to accept patient pending being off of bed alarm for 24 hours and pending insurance approval but they will start process.  ? ? ?Expected Discharge Plan: [place] ?Barriers to Discharge: Insurance Authorization ? ?Expected Discharge Plan and Services ?Expected Discharge Plan: [place] ?In-house Referral: Clinical Social Work ?  ?Post Acute Care Choice: [place] ?Living arrangements for the past 2 months: [place] ?                ?  ?  ?  ?  ?  ?  ?  ?  ?  ?  ? ? ?Social Determinants of Health (SDOH) Interventions ?  ? ?Readmission Risk Interventions ?   ? View : No data to display.  ?  ?  ?  ? ? ?

## 2021-08-19 NOTE — Progress Notes (Signed)
Pt requesting to shower, but has unsteady gait. Bed bath offered and [place],MD notified. ?

## 2021-08-19 NOTE — Plan of Care (Signed)

## 2021-08-19 NOTE — Progress Notes (Signed)
?Progress Note ? ? ?Patient: Luis Porter IRS:[id number] DOB: 1961-02-02 DOA: 08/13/2021     6 ?DOS: the patient was seen and examined on 08/19/2021 ?  ? ? ? ? ?Brief hospital course: ?Luis Porter is a 61 y.o. M with HTN, sCHF and alcohol dependence who presented with altered mental status and generalized weakness.   ? ?In the ER, CT head normal.  Noted to be intoxicated.  Admitted for withdrawal and confusion. ? ? ? ? ? ? ?Assessment and Plan: ?* Alcohol abuse with intoxication, with delirium ([place]) ?Patient confused at presentation.  Per report from wife at time of admission, his memory loss and falls/ataxia were subacute, over weeks to months prior to arrival. ? ?Wernicke's was suspected over withdrawal delirium, and so high dose thiamine started. ? ?CT head normal, stroke ruled out.  Ammonia near normal.    Completed high dose IV thiamine and continues oral.  B1 level actually high.  ?if this was after treatment started. ? ?This still seems to be within the range of Wernicke's encpahlopathy.   ?-Continue thiamine and folate ?- Finish Valium taper ?- Check TSH and RPR ?- If normal TSH RPR, I think Wernicke's is most likely, and would expect slow improvement, would encourage short interval outpatient Neurology follow up ?  ? ? ? ? ?Ataxia ?See above.  Remains unsafe to be alone. ? ?Macrocytosis ?Due to alcohol ? ? ?Transaminitis ?Alcoholic hepatitis. ? ? ?Acute metabolic encephalopathy ?See above ? ?Chronic combined systolic and diastolic heart failure ([place]) ?Last echocardiogram revealed EF of 35% with global hypokinesis in 03/2021.   ?Euvolemic here. ?-Continue metoprolol, Entresto, spironolactone ? ?Syncope ?Due to intoxication ? ?Myocardial injury ?No signs or symptmos of ischemia. ? ?GERD (gastroesophageal reflux disease) ?-Continue PPI ? ?Essential hypertension ?BP stable ?- Continue Entresto and metoprolol and spironolactone  ? ? ? ? ?  ? ?Subjective: Still complains of ataxia, imbalance, no falls  overnight.  He had no fever, cough, chest pain.  Still having some hallucinations. ? ?Physical Exam: ?Vitals:  ? 08/19/21 0500 08/19/21 0719 08/19/21 0944 08/19/21 1147  ?BP:  (!) 122/91 (!) 147/124 (!) 155/140  ?Pulse: 64 78  (!) 111  ?Resp: '12 16  20  '$ ?Temp:  97.7 ?F (36.5 ?C)  98 ?F (36.7 ?C)  ?TempSrc:  Oral  Oral  ?SpO2: 96% 99%  93%  ?Weight:      ?Height:      ? ?Elderly adult male, appears somewhat disheveled, lying in bed, interactive ?With standing, the patient loses his balance with just walking a few feet. ?Heart RRR, no murmurs, no lower extremity edema, JVP normal ?Normal respiratory rate and rhythm, lungs clear without rales or wheezes ?Abdomen soft no tenderness palpation or guarding, no ascites or distention ?Attention distracted, judgment insight appear impaired.  He has an intention tremor. ? ? ? ? ?Data Reviewed: ?Nursing notes reviewed, vital signs reviewed, therapy notes reviewed. ?Patient metabolic panel shows normal no changes, no abnormalities, hyponatremia has resolved ? ?Family Communication:   ? ?Disposition: ?Status is: Inpatient ?Remains inpatient appropriate because:  ?The patient has resolving Warnicke's encephalopathy.  Medical treatment at this point is stabilized on outpatient regimen, and as soon as his hallucinations and ataxia resolved to the point that he is safe and a safe disposition can be found we will discharge ? ? ? Planned Discharge Destination: Home with Home Health ? ? ? ?  ? ?Author: ?Edwin Dada, MD ?08/19/2021 1:46 PM ? ?For on call review [email or web address].  ?

## 2021-08-19 NOTE — Progress Notes (Signed)
Physical Therapy Treatment ?Patient Details ?Name: Luis Porter ?MRN: [id number] ?DOB: 11/06/1960 ?Today's Date: 08/19/2021 ? ? ?History of Present Illness 61 y.o. male  presents 08/13/21 due to altered mental status. +ETOH abuse (18-24 beers/day). Recent fall with laceration to back of head.  PMH significant of hypertension, dilated cardiomyopathy, chronic combined systolic and diastolic CHF, and GERD ? ?  ?PT Comments  ? ? Patient with a fall 3/22 and now agreeable to using RW. Still required min assist with RW when ambulating, but ability to self-correct when he loses his balance was improved. Will need further education with RW. Understand from LCSW notes that pt's wife cannot manage him at home in current condition and pt now seeking substance abuse rehab. He will need to be modified independent with device to qualify for SA rehab. Will continue to work on Systems analyst and balance.  ?   ?Recommendations for follow up therapy are one component of a multi-disciplinary discharge planning process, led by the attending physician.  Recommendations may be updated based on patient status, additional functional criteria and insurance authorization. ? ?Follow Up Recommendations ? Other (comment) (pt planning on substance abuse rehab) ?  ?  ?Assistance Recommended at Discharge Frequent or constant Supervision/Assistance  ?Patient can return home with the following Assistance with cooking/housework;Assistance with feeding;Direct supervision/assist for medications management;Direct supervision/assist for financial management;A little help with walking and/or transfers;Help with stairs or ramp for entrance ?  ?Equipment Recommendations ? Rolling walker (2 wheels)  ?  ?Recommendations for Other Services   ? ? ?  ?Precautions / Restrictions Precautions ?Precautions: Fall ?Restrictions ?Weight Bearing Restrictions: No  ?  ? ?Mobility ? Bed Mobility ?Overal bed mobility: Needs Assistance ?Bed Mobility: Supine to Sit ?  ?   ?Supine to sit: Supervision ?  ?  ?General bed mobility comments: supervision for safety; denied dizziness ?  ? ?Transfers ?Overall transfer level: Needs assistance ?Equipment used: None, Rolling walker (2 wheels) ?Transfers: Sit to/from Stand ?Sit to Stand: Min assist ?  ?  ?  ?  ?  ?General transfer comment: min assist to steady; cues for safe use of RW ?  ? 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  ?Gait velocity interpretation: >2.62 ft/sec, indicative of community ambulatory ?  ?General Gait Details: drifts to his left more than to right (especially when pt voluntarily turning his head to look around at his surroundings); better balance with use of RW and pt agrees ? ? ?Stairs ?  ?  ?  ?  ?  ? ? ?Wheelchair Mobility ?  ? ?Modified Rankin (Stroke Patients Only) ?  ? ? ?  ?Balance Overall balance assessment: Needs assistance ?Sitting-balance support: Feet supported, No upper extremity supported ?Sitting balance-Leahy Scale: Good ?  ?  ?Standing balance support: No upper extremity supported ?Standing balance-Leahy Scale: Poor ?  ?  ?  ?  ?  ?  ?  ?  ?  ?  ?  ?  ?  ? ?  ?Cognition Arousal/Alertness: Awake/alert ?Behavior During Therapy: [facility] for tasks assessed/performed, Impulsive ?Overall Cognitive Status: No family/caregiver present to determine baseline cognitive functioning ?Area of Impairment: Awareness, Safety/judgement, Problem solving ?  ?  ?  ?  ?  ?  ?  ?  ?Orientation Level:  (WNL) ?Current Attention Level: Sustained ?Memory: Decreased  recall of precautions, Decreased short-term memory ?Following Commands: Follows one step commands consistently ?Safety/Judgement: Decreased awareness of safety, Decreased awareness of deficits ?Awareness: Intellectual ?Problem  Solving: Slow processing, Difficulty sequencing, Requires verbal cues ?General Comments: got up alone yesterday and fell; takes hands off the walker periodically; poor recall of events ?  ?  ? ?  ?Exercises   ? ?  ?General Comments General comments (skin integrity, edema, etc.): see vitals flowsheet for orthostatic BPs which were negative ?  ?  ? ?Pertinent Vitals/Pain Pain Assessment ?Pain Assessment: No/denies pain  ? ? ?Home Living   ?  ?  ?  ?  ?  ?  ?  ?  ?  ?   ?  ?Prior Function    ?  ?  ?   ? ?PT Goals (current goals can now be found in the care plan section) Acute Rehab PT Goals ?Patient Stated Goal: wants to return home wtih wife ?Time For Goal Achievement: 08/28/21 ?Potential to Achieve Goals: Fair ?Progress towards PT goals: Progressing toward goals ? ?  ?Frequency ? ? ? Min 3X/week ? ? ? ?  ?PT Plan Discharge plan needs to be updated (wife reports cannot manage pt at home)  ? ? ?Co-evaluation   ?  ?  ?  ?  ? ?  ?AM-PAC PT "6 Clicks" Mobility   ?Outcome Measure ? Help needed turning from your back to your side while in a flat bed without using bedrails?: None ?Help needed moving from lying on your back to sitting on the side of a flat bed without using bedrails?: None ?Help needed moving to and from a bed to a chair (including a wheelchair)?: A Little ?Help needed standing up from a chair using your arms (e.g., wheelchair or bedside chair)?: A Little ?Help needed to walk in hospital room?: A Little ?Help needed climbing 3-5 steps with a railing? : A Little ?6 Click Score: 20 ? ?  ?End of Session Equipment Utilized During Treatment: Gait belt ?Activity Tolerance: Patient tolerated treatment well ?Patient left: with call bell/phone within reach;in chair;with chair alarm set (pt refused sitting up in recliner due to discomfort yesterday when sitting up) ?Nurse Communication: Mobility status;Other (comment) (negative orthostatics) ?PT Visit Diagnosis: Difficulty in walking, not elsewhere classified (R26.2) ?   ? ? ?Time: 3149-7026 ?PT Time Calculation (min) (ACUTE ONLY): 42 min ? ?Charges:  $Gait Training: 38-52 mins          ?          ? ? ?Arby Barrette, PT ?Acute Rehabilitation Services  ?Pager [phone] ?Office [phone] ? ? ? ?Jeanie Cooks Corena Tilson ?08/19/2021, 9:49 AM ? ?

## 2021-08-20 LAB — TSH: TSH: 1.823 u[IU]/mL (ref 0.350–4.500)

## 2021-08-20 MED ORDER — IBUPROFEN 600 MG PO TABS: 600.0000 mg | ORAL_TABLET | Freq: Once | ORAL | Status: DC

## 2021-08-20 NOTE — Progress Notes (Addendum)
?  Progress Note ? ? ?Patient: Luis Porter QKM:[id number] DOB: [birth year] DOA: 08/13/2021     7 ?DOS: the patient was seen and examined on 08/20/2021 ?  ? ? ? ? ?Brief hospital course: ?Luis Porter is a 61 y.o. M with HTN, sCHF and alcohol dependence who presented with altered mental status and generalized weakness.   ? ?In the ER, CT head normal.  Noted to be intoxicated.  Admitted for withdrawal and confusion. ? ? ? ? ? ? ?Assessment and Plan: ?* Alcohol abuse with intoxication, with delirium ([place]) ?-Continue thiamine and folate ?- Check TSH and RPR ?  ? ? ?  ? ?Chronic combined systolic and diastolic heart failure ([place]) ?Euvolemic here. ?-Continue metoprolol, Entresto, spironolactone ?  ? ?GERD (gastroesophageal reflux disease) ?-Continue PPI ? ?Essential hypertension ?BP stable ?- Continue Entresto and metoprolol and spironolactone  ? ? ? ? ?  ? ?Subjective: Some improvements, but still extremely ataxic, but able to ambulate without assistance.  No fever, vomiting. ? ?Physical Exam: ?Vitals:  ? 08/20/21 0345 08/20/21 0719 08/20/21 1203 08/20/21 1555  ?BP: 120/77 118/81 109/75 106/72  ?Pulse: 66 88 90 84  ?Resp: '18 15 15 15  '$ ?Temp: 98 ?F (36.7 ?C) 98.1 ?F (36.7 ?C) 98.2 ?F (36.8 ?C) 98.4 ?F (36.9 ?C)  ?TempSrc: Oral Oral Oral Oral  ?SpO2: 93% 96% 98% (!) 83%  ?Weight:      ?Height:      ? ?Elderly adult male, appears somewhat disheveled, lying in bed, interactive ?With standing, the patient loses his balance with just walking a few feet. ?Heart RRR, no murmurs, no lower extremity edema, JVP normal ?Normal respiratory rate and rhythm, lungs clear without rales or wheezes ?Abdomen soft no tenderness palpation or guarding, no ascites or distention ?Attention distracted, judgment insight appear impaired.  He has an intention tremor. ? ? ? ? ?Data Reviewed: ?Nursing notes reviewed, vital signs reviewed, therapy notes reviewed. ?No other new labs ? ?Family Communication:   ? ?Disposition: ?Status is:  Inpatient ?Remains inpatient appropriate because:  ?The patient has resolving Warnicke's encephalopathy.  Medical treatment at this point is stabilized on outpatient regimen, and as soon as his hallucinations and ataxia resolved to the point that he is safe and a safe disposition can be found we will discharge ? ? ? Planned Discharge Destination: SNF ? ? ?  ? ?Author: ?Edwin Dada, MD ?08/20/2021 3:58 PM ? ?For on call review [email or web address].  ?

## 2021-08-20 NOTE — Progress Notes (Signed)
Physical Therapy Treatment ?Patient Details ?Name: Luis Porter ?MRN: [id number] ?DOB: Jul 16, 1960 ?Today's Date: 08/20/2021 ? ? ?History of Present Illness 61 y.o. male  presents 08/13/21 due to altered mental status. +ETOH abuse (18-24 beers/day). Recent fall with laceration to back of head.  PMH significant of hypertension, dilated cardiomyopathy, chronic combined systolic and diastolic CHF, and GERD ? ?  ?PT Comments  ? ? Pt was seen for lengthy session in which PT noted LE weakness on B ankles, instability with all gait and safety concerns for his mobility on RW.  Pt is impulsive and asked to use walker with one hand, then was running into obstacles despite PT asking him to correct his path.  Pt is expecting to go directly home per his conversation today, but notes his wife is out of town currently and his car keys are not with anyone per their conversation with him.  Pt is recommended to have 24/7 care, and will anticipate rehab from the hospital.  If the plan for EtOH rehab is not possible then PT will still recommend inpt care with SNF possibly to ensure pt continues to work on safety and strengthening.  His LE strength may have come in part from a previous spinal surgery with R foot numbness per his report. ?   ?Recommendations for follow up therapy are one component of a multi-disciplinary discharge planning process, led by the attending physician.  Recommendations may be updated based on patient status, additional functional criteria and insurance authorization. ? ?Follow Up Recommendations ? Other (comment) (rehab expected for EtOH) ?  ?  ?Assistance Recommended at Discharge Frequent or constant Supervision/Assistance  ?Patient can return home with the following Assistance with cooking/housework;Assistance with feeding;Direct supervision/assist for medications management;Direct supervision/assist for financial management;A little help with walking and/or transfers;Help with stairs or ramp for  entrance;A little help with bathing/dressing/bathroom ?  ?Equipment Recommendations ? Rolling walker (2 wheels)  ?  ?Recommendations for Other Services   ? ? ?  ?Precautions / Restrictions Precautions ?Precautions: Fall ?Restrictions ?Weight Bearing Restrictions: No  ?  ? ?Mobility ? Bed Mobility ?Overal bed mobility: Needs Assistance ?  ?  ?  ?  ?  ?  ?General bed mobility comments: in chair when PT arrived ?  ? ?Transfers ?Overall transfer level: Needs assistance ?Equipment used: Rolling walker (2 wheels) ?Transfers: Sit to/from Stand ?Sit to Stand: Min guard ?  ?  ?  ?  ?  ?General transfer comment: once up is safe if he does not immediately step ?  ? ?Ambulation/Gait ?Ambulation/Gait assistance: Min assist ?Gait Distance (Feet): 450 Feet ?Assistive device: Rolling walker (2 wheels) ?Gait Pattern/deviations: Step-through pattern, Decreased stride length, Wide base of support, Drifts right/left ?Gait velocity: reduced ?Gait velocity interpretation: <1.31 ft/sec, indicative of household ambulator ?  ?General Gait Details: pt asks to use just one hand on RW, and did not really seem to understand the safety issue ? ? ?Stairs ?  ?  ?  ?  ?  ? ? ?Wheelchair Mobility ?  ? ?Modified Rankin (Stroke Patients Only) ?  ? ? ?  ?Balance Overall balance assessment: Needs assistance ?Sitting-balance support: Feet supported, No upper extremity supported ?Sitting balance-Leahy Scale: Good ?  ?  ?Standing balance support: Bilateral upper extremity supported ?Standing balance-Leahy Scale: Poor ?Standing balance comment: requires a HHA to manage any standing balance today ?  ?  ?  ?  ?  ?  ?  ?  ?  ?  ?  ?  ? ?  ?  Cognition Arousal/Alertness: Awake/alert ?Behavior During Therapy: Impulsive ?Overall Cognitive Status: No family/caregiver present to determine baseline cognitive functioning ?  ?  ?  ?  ?  ?  ?  ?  ?  ?Orientation Level: Situation ?Current Attention Level: Selective ?Memory: Decreased recall of precautions, Decreased  short-term memory ?Following Commands: Follows one step commands inconsistently, Follows one step commands with increased time ?Safety/Judgement: Decreased awareness of deficits, Decreased awareness of safety ?Awareness: Intellectual ?Problem Solving: Slow processing, Requires verbal cues, Requires tactile cues, Difficulty sequencing ?General Comments: safety reminders for gait, frequently is running into obstacles and cannot problem solve extricating from them ?  ?  ? ?  ?Exercises   ? ?  ?General Comments General comments (skin integrity, edema, etc.): pt was seen for mobility on the hallway and located his struggle with navigating on walker, and with avoiding obstacles.  Pt is impulsive and wants to roll walker one handed ?  ?  ? ?Pertinent Vitals/Pain Pain Assessment ?Pain Assessment: No/denies pain  ? ? ?Home Living   ?  ?  ?  ?  ?  ?  ?  ?  ?  ?   ?  ?Prior Function    ?  ?  ?   ? ?PT Goals (current goals can now be found in the care plan section) Acute Rehab PT Goals ?Patient Stated Goal: wants to return home wtih wife ? ?  ?Frequency ? ? ? Min 3X/week ? ? ? ?  ?PT Plan Discharge plan needs to be updated  ? ? ?Co-evaluation   ?  ?  ?  ?  ? ?  ?AM-PAC PT "6 Clicks" Mobility   ?Outcome Measure ? Help needed turning from your back to your side while in a flat bed without using bedrails?: None ?Help needed moving from lying on your back to sitting on the side of a flat bed without using bedrails?: A Little ?Help needed moving to and from a bed to a chair (including a wheelchair)?: A Little ?Help needed standing up from a chair using your arms (e.g., wheelchair or bedside chair)?: A Little ?Help needed to walk in hospital room?: A Little ?Help needed climbing 3-5 steps with a railing? : A Lot ?6 Click Score: 18 ? ?  ?End of Session Equipment Utilized During Treatment: Gait belt ?Activity Tolerance: Patient tolerated treatment well ?Patient left: with call bell/phone within reach;in chair;with chair alarm  set ?Nurse Communication: Mobility status;Other (comment) ?PT Visit Diagnosis: Difficulty in walking, not elsewhere classified (R26.2) ?  ? ? ?Time: 4562-5638 ?PT Time Calculation (min) (ACUTE ONLY): 42 min ? ?Charges:  $Gait Training: 8-22 mins ?$Therapeutic Activity: 8-22 mins ?$Neuromuscular Re-education: 8-22 mins    ?Ramond Dial ?08/20/2021, 12:39 PM ? ?Mee Hives, PT PhD ?Acute Rehab Dept. Number: [facility] [phone] and [place] [phone] ? ? ?

## 2021-08-20 NOTE — Progress Notes (Signed)
Occupational Therapy Treatment ?Patient Details ?Name: Luis Porter ?MRN: [id number] ?DOB: 1961/01/08 ?Today's Date: 08/20/2021 ? ? ?History of present illness 61 y.o. male  presents 08/13/21 due to altered mental status. +ETOH abuse (18-24 beers/day). Recent fall with laceration to back of head.  PMH significant of hypertension, dilated cardiomyopathy, chronic combined systolic and diastolic CHF, and GERD ?  ?OT comments ? Patient seen by skilled OT to address self care with shower, grooming, and dressing. Patient was min guard while performing standing tasks with improved balance with occasional stagger and patient able to correct. Patient continues to demonstrate poor safety.  Patient currently plans to discharge to substance abuse rehab. Acute OT to continue to follow.   ? ?Recommendations for follow up therapy are one component of a multi-disciplinary discharge planning process, led by the attending physician.  Recommendations may be updated based on patient status, additional functional criteria and insurance authorization. ?   ?Follow Up Recommendations ? Other (comment) (Patient planning on substance abuse rehab facility))  ?  ?Assistance Recommended at Discharge Frequent or constant Supervision/Assistance  ?Patient can return home with the following ? Direct supervision/assist for medications management;Direct supervision/assist for financial management;A little help with walking and/or transfers;A lot of help with bathing/dressing/bathroom ?  ?Equipment Recommendations ? Other (comment) (TBD)  ?  ?Recommendations for Other Services   ? ?  ?Precautions / Restrictions Precautions ?Precautions: Fall ?Restrictions ?Weight Bearing Restrictions: No  ? ? ?  ? ?Mobility Bed Mobility ?Overal bed mobility: Needs Assistance ?Bed Mobility: Supine to Sit ?  ?  ?Supine to sit: Supervision ?  ?  ?General bed mobility comments: supervision to get to EOB and in recliner at end of session ?  ? ?Transfers ?Overall  transfer level: Needs assistance ?Equipment used: None ?Transfers: Sit to/from Stand ?Sit to Stand: Min guard ?  ?  ?  ?  ?  ?General transfer comment: min guard for safety with transfers to shower chair and recliner ?  ?  ?Balance Overall balance assessment: Needs assistance ?Sitting-balance support: Feet supported, No upper extremity supported ?Sitting balance-Leahy Scale: Good ?  ?  ?Standing balance support: No upper extremity supported ?Standing balance-Leahy Scale: Poor ?Standing balance comment: slight staggering but was able to catch self ?  ?  ?  ?  ?  ?  ?  ?  ?  ?  ?  ?   ? ?ADL either performed or assessed with clinical judgement  ? ?ADL Overall ADL's : Needs assistance/impaired ?  ?  ?Grooming: Wash/dry hands;Wash/dry face;Oral care;Applying deodorant;Standing;Min guard ?Grooming Details (indicate cue type and reason): min guard for safety ?Upper Body Bathing: Supervision/ safety;Sitting ?Upper Body Bathing Details (indicate cue type and reason): performed seated in shower ?Lower Body Bathing: Min guard;Sit to/from stand ?Lower Body Bathing Details (indicate cue type and reason): bathed peri area standing in shower ?Upper Body Dressing : Minimal assistance;Sitting ?Upper Body Dressing Details (indicate cue type and reason): changed gown ?Lower Body Dressing: Supervision/safety;Sitting/lateral leans ?Lower Body Dressing Details (indicate cue type and reason): donned sandles ?  ?  ?  ?  ?Tub/ Shower Transfer: Walk-in shower;Grab bars;Min guard ?Tub/Shower Transfer Details (indicate cue type and reason): min guard for shower transfer for safety ?  ?General ADL Comments: min assist for mobility and min guard for transfers ?  ? ?Extremity/Trunk Assessment   ?  ?  ?  ?  ?  ? ?Vision   ?  ?  ?Perception   ?  ?Praxis   ?  ? ?  Cognition Arousal/Alertness: Awake/alert ?Behavior During Therapy: [facility] for tasks assessed/performed, Impulsive ?Overall Cognitive Status: No family/caregiver present to determine baseline  cognitive functioning ?Area of Impairment: Awareness, Safety/judgement, Problem solving ?  ?  ?  ?  ?  ?  ?  ?  ?  ?Current Attention Level: Sustained ?Memory: Decreased recall of precautions, Decreased short-term memory ?Following Commands: Follows one step commands consistently ?Safety/Judgement: Decreased awareness of safety, Decreased awareness of deficits ?Awareness: Intellectual ?Problem Solving: Slow processing, Difficulty sequencing, Requires verbal cues ?General Comments: asked why gait belt was necessary, requires reminders for safety ?  ?  ?   ?Exercises   ? ?  ?Shoulder Instructions   ? ? ?  ?General Comments    ? ? ?Pertinent Vitals/ Pain       Pain Assessment ?Pain Assessment: No/denies pain ? ?Home Living   ?  ?  ?  ?  ?  ?  ?  ?  ?  ?  ?  ?  ?  ?  ?  ?  ?  ?  ? ?  ?Prior Functioning/Environment    ?  ?  ?  ?   ? ?Frequency ? Min 2X/week  ? ? ? ? ?  ?Progress Toward Goals ? ?OT Goals(current goals can now be found in the care plan section) ? Progress towards OT goals: Progressing toward goals ? ?Acute Rehab OT Goals ?Patient Stated Goal: get better ?OT Goal Formulation: With patient ?Time For Goal Achievement: 08/28/21 ?Potential to Achieve Goals: Good ?ADL Goals ?Pt Will Perform Lower Body Dressing: with modified independence;sit to/from stand ?Pt Will Transfer to Toilet: with modified independence;ambulating ?Additional ADL Goal #1: Pt will demonstrate anticipatory awareness for safe engagement in ADL/IADL and functional mobility. ?Additional ADL Goal #2: Pt will complete multistep cog task with <3 errors and minimal cues.  ?Plan Discharge plan remains appropriate   ? ?Co-evaluation ? ? ?   ?  ?  ?  ?  ? ?  ?AM-PAC OT "6 Clicks" Daily Activity     ?Outcome Measure ? ? Help from another person eating meals?: None ?Help from another person taking care of personal grooming?: A Little ?Help from another person toileting, which includes using toliet, bedpan, or urinal?: A Little ?Help from another person  bathing (including washing, rinsing, drying)?: A Little ?Help from another person to put on and taking off regular upper body clothing?: A Little ?Help from another person to put on and taking off regular lower body clothing?: A Little ?6 Click Score: 19 ? ?  ?End of Session Equipment Utilized During Treatment: Gait belt ? ?OT Visit Diagnosis: Other abnormalities of gait and mobility (R26.89);Muscle weakness (generalized) (M62.81);Other symptoms and signs involving cognitive function ?  ?Activity Tolerance Patient tolerated treatment well ?  ?Patient Left in chair;with call bell/phone within reach;with chair alarm set ?  ?Nurse Communication Mobility status ?  ? ?   ? ?Time: 7494-4967 ?OT Time Calculation (min): 42 min ? ?Charges: OT General Charges ?$OT Visit: 1 Visit ?OT Treatments ?$Self Care/Home Management : 38-52 mins ? ?Lodema Hong, OTA ?Acute Rehabilitation Services  ?Pager [phone] ?Office [phone] ? ? ?[place] ?08/20/2021, 10:46 AM ?

## 2021-08-20 NOTE — TOC Progression Note (Signed)
Transition of Care (TOC) - Progression Note  ? ? ?Patient Details  ?Name: Luis Porter ?MRN: [id number] ?Date of Birth: 10-13-1960 ? ?Transition of Care (TOC) CM/SW Contact  ?Benard Halsted, LCSW ?Phone Number: ?08/20/2021, 3:06 PM ? ?Clinical Narrative:    ?[facility] still awaiting insurance authorization.  ? ? ?Expected Discharge Plan: [place] ?Barriers to Discharge: Insurance Authorization ? ?Expected Discharge Plan and Services ?Expected Discharge Plan: [place] ?In-house Referral: Clinical Social Work ?  ?Post Acute Care Choice: [place] ?Living arrangements for the past 2 months: [place] ?                ?  ?  ?  ?  ?  ?  ?  ?  ?  ?  ? ? ?Social Determinants of Health (SDOH) Interventions ?  ? ?Readmission Risk Interventions ?   ? View : No data to display.  ?  ?  ?  ? ? ?

## 2021-08-21 LAB — RPR: RPR Ser Ql: NONREACTIVE

## 2021-08-21 NOTE — Progress Notes (Signed)
?  Progress Note ? ? ?Patient: Luis Porter HMC:[id number] DOB: 1961/04/03 DOA: 08/13/2021     8 ?DOS: the patient was seen and examined on 08/21/2021 ?  ? ? ? ? ?Brief hospital course: ?Mr. Byrnes is a 61 y.o. M with HTN, sCHF and alcohol dependence who presented with altered mental status and generalized weakness.   ? ?In the ER, CT head normal.  Noted to be intoxicated.  Admitted for withdrawal and confusion. ? ? ? ? ? ? ?Assessment and Plan: ?* Alcohol abuse with intoxication, with delirium ([place]) ?TSH and RPR normal ?-Continue thiamine and folate ? ?  ? ? ?  ? ?Chronic combined systolic and diastolic heart failure ([place]) ?Euvolemic here. ?Continue metoprolol, Entresto, spironolactone ?  ? ?GERD (gastroesophageal reflux disease) ?-Continue PPI ? ?Essential hypertension ?BP stable ?- Continue Entresto and metoprolol and spironolactone  ? ? ? ? ?  ? ?Subjective: Some improvements, but still extremely ataxic, but able to ambulate without assistance.  No fever, vomiting. ? ?Physical Exam: ?Vitals:  ? 08/20/21 1949 08/20/21 2330 08/21/21 0500 08/21/21 1215  ?BP: 113/89 118/80 104/75 (!) 126/107  ?Pulse: 80 69 95   ?Resp: '18 18  16  '$ ?Temp: 98.2 ?F (36.8 ?C) 98.1 ?F (36.7 ?C) 98.1 ?F (36.7 ?C) 98.7 ?F (37.1 ?C)  ?TempSrc: Oral Oral Oral Oral  ?SpO2: 97% 97% 93%   ?Weight:      ?Height:      ? ?Elderly adult male, sitting in recliner, interactive ?With standing, the patient loses his balance with just walking a few feet. ?Heart RRR, no murmurs, no lower extremity edema, JVP normal ?Normal respiratory rate and rhythm, lungs clear without rales or wheezes ?Abdomen soft no tenderness palpation or guarding, no ascites or distention ?Attention distracted, judgment insight appear impaired.  He has an intention tremor. ? ? ? ? ?Data Reviewed: ?Nursing notes reviewed, vital signs reviewed, therapy notes reviewed. ?No other new labs ? ?Family Communication:   ? ?Disposition: ?Status is: Inpatient ?Remains inpatient appropriate  because:  ?The patient has resolving Warnicke's encephalopathy.  Medical treatment at this point is stabilized on outpatient regimen, and as soon as his hallucinations and ataxia resolved to the point that he is safe and a safe disposition can be found we will discharge, his wfie will be home tomorrow and can discharge then ? ? ? Planned Discharge Destination: SNF ? ? ?  ? ?Author: ?Edwin Dada, MD ?08/21/2021 4:29 PM ? ?For on call review [email or web address]  ?

## 2021-08-22 MED ORDER — THIAMINE HCL 100 MG PO TABS: 100.0000 mg | ORAL_TABLET | Freq: Every day | ORAL | 3 refills | Status: AC

## 2021-08-22 MED ORDER — SACUBITRIL-VALSARTAN 24-26 MG PO TABS: 1.0000 | ORAL_TABLET | Freq: Two times a day (BID) | ORAL | 2 refills | Status: AC

## 2021-08-22 MED ORDER — METOPROLOL SUCCINATE ER 100 MG PO TB24: 100.0000 mg | ORAL_TABLET | Freq: Every day | ORAL | 3 refills | Status: DC

## 2021-08-22 MED ORDER — SPIRONOLACTONE 25 MG PO TABS: 25.0000 mg | ORAL_TABLET | Freq: Every day | ORAL | 2 refills | Status: DC

## 2021-08-22 MED ORDER — TRAZODONE HCL 50 MG PO TABS: 50.0000 mg | ORAL_TABLET | Freq: Every day | ORAL | 2 refills | Status: AC

## 2021-08-22 MED ORDER — HYDROXYZINE HCL 25 MG PO TABS: 25.0000 mg | ORAL_TABLET | Freq: Four times a day (QID) | ORAL | 0 refills | Status: AC | PRN

## 2021-08-22 NOTE — Progress Notes (Signed)
Pt discharged to home. DC instructions given. No concerns voiced. Pt  encouraged to stop by pharmacy and pick up meds that were e-prescribed by Provider. Voiced understanding. Left unit in wheelchair pushed by hospital volunteer. Left in stable condition. Voucher for taxicab given and [employer] service called for pick up.  ?Maretta Bees, RN. ?

## 2021-08-22 NOTE — Discharge Summary (Signed)
?Physician Discharge Summary ?  ?Patient: Luis Porter MRN: [id number] DOB: 1960/07/26  ?Admit date:     08/13/2021  ?Discharge date: 08/22/21  ?Discharge Physician: Luis Porter  ? ?PCP: Luis Hauser, DO  ? ?Recommendations at discharge:  ?Follow up with Dr. Parks Porter ?Follow up with Cardiology Dr. Aundra Porter ?Follow up with with behavioral health for substance use treatment enrollment ?Follow up with Neurology in 4-6 weeks for dementia evaluation and evaluation for Wernicke's encephalopathy ? ? ? ? ? ?Discharge Diagnoses: ?Principal Problem: ?  Alcohol abuse with intoxication, with delirium ([place]) ?Active Problems: ?  Wernicke's encephalopathy ?  Essential hypertension ?  GERD (gastroesophageal reflux disease) ?  Myocardial injury ?  Syncope ?  Chronic combined systolic and diastolic heart failure ([place]) ?  Acute metabolic encephalopathy ?  Alcoholic hepatitis ?  Macrocytosis ?  Frequent falls ?  Prolonged QT interval ?  Ataxia ?  ? ? ? ?Hospital Course: ?Mr. Luis Porter is a 61 y.o. M with HTN, sCHF and alcohol dependence who presented with altered mental status and generalized weakness.   ? ?In the ER, CT head normal.  Noted to be intoxicated.  Admitted for withdrawal and confusion. ? ? ? ? ?* Alcohol abuse with intoxication, with delirium ([place]) ?Ataxia ?Suspected Wernicke's encephalopathy ?Patient confused at presentation.  Per report from wife at time of admission, his memory loss and falls/ataxia have been subacute, over weeks to months prior to arrival. ? ?Wernicke's was suspected over withdrawal delirium, and so high dose thiamine started. ? ?CT head normal, stroke ruled out.  Ammonia near normal.  B12 level, TSH and RPR normal.  Completed high dose IV thiamine without significant improvement.  B1 level actually high.    ? ?Slowly had improvement in ataxia, tremor, as well as resolution of disorientation and hallucinations.  Safe disposition arranged.  Refills of medications given.  See  patient instructions below. ? ? ?  ? ?Chronic combined systolic and diastolic heart failure ([place]) ?Last echocardiogram revealed EF of 35% with global hypokinesis in 03/2021.  Euvolemic here. ?  ?  ? ? ? ?  ? ?Pain control - [employer] Controlled Substance Reporting System database was reviewed.   ? ? ?Disposition: Home ?Diet recommendation:  ?Discharge Diet Orders (From admission, onward)  ? ?  Start     Ordered  ? 08/22/21 0000  Diet - low sodium heart healthy       ? 08/22/21 1233  ? ?  ?  ? ?  ? ? ?DISCHARGE MEDICATION: ?Allergies as of 08/22/2021   ? ?   Reactions  ? Benazepril Swelling, Other (See Comments)  ? Angioedema  ? Naltrexone Other (See Comments)  ? Made the patient "feel angry"  ? Nsaids Other (See Comments)  ? Due to history of ulcers, should not have this class of medication  ? ?  ? ?  ?Medication List  ?  ? ?STOP taking these medications   ? ?meclizine 25 MG tablet ?Commonly known as: ANTIVERT ?  ?MIDOL COMPLETE PO ?  ?omeprazole 40 MG capsule ?Commonly known as: PRILOSEC ?  ?ondansetron 4 MG tablet ?Commonly known as: ZOFRAN ?  ? ?  ? ?TAKE these medications   ? ?albuterol 108 (90 Base) MCG/ACT inhaler ?Commonly known as: VENTOLIN HFA ?INHALE 1-2 PUFFS INTO THE LUNGS EVERY 4 (FOUR) HOURS AS NEEDED FOR WHEEZING OR SHORTNESS OF BREATH. ?  ?fluticasone 50 MCG/ACT nasal spray ?Commonly known as: FLONASE ?PLACE 2 SPRAYS INTO BOTH NOSTRILS DAILY.  USE FOR 4-6 WEEKS THEN STOP AND USE SEASONALLY OR AS NEEDED. ?What changed:  ?when to take this ?reasons to take this ?additional instructions ?  ?folic acid 1 MG tablet ?Commonly known as: FOLVITE ?Take 1 tablet (1 mg total) by mouth daily. ?  ?hydrOXYzine 25 MG tablet ?Commonly known as: ATARAX ?Take 1 tablet (25 mg total) by mouth every 6 (six) hours as needed for anxiety (or CIWA score </= 10). ?  ?loratadine 10 MG tablet ?Commonly known as: CLARITIN ?Take 10 mg by mouth daily as needed for allergies or rhinitis. ?  ?metoprolol succinate 100 MG 24 hr  tablet ?Commonly known as: TOPROL-XL ?Take 1 tablet (100 mg total) by mouth daily. Take with or immediately following a meal. ?What changed: additional instructions ?  ?multivitamin Tabs tablet ?Take 1 tablet by mouth daily with breakfast. ?  ?sacubitril-valsartan 24-26 MG ?Commonly known as: ENTRESTO ?Take 1 tablet by mouth 2 (two) times daily. ?  ?spironolactone 25 MG tablet ?Commonly known as: ALDACTONE ?Take 1 tablet (25 mg total) by mouth daily. ?What changed: how much to take ?  ?thiamine 100 MG tablet ?Take 1 tablet (100 mg total) by mouth daily. ?  ?traZODone 50 MG tablet ?Commonly known as: DESYREL ?Take 1 tablet (50 mg total) by mouth at bedtime. ?  ?VISINE DRY EYE OP ?Place 1 drop into both eyes daily as needed (dry eyes). ?  ? ?  ? ? Contact information for follow-up providers   ? ? Luis Hauser, DO. Schedule an appointment as soon as possible for a visit in 1 week(s).   ?Specialty: Family Medicine ?Contact information: ?[address] [place] [address] ?[phone] ? ? ?  ?  ? ? [facility] Follow up.   ?Specialty: Urgent Care ?Why: This is a walk-in urgent care specifically for mental health issues, including referral to substance use treatment ?Contact information: ?[address] ?[place] ?[phone] ? ?  ?  ? ?  ?  ? ? Contact information for after-discharge care   ? ? Destination   ? ? [facility] Preferred SNF .   ?Service: Skilled Nursing ?Contact information: ?[place] ?[place] ?[phone] ? ?  ?  ? ?  ?  ? ?  ?  ? ?  ? ?Discharge Instructions   ? ? Ambulatory referral to Neurology   Complete by: As directed ?  ? An appointment is requested in approximately: 8 weeks ?For follow up of ?dementia and also suspected Wernicke's encephalopathy  ? Diet - low sodium heart healthy   Complete by: As directed ?  ? Discharge instructions   Complete by: As directed ?  ? From Dr.  Loleta Porter: ?You were admitted for alcohol related confusion. ?This was likely in part withdrawal delirium (this is also known as "DTs") but it also appears to be largely cognitive impairment ("brain damage") from alcohol. ? ?This may contnue to slowly get better. ?Take thiamine 100 mg daily and folate supplements to help this brain function ?Go see Dr. Parks Porter in 1 week ? ?Do NOT drink any alcohol at all, the withdrawal may be worse next time ? ?I recommend two things: ?Go to the [facility] on [address] in [place] (see below) for evaluation and referral to an alcohol treatment facility ? ?Go to a Neurologist.  I have sent a referral to a local Neurologist.  They will contact you.  Dr. Parks Porter  can also refer you to a local neurologist in [place] for this. ?You should be evaluated by them for dementia and for "Wernicke's encephalopathy" ? ? ?Resume your heart medicines: ?Metoprolol ?Spironolactone ?Entresto ? ? ? ?The longterm effects of alcohol are irritability and poor sleep ?These may improve over the next few months of sobriety, but for now: ?Take trazodone 25-50 mg at night for sleep (see which dose works better for you) ?Take hydroxyzine 25 mg up to three times daily for agitation or restlessness  ? Increase activity slowly   Complete by: As directed ?  ? ?  ? ? ?Discharge Exam: ?Filed Weights  ? 08/18/21 0500 08/21/21 1951 08/22/21 0414  ?Weight: 65.3 kg 65.9 kg 65.9 kg  ? ?General: Pt is alert, awake, not in acute distress, mild tremor ?Cardiovascular: RRR, nl S1-S2, no murmurs appreciated.   No LE edema.   ?Respiratory: Normal respiratory rate and rhythm.  CTAB without rales or wheezes. ?Abdominal: Abdomen soft and non-tender.  No distension or HSM.   ?Neuro/Psych: Strength symmetric in upper and lower extremities.  Gait with slightly wide-based, pigeon toed, no significant ataxia or imbalance, mildly unsteady, but able to ambulate with min guard, judgment and  insight appear normal, oriented to person, place, time, and situation, short-term memory seems impaired. ? ? ?Condition at discharge: good ? ?The results of significant diagnostics from this hospitalization (including ima

## 2021-08-22 NOTE — TOC Progression Note (Signed)
Transition of Care (TOC) - Progression Note  ? ? ?Patient Details  ?Name: Luis Porter ?MRN: [id number] ?Date of Birth: [birth year] ? ?Transition of Care (TOC) CM/SW Contact  ?[place], [place], [place] ?Phone Number: ?08/22/2021, 9:31 AM ? ?Clinical Narrative:    ?Phone call to [facility] to check status of authorization. Authorization remains pending. ? ?[employer], LCSW ?Transition of Care ?[phone] ? ? ? ?Expected Discharge Plan: [place] ?Barriers to Discharge: Insurance Authorization ? ?Expected Discharge Plan and Services ?Expected Discharge Plan: [place] ?In-house Referral: Clinical Social Work ?  ?Post Acute Care Choice: [place] ?Living arrangements for the past 2 months: [place] ?                ?  ?  ?  ?  ?  ?  ?  ?  ?  ?  ? ? ?Social Determinants of Health (SDOH) Interventions ?  ? ?Readmission Risk Interventions ?   ? View : No data to display.  ?  ?  ?  ? ? ?

## 2021-08-23 ENCOUNTER — Telehealth: Payer: Self-pay

## 2021-08-23 NOTE — Telephone Encounter (Signed)
Transition Care Management Follow-up Telephone Call ?Date of discharge and from where: [facility] 3/36/23 ?How have you been since you were released from the hospital? OK ?Any questions or concerns? No ? ?Items Reviewed: ?Did the pt receive and understand the discharge instructions provided? Yes  ?Medications obtained and verified? Yes  ?Other? No  ?Any new allergies since your discharge? No  ?Dietary orders reviewed? No ?Do you have support at home? No  ? ?Home Care and Equipment/Supplies: ?Were home health services ordered? no ?Functional Questionnaire: (I = Independent and D = Dependent) ?ADLs: I ? ?Bathing/Dressing- I ? ?Meal Prep- I ? ?Eating- I ? ?Maintaining continence- I ? ?Transferring/Ambulation- I ? ?Managing Meds- I ? ?Follow up appointments reviewed: ? ?PCP Hospital f/u appt confirmed? Yes  Scheduled to see Dr.Karamalegos on 4/8 @ 8:40. ?Are transportation arrangements needed? No  ?If their condition worsens, is the pt aware to call PCP or go to the Emergency Dept.? Yes ?Was the patient provided with contact information for the PCP's office or ED? Yes ?Was to pt encouraged to call back with questions or concerns? Yes  ?

## 2021-08-24 ENCOUNTER — Telehealth: Payer: Self-pay

## 2021-08-24 NOTE — Telephone Encounter (Signed)
NA at home ?

## 2021-09-02 ENCOUNTER — Ambulatory Visit: Payer: Commercial Managed Care - PPO | Admitting: Family Medicine

## 2021-09-02 ENCOUNTER — Encounter: Payer: Self-pay | Admitting: Family Medicine

## 2021-09-02 VITALS — BP 120/68 | HR 92 | Ht 67.0 in | Wt 149.8 lb

## 2021-09-02 DIAGNOSIS — R270 Ataxia, unspecified: Secondary | ICD-10-CM

## 2021-09-02 DIAGNOSIS — F10939 Alcohol use, unspecified with withdrawal, unspecified: Secondary | ICD-10-CM

## 2021-09-02 DIAGNOSIS — E512 Wernicke's encephalopathy: Secondary | ICD-10-CM

## 2021-09-02 NOTE — Progress Notes (Signed)
? ?Subjective:  ? ? Patient ID: Luis Porter, male    DOB: 01/07/1961, 61 y.o.   MRN: [id number] ? ?Luis Porter is a 61 y.o. male presenting on 09/02/2021 for Hospitalization Follow-up ? ? ?HPI ? ?HOSPITAL FOLLOW-UP VISIT ? ?Hospital/Location: [facility] ?Date of Admission: 08/13/21 ?Date of Discharge: 08/22/21 ?Transitions of care telephone call: Completed by Dionisio David LPN on 0/92/33 ? ?Reason for Admission: alcohol intoxication with delirium, encephalopathy, recurrent falls/unbalanced ? ?- Hospital H&P and Discharge Summary have been reviewed ?- Patient presents today 11 days after recent hospitalization. Brief summary of recent course, patient had symptoms of recurrent falling, loss of balance coordination, confusion and delirium with alcohol intoxication, falls ataxia, hallucination, hospitalized, treated with nutrition IV detox, thiamine IV thought symptoms were consistent with Wernicke's encephalopathy and withdrawal delirium. Overall symptoms were improving with treatment, had improved ataxia, tremors, and no longer disorientation. ? ?Discharged with medication list updated below. Thiamine '100mg'$  daily ? ?For heart, also on Spironolactone '25mg'$  daily, Entresto ? ?He does not take the Trazodone '50mg'$  nightly. Does not have problem sleeping. ? ?- Today reports overall has done well after discharge. Symptoms of encephalopathy have resolved. Balance improved. ? ?He is still consuming alcohol, less often 1-2 beers slowly not every day. ? ?He has help and support at work. Not in treatment program. ? ?He has remained out of work and anticipates returning next week on Monday. ? ? ?I have reviewed the discharge medication list, and have reconciled the current and discharge medications today. ? ? ?Current Outpatient Medications:  ?  albuterol (VENTOLIN HFA) 108 (90 Base) MCG/ACT inhaler, INHALE 1-2 PUFFS INTO THE LUNGS EVERY 4 (FOUR) HOURS AS NEEDED FOR WHEEZING OR SHORTNESS OF BREATH., Disp: 6.7 each, Rfl:  1 ?  fluticasone (FLONASE) 50 MCG/ACT nasal spray, PLACE 2 SPRAYS INTO BOTH NOSTRILS DAILY. USE FOR 4-6 WEEKS THEN STOP AND USE SEASONALLY OR AS NEEDED. (Patient taking differently: Place 2 sprays into both nostrils daily as needed for allergies.), Disp: 48 mL, Rfl: 1 ?  folic acid (FOLVITE) 1 MG tablet, Take 1 tablet (1 mg total) by mouth daily., Disp: 90 tablet, Rfl: 3 ?  Glycerin-Hypromellose-PEG 400 (VISINE DRY EYE OP), Place 1 drop into both eyes daily as needed (dry eyes)., Disp: , Rfl:  ?  hydrOXYzine (ATARAX) 25 MG tablet, Take 1 tablet (25 mg total) by mouth every 6 (six) hours as needed for anxiety (or CIWA score </= 10)., Disp: 30 tablet, Rfl: 0 ?  loratadine (CLARITIN) 10 MG tablet, Take 10 mg by mouth daily as needed for allergies or rhinitis., Disp: , Rfl:  ?  metoprolol succinate (TOPROL-XL) 100 MG 24 hr tablet, Take 1 tablet (100 mg total) by mouth daily. Take with or immediately following a meal., Disp: 30 tablet, Rfl: 3 ?  multivitamin (ONE-A-DAY MEN'S) TABS tablet, Take 1 tablet by mouth daily with breakfast., Disp: , Rfl:  ?  sacubitril-valsartan (ENTRESTO) 24-26 MG, Take 1 tablet by mouth 2 (two) times daily., Disp: 180 tablet, Rfl: 2 ?  spironolactone (ALDACTONE) 25 MG tablet, Take 1 tablet (25 mg total) by mouth daily., Disp: 30 tablet, Rfl: 2 ?  thiamine 100 MG tablet, Take 1 tablet (100 mg total) by mouth daily., Disp: 90 tablet, Rfl: 3 ?  traZODone (DESYREL) 50 MG tablet, Take 1 tablet (50 mg total) by mouth at bedtime., Disp: 30 tablet, Rfl: 2 ? ?------------------------------------------------------------------------- ?Social History  ? ?Tobacco Use  ? Smoking status: Former  ?  Packs/day: 2.00  ?  Years: 25.00  ?  Pack years: 50.00  ?  Types: Cigarettes  ?  Quit date: 05/31/1987  ?  Years since quitting: 34.2  ? Smokeless tobacco: Never  ?Vaping Use  ? Vaping Use: Never used  ?Substance Use Topics  ? Alcohol use: Yes  ?  Alcohol/week: 14.0 standard drinks  ?  Types: 14 Cans of beer per  week  ?  Comment: former usage. States he drinks around 2 beer a day  ? Drug use: No  ? ? ?Review of Systems ?Per HPI unless specifically indicated above ? ?   ?Objective:  ?  ?BP 120/68   Pulse 92   Ht '5\' 7"'$  (1.702 m)   Wt 149 lb 12.8 oz (67.9 kg)   SpO2 98%   BMI 23.46 kg/m?   ?Wt Readings from Last 3 Encounters:  ?09/02/21 149 lb 12.8 oz (67.9 kg)  ?08/22/21 145 lb 4.5 oz (65.9 kg)  ?05/10/21 151 lb 3.2 oz (68.6 kg)  ?  ?Physical Exam ?Vitals and nursing note reviewed.  ?Constitutional:   ?   General: He is not in acute distress. ?   Appearance: He is well-developed. He is not diaphoretic.  ?   Comments: Well-appearing, comfortable, cooperative  ?HENT:  ?   Head: Normocephalic and atraumatic.  ?Eyes:  ?   General:     ?   Right eye: No discharge.     ?   Left eye: No discharge.  ?   Conjunctiva/sclera: Conjunctivae normal.  ?Neck:  ?   Thyroid: No thyromegaly.  ?Cardiovascular:  ?   Rate and Rhythm: Normal rate and regular rhythm.  ?   Pulses: Normal pulses.  ?   Heart sounds: Normal heart sounds. No murmur heard. ?Pulmonary:  ?   Effort: Pulmonary effort is normal. No respiratory distress.  ?   Breath sounds: Normal breath sounds. No wheezing or rales.  ?Musculoskeletal:     ?   General: Normal range of motion.  ?   Cervical back: Normal range of motion and neck supple.  ?Lymphadenopathy:  ?   Cervical: No cervical adenopathy.  ?Skin: ?   General: Skin is warm and dry.  ?   Findings: No erythema or rash.  ?Neurological:  ?   Mental Status: He is alert and oriented to person, place, and time. Mental status is at baseline.  ?Psychiatric:     ?   Behavior: Behavior normal.  ?   Comments: Well groomed, good eye contact, normal speech and thoughts  ? ? ? ? ?Results for orders placed or performed during the hospital encounter of 08/13/21  ?Resp Panel by RT-PCR (Flu A&B, Covid) Nasopharyngeal Swab  ? Specimen: Nasopharyngeal Swab; Nasopharyngeal(NP) swabs in vial transport medium  ?Result Value Ref Range  ? SARS  Coronavirus 2 by RT PCR NEGATIVE NEGATIVE  ? Influenza A by PCR NEGATIVE NEGATIVE  ? Influenza B by PCR NEGATIVE NEGATIVE  ?Comprehensive metabolic panel  ?Result Value Ref Range  ? Sodium 140 135 - 145 mmol/L  ? Potassium 3.7 3.5 - 5.1 mmol/L  ? Chloride 100 98 - 111 mmol/L  ? CO2 23 22 - 32 mmol/L  ? Glucose, Bld 76 70 - 99 mg/dL  ? BUN <5 (L) 6 - 20 mg/dL  ? Creatinine, Ser 0.63 0.61 - 1.24 mg/dL  ? Calcium 8.7 (L) 8.9 - 10.3 mg/dL  ? Total Protein 6.9 6.5 - 8.1 g/dL  ? Albumin 3.8 3.5 - 5.0 g/dL  ? AST 149 (  H) 15 - 41 U/L  ? ALT 61 (H) 0 - 44 U/L  ? Alkaline Phosphatase 59 38 - 126 U/L  ? Total Bilirubin 0.8 0.3 - 1.2 mg/dL  ? GFR, Estimated >60 >60 mL/min  ? Anion gap 17 (H) 5 - 15  ?CBC with Differential  ?Result Value Ref Range  ? WBC 5.5 4.0 - 10.5 K/uL  ? RBC 4.25 4.22 - 5.81 MIL/uL  ? Hemoglobin 15.3 13.0 - 17.0 g/dL  ? HCT 42.7 39.0 - 52.0 %  ? MCV 100.5 (H) 80.0 - 100.0 fL  ? MCH 36.0 (H) 26.0 - 34.0 pg  ? MCHC 35.8 30.0 - 36.0 g/dL  ? RDW 14.2 11.5 - 15.5 %  ? Platelets 157 150 - 400 K/uL  ? nRBC 0.0 0.0 - 0.2 %  ? Neutrophils Relative % 51 %  ? Neutro Abs 2.8 1.7 - 7.7 K/uL  ? Lymphocytes Relative 35 %  ? Lymphs Abs 1.9 0.7 - 4.0 K/uL  ? Monocytes Relative 10 %  ? Monocytes Absolute 0.5 0.1 - 1.0 K/uL  ? Eosinophils Relative 0 %  ? Eosinophils Absolute 0.0 0.0 - 0.5 K/uL  ? Basophils Relative 3 %  ? Basophils Absolute 0.2 (H) 0.0 - 0.1 K/uL  ? Immature Granulocytes 1 %  ? Abs Immature Granulocytes 0.03 0.00 - 0.07 K/uL  ?Brain natriuretic peptide  ?Result Value Ref Range  ? B Natriuretic Peptide 37.3 0.0 - 100.0 pg/mL  ?Urinalysis, Routine w reflex microscopic Urine, Clean Catch  ?Result Value Ref Range  ? Color, Urine YELLOW YELLOW  ? APPearance CLEAR CLEAR  ? Specific Gravity, Urine 1.005 1.005 - 1.030  ? pH 7.0 5.0 - 8.0  ? Glucose, UA NEGATIVE NEGATIVE mg/dL  ? Hgb urine dipstick SMALL (A) NEGATIVE  ? Bilirubin Urine NEGATIVE NEGATIVE  ? Ketones, ur 20 (A) NEGATIVE mg/dL  ? Protein, ur NEGATIVE  NEGATIVE mg/dL  ? Nitrite NEGATIVE NEGATIVE  ? Leukocytes,Ua NEGATIVE NEGATIVE  ? RBC / HPF 0-5 0 - 5 RBC/hpf  ? Bacteria, UA NONE SEEN NONE SEEN  ? Squamous Epithelial / LPF 0-5 0 - 5  ?Ethanol  ?Result Value Re

## 2021-09-02 NOTE — Patient Instructions (Addendum)
Thank you for coming to the office today. ? ?Hospital sent a Neurology referral. Stay tuned - it should be scheduled soon, if not then we can place a new referral. ? ?[facility]   ?Address: [address], [place], [place] [address] ?Hours: 8AM-5PM ?Phone: [phone] ? ?Please schedule a Follow-up Appointment to: Return if symptoms worsen or fail to improve. ? ?If you have any other questions or concerns, please feel free to call the office or send a message through [place]. You may also schedule an earlier appointment if necessary. ? ?Additionally, you may be receiving a survey about your experience at our office within a few days to 1 week by e-mail or mail. We value your feedback. ? ?Nobie Putnam, DO ?[place] ?

## 2021-09-03 ENCOUNTER — Encounter: Payer: Self-pay | Admitting: Family Medicine

## 2021-09-03 DIAGNOSIS — E512 Wernicke's encephalopathy: Secondary | ICD-10-CM

## 2021-09-03 DIAGNOSIS — F10939 Alcohol use, unspecified with withdrawal, unspecified: Secondary | ICD-10-CM

## 2021-09-06 ENCOUNTER — Encounter: Payer: Self-pay | Admitting: Oncology

## 2021-09-10 ENCOUNTER — Other Ambulatory Visit: Payer: Self-pay | Admitting: Family Medicine

## 2021-09-10 DIAGNOSIS — K449 Diaphragmatic hernia without obstruction or gangrene: Secondary | ICD-10-CM

## 2021-09-10 NOTE — Telephone Encounter (Signed)
Requested medication (s) are due for refill today: no ? ?Requested medication (s) are on the active medication list: no ? ?Last refill:  03/02/21 ? ?Future visit scheduled: no ? ?Notes to clinic: Unable to refill per protocol, Rx expired. ? ? ? ?  ?Requested Prescriptions  ?Pending Prescriptions Disp Refills  ? omeprazole (PRILOSEC) 40 MG capsule [Pharmacy Med Name: OMEPRAZOLE DR 40 MG CAPSULE] 180 capsule 1  ?  Sig: TAKE 1 CAPSULE BY MOUTH TWICE A DAY  ?  ? Gastroenterology: Proton Pump Inhibitors Passed - 09/10/2021  2:34 AM  ?  ?  Passed - Valid encounter within last 12 months  ?  Recent Outpatient Visits   ? ?      ? 1 week ago Alcohol withdrawal syndrome with complication ([place])  ? [place], DO  ? 4 months ago Alcohol withdrawal syndrome with complication [facility])  ? [place], DO  ? 7 months ago Alcohol withdrawal syndrome with complication [facility])  ? [place], DO  ? 7 months ago Dilated cardiomyopathy [facility])  ? [place], DO  ? 8 months ago Acute diverticulitis  ? [place], DO  ? ?  ?  ? ?  ?  ?  ? ? ?

## 2021-09-15 ENCOUNTER — Ambulatory Visit (INDEPENDENT_AMBULATORY_CARE_PROVIDER_SITE_OTHER): Payer: Commercial Managed Care - PPO | Admitting: Neurology

## 2021-09-15 ENCOUNTER — Encounter: Payer: Self-pay | Admitting: Neurology

## 2021-09-15 VITALS — BP 136/86 | HR 69 | Ht 67.0 in | Wt 155.0 lb

## 2021-09-15 DIAGNOSIS — E512 Wernicke's encephalopathy: Secondary | ICD-10-CM | POA: Diagnosis not present

## 2021-09-15 DIAGNOSIS — F1010 Alcohol abuse, uncomplicated: Secondary | ICD-10-CM

## 2021-09-15 DIAGNOSIS — F10939 Alcohol use, unspecified with withdrawal, unspecified: Secondary | ICD-10-CM | POA: Diagnosis not present

## 2021-09-15 DIAGNOSIS — R270 Ataxia, unspecified: Secondary | ICD-10-CM | POA: Diagnosis not present

## 2021-09-15 NOTE — Patient Instructions (Signed)
Continue current current medication, continue thiamine supplements ?Alcohol cessation ?Follow with your primary care doctor ?Return as needed ?

## 2021-09-15 NOTE — Progress Notes (Signed)
? ?[facility] ? ?PATIENT: Luis Porter ?DOB: 23-Jan-1961 ? ?REQUESTING CLINICIAN: Nobie Putnam * ?HISTORY FROM: Patient  ?REASON FOR VISIT: Encephalopathy/ Alcohol abuse  ? ? ?HISTORICAL ? ?CHIEF COMPLAINT:  ?Chief Complaint  ?Patient presents with  ? New Patient (Initial Visit)  ?  Rm 12. Alone. ?NP internal referral for Dementia, Wernicke's encephalopathy.  ? ? ?HISTORY OF PRESENT ILLNESS:  ?This is a 61 year old gentleman past medical history of chronic alcohol abuse, CHF who is presenting after being admitted to the hospital for memory loss, confusion, hallucinations, fall and ataxia.  Patient was diagnosed with Wernicke encephalopathy, treated with high-dose thiamine.  He was also treated for alcohol withdrawal syndrome.  His condition slowly improved and he was discharged home.  Patient reports since discharge he has been drinking alcohol but less than before.  He denies any falls since discharge from the hospital but still having gait problems.  He reported he has not follow-up to get help regarding his alcohol abuse. ? ? ? ?OTHER MEDICAL CONDITIONS: Alcohol abuse, CHF ? ? ?REVIEW OF SYSTEMS: Full 14 system review of systems performed and negative with exception of: As noted in the HPI ? ?ALLERGIES: ?Allergies  ?Allergen Reactions  ? Benazepril Swelling and Other (See Comments)  ?  Angioedema  ? Naltrexone Other (See Comments)  ?  Made the patient "feel angry"  ? Nsaids Other (See Comments)  ?  Due to history of ulcers, should not have this class of medication ?  ? ? ?HOME MEDICATIONS: ?Outpatient Medications Prior to Visit  ?Medication Sig Dispense Refill  ? albuterol (VENTOLIN HFA) 108 (90 Base) MCG/ACT inhaler INHALE 1-2 PUFFS INTO THE LUNGS EVERY 4 (FOUR) HOURS AS NEEDED FOR WHEEZING OR SHORTNESS OF BREATH. 6.7 each 1  ? fluticasone (FLONASE) 50 MCG/ACT nasal spray PLACE 2 SPRAYS INTO BOTH NOSTRILS DAILY. USE FOR 4-6 WEEKS THEN STOP AND USE SEASONALLY OR AS NEEDED.  (Patient taking differently: Place 2 sprays into both nostrils daily as needed for allergies.) 48 mL 1  ? folic acid (FOLVITE) 1 MG tablet Take 1 tablet (1 mg total) by mouth daily. 90 tablet 3  ? Glycerin-Hypromellose-PEG 400 (VISINE DRY EYE OP) Place 1 drop into both eyes daily as needed (dry eyes).    ? hydrOXYzine (ATARAX) 25 MG tablet Take 1 tablet (25 mg total) by mouth every 6 (six) hours as needed for anxiety (or CIWA score </= 10). 30 tablet 0  ? loratadine (CLARITIN) 10 MG tablet Take 10 mg by mouth daily as needed for allergies or rhinitis.    ? metoprolol succinate (TOPROL-XL) 100 MG 24 hr tablet Take 1 tablet (100 mg total) by mouth daily. Take with or immediately following a meal. 30 tablet 3  ? multivitamin (ONE-A-DAY MEN'S) TABS tablet Take 1 tablet by mouth daily with breakfast.    ? omeprazole (PRILOSEC) 40 MG capsule TAKE 1 CAPSULE BY MOUTH TWICE A DAY 180 capsule 1  ? sacubitril-valsartan (ENTRESTO) 24-26 MG Take 1 tablet by mouth 2 (two) times daily. 180 tablet 2  ? spironolactone (ALDACTONE) 25 MG tablet Take 1 tablet (25 mg total) by mouth daily. 30 tablet 2  ? thiamine 100 MG tablet Take 1 tablet (100 mg total) by mouth daily. 90 tablet 3  ? traZODone (DESYREL) 50 MG tablet Take 1 tablet (50 mg total) by mouth at bedtime. 30 tablet 2  ? ?No facility-administered medications prior to visit.  ? ? ?PAST MEDICAL HISTORY: ?Past Medical History:  ?Diagnosis Date  ?  Chronic systolic heart failure ([place])   ? felt to be alcohol-related  ? Diverticulosis   ? Gastric ulcer   ? GERD (gastroesophageal reflux disease)   ? History of hiatal hernia   ? Hypertension   ? Iron deficiency anemia 10/18/2019  ? Recurrent umbilical hernia with incarceration 11/08/2016  ? Previously repaired x 1  ? ? ?PAST SURGICAL HISTORY: ?Past Surgical History:  ?Procedure Laterality Date  ? BACK SURGERY    ? COLONOSCOPY WITH PROPOFOL N/A 12/30/2016  ? Procedure: COLONOSCOPY WITH PROPOFOL;  Surgeon: [name], MD;  Location: [facility]  ENDOSCOPY;  Service: Endoscopy;  Laterality: N/A;  ? COLONOSCOPY WITH PROPOFOL N/A 10/31/2019  ? Procedure: COLONOSCOPY WITH PROPOFOL;  Surgeon: Lin Landsman, MD;  Location: [facility] ENDOSCOPY;  Service: Gastroenterology;  Laterality: N/A;  ? ESOPHAGOGASTRODUODENOSCOPY (EGD) WITH PROPOFOL N/A 12/30/2016  ? Procedure: ESOPHAGOGASTRODUODENOSCOPY (EGD) WITH PROPOFOL;  Surgeon: [name], MD;  Location: [facility] ENDOSCOPY;  Service: Endoscopy;  Laterality: N/A;  ? ESOPHAGOGASTRODUODENOSCOPY (EGD) WITH PROPOFOL N/A 10/31/2019  ? Procedure: ESOPHAGOGASTRODUODENOSCOPY (EGD) WITH PROPOFOL;  Surgeon: Lin Landsman, MD;  Location: [facility] ENDOSCOPY;  Service: Gastroenterology;  Laterality: N/A;  ? HERNIA REPAIR  2002  ? Umbilical Hernia Repair  ? LUMBAR LAMINECTOMY/DECOMPRESSION MICRODISCECTOMY Right 05/18/2016  ? Procedure: right L4-5 microdiscectomy;  Surgeon: Blanche East, MD;  Location: [facility] ORS;  Service: Neurosurgery;  Laterality: Right;  ? RIGHT/LEFT HEART CATH AND CORONARY ANGIOGRAPHY N/A 01/20/2021  ? Procedure: RIGHT/LEFT HEART CATH AND CORONARY ANGIOGRAPHY;  Surgeon: Troy Sine, MD;  Location: [place] [facility] LAB;  Service: Cardiovascular;  Laterality: N/A;  ? TONSILLECTOMY    ? UMBILICAL HERNIA REPAIR N/A 06/29/2018  ? Procedure: LAPAROSCOPIC REPAIR OF RECURRENT UMBILICAL HERNIA WITH MESH;  Surgeon: Vickie Epley, MD;  Location: [facility] ORS;  Service: General;  Laterality: N/A;  ? ? ?FAMILY HISTORY: ?Family History  ?Problem Relation Age of Onset  ? Hypertension Mother   ? Cancer Mother   ?     Ovarian  ? Cervical cancer Mother   ? Hypertension Father   ? Cancer Father   ?     tongue  ? Throat cancer Father   ? Hypertension Brother   ? Prostate cancer Neg Hx   ? Breast cancer Neg Hx   ? Colon cancer Neg Hx   ? Heart attack Neg Hx   ? Stroke Neg Hx   ? ? ?SOCIAL HISTORY: ?Social History  ? ?Socioeconomic History  ? Marital status: Divorced  ?  Spouse name: Not on file  ? Number of children: Not on file  ?  Years of education: Not on file  ? Highest education level: Not on file  ?Occupational History  ? Not on file  ?Tobacco Use  ? Smoking status: Former  ?  Packs/day: 2.00  ?  Years: 25.00  ?  Pack years: 50.00  ?  Types: Cigarettes  ?  Quit date: 05/31/1987  ?  Years since quitting: 34.3  ? Smokeless tobacco: Never  ?Vaping Use  ? Vaping Use: Never used  ?Substance and Sexual Activity  ? Alcohol use: Yes  ?  Alcohol/week: 14.0 standard drinks  ?  Types: 14 Cans of beer per week  ?  Comment: former usage. States he drinks around 2 beer a day  ? Drug use: No  ? Sexual activity: Yes  ?  Comment: parners birth control  ?Other Topics Concern  ? Not on file  ?Social History Narrative  ? Not on file  ? ?  Social Determinants of Health  ? ?Financial Resource Strain: Low Risk   ? Difficulty of Paying Living Expenses: Not very hard  ?Food Insecurity: No Food Insecurity  ? Worried About Charity fundraiser in the Last Year: Never true  ? Ran Out of Food in the Last Year: Never true  ?Transportation Needs: No Transportation Needs  ? Lack of Transportation (Medical): No  ? Lack of Transportation (Non-Medical): No  ?Physical Activity: Not on file  ?Stress: No Stress Concern Present  ? Feeling of Stress : Only a little  ?Social Connections: Unknown  ? Frequency of Communication with Friends and Family: Three times a week  ? Frequency of Social Gatherings with Friends and Family: Three times a week  ? Attends Religious Services: 1 to 4 times per year  ? Active Member of Clubs or Organizations: No  ? Attends Archivist Meetings: Never  ? Marital Status: Not on file  ?Intimate Partner Violence: Not At Risk  ? Fear of Current or Ex-Partner: No  ? Emotionally Abused: No  ? Physically Abused: No  ? Sexually Abused: No  ? ? ?PHYSICAL EXAM ? ?GENERAL EXAM/CONSTITUTIONAL: ?Vitals:  ?Vitals:  ? 09/15/21 0755  ?BP: 136/86  ?Pulse: 69  ?Weight: 155 lb (70.3 kg)  ?Height: '5\' 7"'$  (1.702 m)  ? ?Body mass index is 24.28 kg/m?. ?Wt Readings  from Last 3 Encounters:  ?09/15/21 155 lb (70.3 kg)  ?09/02/21 149 lb 12.8 oz (67.9 kg)  ?08/22/21 145 lb 4.5 oz (65.9 kg)  ? ?Patient is in no distress; well developed, nourished and groomed; neck is su

## 2021-09-23 ENCOUNTER — Encounter: Payer: Self-pay | Admitting: Oncology

## 2021-11-03 ENCOUNTER — Observation Stay (HOSPITAL_COMMUNITY)
Admission: EM | Admit: 2021-11-03 | Discharge: 2021-11-05 | Disposition: A | Discharge status: Home / Self Care | Payer: Commercial Managed Care - PPO | Attending: Family Medicine | Admitting: Family Medicine

## 2021-11-03 ENCOUNTER — Encounter (HOSPITAL_COMMUNITY): Payer: Self-pay | Admitting: Emergency Medicine

## 2021-11-03 ENCOUNTER — Emergency Department (HOSPITAL_COMMUNITY): Payer: Commercial Managed Care - PPO

## 2021-11-03 ENCOUNTER — Other Ambulatory Visit: Payer: Self-pay

## 2021-11-03 DIAGNOSIS — F1010 Alcohol abuse, uncomplicated: Secondary | ICD-10-CM | POA: Diagnosis not present

## 2021-11-03 DIAGNOSIS — R110 Nausea: Secondary | ICD-10-CM | POA: Diagnosis not present

## 2021-11-03 DIAGNOSIS — I5042 Chronic combined systolic (congestive) and diastolic (congestive) heart failure: Secondary | ICD-10-CM | POA: Diagnosis not present

## 2021-11-03 DIAGNOSIS — Z79899 Other long term (current) drug therapy: Secondary | ICD-10-CM | POA: Diagnosis not present

## 2021-11-03 DIAGNOSIS — I110 Hypertensive heart disease with heart failure: Secondary | ICD-10-CM | POA: Diagnosis not present

## 2021-11-03 DIAGNOSIS — R55 Syncope and collapse: Secondary | ICD-10-CM | POA: Diagnosis not present

## 2021-11-03 DIAGNOSIS — I5020 Unspecified systolic (congestive) heart failure: Secondary | ICD-10-CM | POA: Diagnosis present

## 2021-11-03 DIAGNOSIS — K219 Gastro-esophageal reflux disease without esophagitis: Secondary | ICD-10-CM | POA: Diagnosis present

## 2021-11-03 DIAGNOSIS — R059 Cough, unspecified: Secondary | ICD-10-CM | POA: Diagnosis not present

## 2021-11-03 DIAGNOSIS — Z87891 Personal history of nicotine dependence: Secondary | ICD-10-CM | POA: Diagnosis not present

## 2021-11-03 DIAGNOSIS — Y900 Blood alcohol level of less than 20 mg/100 ml: Secondary | ICD-10-CM | POA: Insufficient documentation

## 2021-11-03 DIAGNOSIS — R531 Weakness: Secondary | ICD-10-CM | POA: Diagnosis not present

## 2021-11-03 DIAGNOSIS — Z789 Other specified health status: Secondary | ICD-10-CM

## 2021-11-03 DIAGNOSIS — E871 Hypo-osmolality and hyponatremia: Principal | ICD-10-CM | POA: Insufficient documentation

## 2021-11-03 DIAGNOSIS — R42 Dizziness and giddiness: Secondary | ICD-10-CM | POA: Diagnosis present

## 2021-11-03 DIAGNOSIS — I10 Essential (primary) hypertension: Secondary | ICD-10-CM | POA: Diagnosis present

## 2021-11-03 HISTORY — DX: Heart failure, unspecified: I50.9

## 2021-11-03 IMAGING — CR DG CHEST 2V
2 series · 2 of 2 positions shown · non-contrast
Comparison: 08/13/2021

CLINICAL DATA: History of CHF

EXAM:
CHEST - 2 VIEW

[chest lat]
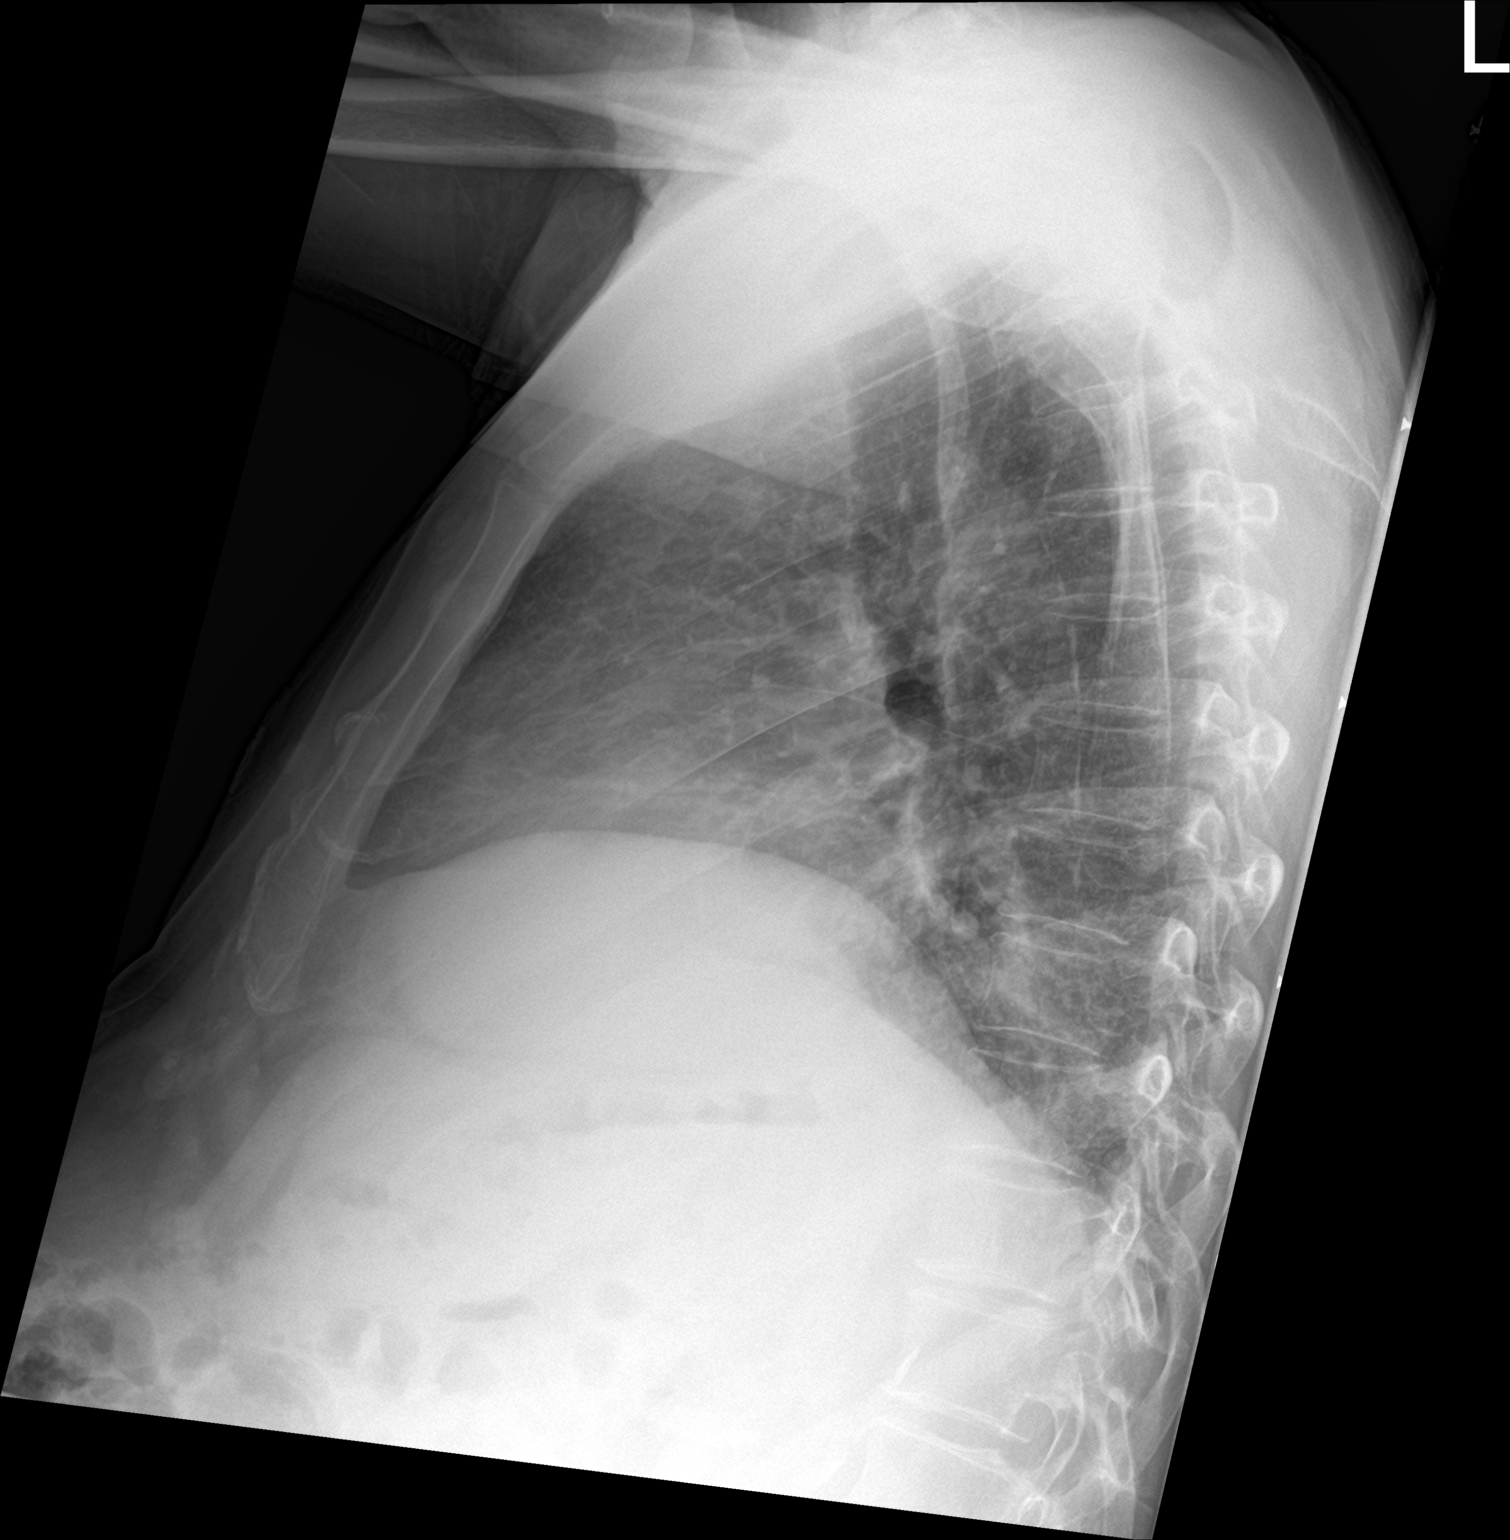

[chest ap]
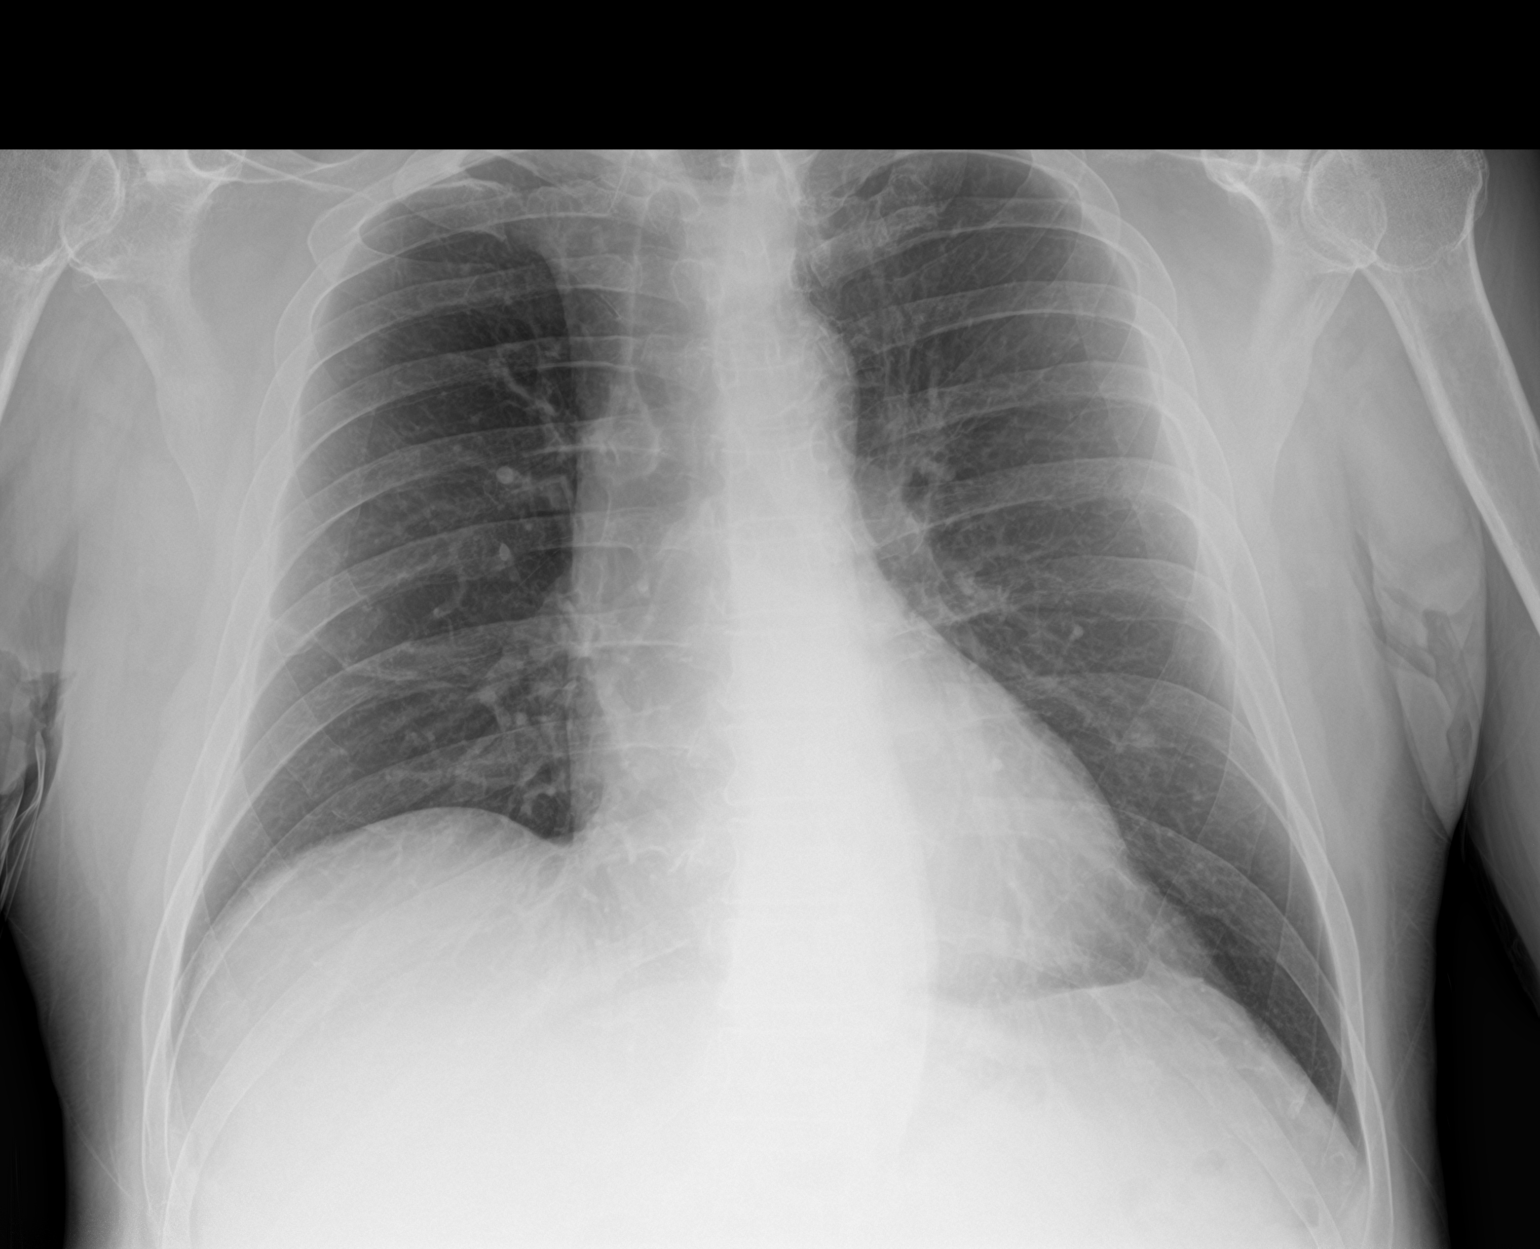

[2 of 2 positions shown; findings below may reference images not displayed]

FINDINGS: The heart size and mediastinal contours are within normal limits.
Aortic atherosclerosis. Both lungs are clear. The visualized
skeletal structures are unremarkable.
IMPRESSION: No active cardiopulmonary disease.

## 2021-11-03 NOTE — ED Provider Triage Note (Signed)
Emergency Medicine Provider Triage Evaluation Note  Luis Porter , a 61 y.o. male  was evaluated in triage.  Pt complains of lightheadedness, near syncopal episodes occurring today.  Also complains of nausea.  States since discharge from the hospital he has been relatively doing well going about his activities of daily living, going to work.  He states his symptoms returned today.  Does have history of alcohol abuse, Warnicke's encephalopathy, CHF.  Reports compliance with his medications.  Sleeps in a recliner at baseline.  Review of Systems  Positive: As above Negative: As above  Physical Exam  BP 125/87 (BP Location: Right Arm)   Pulse 75   Temp 98.2 F (36.8 C)   Resp 17   SpO2 94%  Gen:   Awake, no distress   Resp:  Normal effort  MSK:   Moves extremities without difficulty  Other:    Medical Decision Making  Medically screening exam initiated at 11:43 PM.  Appropriate orders placed.  Darrel Hoover Joy was informed that the remainder of the evaluation will be completed by another provider, this initial triage assessment does not replace that evaluation, and the importance of remaining in the ED until their evaluation is complete.     Evlyn Courier, PA-C 11/03/21 2344

## 2021-11-03 NOTE — ED Triage Notes (Signed)
Patient arrived with EMS from home reports nausea and dizziness/lightheaded this evening , received IV Zofran '4mg'$  by EMS with relief.

## 2021-11-04 DIAGNOSIS — R051 Acute cough: Secondary | ICD-10-CM

## 2021-11-04 DIAGNOSIS — K219 Gastro-esophageal reflux disease without esophagitis: Secondary | ICD-10-CM

## 2021-11-04 DIAGNOSIS — I5020 Unspecified systolic (congestive) heart failure: Secondary | ICD-10-CM | POA: Diagnosis present

## 2021-11-04 DIAGNOSIS — F1010 Alcohol abuse, uncomplicated: Secondary | ICD-10-CM

## 2021-11-04 DIAGNOSIS — E871 Hypo-osmolality and hyponatremia: Secondary | ICD-10-CM | POA: Diagnosis not present

## 2021-11-04 DIAGNOSIS — I10 Essential (primary) hypertension: Secondary | ICD-10-CM

## 2021-11-04 DIAGNOSIS — R55 Syncope and collapse: Secondary | ICD-10-CM | POA: Diagnosis not present

## 2021-11-04 LAB — COMPREHENSIVE METABOLIC PANEL
ALT: 11 U/L (ref 0–44)
AST: 19 U/L (ref 15–41)
Albumin: 3.7 g/dL (ref 3.5–5.0)
Alkaline Phosphatase: 43 U/L (ref 38–126)
Anion gap: 10 (ref 5–15)
BUN: <5 mg/dL — ABNORMAL LOW (ref 6–20)
CO2: 24 mmol/L (ref 22–32)
Calcium: 9.2 mg/dL (ref 8.9–10.3)
Chloride: 92 mmol/L — ABNORMAL LOW (ref 98–111)
Creatinine, Ser: 0.55 mg/dL — ABNORMAL LOW (ref 0.61–1.24)
GFR, Estimated: >60 mL/min (ref >60)
Glucose, Bld: 103 mg/dL — ABNORMAL HIGH (ref 70–99)
Potassium: 3.8 mmol/L (ref 3.5–5.1)
Sodium: 126 mmol/L — ABNORMAL LOW (ref 135–145)
Total Bilirubin: 0.7 mg/dL (ref 0.3–1.2)
Total Protein: 6.8 g/dL (ref 6.5–8.1)

## 2021-11-04 LAB — BASIC METABOLIC PANEL
Anion gap: 10 (ref 5–15)
BUN: 6 mg/dL (ref 6–20)
CO2: 24 mmol/L (ref 22–32)
Calcium: 9 mg/dL (ref 8.9–10.3)
Chloride: 98 mmol/L (ref 98–111)
Creatinine, Ser: 0.72 mg/dL (ref 0.61–1.24)
GFR, Estimated: >60 mL/min (ref >60)
Glucose, Bld: 110 mg/dL — ABNORMAL HIGH (ref 70–99)
Potassium: 4.1 mmol/L (ref 3.5–5.1)
Sodium: 132 mmol/L — ABNORMAL LOW (ref 135–145)

## 2021-11-04 LAB — CBC WITH DIFFERENTIAL/PLATELET
Abs Immature Granulocytes: 0.05 10*3/uL (ref 0.00–0.07)
Basophils Absolute: 0.1 10*3/uL (ref 0.0–0.1)
Basophils Relative: 1 %
Eosinophils Absolute: 0.4 10*3/uL (ref 0.0–0.5)
Eosinophils Relative: 4 %
HCT: 40.1 % (ref 39.0–52.0)
Hemoglobin: 14.3 g/dL (ref 13.0–17.0)
Immature Granulocytes: 1 %
Lymphocytes Relative: 32 %
Lymphs Abs: 3.3 10*3/uL (ref 0.7–4.0)
MCH: 34.1 pg — ABNORMAL HIGH (ref 26.0–34.0)
MCHC: 35.7 g/dL (ref 30.0–36.0)
MCV: 95.7 fL (ref 80.0–100.0)
Monocytes Absolute: 1.1 10*3/uL — ABNORMAL HIGH (ref 0.1–1.0)
Monocytes Relative: 11 %
Neutro Abs: 5.4 10*3/uL (ref 1.7–7.7)
Neutrophils Relative %: 51 %
Platelets: 310 10*3/uL (ref 150–400)
RBC: 4.19 MIL/uL — ABNORMAL LOW (ref 4.22–5.81)
RDW: 11.9 % (ref 11.5–15.5)
WBC: 10.4 10*3/uL (ref 4.0–10.5)
nRBC: 0 % (ref 0.0–0.2)

## 2021-11-04 LAB — D-DIMER, QUANTITATIVE: D-Dimer, Quant: <0.27 ug/mL-FEU (ref 0.00–0.50)

## 2021-11-04 LAB — BRAIN NATRIURETIC PEPTIDE: B Natriuretic Peptide: 81.1 pg/mL (ref 0.0–100.0)

## 2021-11-04 LAB — MAGNESIUM: Magnesium: 1.9 mg/dL (ref 1.7–2.4)

## 2021-11-04 LAB — ETHANOL: Alcohol, Ethyl (B): <10 mg/dL (ref <10)

## 2021-11-04 LAB — TROPONIN I (HIGH SENSITIVITY)
Troponin I (High Sensitivity): 5 ng/L (ref <18)
Troponin I (High Sensitivity): 6 ng/L (ref <18)

## 2021-11-04 LAB — PHOSPHORUS: Phosphorus: 4 mg/dL (ref 2.5–4.6)

## 2021-11-04 MED ORDER — LORAZEPAM 2 MG/ML IJ SOLN: 0.0000 mg | Freq: Four times a day (QID) | INTRAMUSCULAR | Status: DC

## 2021-11-04 MED ORDER — MECLIZINE HCL 25 MG PO TABS: 25.0000 mg | ORAL_TABLET | Freq: Three times a day (TID) | ORAL | Status: DC | PRN

## 2021-11-04 MED ORDER — LORAZEPAM 2 MG/ML IJ SOLN: 0.0000 mg | Freq: Two times a day (BID) | INTRAMUSCULAR | Status: DC

## 2021-11-04 MED ORDER — PANTOPRAZOLE SODIUM 40 MG PO TBEC
40.0000 mg | DELAYED_RELEASE_TABLET | Freq: Every day | ORAL | Status: DC
  Administered 2021-11-04 – 2021-11-05 (×2): 40 mg via ORAL
  Filled 2021-11-04 (×2): qty 1

## 2021-11-04 MED ORDER — SODIUM CHLORIDE 0.9 % IV SOLN: Freq: Once | INTRAVENOUS | Status: AC

## 2021-11-04 MED ORDER — METOPROLOL SUCCINATE ER 100 MG PO TB24
100.0000 mg | ORAL_TABLET | Freq: Every day | ORAL | Status: DC
  Administered 2021-11-04 – 2021-11-05 (×2): 100 mg via ORAL
  Filled 2021-11-04: qty 4
  Filled 2021-11-04: qty 1

## 2021-11-04 MED ORDER — ALBUTEROL SULFATE (2.5 MG/3ML) 0.083% IN NEBU: 2.5000 mg | INHALATION_SOLUTION | Freq: Four times a day (QID) | RESPIRATORY_TRACT | Status: DC | PRN

## 2021-11-04 MED ORDER — THIAMINE HCL 100 MG/ML IJ SOLN: 100.0000 mg | Freq: Every day | INTRAMUSCULAR | Status: DC

## 2021-11-04 MED ORDER — TRAZODONE HCL 50 MG PO TABS
50.0000 mg | ORAL_TABLET | Freq: Every day | ORAL | Status: DC
  Administered 2021-11-04: 50 mg via ORAL
  Filled 2021-11-04: qty 1

## 2021-11-04 MED ORDER — LORAZEPAM 2 MG/ML IJ SOLN: 1.0000 mg | INTRAMUSCULAR | Status: DC | PRN

## 2021-11-04 MED ORDER — ACETAMINOPHEN 650 MG RE SUPP: 650.0000 mg | Freq: Four times a day (QID) | RECTAL | Status: DC | PRN

## 2021-11-04 MED ORDER — FOLIC ACID 1 MG PO TABS
1.0000 mg | ORAL_TABLET | Freq: Every day | ORAL | Status: DC
  Administered 2021-11-04 – 2021-11-05 (×2): 1 mg via ORAL
  Filled 2021-11-04 (×2): qty 1

## 2021-11-04 MED ORDER — FLUTICASONE PROPIONATE 50 MCG/ACT NA SUSP: 2.0000 | Freq: Every day | NASAL | Status: DC | PRN

## 2021-11-04 MED ORDER — HYDROXYZINE HCL 25 MG PO TABS: 25.0000 mg | ORAL_TABLET | Freq: Four times a day (QID) | ORAL | Status: DC | PRN

## 2021-11-04 MED ORDER — SACUBITRIL-VALSARTAN 24-26 MG PO TABS
1.0000 | ORAL_TABLET | Freq: Two times a day (BID) | ORAL | Status: DC
Start: 2021-11-04 — End: 2021-11-04

## 2021-11-04 MED ORDER — SODIUM CHLORIDE 0.9 % IV SOLN: INTRAVENOUS | Status: DC

## 2021-11-04 MED ORDER — ONDANSETRON HCL 4 MG PO TABS: 4.0000 mg | ORAL_TABLET | Freq: Four times a day (QID) | ORAL | Status: DC | PRN

## 2021-11-04 MED ORDER — SODIUM CHLORIDE 0.9% FLUSH
3.0000 mL | Freq: Two times a day (BID) | INTRAVENOUS | Status: DC
  Administered 2021-11-04 – 2021-11-05 (×2): 3 mL via INTRAVENOUS

## 2021-11-04 MED ORDER — ACETAMINOPHEN 325 MG PO TABS
650.0000 mg | ORAL_TABLET | Freq: Four times a day (QID) | ORAL | Status: DC | PRN
  Administered 2021-11-04: 650 mg via ORAL
  Filled 2021-11-04: qty 2

## 2021-11-04 MED ORDER — THIAMINE HCL 100 MG PO TABS
100.0000 mg | ORAL_TABLET | Freq: Every day | ORAL | Status: DC
  Administered 2021-11-04 – 2021-11-05 (×2): 100 mg via ORAL
  Filled 2021-11-04 (×2): qty 1

## 2021-11-04 MED ORDER — POLYVINYL ALCOHOL 1.4 % OP SOLN: 1.0000 [drp] | Freq: Every day | OPHTHALMIC | Status: DC | PRN

## 2021-11-04 MED ORDER — ONDANSETRON HCL 4 MG/2ML IJ SOLN: 4.0000 mg | Freq: Four times a day (QID) | INTRAMUSCULAR | Status: DC | PRN

## 2021-11-04 MED ORDER — LORAZEPAM 1 MG PO TABS
1.0000 mg | ORAL_TABLET | ORAL | Status: DC | PRN
  Administered 2021-11-04: 1 mg via ORAL
  Filled 2021-11-04: qty 1

## 2021-11-04 MED ORDER — ENOXAPARIN SODIUM 40 MG/0.4ML IJ SOSY
40.0000 mg | PREFILLED_SYRINGE | Freq: Every day | INTRAMUSCULAR | Status: DC
  Administered 2021-11-04 – 2021-11-05 (×2): 40 mg via SUBCUTANEOUS
  Filled 2021-11-04 (×2): qty 0.4

## 2021-11-04 MED ORDER — ADULT MULTIVITAMIN W/MINERALS CH
1.0000 | ORAL_TABLET | Freq: Every day | ORAL | Status: DC
  Administered 2021-11-04 – 2021-11-05 (×2): 1 via ORAL
  Filled 2021-11-04 (×2): qty 1

## 2021-11-04 MED ORDER — SODIUM CHLORIDE 0.9 % IV BOLUS
250.0000 mL | Freq: Once | INTRAVENOUS | Status: AC
  Administered 2021-11-04: 250 mL via INTRAVENOUS

## 2021-11-04 MED ORDER — LORATADINE 10 MG PO TABS: 10.0000 mg | ORAL_TABLET | Freq: Every day | ORAL | Status: DC | PRN

## 2021-11-04 NOTE — ED Notes (Signed)
ED TO INPATIENT HANDOFF REPORT   S Name/Age/Gender Luis Porter 61 y.o. male Room/Bed: H011C/H011C  Code Status   Code Status: Full Code  Home/SNF/Other Home Patient oriented to: self, place, time, and situation Is this baseline? Yes   Triage Complete: Triage complete  Chief Complaint Hyponatremia [E87.1]  Triage Note Patient arrived with EMS from home reports nausea and dizziness/lightheaded this evening , received IV Zofran '4mg'$  by EMS with relief.    Allergies Allergies  Allergen Reactions   Lotensin [Benazepril] Swelling and Other (See Comments)    Angioedema   Vivitrol [Naltrexone] Other (See Comments)    Made the patient "feel angry"   Nsaids Other (See Comments)    Due to history of ulcers, should not have this class of medication     Level of Care/Admitting Diagnosis ED Disposition     ED Disposition  Admit   Condition  --   [place]: [place] [[id number]]  Level of Care: Telemetry Cardiac [103]  May place patient in observation at [facility] or [facility] [place] if equivalent level of care is available:: No  Covid Evaluation: Asymptomatic - no recent exposure (last 10 days) testing not required  Diagnosis: Hyponatremia [198519]  Admitting Physician: Norval Morton [[id number]]  Attending Physician: Norval Morton [[id number]]          B Medical/Surgery History Past Medical History:  Diagnosis Date   CHF (congestive heart failure) ([place])    Chronic systolic heart failure ([place])    felt to be alcohol-related   Diverticulosis    Gastric ulcer    GERD (gastroesophageal reflux disease)    History of hiatal hernia    Hypertension    Iron deficiency anemia 10/18/2019   Recurrent umbilical hernia with incarceration 11/08/2016   Previously repaired x 1   Past Surgical History:  Procedure Laterality Date   BACK SURGERY     COLONOSCOPY WITH PROPOFOL N/A 12/30/2016   Procedure: COLONOSCOPY WITH PROPOFOL;  Surgeon:  [name], MD;  Location: [facility] ENDOSCOPY;  Service: Endoscopy;  Laterality: N/A;   COLONOSCOPY WITH PROPOFOL N/A 10/31/2019   Procedure: COLONOSCOPY WITH PROPOFOL;  Surgeon: Lin Landsman, MD;  Location: [facility] ENDOSCOPY;  Service: Gastroenterology;  Laterality: N/A;   ESOPHAGOGASTRODUODENOSCOPY (EGD) WITH PROPOFOL N/A 12/30/2016   Procedure: ESOPHAGOGASTRODUODENOSCOPY (EGD) WITH PROPOFOL;  Surgeon: [name], MD;  Location: [facility] ENDOSCOPY;  Service: Endoscopy;  Laterality: N/A;   ESOPHAGOGASTRODUODENOSCOPY (EGD) WITH PROPOFOL N/A 10/31/2019   Procedure: ESOPHAGOGASTRODUODENOSCOPY (EGD) WITH PROPOFOL;  Surgeon: Lin Landsman, MD;  Location: [facility] ENDOSCOPY;  Service: Gastroenterology;  Laterality: N/A;   HERNIA REPAIR  1601   Umbilical Hernia Repair   LUMBAR LAMINECTOMY/DECOMPRESSION MICRODISCECTOMY Right 05/18/2016   Procedure: right L4-5 microdiscectomy;  Surgeon: Blanche East, MD;  Location: [facility] ORS;  Service: Neurosurgery;  Laterality: Right;   RIGHT/LEFT HEART CATH AND CORONARY ANGIOGRAPHY N/A 01/20/2021   Procedure: RIGHT/LEFT HEART CATH AND CORONARY ANGIOGRAPHY;  Surgeon: Troy Sine, MD;  Location: [place] LAB;  Service: Cardiovascular;  Laterality: N/A;   TONSILLECTOMY     UMBILICAL HERNIA REPAIR N/A 06/29/2018   Procedure: LAPAROSCOPIC REPAIR OF RECURRENT UMBILICAL HERNIA WITH MESH;  Surgeon: Vickie Epley, MD;  Location: [facility] ORS;  Service: General;  Laterality: N/A;     A IV Location/Drains/Wounds Patient Lines/Drains/Airways Status     Active Line/Drains/Airways     Name Placement date Placement time Site Days   Peripheral IV 11/04/21 20 G Left;Posterior Hand 11/04/21  --  Hand  less than 1   Peripheral IV 11/04/21 20 G 1" Anterior;Left Forearm 11/04/21  --  Forearm  less than 1            Intake/Output Last 24 hours  Intake/Output Summary (Last 24 hours) at 11/04/2021 1558 Last data filed at 11/04/2021 1109 Gross per 24 hour  Intake 250 ml   Output --  Net 250 ml    Labs/Imaging Results for orders placed or performed during the hospital encounter of 11/03/21 (from the past 48 hour(s))  CBC with Differential     Status: Abnormal   Collection Time: 11/03/21 11:49 PM  Result Value Ref Range   WBC 10.4 4.0 - 10.5 K/uL   RBC 4.19 (L) 4.22 - 5.81 MIL/uL   Hemoglobin 14.3 13.0 - 17.0 g/dL   HCT 40.1 39.0 - 52.0 %   MCV 95.7 80.0 - 100.0 fL   MCH 34.1 (H) 26.0 - 34.0 pg   MCHC 35.7 30.0 - 36.0 g/dL   RDW 11.9 11.5 - 15.5 %   Platelets 310 150 - 400 K/uL   nRBC 0.0 0.0 - 0.2 %   Neutrophils Relative % 51 %   Neutro Abs 5.4 1.7 - 7.7 K/uL   Lymphocytes Relative 32 %   Lymphs Abs 3.3 0.7 - 4.0 K/uL   Monocytes Relative 11 %   Monocytes Absolute 1.1 (H) 0.1 - 1.0 K/uL   Eosinophils Relative 4 %   Eosinophils Absolute 0.4 0.0 - 0.5 K/uL   Basophils Relative 1 %   Basophils Absolute 0.1 0.0 - 0.1 K/uL   Immature Granulocytes 1 %   Abs Immature Granulocytes 0.05 0.00 - 0.07 K/uL    Comment: Performed at [facility], [address] [place], [place] [address]  Comprehensive metabolic panel     Status: Abnormal   Collection Time: 11/03/21 11:49 PM  Result Value Ref Range   Sodium 126 (L) 135 - 145 mmol/L   Potassium 3.8 3.5 - 5.1 mmol/L   Chloride 92 (L) 98 - 111 mmol/L   CO2 24 22 - 32 mmol/L   Glucose, Bld 103 (H) 70 - 99 mg/dL    Comment: Glucose reference range applies only to samples taken after fasting for at least 8 hours.   BUN <5 (L) 6 - 20 mg/dL   Creatinine, Ser 0.55 (L) 0.61 - 1.24 mg/dL   Calcium 9.2 8.9 - 10.3 mg/dL   Total Protein 6.8 6.5 - 8.1 g/dL   Albumin 3.7 3.5 - 5.0 g/dL   AST 19 15 - 41 U/L   ALT 11 0 - 44 U/L   Alkaline Phosphatase 43 38 - 126 U/L   Total Bilirubin 0.7 0.3 - 1.2 mg/dL   GFR, Estimated >60 >60 mL/min    Comment: (NOTE) Calculated using the CKD-EPI Creatinine Equation (2021)    Anion gap 10 5 - 15    Comment: Performed at [facility] [address]  [place], [place] [address]  Brain natriuretic peptide     Status: None   Collection Time: 11/03/21 11:49 PM  Result Value Ref Range   B Natriuretic Peptide 81.1 0.0 - 100.0 pg/mL    Comment: Performed at [facility] [address] [place], [place] [address]  Troponin I (High Sensitivity)     Status: None   Collection Time: 11/03/21 11:49 PM  Result Value Ref Range   Troponin I (High Sensitivity) 5 <18 ng/L    Comment: (NOTE)  Elevated high sensitivity troponin I (hsTnI) values and significant  changes across serial measurements may suggest ACS but many other  chronic and acute conditions are known to elevate hsTnI results.  Refer to the Links section for chest pain algorithms and additional  guidance. Performed at [facility], [place] [address] [place], [place] [address]   Troponin I (High Sensitivity)     Status: None   Collection Time: 11/04/21  2:47 AM  Result Value Ref Range   Troponin I (High Sensitivity) 6 <18 ng/L    Comment: (NOTE) Elevated high sensitivity troponin I (hsTnI) values and significant  changes across serial measurements may suggest ACS but many other  chronic and acute conditions are known to elevate hsTnI results.  Refer to the "Links" section for chest pain algorithms and additional  guidance. Performed at [facility], [place] [address], [place], [place] [address]    DG Chest 2 View  Result Date: 11/04/2021 CLINICAL DATA:  History of CHF EXAM: CHEST - 2 VIEW COMPARISON:  08/13/2021 FINDINGS: The heart size and mediastinal contours are within normal limits. Aortic atherosclerosis. Both lungs are clear. The visualized skeletal structures are unremarkable. IMPRESSION: No active cardiopulmonary disease. Electronically Signed   By: Donavan Foil M.D.   On: 11/04/2021 00:02    Pending Labs Unresulted Labs (From admission, onward)     Start     Ordered   11/05/21 2878  Basic metabolic panel  Tomorrow morning,   R        11/04/21 1022   11/04/21  6767  Basic metabolic panel  Once-Timed,   TIMED        11/04/21 1025   11/04/21 1020  Sodium, urine, random  Once,   R        11/04/21 1022   11/04/21 1020  Osmolality, urine  Once,   R        11/04/21 1022   11/04/21 1019  Ethanol  Once,   R        11/04/21 1022            Vitals/Pain Today's Vitals   11/04/21 1300 11/04/21 1315 11/04/21 1330 11/04/21 1345  BP: (!) 167/105 (!) 147/95 (!) 154/81 (!) 142/110  Pulse: 99 (!) 102 98 (!) 105  Resp: '19 18 15 18  '$ Temp:      TempSrc:      SpO2: 97% 99% 96% 96%  Weight:      PainSc:        Isolation Precautions No active isolations  Medications Medications  enoxaparin (LOVENOX) injection 40 mg (40 mg Subcutaneous Given 11/04/21 1124)  sodium chloride flush (NS) 0.9 % injection 3 mL (0 mLs Intravenous Hold 11/04/21 1109)  acetaminophen (TYLENOL) tablet 650 mg (has no administration in time range)    Or  acetaminophen (TYLENOL) suppository 650 mg (has no administration in time range)  albuterol (PROVENTIL) (2.5 MG/3ML) 0.083% nebulizer solution 2.5 mg (has no administration in time range)  ondansetron (ZOFRAN) tablet 4 mg (has no administration in time range)    Or  ondansetron (ZOFRAN) injection 4 mg (has no administration in time range)  LORazepam (ATIVAN) tablet 1-4 mg (1 mg Oral Given 11/04/21 1126)    Or  LORazepam (ATIVAN) injection 1-4 mg ( Intravenous See Alternative 11/04/21 1126)  thiamine tablet 100 mg (100 mg Oral Given 11/04/21 1123)    Or  thiamine (B-1) injection 100 mg ( Intravenous See Alternative 2/0/94 7096)  folic acid (FOLVITE) tablet  1 mg (1 mg Oral Given 11/04/21 1123)  multivitamin with minerals tablet 1 tablet (1 tablet Oral Given 11/04/21 1123)  LORazepam (ATIVAN) injection 0-4 mg (0 mg Intravenous Hold 11/04/21 1126)    Followed by  LORazepam (ATIVAN) injection 0-4 mg (has no administration in time range)  0.9 %  sodium chloride infusion ( Intravenous New Bag/Given 11/04/21 0921)  sodium chloride 0.9 % bolus 250  mL (0 mLs Intravenous Stopped 11/04/21 1109)    Mobility walks Low fall risk   Focused Assessments Cardiac Assessment Handoff:    No results found for: "CKTOTAL", "CKMB", "CKMBINDEX", "TROPONINI" No results found for: "DDIMER" Does the Patient currently have chest pain? No   , Neuro Assessment Handoff:  Swallow screen pass? Yes          Neuro Assessment:   Neuro Checks:      Last Documented NIHSS Modified Score:   Has TPA been given? Yes Temp: 97.5 F (36.4 C) (06/08 0546) BP: 142/110 (06/08 1345) Pulse Rate: 105 (06/08 1345) If patient is a Neuro Trauma and patient is going to OR before floor call report to [place] nurse: [phone] or [phone]  , Pulmonary Assessment Handoff:  Lung sounds:   O2 Device: Room Air      R Recommendations: See Admitting Provider Note

## 2021-11-04 NOTE — H&P (Signed)
History and Physical    Patient: Luis Porter KDX:[id number] DOB: April 10, 1961 DOA: 11/03/2021 DOS: the patient was seen and examined on 11/04/2021 PCP: Olin Hauser, DO  Patient coming from: Home via EMS  Chief Complaint:  Chief Complaint  Patient presents with   Dizzy / Nausea   HPI: Luis Porter is a 61 y.o. male with medical history significant of hypertension, systolic CHF last EF noted to be 35%, alcohol abuse, and recurrent umbilical hernia status postrepair who presents with complaints of dizziness and nausea.  Symptoms started 2 days ago while he was just sitting.  He notes that he felt lightheaded, sluggish/weak all over, broke out in a sweat, crampy chest discomfort, some mild shortness of breath, and was nauseous.  Denied having any vomiting, lower extremity swelling, calf pain, or prolonged immobilization.  Symptoms self resolved and come back for which he came to the hospital for further evaluation.  Patient notes that he has had a intermittent productive cough with whitish sputum production.  He had last been hospitalized in March of this year after admitted for alcohol intoxication with delirium and ataxia secondary to a suspect Wernicke's encephalopathy. Patient had been treated with high-dose thiamine without significant improvement.  Symptoms of ataxia and hallucinations seem to resolve slowly with time.  Since that admission patient had cut back some from drinking.  Previously reported drinking a 12 pack of beer and liquor per day on average, and now only drinking about 8 beers per day.  He drinks about 64 ounces of water per day on average or sometimes a little more.  Patient last drank alcohol prior to coming into the hospital.  Further review of prior records note patient admitted 01/2021 with reports of near syncope, and reported excessive fluid intake at that time.  Recommended to decrease amount of fluids, but was actually hypernatremic during that  admission.  Upon admission into the emergency department patient was noted to be afebrile with pulse 66-1 04, and all other vital signs maintained.  Labs significant for sodium 126, chloride 92, BUN less than 5, creatinine 0.55, and high-sensitivity troponins negative x2.  Chest x-ray did not note any acute abnormality.  Orthostatics were ordered but have not been obtained yet.  Patient has been given 250 mL IV fluid bolus and then placed on a rate of 50 mL/h.  Review of Systems: As mentioned in the history of present illness. All other systems reviewed and are negative. 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/08/2016   Previously repaired x 1   Past Surgical History:  Procedure Laterality Date   BACK SURGERY     COLONOSCOPY WITH PROPOFOL N/A 12/30/2016   Procedure: COLONOSCOPY WITH PROPOFOL;  Surgeon: [name], MD;  Location: [facility] ENDOSCOPY;  Service: Endoscopy;  Laterality: N/A;   COLONOSCOPY WITH PROPOFOL N/A 10/31/2019   Procedure: COLONOSCOPY WITH PROPOFOL;  Surgeon: Lin Landsman, MD;  Location: [facility] ENDOSCOPY;  Service: Gastroenterology;  Laterality: N/A;   ESOPHAGOGASTRODUODENOSCOPY (EGD) WITH PROPOFOL N/A 12/30/2016   Procedure: ESOPHAGOGASTRODUODENOSCOPY (EGD) WITH PROPOFOL;  Surgeon: [name], MD;  Location: [facility] ENDOSCOPY;  Service: Endoscopy;  Laterality: N/A;  ESOPHAGOGASTRODUODENOSCOPY (EGD) WITH PROPOFOL N/A 10/31/2019   Procedure: ESOPHAGOGASTRODUODENOSCOPY (EGD) WITH PROPOFOL;  Surgeon: Lin Landsman, MD;  Location: [place];  Service: Gastroenterology;  Laterality: N/A;   HERNIA REPAIR  3500   Umbilical Hernia Repair   LUMBAR  LAMINECTOMY/DECOMPRESSION MICRODISCECTOMY Right 05/18/2016   Procedure: right L4-5 microdiscectomy;  Surgeon: Blanche East, MD;  Location: [facility] ORS;  Service: Neurosurgery;  Laterality: Right;   RIGHT/LEFT HEART CATH AND CORONARY ANGIOGRAPHY N/A 01/20/2021   Procedure: RIGHT/LEFT HEART CATH AND CORONARY ANGIOGRAPHY;  Surgeon: Troy Sine, MD;  Location: [place] [facility];  Service: Cardiovascular;  Laterality: N/A;   TONSILLECTOMY     UMBILICAL HERNIA REPAIR N/A 06/29/2018   Procedure: LAPAROSCOPIC REPAIR OF RECURRENT UMBILICAL HERNIA WITH MESH;  Surgeon: Vickie Epley, MD;  Location: [facility] ORS;  Service: General;  Laterality: N/A;   Social History:  reports that he quit smoking about 34 years ago. His smoking use included cigarettes. He has a 50.00 pack-year smoking history. He has never used smokeless tobacco. He reports current alcohol use of about 14.0 standard drinks of alcohol per week. He reports that he does not use drugs.  Allergies  Allergen Reactions   Benazepril Swelling and Other (See Comments)    Angioedema   Naltrexone Other (See Comments)    Made the patient "feel angry"   Nsaids Other (See Comments)    Due to history of ulcers, should not have this class of medication     Family History  Problem Relation Age of Onset   Hypertension Mother    Cancer Mother        Ovarian   Cervical cancer Mother    Hypertension Father    Cancer Father        tongue   Throat cancer Father    Hypertension Brother    Prostate cancer Neg Hx    Breast cancer Neg Hx    Colon cancer Neg Hx    Heart attack Neg Hx    Stroke Neg Hx     Prior to Admission medications   Medication Sig Start Date End Date Taking? Authorizing Provider  albuterol (VENTOLIN HFA) 108 (90 Base) MCG/ACT inhaler INHALE 1-2 PUFFS INTO THE LUNGS EVERY 4 (FOUR) HOURS AS NEEDED FOR WHEEZING OR SHORTNESS OF BREATH. 04/11/20   Karamalegos, Alexander J, DO  fluticasone (FLONASE) 50 MCG/ACT nasal spray  PLACE 2 SPRAYS INTO BOTH NOSTRILS DAILY. USE FOR 4-6 WEEKS THEN STOP AND USE SEASONALLY OR AS NEEDED. Patient taking differently: Place 2 sprays into both nostrils daily as needed for allergies. 07/20/20   Karamalegos, [name], DO  folic acid (FOLVITE) 1 MG tablet Take 1 tablet (1 mg total) by mouth daily. 01/27/21   Karamalegos, [name], DO  Glycerin-Hypromellose-PEG 400 (VISINE DRY EYE OP) Place 1 drop into both eyes daily as needed (dry eyes).    [provider]  hydrOXYzine (ATARAX) 25 MG tablet Take 1 tablet (25 mg total) by mouth every 6 (six) hours as needed for anxiety (or CIWA score </= 10). 08/22/21   Danford, Suann Larry, MD  loratadine (CLARITIN) 10 MG tablet Take 10 mg by mouth daily as needed for allergies or rhinitis.    [provider]  metoprolol succinate (TOPROL-XL) 100 MG 24 hr tablet Take 1 tablet (100 mg total) by mouth daily. Take with or immediately following a meal. 08/22/21   Danford, Suann Larry, MD  multivitamin (ONE-A-DAY MEN'S) TABS tablet Take 1 tablet by mouth daily with breakfast.  [provider]  omeprazole (PRILOSEC) 40 MG capsule TAKE 1 CAPSULE BY MOUTH TWICE A DAY 09/10/21   Karamalegos, Alexander J, DO  sacubitril-valsartan (ENTRESTO) 24-26 MG Take 1 tablet by mouth 2 (two) times daily. 08/22/21   Danford, Suann Larry, MD  spironolactone (ALDACTONE) 25 MG tablet Take 1 tablet (25 mg total) by mouth daily. 08/22/21   Danford, Suann Larry, MD  thiamine 100 MG tablet Take 1 tablet (100 mg total) by mouth daily. 08/22/21   Danford, Suann Larry, MD  traZODone (DESYREL) 50 MG tablet Take 1 tablet (50 mg total) by mouth at bedtime. 08/22/21   Edwin Dada, MD    Physical Exam: Vitals:   11/04/21 0745 11/04/21 0915 11/04/21 0930 11/04/21 0945  BP: 101/86 (!) 115/91 132/81 134/75  Pulse: 68 (!) 104 78 73  Resp: '15 18 14 17  '$ Temp:      TempSrc:      SpO2: 100% 99% 99% 99%   Constitutional: Older male who appears to  be in no acute distress Eyes: PERRL, lids and conjunctivae normal ENMT: Mucous membranes are moist.   Neck: normal, supple, no masses, no thyromegaly Respiratory: clear to auscultation bilaterally, no wheezing, no crackles. Normal respiratory effort. No accessory muscle use.  Cardiovascular: Tachycardia, no murmurs / rubs / gallops. No extremity edema.   Abdomen: no tenderness, no masses palpated. Bowel sounds positive.  Musculoskeletal: no clubbing / cyanosis. No joint deformity upper and lower extremities. G  Skin: no rashes, lesions, ulcers. No induration Neurologic: CN 2-12 grossly intact.  Able to move all extremities with no focal weakness appreciated. Psychiatric: Normal judgment and insight. Alert and oriented x 3. Normal mood.   Data Reviewed:  Normal sinus rhythm at 77 bpm with incomplete RBBB and QTc 477  Assessment and Plan: Hyponatremia Acute.  On admission sodium 126.  Patient has been ordered normal saline IV fluids 250 mL bolus and then placed on rate of 50 mL/h.  Patient reports that he has been drinking 64 mL of water per day and sometimes more with his job.  Does not appear acutely volume overloaded but does have a history of heart failure. -Admit to a medical telemetry bed -Check urine sodium and urine osmolarity -Recheck BMP after initial fluids -Continue IV fluids and adjust as needed presented  Dizziness near syncopal Patient reports feeling as though he could pass out.  There were no initial orthostatic vital signs performed on this patient.  Patient previously noted to have ataxia related to his alcohol abuse.   -Check orthostatic vital signs -Check D-dimer -Held spironolactone and Entresto as noted as possible side effects of medication -Meclizine as needed for dizziness -PT/OT to eval and treat -Follow-up telemetry in case of possible arrhythmia given low heart function  Alcohol abuse Patient reports drinking 8 beers per day on average with his last beer  prior to coming into the hospital.  No initial alcohol level was obtained.  Patient appears to be at high risk for withdrawals. -Check alcohol level -CIWA protocols with scheduled Ativan -Thiamine, multivitamin, folate acid  Cough Patient reports having intermittent productive cough with whitish sputum production.  Chest x-ray otherwise clear.  Noted to be on the side of profile of Entresto. -Antitussives as needed  Heart failure with reduced EF Chronic.  Last EF noted to be around 35% in 03/2021.  He reports some crampy chest discomfort, but high-sensitivity troponins negative x2.   -Strict I&Os -Daily weights -Continue Entresto  Essential hypertension Blood pressures currently  stable. Home medication regimen includes Entresto, metoprolol 100 mg daily, and spironolactone 25 mg daily. -Hold spironolactone and Entresto due to symptoms.  May consider discussing with cardiology. -Continue metoprolol    GERD -Continue pharmacy substitution of Protonix  Advance Care Planning:   Code Status: Full Code    Consults: None  Family Communication: None requested  Severity of Illness: The appropriate patient status for this patient is OBSERVATION. Observation status is judged to be reasonable and necessary in order to provide the required intensity of service to ensure the patient's safety. The patient's presenting symptoms, physical exam findings, and initial radiographic and laboratory data in the context of their medical condition is felt to place them at decreased risk for further clinical deterioration. Furthermore, it is anticipated that the patient will be medically stable for discharge from the hospital within 2 midnights of admission.   Author: Norval Morton, MD 11/04/2021 9:57 AM  For on call review [email or web address].

## 2021-11-04 NOTE — Progress Notes (Signed)
Pt admitted to 6E AxOx4, VS wnL and as per flow. Pt oriented to 6E processes. Pt familiar with the [facility] system. All questions and concerns addressed. Call bell placed within reach, will continue to monitor and maintain safety.  ?

## 2021-11-04 NOTE — ED Provider Notes (Signed)
[facility] EMERGENCY DEPARTMENT Provider Note   CSN: [id number] Arrival date & time: 11/03/21  2323     History  Chief Complaint  Patient presents with   Dizzy / Nausea    Chasen Mendell Svehla is a 61 y.o. male.  HPI Aleczander Fandino Demir is a 61 y.o. male with medical history significant of hypertension, dilated cardiomyopathy, chronic combined systolic and diastolic CHF, and GERD presents due to weakness some lightheadedness fatigue nausea.  Seems the patient was drinking beers last night and began feeling quite weak he states that he felt lightheaded but on further questioning seems to describe this as generalized weakness.  He denies any focal weakness of any upper or lower extremity.  He denies any syncope near syncope or head injury.  He denies any recreational drug use denies any chest pain.  States he did have some mild shortness of breath but that seems to have improved now.  He was given 4 mg of IV Zofran by EMS with relief of his nausea and is not currently nauseous and he has not experienced any vomiting.  Last alcohol intake was 10 PM last night.  Seems that he drinks 8 beers a day.  He states this is a dramatic decrease from what he used to drink.  He states he no longer drinks liquor.  Was diagnosed with Warnicke's during prior admission 3 months ago.  He states that he is still taking his Entresto for his CHF.      Home Medications Prior to Admission medications   Medication Sig Start Date End Date Taking? Authorizing Provider  albuterol (VENTOLIN HFA) 108 (90 Base) MCG/ACT inhaler INHALE 1-2 PUFFS INTO THE LUNGS EVERY 4 (FOUR) HOURS AS NEEDED FOR WHEEZING OR SHORTNESS OF BREATH. 04/11/20   Karamalegos, Alexander J, DO  fluticasone (FLONASE) 50 MCG/ACT nasal spray PLACE 2 SPRAYS INTO BOTH NOSTRILS DAILY. USE FOR 4-6 WEEKS THEN STOP AND USE SEASONALLY OR AS NEEDED. Patient taking differently: Place 2 sprays into both nostrils daily as needed for  allergies. 07/20/20   Karamalegos, [name], DO  folic acid (FOLVITE) 1 MG tablet Take 1 tablet (1 mg total) by mouth daily. 01/27/21   Karamalegos, [name], DO  Glycerin-Hypromellose-PEG 400 (VISINE DRY EYE OP) Place 1 drop into both eyes daily as needed (dry eyes).    [provider]  hydrOXYzine (ATARAX) 25 MG tablet Take 1 tablet (25 mg total) by mouth every 6 (six) hours as needed for anxiety (or CIWA score </= 10). 08/22/21   Danford, Suann Larry, MD  loratadine (CLARITIN) 10 MG tablet Take 10 mg by mouth daily as needed for allergies or rhinitis.    [provider]  metoprolol succinate (TOPROL-XL) 100 MG 24 hr tablet Take 1 tablet (100 mg total) by mouth daily. Take with or immediately following a meal. 08/22/21   Danford, Suann Larry, MD  multivitamin (ONE-A-DAY MEN'S) TABS tablet Take 1 tablet by mouth daily with breakfast.    [provider]  omeprazole (PRILOSEC) 40 MG capsule TAKE 1 CAPSULE BY MOUTH TWICE A DAY 09/10/21   Karamalegos, Alexander J, DO  sacubitril-valsartan (ENTRESTO) 24-26 MG Take 1 tablet by mouth 2 (two) times daily. 08/22/21   Danford, Suann Larry, MD  spironolactone (ALDACTONE) 25 MG tablet Take 1 tablet (25 mg total) by mouth daily. 08/22/21   Danford, Suann Larry, MD  thiamine 100 MG tablet Take 1 tablet (100 mg total) by mouth daily. 08/22/21   Danford, Suann Larry, MD  traZODone (DESYREL) 50 MG tablet Take 1 tablet (50 mg total) by mouth at bedtime. 08/22/21   Danford, Suann Larry, MD      Allergies    Benazepril, Naltrexone, and Nsaids    Review of Systems   Review of Systems  Physical Exam Updated Vital Signs BP (!) 115/91   Pulse (!) 104   Temp (!) 97.5 F (36.4 C)   Resp 18   SpO2 99%  Physical Exam Vitals and nursing note reviewed.  Constitutional:      General: He is not in acute distress. HENT:     Head: Normocephalic and atraumatic.     Nose: Nose normal.     Mouth/Throat:     Mouth: Mucous membranes  are dry.  Eyes:     General: No scleral icterus. Cardiovascular:     Rate and Rhythm: Normal rate and regular rhythm.     Pulses: Normal pulses.     Heart sounds: Normal heart sounds.  Pulmonary:     Effort: Pulmonary effort is normal. No respiratory distress.     Breath sounds: No wheezing.  Abdominal:     Palpations: Abdomen is soft.     Tenderness: There is no abdominal tenderness. There is no guarding or rebound.  Musculoskeletal:     Cervical back: Normal range of motion.     Right lower leg: No edema.     Left lower leg: No edema.  Skin:    General: Skin is warm and dry.     Capillary Refill: Capillary refill takes less than 2 seconds.  Neurological:     Mental Status: He is alert. Mental status is at baseline.     Comments: Generalized weakness Bilateral grip strength 4/5, symmetric, able to lift bilateral lower extremities individually off the table.  Smile symmetric.  Alert and oriented x3.  Able answer questions appropriate follow commands.   Positive Romberg  Psychiatric:        Mood and Affect: Mood normal.        Behavior: Behavior normal.     ED Results / Procedures / Treatments   Labs (all labs ordered are listed, but only abnormal results are displayed) Labs Reviewed  CBC WITH DIFFERENTIAL/PLATELET - Abnormal; Notable for the following components:      Result Value   RBC 4.19 (*)    MCH 34.1 (*)    Monocytes Absolute 1.1 (*)    All other components within normal limits  COMPREHENSIVE METABOLIC PANEL - Abnormal; Notable for the following components:   Sodium 126 (*)    Chloride 92 (*)    Glucose, Bld 103 (*)    BUN <5 (*)    Creatinine, Ser 0.55 (*)    All other components within normal limits  BRAIN NATRIURETIC PEPTIDE  TROPONIN I (HIGH SENSITIVITY)  TROPONIN I (HIGH SENSITIVITY)    EKG EKG Interpretation  Date/Time:  Wednesday November 03 2021 23:30:46 EDT Ventricular Rate:  77 PR Interval:  202 QRS Duration: 94 QT Interval:  416 QTC  Calculation: 470 R Axis:   3 Text Interpretation: Normal sinus rhythm Incomplete right bundle branch block Minimal voltage criteria for LVH, may be normal variant ( R in aVL ) Cannot rule out Anterior infarct , age undetermined Abnormal ECG When compared with ECG of 14-Aug-2021 07:46, PREVIOUS ECG IS PRESENT T wave inversions improved Confirmed by Ezequiel Essex [phone]) on 11/04/2021 7:24:41 AM  Radiology DG Chest 2 View  Result Date: 11/04/2021 CLINICAL DATA:  History of CHF  EXAM: CHEST - 2 VIEW COMPARISON:  08/13/2021 FINDINGS: The heart size and mediastinal contours are within normal limits. Aortic atherosclerosis. Both lungs are clear. The visualized skeletal structures are unremarkable. IMPRESSION: No active cardiopulmonary disease. Electronically Signed   By: Donavan Foil M.D.   On: 11/04/2021 00:02    Procedures .Critical Care  Performed by: Tedd Sias, PA Authorized by: Tedd Sias, PA   Critical care provider statement:    Critical care time (minutes):  35   Critical care time was exclusive of:  Separately billable procedures and treating other patients and teaching time   Critical care was necessary to treat or prevent imminent or life-threatening deterioration of the following conditions: severe electrolyte abn with symptoms.   Critical care was time spent personally by me on the following activities:  Development of treatment plan with patient or surrogate, review of old charts, re-evaluation of patient's condition, pulse oximetry, ordering and review of radiographic studies, ordering and review of laboratory studies, ordering and performing treatments and interventions, obtaining history from patient or surrogate, examination of patient and evaluation of patient's response to treatment   Care discussed with: admitting provider       Medications Ordered in ED Medications  0.9 %  sodium chloride infusion ( Intravenous New Bag/Given 11/04/21 0921)  sodium chloride 0.9 %  bolus 250 mL (250 mLs Intravenous New Bag/Given 11/04/21 5361)    ED Course/ Medical Decision Making/ A&P                           Medical Decision Making Risk Prescription drug management.   This patient presents to the ED for concern of fatigue and weakness, this involves a number of treatment options, and is a complaint that carries with it a high risk of complications and morbidity.  The differential diagnosis includes The differential diagnosis of weakness includes but is not limited to neurologic causes (GBS, myasthenia gravis, CVA, MS, ALS, transverse myelitis, spinal cord injury, CVA, botulism, ) and other causes: ACS, Arrhythmia, syncope, orthostatic hypotension, sepsis, hypoglycemia, electrolyte disturbance, hypothyroidism, respiratory failure, symptomatic anemia, dehydration, heat injury, polypharmacy, malignancy.   Co morbidities: Discussed in HPI   Brief History:  Crockett Rallo Agner is a 61 y.o. male with medical history significant of hypertension, dilated cardiomyopathy, chronic combined systolic and diastolic CHF, and GERD presents due to weakness some lightheadedness fatigue nausea.  Seems the patient was drinking beers last night and began feeling quite weak he states that he felt lightheaded but on further questioning seems to describe this as generalized weakness.  He denies any focal weakness of any upper or lower extremity.  He denies any syncope near syncope or head injury.  He denies any recreational drug use denies any chest pain.  States he did have some mild shortness of breath but that seems to have improved now.  He was given 4 mg of IV Zofran by EMS with relief of his nausea and is not currently nauseous and he has not experienced any vomiting.  Last alcohol intake was 10 PM last night.  Seems that he drinks 8 beers a day.  He states this is a dramatic decrease from what he used to drink.  He states he no longer drinks liquor.  Was diagnosed with Warnicke's  during prior admission 3 months ago.  He states that he is still taking his Entresto for his CHF.    EMR reviewed including pt PMHx, past surgical  history and past visits to ER.   See HPI for more details   Lab Tests:   I ordered and independently interpreted labs. Labs notable for significant hyponatremia of 126.  He appears somewhat dry on exam and colitis 92.  There is no AKI present and creatinine is within normal limits.  CBC without leukocytosis or anemia.  Troponin x2 within normal limits.  BnP unremarkable.   Imaging Studies:  NAD. I personally reviewed all imaging studies and no acute abnormality found. I agree with radiology interpretation.    Cardiac Monitoring:  NA EKG non-ischemic   Medicines ordered:  I ordered medication including normal saline  for dehydration and hyponatremia.  Reevaluation of the patient after these medicines showed that the patient improved I have reviewed the patients home medicines and have made adjustments as needed   Critical Interventions:     Consults/Attending Physician   I discussed this case with my attending physician who cosigned this note including patient's presenting symptoms, physical exam, and planned diagnostics and interventions. Attending physician stated agreement with plan or made changes to plan which were implemented.   Attending physician assessed patient at bedside.    Discussed with Dr. Tamala Julian who will admit.   Reevaluation:  After the interventions noted above I re-evaluated patient and found that they have :improved   Social Determinants of Health:      Problem List / ED Course:  Nausea now resolved Hyponatremia likely due to beer [place] mania although counterintuitively patient looks somewhat dry is experiencing some fatigue that may be due to intravascular depletion.  We will gently rehydrate.  Entresto use may also be contributing to patient's hyponatremia. Alcohol use -continues to drink.   Last drink 10 PM.  Approximately 8 beers per day   Dispostion:  After consideration of the diagnostic results and the patients response to treatment, I feel that the patent would benefit from admission to the hospital.  He does appear volume down and gentle hydration would likely benefit him.     Final Clinical Impression(s) / ED Diagnoses Final diagnoses:  Hyponatremia  Alcohol use    Rx / DC Orders ED Discharge Orders     None         Tedd Sias, [place] 11/04/21 1011    Ezequiel Essex, MD 11/05/21 [phone]

## 2021-11-04 NOTE — Evaluation (Signed)
Occupational Therapy Evaluation Patient Details Name: Luis Porter Monday MRN: [id number] DOB: [birth year] Today's Date: 11/04/2021   History of Present Illness 61 y.o. M admitted on 11/03/21 due to dizziness and nausea, as well as the past week of mild SOB, sweating, sluggish, and chest discomfort. PMH significant for  hypertension, systolic CHF last EF noted to be 35%, alcohol abuse, and recurrent umbilical hernia status post repair.   Clinical Impression   Pt admitted for concerns listed above. PTA pt reported that he was independent with all ADL's and functional mobility, using no AD. At this time, pt presents near his baseline with no concerns or deficits noted. He was able to complete BADL's and functional mobility with no AD. Pt has no further skilled OT needs and acute OT will sign off.       Recommendations for follow up therapy are one component of a multi-disciplinary discharge planning process, led by the attending physician.  Recommendations may be updated based on patient status, additional functional criteria and insurance authorization.   Follow Up Recommendations  No OT follow up    Assistance Recommended at Discharge None  Patient can return home with the following      Functional Status Assessment  Patient has had a recent decline in their functional status and demonstrates the ability to make significant improvements in function in a reasonable and predictable amount of time.  Equipment Recommendations  None recommended by OT    Recommendations for Other Services       Precautions / Restrictions Precautions Precautions: None Restrictions Weight Bearing Restrictions: No      Mobility Bed Mobility Overal bed mobility: Modified Independent                  Transfers Overall transfer level: Independent Equipment used: None                      Balance Overall balance assessment: No apparent balance deficits (not formally assessed)                                          ADL either performed or assessed with clinical judgement   ADL Overall ADL's : Modified independent;At baseline                                       General ADL Comments: No concerns, pt at/near baseline     Vision Baseline Vision/History: 0 No visual deficits Ability to See in Adequate Light: 0 Adequate Patient Visual Report: No change from baseline Vision Assessment?: No apparent visual deficits     Perception     Praxis      Pertinent Vitals/Pain Pain Assessment Pain Assessment: No/denies pain     Hand Dominance Right   Extremity/Trunk Assessment Upper Extremity Assessment Upper Extremity Assessment: Overall WFL for tasks assessed   Lower Extremity Assessment Lower Extremity Assessment: Overall WFL for tasks assessed   Cervical / Trunk Assessment Cervical / Trunk Assessment: Normal   Communication Communication Communication: No difficulties   Cognition Arousal/Alertness: Awake/alert Behavior During Therapy: WFL for tasks assessed/performed Overall Cognitive Status: Within Functional Limits for tasks assessed  General Comments  VSS on RA    Exercises     Shoulder Instructions      Home Living Family/patient expects to be discharged to:: Private residence Living Arrangements: Alone Available Help at Discharge: Friend(s);Available PRN/intermittently Type of Home: House Home Access: Stairs to enter [employer] of Steps: 5 Entrance Stairs-Rails: Right;Left Home Layout: Two level Alternate Level Stairs-Number of Steps: full flight Alternate Level Stairs-Rails: Left Bathroom Shower/Tub: Teacher, early years/pre: Standard     Home Equipment: None          Prior Functioning/Environment Prior Level of Function : Independent/Modified Independent             Mobility Comments: no AD ADLs Comments: Pt works  with heavy Librarian, academic        OT Problem List: Decreased strength;Decreased activity tolerance;Impaired balance (sitting and/or standing)      OT Treatment/Interventions:      OT Goals(Current goals can be found in the care plan section) Acute Rehab OT Goals Patient Stated Goal: To go home OT Goal Formulation: All assessment and education complete, DC therapy Time For Goal Achievement: 11/04/21 Potential to Achieve Goals: Good  OT Frequency:      Co-evaluation              AM-PAC OT "6 Clicks" Daily Activity     Outcome Measure Help from another person eating meals?: None Help from another person taking care of personal grooming?: None Help from another person toileting, which includes using toliet, bedpan, or urinal?: None Help from another person bathing (including washing, rinsing, drying)?: None Help from another person to put on and taking off regular upper body clothing?: None Help from another person to put on and taking off regular lower body clothing?: None 6 Click Score: 24   End of Session Nurse Communication: Mobility status  Activity Tolerance: Patient tolerated treatment well Patient left: in bed;with call bell/phone within reach  OT Visit Diagnosis: Muscle weakness (generalized) (M62.81);Unsteadiness on feet (R26.81)                Time: 1532-0100 OT Time Calculation (min): 568 min Charges:  OT General Charges $OT Visit: 1 Visit OT Evaluation $OT Eval Low Complexity: [place]., OTR/L Acute Rehabilitation  Zachary Lovins Elane Yolanda Bonine 11/04/2021, 6:55 PM

## 2021-11-05 DIAGNOSIS — R051 Acute cough: Secondary | ICD-10-CM | POA: Diagnosis not present

## 2021-11-05 DIAGNOSIS — I10 Essential (primary) hypertension: Secondary | ICD-10-CM | POA: Diagnosis not present

## 2021-11-05 DIAGNOSIS — E871 Hypo-osmolality and hyponatremia: Secondary | ICD-10-CM | POA: Diagnosis not present

## 2021-11-05 DIAGNOSIS — F1010 Alcohol abuse, uncomplicated: Secondary | ICD-10-CM | POA: Diagnosis not present

## 2021-11-05 LAB — BASIC METABOLIC PANEL
Anion gap: 6 (ref 5–15)
BUN: 5 mg/dL — ABNORMAL LOW (ref 6–20)
CO2: 23 mmol/L (ref 22–32)
Calcium: 8.8 mg/dL — ABNORMAL LOW (ref 8.9–10.3)
Chloride: 103 mmol/L (ref 98–111)
Creatinine, Ser: 0.63 mg/dL (ref 0.61–1.24)
GFR, Estimated: >60 mL/min (ref >60)
Glucose, Bld: 94 mg/dL (ref 70–99)
Potassium: 3.9 mmol/L (ref 3.5–5.1)
Sodium: 132 mmol/L — ABNORMAL LOW (ref 135–145)

## 2021-11-05 NOTE — Progress Notes (Signed)
PT Cancellation Note  Patient Details Name: Luis Porter MRN: [id number] DOB: 1961/05/01   Cancelled Treatment:    Reason Eval/Treat Not Completed: PT screened, no needs identified, will sign off (per OT pt independent, at baseline without further needs. will sign off)   Viktoria Gruetzmacher B Elaisha Zahniser 11/05/2021, 7:14 AM Bayard Males, PT Acute Rehabilitation Services Office: [phone]

## 2021-11-05 NOTE — Discharge Summary (Signed)
Physician Discharge Summary   Patient: Luis Porter Due MRN: [id number] DOB: [birth year]  Admit date:     11/03/2021  Discharge date: 11/05/21  Discharge Physician: Patrecia Pour   PCP: Olin Hauser, DO   Recommendations at discharge:  Continue alcohol moderation counseling Recheck CMP*** suggested.  Discharge Diagnoses: Principal Problem:   Hyponatremia Active Problems:   Near syncope   Alcohol abuse   Cough   Heart failure with reduced ejection fraction (HCC)   Essential hypertension   GERD (gastroesophageal reflux disease)  Hospital Course: No notes on file  Assessment and Plan: No notes have been filed under this hospital service. Service: Hospitalist     {Tip this will not be part of the note when signed Body mass index is 24.41 kg/m. , ,  (Optional):26781}  {(NOTE) Pain control PDMP Statment (Optional):26782} Consultants: *** Procedures performed: ***  Disposition: {Plan; Disposition:26390} Diet recommendation:  {Diet_Plan:26776} DISCHARGE MEDICATION: Allergies as of 11/05/2021       Reactions   Lotensin [benazepril] Swelling, Other (See Comments)   Angioedema   Vivitrol [naltrexone] Other (See Comments)   Made the patient "feel angry"   Nsaids Other (See Comments)   Due to history of ulcers, should not have this class of medication     Med Rec must be completed prior to using this [facility]***       Discharge Exam: Filed Weights   11/04/21 1000 11/05/21 0614  Weight: 70.3 kg 70.7 kg   ***  Condition at discharge: {DC Condition:26389}  The results of significant diagnostics from this hospitalization (including imaging, microbiology, ancillary and laboratory) are listed below for reference.   Imaging Studies: DG Chest 2 View  Result Date: 11/04/2021 CLINICAL DATA:  History of CHF EXAM: CHEST - 2 VIEW COMPARISON:  08/13/2021 FINDINGS: The heart size and mediastinal contours are within normal limits. Aortic atherosclerosis. Both  lungs are clear. The visualized skeletal structures are unremarkable. IMPRESSION: No active cardiopulmonary disease. Electronically Signed   By: Donavan Foil M.D.   On: 11/04/2021 00:02    Microbiology: Results for orders placed or performed during the hospital encounter of 08/13/21  Resp Panel by RT-PCR (Flu A&B, Covid) Nasopharyngeal Swab     Status: None   Collection Time: 08/13/21  1:58 PM   Specimen: Nasopharyngeal Swab; Nasopharyngeal(NP) swabs in vial transport medium  Result Value Ref Range Status   SARS Coronavirus 2 by RT PCR NEGATIVE NEGATIVE Final    Comment: (NOTE) SARS-CoV-2 target nucleic acids are NOT DETECTED.  The SARS-CoV-2 RNA is generally detectable in upper respiratory specimens during the acute phase of infection. The lowest concentration of SARS-CoV-2 viral copies this assay can detect is 138 copies/mL. A negative result does not preclude SARS-Cov-2 infection and should not be used as the sole basis for treatment or other patient management decisions. A negative result may occur with  improper specimen collection/handling, submission of specimen other than nasopharyngeal swab, presence of viral mutation(s) within the areas targeted by this assay, and inadequate number of viral copies(<138 copies/mL). A negative result must be combined with clinical observations, patient history, and epidemiological information. The expected result is Negative.  Fact Sheet for Patients:  EntrepreneurPulse.com.au  Fact Sheet for Healthcare Providers:  IncredibleEmployment.be  This test is no t yet approved or cleared by the [place] FDA and  has been authorized for detection and/or diagnosis of SARS-CoV-2 by FDA under an Emergency Use Authorization (EUA). This EUA will remain  in effect (meaning this test can be  used) for the duration of the COVID-19 declaration under Section 564(b)(1) of the Act, 21 U.S.C.section 360bbb-3(b)(1),  unless the authorization is terminated  or revoked sooner.       Influenza A by PCR NEGATIVE NEGATIVE Final   Influenza B by PCR NEGATIVE NEGATIVE Final    Comment: (NOTE) The Xpert Xpress SARS-CoV-2/FLU/RSV plus assay is intended as an aid in the diagnosis of influenza from Nasopharyngeal swab specimens and should not be used as a sole basis for treatment. Nasal washings and aspirates are unacceptable for Xpert Xpress SARS-CoV-2/FLU/RSV testing.  Fact Sheet for Patients: EntrepreneurPulse.com.au  Fact Sheet for Healthcare Providers: IncredibleEmployment.be  This test is not yet approved or cleared by the [place] FDA and has been authorized for detection and/or diagnosis of SARS-CoV-2 by FDA under an Emergency Use Authorization (EUA). This EUA will remain in effect (meaning this test can be used) for the duration of the COVID-19 declaration under Section 564(b)(1) of the Act, 21 U.S.C. section 360bbb-3(b)(1), unless the authorization is terminated or revoked.  Performed at [facility], [place] [address] [place], [place] [address]     Labs: CBC: Recent Labs  Lab 11/03/21 2349  WBC 10.4  NEUTROABS 5.4  HGB 14.3  HCT 40.1  MCV 95.7  PLT 604   Basic Metabolic Panel: Recent Labs  Lab 11/03/21 2349 11/04/21 1605  NA 126* 132*  K 3.8 4.1  CL 92* 98  CO2 24 24  GLUCOSE 103* 110*  BUN <5* 6  CREATININE 0.55* 0.72  CALCIUM 9.2 9.0  MG  --  1.9  PHOS  --  4.0   Liver Function Tests: Recent Labs  Lab 11/03/21 2349  AST 19  ALT 11  ALKPHOS 43  BILITOT 0.7  PROT 6.8  ALBUMIN 3.7   CBG: No results for input(s): "GLUCAP" in the last 168 hours.  Discharge time spent: {LESS THAN/GREATER VWUJ:81191} 30 minutes.  Signed: Patrecia Pour, MD [facility] 11/05/2021

## 2021-11-05 NOTE — Progress Notes (Signed)
Pt safely discharged. Discharge packet provided with teach-back method. VS wnL and as per flow. IVs removed, Pt verbalized understanding. FMLA addressed & work letter provided. All questions and concerns addressed.

## 2021-11-09 ENCOUNTER — Ambulatory Visit: Payer: Commercial Managed Care - PPO | Admitting: Family Medicine

## 2021-11-10 ENCOUNTER — Encounter: Payer: Self-pay | Admitting: Family Medicine

## 2021-11-10 ENCOUNTER — Ambulatory Visit: Payer: Commercial Managed Care - PPO | Admitting: Family Medicine

## 2021-11-10 VITALS — BP 120/59 | HR 72 | Ht 67.0 in | Wt 168.4 lb

## 2021-11-10 DIAGNOSIS — F1020 Alcohol dependence, uncomplicated: Secondary | ICD-10-CM | POA: Insufficient documentation

## 2021-11-10 DIAGNOSIS — I5020 Unspecified systolic (congestive) heart failure: Secondary | ICD-10-CM | POA: Diagnosis not present

## 2021-11-10 DIAGNOSIS — E871 Hypo-osmolality and hyponatremia: Secondary | ICD-10-CM | POA: Diagnosis not present

## 2021-11-10 NOTE — Progress Notes (Signed)
Subjective:    Patient ID: Luis Porter, male    DOB: February 13, 1961, 61 y.o.   MRN: [id number]  Amitai Delaughter Bourget is a 61 y.o. male presenting on 11/10/2021 for Hospitalization Follow-up and Alcohol Problem   HPI  HOSPITAL FOLLOW-UP VISIT  Hospital/Location: [facility] [place] Date of Admission: 11/03/21 Date of Discharge: 11/05/21 Transitions of care telephone call: Not completed.  Reason for Admission: near syncope, hyponatremia, alcohol  - Hospital H&P and Discharge Summary have been reviewed - Patient presents today 5 days after recent hospitalization. Brief summary of recent course, patient had symptoms of sudden episode lightheaded, weak near syncope, dyspnea, nausea, hospitalized, he has had prior hospitalization 07/2021 and now reduced amount of alcohol. At this time recently drinking about 8 beers per day on avg. Also drinks 64 oz of water most days. Labs showed hyponatremia Na 126, creatinine 0.55, cardiac work up negative, EKG included.  Dx Beer potomania, fluid imbalance, improved w/ Isotonic Fluid.   HFrEF / last EF 35% in 03/2021.  Sodium was low . He has done less water instead of the 64 oz and trying to add electrolyte. He is taking Entresto / Spironolactone '25mg'$  Needs repeat CMET vs BMET   - Today reports overall has done well after discharge. Symptoms have improved  I have reviewed the discharge medication list, and have reconciled the current and discharge medications today.   Current Outpatient Medications:    albuterol (VENTOLIN HFA) 108 (90 Base) MCG/ACT inhaler, INHALE 1-2 PUFFS INTO THE LUNGS EVERY 4 (FOUR) HOURS AS NEEDED FOR WHEEZING OR SHORTNESS OF BREATH. (Patient taking differently: Inhale 2 puffs into the lungs every 4 (four) hours as needed for wheezing or shortness of breath.), Disp: 6.7 each, Rfl: 1   fluticasone (FLONASE) 50 MCG/ACT nasal spray, PLACE 2 SPRAYS INTO BOTH NOSTRILS DAILY. USE FOR 4-6 WEEKS THEN STOP AND USE SEASONALLY OR AS NEEDED.  (Patient taking differently: Place 2 sprays into both nostrils daily as needed for allergies.), Disp: 48 mL, Rfl: 1   folic acid (FOLVITE) 1 MG tablet, Take 1 tablet (1 mg total) by mouth daily., Disp: 90 tablet, Rfl: 3   Glycerin-Hypromellose-PEG 400 (VISINE DRY EYE OP), Place 1 drop into both eyes daily as needed (dry eyes)., Disp: , Rfl:    hydrOXYzine (ATARAX) 25 MG tablet, Take 1 tablet (25 mg total) by mouth every 6 (six) hours as needed for anxiety (or CIWA score </= 10)., Disp: 30 tablet, Rfl: 0   loratadine (CLARITIN) 10 MG tablet, Take 10 mg by mouth daily as needed for allergies or rhinitis., Disp: , Rfl:    meclizine (ANTIVERT) 25 MG tablet, Take 25 mg by mouth 3 (three) times daily as needed for dizziness., Disp: , Rfl:    metoprolol succinate (TOPROL-XL) 100 MG 24 hr tablet, Take 1 tablet (100 mg total) by mouth daily. Take with or immediately following a meal., Disp: 30 tablet, Rfl: 3   multivitamin (ONE-A-DAY MEN'S) TABS tablet, Take 1 tablet by mouth daily with breakfast., Disp: , Rfl:    omeprazole (PRILOSEC) 40 MG capsule, TAKE 1 CAPSULE BY MOUTH TWICE A DAY (Patient taking differently: Take 40 mg by mouth 2 (two) times daily.), Disp: 180 capsule, Rfl: 1   ondansetron (ZOFRAN) 4 MG tablet, Take 4 mg by mouth every 6 (six) hours as needed for nausea., Disp: , Rfl:    sacubitril-valsartan (ENTRESTO) 24-26 MG, Take 1 tablet by mouth 2 (two) times daily., Disp: 180 tablet, Rfl: 2   spironolactone (  ALDACTONE) 25 MG tablet, Take 1 tablet (25 mg total) by mouth daily., Disp: 30 tablet, Rfl: 2   thiamine 100 MG tablet, Take 1 tablet (100 mg total) by mouth daily., Disp: 90 tablet, Rfl: 3   traZODone (DESYREL) 50 MG tablet, Take 1 tablet (50 mg total) by mouth at bedtime., Disp: 30 tablet, Rfl: 2  ------------------------------------------------------------------------- Social History   Tobacco Use   Smoking status: Former    Packs/day: 2.00    Years: 25.00    Total pack years: 50.00     Types: Cigarettes    Quit date: 05/31/1987    Years since quitting: 34.4   Smokeless tobacco: Never  Vaping Use   Vaping Use: Never used  Substance Use Topics   Alcohol use: Yes    Alcohol/week: 14.0 standard drinks of alcohol    Types: 14 Cans of beer per week    Comment: former usage. States he drinks around 2 beer a day   Drug use: No    Review of Systems Per HPI unless specifically indicated above     Objective:    BP (!) 120/59   Pulse 72   Ht '5\' 7"'$  (1.702 m)   Wt 168 lb 6.4 oz (76.4 kg)   SpO2 98%   BMI 26.38 kg/m   Wt Readings from Last 3 Encounters:  11/10/21 168 lb 6.4 oz (76.4 kg)  11/05/21 155 lb 13.8 oz (70.7 kg)  09/15/21 155 lb (70.3 kg)    Physical Exam Vitals and nursing note reviewed.  Constitutional:      General: He is not in acute distress.    Appearance: He is well-developed. He is not diaphoretic.     Comments: Well-appearing, comfortable, cooperative  HENT:     Head: Normocephalic and atraumatic.  Eyes:     General:        Right eye: No discharge.        Left eye: No discharge.     Conjunctiva/sclera: Conjunctivae normal.  Neck:     Thyroid: No thyromegaly.  Cardiovascular:     Rate and Rhythm: Normal rate and regular rhythm.     Pulses: Normal pulses.     Heart sounds: Normal heart sounds. No murmur heard. Pulmonary:     Effort: Pulmonary effort is normal. No respiratory distress.     Breath sounds: Normal breath sounds. No wheezing or rales.  Musculoskeletal:        General: Normal range of motion.     Cervical back: Normal range of motion and neck supple.  Lymphadenopathy:     Cervical: No cervical adenopathy.  Skin:    General: Skin is warm and dry.     Findings: No erythema or rash.  Neurological:     Mental Status: He is alert and oriented to person, place, and time. Mental status is at baseline.  Psychiatric:        Behavior: Behavior normal.     Comments: Well groomed, good eye contact, normal speech and thoughts       Results for orders placed or performed during the hospital encounter of 11/03/21  CBC with Differential  Result Value Ref Range   WBC 10.4 4.0 - 10.5 K/uL   RBC 4.19 (L) 4.22 - 5.81 MIL/uL   Hemoglobin 14.3 13.0 - 17.0 g/dL   HCT 40.1 39.0 - 52.0 %   MCV 95.7 80.0 - 100.0 fL   MCH 34.1 (H) 26.0 - 34.0 pg   MCHC 35.7 30.0 - 36.0 g/dL  RDW 11.9 11.5 - 15.5 %   Platelets 310 150 - 400 K/uL   nRBC 0.0 0.0 - 0.2 %   Neutrophils Relative % 51 %   Neutro Abs 5.4 1.7 - 7.7 K/uL   Lymphocytes Relative 32 %   Lymphs Abs 3.3 0.7 - 4.0 K/uL   Monocytes Relative 11 %   Monocytes Absolute 1.1 (H) 0.1 - 1.0 K/uL   Eosinophils Relative 4 %   Eosinophils Absolute 0.4 0.0 - 0.5 K/uL   Basophils Relative 1 %   Basophils Absolute 0.1 0.0 - 0.1 K/uL   Immature Granulocytes 1 %   Abs Immature Granulocytes 0.05 0.00 - 0.07 K/uL  Comprehensive metabolic panel  Result Value Ref Range   Sodium 126 (L) 135 - 145 mmol/L   Potassium 3.8 3.5 - 5.1 mmol/L   Chloride 92 (L) 98 - 111 mmol/L   CO2 24 22 - 32 mmol/L   Glucose, Bld 103 (H) 70 - 99 mg/dL   BUN <5 (L) 6 - 20 mg/dL   Creatinine, Ser 0.55 (L) 0.61 - 1.24 mg/dL   Calcium 9.2 8.9 - 10.3 mg/dL   Total Protein 6.8 6.5 - 8.1 g/dL   Albumin 3.7 3.5 - 5.0 g/dL   AST 19 15 - 41 U/L   ALT 11 0 - 44 U/L   Alkaline Phosphatase 43 38 - 126 U/L   Total Bilirubin 0.7 0.3 - 1.2 mg/dL   GFR, Estimated >60 >60 mL/min   Anion gap 10 5 - 15  Brain natriuretic peptide  Result Value Ref Range   B Natriuretic Peptide 81.1 0.0 - 100.0 pg/mL  Ethanol  Result Value Ref Range   Alcohol, Ethyl (B) <10 <10 mg/dL  Basic metabolic panel  Result Value Ref Range   Sodium 132 (L) 135 - 145 mmol/L   Potassium 4.1 3.5 - 5.1 mmol/L   Chloride 98 98 - 111 mmol/L   CO2 24 22 - 32 mmol/L   Glucose, Bld 110 (H) 70 - 99 mg/dL   BUN 6 6 - 20 mg/dL   Creatinine, Ser 0.72 0.61 - 1.24 mg/dL   Calcium 9.0 8.9 - 10.3 mg/dL   GFR, Estimated >60 >60 mL/min   Anion  gap 10 5 - 15  Magnesium  Result Value Ref Range   Magnesium 1.9 1.7 - 2.4 mg/dL  Phosphorus  Result Value Ref Range   Phosphorus 4.0 2.5 - 4.6 mg/dL  D-dimer, quantitative  Result Value Ref Range   D-Dimer, Quant <0.27 0.00 - 0.50 ug/mL-FEU  Basic metabolic panel  Result Value Ref Range   Sodium 132 (L) 135 - 145 mmol/L   Potassium 3.9 3.5 - 5.1 mmol/L   Chloride 103 98 - 111 mmol/L   CO2 23 22 - 32 mmol/L   Glucose, Bld 94 70 - 99 mg/dL   BUN 5 (L) 6 - 20 mg/dL   Creatinine, Ser 0.63 0.61 - 1.24 mg/dL   Calcium 8.8 (L) 8.9 - 10.3 mg/dL   GFR, Estimated >60 >60 mL/min   Anion gap 6 5 - 15  Troponin I (High Sensitivity)  Result Value Ref Range   Troponin I (High Sensitivity) 5 <18 ng/L  Troponin I (High Sensitivity)  Result Value Ref Range   Troponin I (High Sensitivity) 6 <18 ng/L      Assessment & Plan:   Problem List Items Addressed This Visit     Uncomplicated alcohol dependence ([place])   Hyponatremia - Primary   Relevant Orders  BASIC METABOLIC PANEL WITH GFR   Heart failure with reduced ejection fraction ([place])     Lab to check Sodium.  Limit plain fluid intake < 40-50 oz per day, ideally more of the fluid should have electrolytes in it.  Continue to work with reducing alcohol gradually. Alcohol cessation  Followed by Cardiology, future assess ECHO again with HFrEF Continue diuertics  Needs FMLA continuous FMLA for hospitalization.  Intermittent FMLA 3 times per 1 month, with 1-3 days per episode.  3 apt in 3 months intermittent. 1 day per    No orders of the defined types were placed in this encounter.   Follow up plan: Return if symptoms worsen or fail to improve.   Nobie Putnam, DO [facility] 11/10/2021, 11:22 AM

## 2021-11-10 NOTE — Patient Instructions (Addendum)
Thank you for coming to the office today.  Lab to check Sodium.  Limit plain fluid intake < 40-50 oz per day, ideally more of the fluid should have electrolytes in it.  Continue to work with reducing alcohol gradually.  FMLA Continuous while in hospital. And intermittent for future issues related, would do up to 3 episodes per 1 month, with 1-3 days per episode, and 3 doctors visits per 3 months, with 1 day per visit.  Please schedule a Follow-up Appointment to: Return if symptoms worsen or fail to improve.  If you have any other questions or concerns, please feel free to call the office or send a message through [place]. You may also schedule an earlier appointment if necessary.  Additionally, you may be receiving a survey about your experience at our office within a few days to 1 week by e-mail or mail. We value your feedback.  Nobie Putnam, DO [place]

## 2021-11-11 LAB — BASIC METABOLIC PANEL WITH GFR
BUN/Creatinine Ratio: 13 (calc) (ref 6–22)
BUN: 9 mg/dL (ref 7–25)
CO2: 23 mmol/L (ref 20–32)
Calcium: 9.3 mg/dL (ref 8.6–10.3)
Chloride: 98 mmol/L (ref 98–110)
Creat: 0.67 mg/dL — ABNORMAL LOW (ref 0.70–1.35)
Glucose, Bld: 99 mg/dL (ref 65–139)
Potassium: 4.4 mmol/L (ref 3.5–5.3)
Sodium: 132 mmol/L — ABNORMAL LOW (ref 135–146)
eGFR: 107 mL/min/{1.73_m2} (ref >=60)

## 2021-12-01 ENCOUNTER — Other Ambulatory Visit: Payer: Self-pay

## 2021-12-03 ENCOUNTER — Other Ambulatory Visit: Payer: Self-pay | Admitting: Family Medicine

## 2021-12-03 DIAGNOSIS — I5042 Chronic combined systolic (congestive) and diastolic (congestive) heart failure: Secondary | ICD-10-CM

## 2021-12-03 MED ORDER — METOPROLOL SUCCINATE ER 100 MG PO TB24: 100.0000 mg | ORAL_TABLET | Freq: Every day | ORAL | 1 refills | Status: AC

## 2021-12-03 MED ORDER — SPIRONOLACTONE 25 MG PO TABS: 25.0000 mg | ORAL_TABLET | Freq: Every day | ORAL | 1 refills | Status: AC

## 2021-12-03 NOTE — Telephone Encounter (Signed)
Requested medication (s) are due for refill today: yes  Requested medication (s) are on the active medication list: yes  Last refill:  08/22/21  Future visit scheduled: no  Notes to clinic:  Unable to refill per protocol, last refill by another provider. Routing for approval.     Requested Prescriptions  Pending Prescriptions Disp Refills   metoprolol succinate (TOPROL-XL) 100 MG 24 hr tablet 30 tablet 3    Sig: Take 1 tablet (100 mg total) by mouth daily. Take with or immediately following a meal.     Cardiovascular:  Beta Blockers Passed - 12/03/2021  8:28 AM      Passed - Last BP in normal range    BP Readings from Last 1 Encounters:  11/10/21 (!) 120/59         Passed - Last Heart Rate in normal range    Pulse Readings from Last 1 Encounters:  11/10/21 72         Passed - Valid encounter within last 6 months    Recent Outpatient Visits           3 weeks ago Hyponatremia   [place], DO   3 months ago Alcohol withdrawal syndrome with complication [facility])   [facility] Olin Hauser, DO   6 months ago Alcohol withdrawal syndrome with complication [facility])   [facility], [name], DO   9 months ago Alcohol withdrawal syndrome with complication [facility])   [facility] Olin Hauser, DO   10 months ago Dilated cardiomyopathy [facility])   [facility], [name], DO               spironolactone (ALDACTONE) 25 MG tablet 30 tablet 2    Sig: Take 1 tablet (25 mg total) by mouth daily.     Cardiovascular: Diuretics - Aldosterone Antagonist Failed - 12/03/2021  8:28 AM      Failed - Cr in normal range and within 180 days    Creat  Date Value Ref Range Status  11/10/2021 0.67 (L) 0.70 - 1.35 mg/dL Final   Creatinine, Urine  Date Value Ref Range Status  01/31/2021 17.95 mg/dL Final    Comment:    Performed at [facility], [place] [address], [place], [place] [address]         Failed - Na in normal range and within 180 days    Sodium  Date Value Ref Range Status  11/10/2021 132 (L) 135 - 146 mmol/L Final         Passed - K in normal range and within 180 days    Potassium  Date Value Ref Range Status  11/10/2021 4.4 3.5 - 5.3 mmol/L Final         Passed - eGFR is 30 or above and within 180 days    GFR, Est African American  Date Value Ref Range Status  08/04/2020 114 > OR = 60 mL/min/1.64m Final   GFR, Est Non African American  Date Value Ref Range Status  08/04/2020 98 > OR = 60 mL/min/1.763mFinal   GFR, Estimated  Date Value Ref Range Status  11/05/2021 >60 >60 mL/min Final    Comment:    (NOTE) Calculated using the CKD-EPI Creatinine Equation (2021)    eGFR  Date Value Ref Range Status  11/10/2021 107 > OR = 60 mL/min/1.7352minal    Comment:    The  eGFR is based on the CKD-EPI 2021 equation. To calculate  the new eGFR from a previous Creatinine or Cystatin C result, go to https://www.kidney.org/professionals/ kdoqi/gfr%5Fcalculator          Passed - Last BP in normal range    BP Readings from Last 1 Encounters:  11/10/21 (!) 120/59         Passed - Valid encounter within last 6 months    Recent Outpatient Visits           3 weeks ago Hyponatremia   [place], DO   3 months ago Alcohol withdrawal syndrome with complication [facility])   [place], DO   6 months ago Alcohol withdrawal syndrome with complication [facility])   [place], DO   9 months ago Alcohol withdrawal syndrome with complication [facility])   [place], DO   10 months ago Dilated cardiomyopathy [facility])   [place], [name], DO

## 2021-12-03 NOTE — Telephone Encounter (Signed)
Medication Refill - Medication: metoprolol succinate (TOPROL-XL) 100 MG 24 hr tablet  spironolactone (ALDACTONE) 25 MG tablet  Has the patient contacted their pharmacy? No.  Preferred Pharmacy (with phone number or street name):  [id number] [place] [place]:  [phone] Fax:  [phone]    Has the patient been seen for an appointment in the last year OR does the patient have an upcoming appointment? Yes.    Agent: Please be advised that RX refills may take up to 3 business days. We ask that you follow-up with your pharmacy.  Patient requesting provider to take over prescribing medication  Patient states he is out of medication

## 2021-12-28 DEATH — deceased

## 2022-03-14 ENCOUNTER — Other Ambulatory Visit: Payer: Self-pay | Admitting: Family Medicine

## 2022-03-14 DIAGNOSIS — K219 Gastro-esophageal reflux disease without esophagitis: Secondary | ICD-10-CM
